# Patient Record
Sex: Female | Born: 1945 | Race: White | Hispanic: No | Smoking: Former smoker
Health system: Southern US, Community
[De-identification: ages and names within clinical notes are randomized; demographics above are authoritative.]

## PROBLEM LIST (undated history)

## (undated) DIAGNOSIS — R9439 Abnormal result of other cardiovascular function study: Secondary | ICD-10-CM

## (undated) DIAGNOSIS — I5022 Chronic systolic (congestive) heart failure: Secondary | ICD-10-CM

## (undated) DIAGNOSIS — R404 Transient alteration of awareness: Secondary | ICD-10-CM

## (undated) DIAGNOSIS — I639 Cerebral infarction, unspecified: Secondary | ICD-10-CM

## (undated) DIAGNOSIS — J40 Bronchitis, not specified as acute or chronic: Secondary | ICD-10-CM

## (undated) DIAGNOSIS — M1990 Unspecified osteoarthritis, unspecified site: Secondary | ICD-10-CM

## (undated) DIAGNOSIS — E785 Hyperlipidemia, unspecified: Secondary | ICD-10-CM

## (undated) DIAGNOSIS — F329 Major depressive disorder, single episode, unspecified: Secondary | ICD-10-CM

## (undated) DIAGNOSIS — F32A Depression, unspecified: Secondary | ICD-10-CM

## (undated) DIAGNOSIS — I2510 Atherosclerotic heart disease of native coronary artery without angina pectoris: Secondary | ICD-10-CM

## (undated) DIAGNOSIS — A809 Acute poliomyelitis, unspecified: Secondary | ICD-10-CM

## (undated) DIAGNOSIS — T4145XA Adverse effect of unspecified anesthetic, initial encounter: Secondary | ICD-10-CM

## (undated) DIAGNOSIS — I10 Essential (primary) hypertension: Secondary | ICD-10-CM

## (undated) DIAGNOSIS — G8929 Other chronic pain: Secondary | ICD-10-CM

## (undated) DIAGNOSIS — J189 Pneumonia, unspecified organism: Secondary | ICD-10-CM

## (undated) DIAGNOSIS — R0902 Hypoxemia: Secondary | ICD-10-CM

## (undated) DIAGNOSIS — L0390 Cellulitis, unspecified: Secondary | ICD-10-CM

## (undated) DIAGNOSIS — I219 Acute myocardial infarction, unspecified: Secondary | ICD-10-CM

## (undated) DIAGNOSIS — I509 Heart failure, unspecified: Secondary | ICD-10-CM

## (undated) DIAGNOSIS — IMO0001 Reserved for inherently not codable concepts without codable children: Secondary | ICD-10-CM

## (undated) DIAGNOSIS — K219 Gastro-esophageal reflux disease without esophagitis: Secondary | ICD-10-CM

## (undated) DIAGNOSIS — M549 Dorsalgia, unspecified: Secondary | ICD-10-CM

## (undated) DIAGNOSIS — M792 Neuralgia and neuritis, unspecified: Secondary | ICD-10-CM

## (undated) DIAGNOSIS — T8859XA Other complications of anesthesia, initial encounter: Secondary | ICD-10-CM

## (undated) DIAGNOSIS — I209 Angina pectoris, unspecified: Secondary | ICD-10-CM

## (undated) DIAGNOSIS — I739 Peripheral vascular disease, unspecified: Secondary | ICD-10-CM

## (undated) DIAGNOSIS — N189 Chronic kidney disease, unspecified: Secondary | ICD-10-CM

## (undated) DIAGNOSIS — R0602 Shortness of breath: Secondary | ICD-10-CM

## (undated) HISTORY — PX: DEBRIDEMENT SKIN / SQ / MUSCLE OF TRUNK: SUR393

## (undated) HISTORY — PX: CORONARY ARTERY BYPASS GRAFT: SHX141

## (undated) HISTORY — PX: CYSTECTOMY: SUR359

## (undated) HISTORY — PX: OTHER SURGICAL HISTORY: SHX169

## (undated) HISTORY — PX: PARTIAL HIP ARTHROPLASTY: SHX733

## (undated) HISTORY — PX: JOINT REPLACEMENT: SHX530

---

## 1992-05-07 DIAGNOSIS — I219 Acute myocardial infarction, unspecified: Secondary | ICD-10-CM

## 1992-05-07 HISTORY — DX: Acute myocardial infarction, unspecified: I21.9

## 2000-01-29 ENCOUNTER — Encounter: Payer: Self-pay | Admitting: Specialist

## 2000-01-29 ENCOUNTER — Encounter: Admission: RE | Admit: 2000-01-29 | Discharge: 2000-01-29 | Payer: Self-pay | Admitting: Specialist

## 2001-09-16 ENCOUNTER — Ambulatory Visit (HOSPITAL_BASED_OUTPATIENT_CLINIC_OR_DEPARTMENT_OTHER): Admission: RE | Admit: 2001-09-16 | Discharge: 2001-09-16 | Payer: Self-pay | Admitting: General Surgery

## 2001-09-16 ENCOUNTER — Encounter (INDEPENDENT_AMBULATORY_CARE_PROVIDER_SITE_OTHER): Payer: Self-pay | Admitting: Specialist

## 2002-07-22 ENCOUNTER — Emergency Department (HOSPITAL_COMMUNITY): Admission: EM | Admit: 2002-07-22 | Discharge: 2002-07-22 | Payer: Self-pay | Admitting: Emergency Medicine

## 2002-07-22 ENCOUNTER — Encounter: Payer: Self-pay | Admitting: Emergency Medicine

## 2003-04-08 ENCOUNTER — Emergency Department (HOSPITAL_COMMUNITY): Admission: EM | Admit: 2003-04-08 | Discharge: 2003-04-09 | Payer: Self-pay | Admitting: Emergency Medicine

## 2004-06-20 ENCOUNTER — Emergency Department (HOSPITAL_COMMUNITY): Admission: EM | Admit: 2004-06-20 | Discharge: 2004-06-21 | Payer: Self-pay | Admitting: Emergency Medicine

## 2004-07-17 ENCOUNTER — Emergency Department (HOSPITAL_COMMUNITY): Admission: EM | Admit: 2004-07-17 | Discharge: 2004-07-17 | Payer: Self-pay | Admitting: Emergency Medicine

## 2004-10-18 ENCOUNTER — Encounter: Admission: RE | Admit: 2004-10-18 | Discharge: 2004-10-18 | Payer: Self-pay | Admitting: Specialist

## 2004-10-30 ENCOUNTER — Encounter: Admission: RE | Admit: 2004-10-30 | Discharge: 2004-10-30 | Payer: Self-pay | Admitting: Orthopedic Surgery

## 2004-11-06 ENCOUNTER — Encounter: Admission: RE | Admit: 2004-11-06 | Discharge: 2004-11-06 | Payer: Self-pay | Admitting: Orthopedic Surgery

## 2004-11-14 ENCOUNTER — Ambulatory Visit (HOSPITAL_COMMUNITY): Admission: RE | Admit: 2004-11-14 | Discharge: 2004-11-14 | Payer: Self-pay | Admitting: Orthopedic Surgery

## 2005-07-04 ENCOUNTER — Inpatient Hospital Stay (HOSPITAL_BASED_OUTPATIENT_CLINIC_OR_DEPARTMENT_OTHER): Admission: RE | Admit: 2005-07-04 | Discharge: 2005-07-04 | Payer: Self-pay | Admitting: Cardiovascular Disease

## 2005-09-03 ENCOUNTER — Ambulatory Visit (HOSPITAL_COMMUNITY)
Admission: RE | Admit: 2005-09-03 | Discharge: 2005-09-05 | Payer: Self-pay | Admitting: Thoracic Surgery (Cardiothoracic Vascular Surgery)

## 2005-10-17 ENCOUNTER — Ambulatory Visit
Admission: RE | Admit: 2005-10-17 | Discharge: 2005-10-17 | Payer: Self-pay | Admitting: Thoracic Surgery (Cardiothoracic Vascular Surgery)

## 2005-12-03 ENCOUNTER — Encounter
Admission: RE | Admit: 2005-12-03 | Discharge: 2005-12-03 | Payer: Self-pay | Admitting: Thoracic Surgery (Cardiothoracic Vascular Surgery)

## 2005-12-04 ENCOUNTER — Inpatient Hospital Stay (HOSPITAL_COMMUNITY)
Admission: RE | Admit: 2005-12-04 | Discharge: 2005-12-08 | Payer: Self-pay | Admitting: Thoracic Surgery (Cardiothoracic Vascular Surgery)

## 2006-01-01 ENCOUNTER — Inpatient Hospital Stay (HOSPITAL_COMMUNITY): Admission: AD | Admit: 2006-01-01 | Discharge: 2006-01-18 | Payer: Self-pay | Admitting: Surgery

## 2006-01-08 ENCOUNTER — Ambulatory Visit: Payer: Self-pay | Admitting: Infectious Diseases

## 2006-02-06 ENCOUNTER — Inpatient Hospital Stay (HOSPITAL_COMMUNITY): Admission: EM | Admit: 2006-02-06 | Discharge: 2006-02-10 | Payer: Self-pay | Admitting: Emergency Medicine

## 2006-02-07 ENCOUNTER — Encounter: Payer: Self-pay | Admitting: Vascular Surgery

## 2006-02-07 ENCOUNTER — Encounter: Payer: Self-pay | Admitting: Cardiovascular Disease

## 2009-03-22 ENCOUNTER — Encounter: Admission: RE | Admit: 2009-03-22 | Discharge: 2009-03-22 | Payer: Self-pay | Admitting: Sports Medicine

## 2009-07-05 ENCOUNTER — Encounter: Admission: RE | Admit: 2009-07-05 | Discharge: 2009-07-05 | Payer: Self-pay | Admitting: Sports Medicine

## 2010-01-19 ENCOUNTER — Ambulatory Visit: Payer: Self-pay | Admitting: Cardiovascular Disease

## 2010-01-30 ENCOUNTER — Inpatient Hospital Stay (HOSPITAL_COMMUNITY): Admission: RE | Admit: 2010-01-30 | Discharge: 2010-02-04 | Payer: Self-pay | Admitting: Specialist

## 2010-05-27 ENCOUNTER — Encounter: Payer: Self-pay | Admitting: Physical Therapy

## 2010-05-28 ENCOUNTER — Encounter: Payer: Self-pay | Admitting: Thoracic Surgery (Cardiothoracic Vascular Surgery)

## 2010-05-28 ENCOUNTER — Encounter: Payer: Self-pay | Admitting: Physical Therapy

## 2010-07-19 LAB — GLUCOSE, CAPILLARY: Glucose-Capillary: 223 mg/dL — ABNORMAL HIGH (ref 70–99)

## 2010-07-19 LAB — PROTIME-INR
INR: 2.04 — ABNORMAL HIGH (ref 0.00–1.49)
Prothrombin Time: 23.2 seconds — ABNORMAL HIGH (ref 11.6–15.2)

## 2010-07-20 LAB — URINALYSIS, ROUTINE W REFLEX MICROSCOPIC
Bilirubin Urine: NEGATIVE
Glucose, UA: NEGATIVE mg/dL
Glucose, UA: NEGATIVE mg/dL
Hgb urine dipstick: NEGATIVE
Ketones, ur: NEGATIVE mg/dL
Leukocytes, UA: NEGATIVE
Leukocytes, UA: NEGATIVE
Nitrite: NEGATIVE
Nitrite: NEGATIVE
Protein, ur: NEGATIVE mg/dL
Specific Gravity, Urine: 1.006 (ref 1.005–1.030)
Specific Gravity, Urine: 1.007 (ref 1.005–1.030)
Specific Gravity, Urine: 1.009 (ref 1.005–1.030)
Urobilinogen, UA: 0.2 mg/dL (ref 0.0–1.0)
Urobilinogen, UA: 0.2 mg/dL (ref 0.0–1.0)
pH: 5 (ref 5.0–8.0)

## 2010-07-20 LAB — PROTIME-INR
INR: 0.99 (ref 0.00–1.49)
INR: 1.44 (ref 0.00–1.49)
INR: 2.21 — ABNORMAL HIGH (ref 0.00–1.49)
Prothrombin Time: 13.3 seconds (ref 11.6–15.2)
Prothrombin Time: 24.7 seconds — ABNORMAL HIGH (ref 11.6–15.2)

## 2010-07-20 LAB — URINE MICROSCOPIC-ADD ON

## 2010-07-20 LAB — URIC ACID: Uric Acid, Serum: 4.2 mg/dL (ref 2.4–7.0)

## 2010-07-20 LAB — CBC
HCT: 33.4 % — ABNORMAL LOW (ref 36.0–46.0)
HCT: 33.5 % — ABNORMAL LOW (ref 36.0–46.0)
HCT: 36.4 % (ref 36.0–46.0)
Hemoglobin: 11.1 g/dL — ABNORMAL LOW (ref 12.0–15.0)
MCH: 29.1 pg (ref 26.0–34.0)
MCH: 29.2 pg (ref 26.0–34.0)
MCH: 30 pg (ref 26.0–34.0)
MCHC: 34.8 g/dL (ref 30.0–36.0)
MCV: 86.6 fL (ref 78.0–100.0)
MCV: 89 fL (ref 78.0–100.0)
Platelets: 165 10*3/uL (ref 150–400)
Platelets: 190 10*3/uL (ref 150–400)
RBC: 3.87 MIL/uL (ref 3.87–5.11)
RDW: 13.4 % (ref 11.5–15.5)
RDW: 14 % (ref 11.5–15.5)
WBC: 13.3 10*3/uL — ABNORMAL HIGH (ref 4.0–10.5)
WBC: 8.1 10*3/uL (ref 4.0–10.5)

## 2010-07-20 LAB — GLUCOSE, CAPILLARY
Glucose-Capillary: 103 mg/dL — ABNORMAL HIGH (ref 70–99)
Glucose-Capillary: 103 mg/dL — ABNORMAL HIGH (ref 70–99)
Glucose-Capillary: 114 mg/dL — ABNORMAL HIGH (ref 70–99)
Glucose-Capillary: 115 mg/dL — ABNORMAL HIGH (ref 70–99)
Glucose-Capillary: 141 mg/dL — ABNORMAL HIGH (ref 70–99)
Glucose-Capillary: 182 mg/dL — ABNORMAL HIGH (ref 70–99)
Glucose-Capillary: 194 mg/dL — ABNORMAL HIGH (ref 70–99)
Glucose-Capillary: 218 mg/dL — ABNORMAL HIGH (ref 70–99)
Glucose-Capillary: 273 mg/dL — ABNORMAL HIGH (ref 70–99)
Glucose-Capillary: 89 mg/dL (ref 70–99)

## 2010-07-20 LAB — BASIC METABOLIC PANEL
CO2: 24 mEq/L (ref 19–32)
CO2: 28 mEq/L (ref 19–32)
CO2: 29 mEq/L (ref 19–32)
Calcium: 8.1 mg/dL — ABNORMAL LOW (ref 8.4–10.5)
Calcium: 8.4 mg/dL (ref 8.4–10.5)
Calcium: 8.4 mg/dL (ref 8.4–10.5)
Creatinine, Ser: 0.9 mg/dL (ref 0.4–1.2)
Creatinine, Ser: 0.97 mg/dL (ref 0.4–1.2)
Creatinine, Ser: 1.08 mg/dL (ref 0.4–1.2)
GFR calc Af Amer: >60 mL/min (ref >60)
GFR calc non Af Amer: 58 mL/min — ABNORMAL LOW (ref >60)
Glucose, Bld: 91 mg/dL (ref 70–99)
Sodium: 136 mEq/L (ref 135–145)

## 2010-07-20 LAB — DIFFERENTIAL
Basophils Absolute: 0 10*3/uL (ref 0.0–0.1)
Eosinophils Relative: 2 % (ref 0–5)
Eosinophils Relative: 2 % (ref 0–5)
Lymphocytes Relative: 21 % (ref 12–46)
Lymphocytes Relative: 27 % (ref 12–46)
Lymphs Abs: 1.7 10*3/uL (ref 0.7–4.0)
Lymphs Abs: 2.5 10*3/uL (ref 0.7–4.0)
Monocytes Absolute: 0.7 10*3/uL (ref 0.1–1.0)
Neutro Abs: 5.5 10*3/uL (ref 1.7–7.7)

## 2010-07-20 LAB — URINE CULTURE

## 2010-07-20 LAB — HEMOGLOBIN A1C: Mean Plasma Glucose: 157 mg/dL — ABNORMAL HIGH (ref <117)

## 2010-07-20 LAB — COMPREHENSIVE METABOLIC PANEL
ALT: 16 U/L (ref 0–35)
Calcium: 9.3 mg/dL (ref 8.4–10.5)
Glucose, Bld: 197 mg/dL — ABNORMAL HIGH (ref 70–99)
Sodium: 138 mEq/L (ref 135–145)
Total Protein: 7 g/dL (ref 6.0–8.3)

## 2010-07-20 LAB — SURGICAL PCR SCREEN: MRSA, PCR: NEGATIVE

## 2010-07-26 NOTE — Discharge Summary (Signed)
NAMEJAHARI, [name]               ACCOUNT NO.:  [id number]  MEDICAL RECORD NO.:  [id number]          PATIENT TYPE:  INP  LOCATION:  5001                         FACILITY:  [facility]  PHYSICIAN:  Kerrin Champagne, M.D.   DATE OF BIRTH:  31-Aug-1945  DATE OF ADMISSION:  01/30/2010 DATE OF DISCHARGE:  02/04/2010                              DISCHARGE SUMMARY   ADMISSION DIAGNOSES: 1. Osteoarthritis of the right hip. 2. Hypertension. 3. Coronary artery bypass graft in 2007, followed by staphylococcus     infection and Escherichia coli infection. 4. Chronic obstructive pulmonary disease. 5. Diabetes mellitus.  DISCHARGE DIAGNOSES: 1. Osteoarthritis of the right hip. 2. Hypertension. 3. Coronary artery bypass graft in 2007, followed by staphylococcus     infection and Escherichia coli infection. 4. Chronic obstructive pulmonary disease. 5. Diabetes mellitus. 6. Mild posthemorrhagic anemia.  PROCEDURE:  On January 30, 2010, the patient underwent right total hip arthroplasty using DePuy components.  This performed by Dr. Otelia Sergeant, assisted by Maud Deed, [facility], under general anesthesia.  CONSULTATIONS:  None.  BRIEF HISTORY:  The patient is a 65 year old female who was initially referred to Dr. Otelia Sergeant for concerns regarding her low back pain.  Upon examination, she was noted to have a severe antalgic gait with radiographs demonstrating severe osteoarthritis of the right hip.  She was noted to have severe limitations of range of motion of the right hip and a slight flexion contracture of about 10 degrees.  It was felt that she would benefit from surgical intervention and was admitted for the procedure as stated above.  BRIEF HOSPITAL COURSE:  The patient tolerated the procedure under general anesthesia without complications.  Postoperatively, neurovascular motor function of the lower extremities was noted to be intact.  Hemovac drain was discontinued on the first postoperative  day without difficulty.  Dressing changes were done daily thereafter and the patient's wound was healing without signs of infection.  The patient was placed on Coumadin for DVT prophylaxis.  Adjustments in Coumadin dose made according to daily pro-times by the pharmacist at [facility]. The patient did have some elevated temperatures during the hospital stay.  She was checked with chest x-ray, which showed no acute disease. The patient also underwent urinalysis and urine culture and sensitivity. Urinalysis was negative and the culture did have a final finding of 45,000 colonies of E-coli.  The patient was treated with the usual perioperative antibiotics including Ancef.  PCA medications were used initially for pain control.  The patient did become of over-narcotized and required a dose of Narcan.  Following this, she returned to her normal mental status and was able to be weaned to p.o. analgesics without difficulty.  The patient complained of left shoulder pain.  She was noted to have mild discomfort with range of motion.  Radiographs were negative for fracture.  It was felt that this could possibly be related to gout.  Dr. Otelia Sergeant started the patient on Medrol Dosepak on February 03, 2010.  Her uric acid level was within normal limits.  The patient progressed well with physical therapy.  She was able to ambulate in the hallway,  weightbearing as tolerated.  The patient was taught total hip replacement precautions and was able to demonstrate ability to follow these precautions safely.  She was ambulating over 100 feet in the hallway prior to discharge.  The patient was using a walker.  She received occupational therapy for ADLs and was independent with ADLs prior to discharge.  Other pertinent laboratory values:  Hemoglobin and hematocrit dropped to 11.2 and 33.5 respectively.  Preoperative hemoglobin and hematocrit 14.1 and 40.5.  INR at discharge 2.04. Chemistry studies within normal  limits with the exception of mildly elevated glucoses.  One episode of hyponatremia at 132.  Hemoglobin A1c was 7.1.  Urinalysis negative for urinary tract infection on admission, and on September 27, urinalysis showed 0-2 wbc's, 0-2 rbc's, few bacteria, hyaline cast, granular cast, and mucus present.  PLAN:  The patient was discharged to her home.  Arrangement was made for home health physical therapy for range of motion, stretching/strengthening exercises as well as ambulation.  She will change her dressing daily or as needed.  She will continue on a low- modified carbohydrate diet.  She will be allowed to shower as long as there is no wound drainage.  The patient is instructed to follow up in the office 2 weeks from the date of surgery.  She will continue to follow total hip replacement precautions and will be weightbearing as tolerated on the operative extremity.  The patient was given a medication reconciliation form with instructions to continue her medications as taken prior to admission.  She was given prescription for Percocet 5/325 one-two every 4-6 hours as needed for pain, Coumadin per pharmacy protocol, Medrol Dosepak which she will complete, Robaxin 500 mg one every 8 hours as needed for spasm.  CONDITION ON DISCHARGE:  Stable.  All questions encouraged and answered prior to discharge.     Wende Neighbors, P.A.   ______________________________ Kerrin Champagne, M.D.    SMV/MEDQ  D:  07/11/2010  T:  07/12/2010  Job:  [id number]  Electronically Signed by Maud Deed P.A. on 07/13/2010 09:18:28 AM Electronically Signed by Vira Browns M.D. on 07/26/2010 11:19:39 PM

## 2010-08-17 ENCOUNTER — Inpatient Hospital Stay (INDEPENDENT_AMBULATORY_CARE_PROVIDER_SITE_OTHER)
Admission: RE | Admit: 2010-08-17 | Discharge: 2010-08-17 | Disposition: A | Discharge status: Home / Self Care | Payer: 59 | Source: Ambulatory Visit | Attending: Emergency Medicine | Admitting: Emergency Medicine

## 2010-08-17 DIAGNOSIS — J449 Chronic obstructive pulmonary disease, unspecified: Secondary | ICD-10-CM

## 2010-08-17 LAB — POCT I-STAT, CHEM 8
Chloride: 96 mEq/L (ref 96–112)
Glucose, Bld: 563 mg/dL (ref 70–99)
HCT: 43 % (ref 36.0–46.0)
Hemoglobin: 14.6 g/dL (ref 12.0–15.0)
Potassium: 4.2 mEq/L (ref 3.5–5.1)
Sodium: 132 mEq/L — ABNORMAL LOW (ref 135–145)

## 2010-08-17 LAB — GLUCOSE, CAPILLARY

## 2010-09-22 NOTE — Op Note (Signed)
NAME:  [name], [name]                         ACCOUNT NO.:  [id number]   MEDICAL RECORD NO.:  [id number]                   PATIENT TYPE:  EMS   LOCATION:  MINO                                 FACILITY:  [facility]   PHYSICIAN:  Dionne Ano. Everlene Other, M.D.         DATE OF BIRTH:  29-Jun-1945   DATE OF PROCEDURE:  07/22/2002  DATE OF DISCHARGE:                                 OPERATIVE REPORT   CLINICAL NOTE:  The patient is a very pleasant 65 year old female who was  kindly referred for evaluation of the left thumb.  This patient sustained an  injury when she slammed the left thumb into her Zenaida Niece door at 7:30 a.m. today.  The patient was seen at the [facility] emergency room.  Following  evaluation, she was kindly referred for care.  She has an open distal  phalanx fracture and nail bed laceration and partially-avulsed nail at the  left thumb tip.  Her right thumb is nontender.   She has been given a tetanus shot today.  The patient has a host of  allergies, which we have reviewed, and will place her on a Z-Pak for  antibiotic prophylaxis against infection.   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   PAST SURGICAL HISTORY:  Angioplasty x3.   FAMILY HISTORY:  Heart disease and CVA.   MEDICATIONS:  Aspirin daily.   ALLERGIES:  SULFA, PENICILLIN, and CODEINE.   REVIEW OF SYSTEMS:  Negative.   PHYSICAL EXAMINATION:  GENERAL:  A very pleasant female in no acute  distress.  EXTREMITIES:  The patient has a distal phalanx fracture about the left  thumb.  This is an open fracture with nail bed injury and partial avulsion  of the nail.  The patient's index through small fingers are intact without  signs of infection or distal neurovascular compromise.  The right upper  extremity is neurovascularly intact with normal alignment, stability, and  range of motion.  Lower extremity exam is benign.  HEENT:  Within  normal limits.  CHEST:  Clear.  ABDOMEN:  Nontender, nondistended.   SOCIAL HISTORY:  The patient is divorced with four children.  She is on  disability.  She smokes 1-1/2 packs per day and has done so x30-40 years.  She does not drink alcohol.   LABORATORY DATA:  X-ray showed distal phalanx fracture as well as a middle  phalanx condylar fracture.   IMPRESSION:  Open fracture, left thumb, with nail bed injury.   PLAN:  I verbally consented her for I&D and repair of structures as  necessary.   DESCRIPTION OF PROCEDURE:  The patient was seen by myself and Karie Chimera, P.A.-C.  She then underwent a thorough prep and drape.  Dr. Read Drivers  previously instituted a digital block.  Once this was done, the patient had  a sterile field secured and she underwent I&D of an  open fracture.  This was  an I&D of the skin, subcutaneous tissue, bone, nail bed tissue, and  nonviable prenecrotic tissue.  This was an excisional debridement of the  open fracture.  Following this, the open fracture was reduced and placed in  alignment.  This was done to my satisfaction without difficulty.  Following  this the patient underwent sequential nail bed repair with 6-0 chromic  suture.  The lateral nail folds were repaired with 4-0 chromic.  The patient  tolerated this well, and this was done under tourniquet.  After tourniquet  deflation, the patient had instant refill.  There were no complications.   She was dressed sterilely, Adaptic was placed under the eponychial fold to  prevent nail bed adherence, and the patient tolerated the procedure well.  She was discharged home on a Z-Pak.  She was also given Mepergan Fortis for  pain and Robaxin for muscle spasm.  She will return to see [place] in seven days  in the office, at which time an IP splint will be made, MCP range of motion  will be begun.  She will see [place] back in two weeks for wound check.                                               Dionne Ano. Everlene Other, M.D.    Nash Mantis  D:  07/22/2002  T:  07/23/2002  Job:  [id number]   cc:   Paula Libra, M.D.

## 2010-09-22 NOTE — H&P (Signed)
NAMECATHEY, [name]               ACCOUNT NO.:  [id number]   MEDICAL RECORD NO.:  [id number]            PATIENT TYPE:   LOCATION:                                 FACILITY:   PHYSICIAN:  Theda Belfast, PA DATE OF BIRTH:  10/23/1945   DATE OF ADMISSION:  DATE OF DISCHARGE:                                HISTORY & PHYSICAL   CHIEF COMPLAINT:  Sternal wound infection.   HISTORY OF PRESENT ILLNESS:  Patient is a 65 year old Caucasian female who  is recently status post coronary artery bypass grafting x 4 which was done  by [name] on December 04, 2005.  The patient states she was discharged to home  postoperative day December 08, 2005.  Patient had a pretty minimal  postoperative course.  Today patient states about 10 days ago she noticed  odor coming from her distal sternal wound.  Over the past 2-3 days her  granddaughter had noticed her distal sternal wound had started to separate.  She stated that she had an appointment with her cardiologist and waited  until today, January 01, 2006, for evaluation.  She presented to her  cardiologist who then referred her here to Dr. Sharee Pimple office.  She denies  any fevers, night sweats, chills, purulent drainage from wound site, chest  pain or shortness of breath.   PAST MEDICAL HISTORY:  1. Coronary artery disease.  2. Hypertension.  3. Hyperlipidemia.  4. Diabetes mellitus type 2.  5. Chronic recurrent bronchitis.  6. Longstanding tobacco abuse.  7. Depression.  8. Anxiety.  9. History of cerebrovascular accident.  Questionable.  10.Questionable cervical disk disease, nerve root compression.  11.Degenerative arthritis with chronic right hip and low back pain.  12.Gastroesophageal reflux disease.   PAST SURGICAL HISTORY:  1. Coronary artery bypass grafting x 4 by [name] December 04, 2005.   ALLERGIES:  PENICILLIN, SULFA, CODEINE.   MEDICATIONS:  1. Protonix 40 mg daily.  2. Albuterol inhaler p.r.n.  3. Aspirin 325 mg daily.  4.  Metoprolol 50 mg twice daily.  5. Lisinopril 10 mg daily.  6. Crestor 10 mg at night.  7. Xanax 0.25 mg daily.  8. Glucophage 500 mg.  9. Humalog 75/25 16 units a.m., 6 units noon, 18 units p.m.  10.Lasix 40 mg daily.  11.Potassium chloride 20 mEq daily.  12.Folic acid 1 mg daily.   SOCIAL HISTORY:  The patient has three children, lives at home with her  family.  She has a history of tobacco abuse which she quit about 6 to 8  weeks ago.  The patient resides in [place].   FAMILY HISTORY:  Noncontributory.   REVIEW OF SYSTEMS:  See HPI for pertinent positives and negatives.  Otherwise, patient denies any recent fevers, night sweats or chills.  Denies  any recent changes in vision, hearing, difficulty swallowing.  Denies any  shortness of breath, cough, hemoptysis, wheezing.  Denies any chest pain,  palpitations, __________ dizziness, orthopnea, paroxysmal nocturnal dyspnea.  Denies any nausea, vomiting, abdominal pain, changes in bowel movements,  diarrhea, constipation, melena, hematemesis, hematochezia.  Denies any  urgency frequency, dysuria, hematuria.  Denies any muscle weakness.  Denies  any decrease temperature lower extremities.  Denies any symptoms of TA or  CVA such as amaurosis fugax, muscle weakness, dysphagia, difficulty with  slurred speech.   PHYSICAL EXAMINATION:  GENERAL:  Anxious white female.  VITAL SIGNS:  Blood pressure 162/80, pulse of 76, respiratory rate 18,  temperature 98.3.  HEENT:  Normocephalic and atraumatic.  Pupils equal, round and reactive to  light and accommodation.  Extraocular movements are intact.  Oral mucosa  pink and moist.  NECK:  Supple.  RESPIRATORY:  Clear to auscultation bilaterally.  CARDIAC:  Regular rate and rhythm.  Patient does have a distal sternal wound  dehiscence done to the sutures.  There is purulent drainage noted.  No  cellulitis noted.  This measures about 3 x 2 cm.  ABDOMEN:  Bowel sounds x 4.  Soft and nontender on  palpation.  GENITOURINARY: Deferred.  RECTAL:  Deferred.  EXTREMITIES:  The patient has positive 2+ pitting edema noted lower  extremities.  Bilateral extremities are cool to touch.  She has 2+ bilateral  radial pulses noted.  1+ bilateral femoral and dorsalis pedis and posterior  tibial pulses noted.  Note leg incisions are healing well.  No ulcerations  or skin break down noted.  NEUROLOGIC:  Cranial nerves 2 through 12 are intact.  Patient is alert and  oriented x4.  Gait steady.  Muscle strength 5/5 lower extremities  bilaterally.   IMPRESSION AND PLAN:  The patient is seen with a superficial sternal wound  infection.  She will be admitted to [facility] and started on IV  vancomycin with p.o. ciprofloxacin.  Will check CT scan without contrast for  evaluation of her sternal wound.  Pending CT scan possible debridement with  wound VAC placement tomorrow, January 02, 2006 by Dr. Laneta Simmers.  This was  discussed with the patient.  She acknowledged her understanding and agreed.      Theda Belfast, PA     KMD/MEDQ  D:  01/01/2006  T:  01/01/2006  Job:  [id number]

## 2010-09-22 NOTE — Discharge Summary (Signed)
[name], [name]               ACCOUNT NO.:  [id number]   MEDICAL RECORD NO.:  [id number]           PATIENT TYPE:   LOCATION:                                 FACILITY:   PHYSICIAN:  [name]. [name], M.D. DATE OF BIRTH:  [birth year]   DATE OF ADMISSION:  DATE OF DISCHARGE:                                 DISCHARGE SUMMARY   PRIMARY DIAGNOSIS:  Severe three-vessel coronary artery disease.   IN HOSPITAL DIAGNOSES:  1.  Volume overload.  2.  Bilateral pulmonary effusions.   SECONDARY DIAGNOSES:  1.  History of coronary artery disease with a history of acute myocardial      infarction status post percutaneous coronary intervention.  2.  Hypertension.  3.  Hyperlipidemia.  4.  Diabetes mellitus type 2.  5.  Chronic recurrent bronchitis.  6.  Longstanding tobacco abuse.  7.  Depression.  8.  Anxiety.  9.  Questionable history of previous stroke.  10. Question history of cervical disk disease and/or nerve root compression.  11. Degenerative arthritis with chronic right hip and low back pain.  12. Gastroesophageal reflux disease.   ALLERGIES:  PENICILLIN, SULFA, CODEINE.   IN HOSPITAL OPERATIONS AND PROCEDURES:  1.  Coronary artery bypass grafting x4 using a saphenous vein graft to      distal left anterior descending coronary artery, left internal mammary      artery to ramus intermediate branch, saphenous vein graft to second      circumflex marginal branch, saphenous vein graft to distal right      coronary artery.  Endoscopic saphenous vein harvest from bilateral      thighs.  2.  Intraoperative transesophageal echocardiogram.   HISTORY AND HOSPITAL COURSE:  The patient is a 65 year old obese white  female with history of coronary artery disease status post percutaneous  coronary intervention. She also has a history of hypertension,  hyperlipidemia, diabetes mellitus type 2, longstanding history of tobacco  abuse. She presented with symptoms consistent with angina  pectoris. The  patient underwent cardiac catheterization by [name] demonstrating severe  three-vessel coronary artery disease with mild left ventricular dysfunction  and coronary anatomy unfavorable for percutaneous coronary intervention.  Following catheterization [name] was consulted. [name] saw and evaluated  the patient. He discussed with the patient undergoing coronary artery bypass  grafting. The patient acknowledged understanding and agreed to proceed.  Surgery was scheduled for July31,2007.  Please see dictated H&P for further  History and Physical exam.   The patient was taken to the operating room July31,2007 where she underwent  coronary bypass grafting x4 using saphenous vein graft to distal left  anterior descending coronary artery, left internal mammary artery to ramus  intermediate branch, saphenous vein graft to second circumflex marginal  branch, saphenous vein graft to distal right coronary artery. Endoscopic  saphenous vein harvesting done from bilateral thighs. The patient tolerated  this procedure well and was transferred up to the intensive care unit in  stable condition. Immediately following surgery the patient was seen to be  hemodynamically stable. Later that evening she was extubated and  seen to be  alert and oriented x3.  Neurological intact following extubation. The  patient's postoperative course was pretty much unremarkable. Chest tubes and  lines were DC'd in a normal fashion. She was able to be weaned off dopamine  postop day #1. Systolic blood pressure remained stable. The patient was  transferred out of the intensive care unit on postop day #2. During the  patient's postoperative course she did develop bilateral pulmonary effusions  as well as volume overload. She was started on diuretics at that time.  Follow-up chest x-rays were obtained. Possibly may need thoracentesis if  pulmonary effusions remain. Follow-up chest x-ray will be obtained prior  to  discharge home. The patient was out of bed ambulating well postoperatively.  She was in normal sinus rhythm. Remained in normal sinus rhythm during her  postoperative course. Vital signs are monitored and seen to be stable. She  is afebrile during her postoperative course. The patient was able to be  weaned off oxygen sating greater than 90% on room air with aggressive  pulmonary toilet. The patient remained hemodynamically stable with last  hemoglobin and hematocrit on postop day #3 being 9.9 and 20.5. The patient  does have a history of diabetes mellitus type 2. Postoperatively  blood  sugars were elevated. She was restarted on her Glucophage and Humalog. The  blood sugars will be monitored, and Humalog may need to be increased prior  to discharge if blood sugars remained elevated. The patient's incisions were  clean, dry, intact, and healing well. She was tolerating regular diet well.  No nausea or vomiting noted.   The patient is tentatively ready for discharge home in the next 1-2 days. A  follow-up appointment has been arranged with [name] for September10,2007  at 11:15 a.m. The patient will need to contact Dr. [name] office, and  schedule follow-up appointment with him in two weeks. She will obtain a PA  and lateral chest x-ray at this point. [name] received instructions on  diet, activity level and incisional care.  She was told no driving, no  strenuous activity, no heavy lifting over 10 pounds. The patient is told to  ambulate 3-4 times per day, progress as tolerated. Continue breathing  exercises.   DIET:  Low fat, low salt.   INSTRUCTIONS:  Incisional care, wash incisions with soap and water. Contact  office if she develops any drainage or opening from incision sites.   DISCHARGE MEDICATIONS:  1.  Protonix 40 mg daily.  2.  Albuterol inhaler as needed.  3.  Aspirin 325 mg daily.  4.  Metoprolol 25 mg b.i.d.. 5.  Lisinopril 10 mg daily.  6.  Crestor 10 mg  at night.  7.  Xanax 0.25 mg as used at home.  8.  Glucophage 500 mg b.i.d.  9.  Humalog 75/25 16 units a.m., 16 units at noon, and 18 units p.m..  10. Lasix 40 mg daily times x7 days.  11. Potassium chloride 20 mEq daily x7 days.  12. Folic acid 1 mg daily.  13. Ultram 50 mg 1-2 tablets q. 4-6 hours p.r.n. pain.      Theda Belfast, PA      [name]. [name], M.D.  Electronically Signed    KMD/MEDQ  D:  12/07/2005  T:  12/07/2005  Job:  [id number]

## 2010-09-22 NOTE — H&P (Signed)
NAMEGIANNINA, [name]               ACCOUNT NO.:  [id number]   MEDICAL RECORD NO.:  [id number]          PATIENT TYPE:  OBV   LOCATION:  2015                         FACILITY:  [facility]   PHYSICIAN:  Ulyses Amor, MD DATE OF BIRTH:  March 18, 1946   DATE OF ADMISSION:  02/06/2006  DATE OF DISCHARGE:                                HISTORY & PHYSICAL   HISTORY OF PRESENT ILLNESS:  [name] is a 65 year old white woman who  is admitted to [facility] for what appears to an exacerbation of  congestive heart failure.   The patient has a history of cardiac disease, having undergone coronary  artery bypass surgery in July of this year.  She was found to have three  vessel coronary artery disease and mild left ventricular dysfunction with an  ejection fraction of 45-50%.  Her course was complicated by sternal wound  infection which has required debridement and a PICC-line with antibiotics  which continue to this day.  Her wound is reportedly nearly healed.  The  patient presented to the emergency department with a one day history of  dyspnea.  This has been associated with swelling of both legs.  She has  noted a 12 pound weight gain in recent weeks.  There has been no chest pain,  pressure, heaviness or squeezing.  She has been taking her medications as  described.   OTHER MEDICAL PROBLEMS:  1. Hypertension.  2. Dyslipidemia.  3. Insulin-dependent diabetes mellitus.  4. Chronic obstructive pulmonary disease with recurrent bronchitis.  5. Depression with anxiety.  6. Gastroesophageal reflux.  7. Possible prior cerebral vascular accident.   MEDICATIONS:  1. Aspirin.  2. Ciprofloxacin.  3. Darvocet N-100.  4. Insulin.  5. Lisinopril.  6. Metformin.  7. Metoprolol.  8. Protonix.  9. She is also on a number of medications administered through her PICC-      line, but is unable to specify them at this time.   ALLERGIES:  INTRAVENOUS CONTRAST, CODEINE, PENICILLIN,  SULFA.   SOCIAL HISTORY:  The patient lives with her children.  She continues to  smoke one pack of cigarettes per day.  She does not use alcohol.   PAST SURGICAL HISTORY:  Coronary artery disease as described above.   FAMILY HISTORY:  Both her father and mother suffered stroke.   REVIEW OF SYSTEMS:  No new problems related to her Head, Eyes, Ears, Nose,  Mouth, Throat, Lungs, Gastrointestinal system, Genitourinary system or  Extremities.  There is no history of Neurologic or Psychiatric disorder  other than as described above.  No history of fever, chills or weight loss.   PHYSICAL EXAMINATION:  VITAL SIGNS:  Blood pressure 183/105, pulse 108 and  regular, respirations 22, temperature 97.9, pulse oximetry 92% on 2 liters.  GENERAL APPEARANCE:  The patient was a middle aged white woman in no  discomfort.  She is alert, oriented and appropriate.  HEENT:  Normal.  NECK:  Without thyromegaly or adenopathy.  Carotid pulses were palpable  bilaterally and without bruits.  CARDIAC:  Normal S1, S2.  There was no S3, S4,  murmurs, rubs or clicks.  Cardiac rhythm was regular.  No chest wall tenderness was noted.  LUNGS:  Minimal basilar crackles.  ABDOMEN:  Soft and nontender.  No mass, hepatosplenomegaly, bruits,  distention, rebound, guarding, rigidity.  Bowel sounds were normal.  BREAST/PELVIC/RECTAL:  Not performed as they were not pertinent to the  reason for acute care hospitalization.  EXTREMITIES:  Without deviation or deformity.  Mild to moderate left lower  extremity edema was noted.  Radial and dorsalis pedal pulses were palpable  bilaterally.  NEUROLOGICAL:  Brief screening neurologic survey was unremarkable.   STUDIES:  Electrocardiogram revealed sinus tachycardia.  Left atrium  enlargement was present.  A prior anteroseptal myocardial infarction was  present.  Nonspecific T wave flattenings noted in the lateral lead.  Chest  radiograph, according to the radiologist,  demonstrated cardiomegaly, mild  interstitial edema and a left pleural effusion.   White count of 10.5, hemoglobin 12.6, hematocrit 38.6.  Potassium 3.5, BUN  3, creatinine 0.8.  Initial set of cardiac markers revealed a myoglobin of  67.9, CK MB 1.7 and troponin less than 0.05.  Fibrin derivatives were 0.92.  The remaining studies were pending at the time of this dictation.   IMPRESSION:  1. Congestive heart failure plus increased left pleural effusion.  The      last ejection fraction was 45-50% by cardiac catheterization in      February 2007.  The dyspnea may be due to one or both etiologies      enumerated above.  2. Coronary artery disease:  Status post coronary artery bypass surgery      July 2007.  3. 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  4. Hypertension.  5. Dyslipidemia.  6. Insulin-dependent diabetes mellitus.  7. Recurrent bronchitis; chronic obstructive pulmonary disease.  8. Depression/anxiety.  9. Gastroesophageal reflux disease.  10.History of possible cerebrovascular accident.  11.Left leg swelling:  Rule out deep vein thrombosis (versus congestive      heart failure edema).   PLAN:  1. Telemetry.  2. Serial cardiac enzymes.  3. Aspirin.  4. Lovenox.  5. Diuresis.  6. Oxygen.  7. Daily weights, inputs and outputs.  8. Echocardiogram.  9. Maintain potassium between 4-5.  10.Discontinuation of smoking discussed.  11.Lower extremity Doppler's.  12.Further measures per [name].      Ulyses Amor, MD  Electronically Signed     MSC/MEDQ  D:  02/06/2006  T:  02/07/2006  Job:  [id number]   cc:   Vesta Mixer, M.D.

## 2010-09-22 NOTE — Op Note (Signed)
[name], [name]               ACCOUNT NO.:  [id number]   MEDICAL RECORD NO.:  [id number]          PATIENT TYPE:  INP   LOCATION:  6739                         FACILITY:  [facility]   PHYSICIAN:  Evelene Croon, M.D.     DATE OF BIRTH:  June 08, 1945   DATE OF PROCEDURE:  01/02/2006  DATE OF DISCHARGE:                                 OPERATIVE REPORT   PREOPERATIVE DIAGNOSIS:  Superficial sternal wound infection.   POSTOPERATIVE DIAGNOSIS:  Superficial sternal wound infection.   OPERATIVE PROCEDURE:  Sternal wound debridement with insertion of vac  drainage system.   SURGEON:  Evelene Croon, M.D.   ANESTHESIA:  General endotracheal.   CLINICAL HISTORY:  This patient is a 65 year old woman who underwent  coronary bypass x4 by [name] on December 04, 2005.  She is an obese diabetic.  She returned to the office yesterday reporting at least 2 days of separation  of the lower portion of her sternal wound with foul-smelling drainage.  On  examination, the wound had separated, and there was purulent material  present within the wound.  The sternum felt grossly stable.  She has had no  fever or chills.  I felt that this would require debridement in the  operating room.  She was admitted to hospital and underwent CT scan of the  chest which did not show any deep sternal wound infection.  The edges of the  sternal bone appeared approximated but not healed.  The subcutaneous tissue  appeared separated up into the upper portion of the chest wound.  I  discussed the operative procedure of sternal wound debridement with the  patient and her family including the possibility that I may need to remove  her sternal wires, and she may require further debridement including her  sternum.  We stressed alternatives, benefits, and risks including bleeding,  blood transfusion, persistent infection, injury to the heart, and need for  further surgeries.  She understood and agreed to proceed.   OPERATIVE  PROCEDURE:  The patient was taken to the operating room and placed  on the table supine position.  After induction of general endotracheal  anesthesia, the chest and abdomen were prepped with Betadine soap and  solution and draped in usual sterile manner.  The lower portion of the  sternal wound was already separated.  The sutures were removed.  This  opening actually tracts superiorly up to the upper third of the chest  incision.  Therefore, the subcutaneous tissue was opened up to this  location.  There is a large amount of fibrinous and purulent debris present  over the subcutaneous tissue.  The sternal edges appeared approximated but  were not healed together, and the wires appeared intact.  I could not tell  if the sternum was viable.  I elected to leave the wires intact and not to  debris any of the sternum at this time and to treat this with a wound vac to  see if we could facilitate healing.  The wound was irrigated with saline  solution.  A vac sponge was cut to the appropriate  size and placed in the  wound.  Then, the occlusive dressing was applied over this, and the vac sponge was  connected to the suction system.  The sponge fit nicely.  The sponge,  needles and instrument counts were correct according to the scrub nurse.  The patient was then awakened, extubated, and transported to the post  anesthesia care unit in satisfactory and stable condition.      Evelene Croon, M.D.  Electronically Signed     BB/MEDQ  D:  01/02/2006  T:  01/03/2006  Job:  [id number]

## 2010-09-22 NOTE — Discharge Summary (Signed)
[name], [name]               ACCOUNT NO.:  [id number]   MEDICAL RECORD NO.:  [id number]          PATIENT TYPE:  INP   LOCATION:  2015                         FACILITY:  [facility]   PHYSICIAN:  Vesta Mixer, M.D. DATE OF BIRTH:  [birth year]   DATE OF ADMISSION:  02/06/2006  DATE OF DISCHARGE:  02/10/2006                               DISCHARGE SUMMARY   DISCHARGE DIAGNOSES:  1. Sternal wound infection.  2. History of coronary artery disease - status post coronary bypass      grafting.  3. Congestive heart failure.  4. Elevated left hemidiaphragm.  5. Dyslipidemia.  6. Insulin-dependent diabetes mellitus.  7. COPD.   DISCHARGE MEDICATIONS:  Aspirin 325 mg a day, Lasix 40 mg a day,  Levaquin 500 mg a day, Lopressor 50 mg p.o. b.i.d., lisinopril 20 mg a  day, metformin 500 mg twice a day, Protonix 40 mg a day, Crestor 10 mg a  day, potassium chloride 10 mg day, Darvocet as needed, Plavix 75 mg a  day, Cymbalta once a day.   DISPOSITION:  The patient will see [name] in several weeks.   HISTORY:  [name] is a 65 year old female with a history of coronary  artery disease status post recent coronary artery bypass grafting.  She  was admitted on February 06, 2006 with shortness of breath and some chest  pain.  She had been treated for a sternal wound infection and is status  post a debridement and IV antibiotics.  She was admitted for shortness  of breath.   HOSPITAL COURSE:  1. Sternal wound infection.  The patient was seen by [name].  She      had a CT of her chest which revealed no evidence of deep infection.      She was continued on antibiotics and then we gradually changed to      p.o. antibiotics.  She will follow up with [name] as needed.  2. Dyspnea.  The patient was admitted with wheezing and some volume      overload.  She was treated with IV diuretics as well as      antibiotics.  She gradually improved.  She will be followed up by      [name] for  this.  All of her other medical problems remained      stable.           ______________________________  Vesta Mixer, M.D.     PJN/MEDQ  D:  05/09/2006  T:  05/09/2006  Job:  [id number]   cc:   Olam Idler, M.D.

## 2010-09-22 NOTE — Discharge Summary (Signed)
[name], [name]               ACCOUNT NO.:  [id number]   MEDICAL RECORD NO.:  [id number]          PATIENT TYPE:  INP   LOCATION:  6739                         FACILITY:  [facility]   PHYSICIAN:  [name]. [name], M.D. DATE OF BIRTH:  1945-12-25   DATE OF ADMISSION:  01/01/2006  DATE OF DISCHARGE:  01/18/2006                                 DISCHARGE SUMMARY   ADMISSION DIAGNOSIS:  Sternal wound infection.   DISCHARGE DIAGNOSES:  1. Sternal wound infection, status post debridement, insertion of V.A.C.      drainage system.  2. Coronary artery disease, status post coronary artery bypass graft x4,      December 04, 2005.  3. Hypertension.  4. Hyperlipidemia.  5. Diabetes mellitus type 2.  6. Chronic recurrent bronchitis.  7. Longstanding tobacco abuse.  8. Depression.  9. Anxiety.  10.History of of cerebrovascular accident, questionable.  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  12.Degenerative  arthritis with chronic right hip and lower back pain.  13.Gastroesophageal reflux disease.   CONSULTATIONS:  1. On January 03, 2006 wound care was consulted for evaluation of wound      V.A.C.  2. On January 10, 2006 infectious diseases was consulted, Dr. Johny Sax.  3. On January 16, 2006 nutrition was consulted.   PROCEDURES:  1. On January 02, 2006 the patient underwent sternal wound debridement with      insertion of V.A.C. drainage system by Dr. Evelene Croon.  2. On January 11, 2006 the patient again underwent debridement of sternal      wound, removal of sternal wires and insertion of a V.A.C. drainage      system by Dr. Evelene Croon.   HISTORY AND PHYSICAL:  This is a 65 year old woman who underwent coronary  artery bypass grafting x4 by Dr. Tressie Stalker on [phone].  The patient  is an obese, diabetic.  The patient returned to the office on January 01, 2006 with at least a 2-day history of separation of the lower portion of her  sternal  wound with foul-smelling drainage.  On examination the wound had  separated and there was purulent material present within the wound.  The  sternum was felt grossly stable.  She has no fever or chills.  It was felt  that the patient required debridement in the operating room.  The patient  was admitted to [facility].   HOSPITAL COURSE:  Upon admission the patient had a CT scan of the chest  which did not show any deep sternal wound infection.  Edges of the sternal  bone appeared approximately but not healed.  The subcutaneous tissue  appeared separated up in the upper portion of the chest wound.  It was  thought that the patient would undergo sternal wound debridement.  The risks  and benefits were explained to the patient in great detail and she  understood and agreed to proceed.  The patient underwent debridement of the  sternal wound with a V.A.C. placement without any complications.  The  patient's wound V.A.C. was changed every other  day.  On January 07, 2006  the patient's V.A.C. change showed wires visible in the upper sternal wound.  The rest of the wound is beefy red with granulation visible.  V.A.C. was  placed at 125 cm of suction.  The wound was examined on January 10, 2006  which showed good granulation of the soft tissue, a small amount of exudate.  There is purulent drainage from an area of the sternal wound deep in the  wound which likely indicated osteomyelitis or sternal necrosis.  The patient  was then taken back to the OR for further debridement of the sternal wound  and removal of sternal wires on January 11, 2006.  The patient tolerated  this procedure well, and the wound V.A.C. was placed at the end of the  procedure.  The patient continues wound V.A.C. every other day.  On  January 14, 2006 the patient wound V.A.C. showed healthy granulation  tissue developing with no significant purulent drainage or other signs of  devitalized tissue.  The patient  will continue every other day wound V.A.C.  changes as long as it appears to be effective.   Upon admission the patient was started on IV vancomycin per pharmacy.  The  patient was also started on Cipro until the final cultures were known.  The  patient's final cultures grew Staphylococcus aureus/MRSA, E coli, and  Pseudomonas.  The patient was continued on IV vancomycin and her Cipro was  changed to IV.  Infectious diseases called and felt that the January 09, 2006 due to the patient's allergy to penicillin.  The patient was then  started on aztreonam IV.  The patient has continued these antibiotics  throughout the rest of her stay and will continue them for a total of 6  weeks.  Upon discharge the patient will have day #15 of vancomycin and Cipro  and day #7 of aztreonam.  The patient's Cipro will be changed to p.o. at  discharge and her IV Cipro will be discontinued.  The patient has been  tolerating these antibiotics well.  The patient did have a PICC line placed  on January 16, 2006 so she will be able to go home with IV antibiotics.   The patient has a history of diabetes mellitus.  She has been maintained on  a sliding scale insulin and her CBCs have been well controlled throughout  her stay.  A nutrition assessment was done on January 16, 2006 to help  supplement for a protein depletion/nutrition.  Nutrition discussed with the  patient increasing high proteins next to 3 times a day between meals.  She  discussed an increased healthy diet with an increase in protein and fluids.  The patient goal will be to continue to consume 100% of meals and to add a  multivitamin daily to her medications.   The patient's pain has been well controlled with IV morphine with V.A.C.  changes and p.o. pain medication in the interim.  The patient did complain  of constipation/straining for a bowel movement on January 15, 2006.  A stool softener was added to the patient's medications.  The  patient has no  further issues.  The patient's white count has remained stable.  Her renal  function has been within normal limits.  The patient did have some lower  extremity edema.  She has been treated with Lasix and is responding  appropriately.  The patient's potassium is being replaced with potassium  chloride daily.  The patient's blood  pressure has been well controlled with  Lopressor and lisinopril.   The patient is ambulating well with a steady gait.  She will go home with  home health for IV antibiotics, wound V.A.C. change and home physical  therapy for reconditioning.  She will be discharged home tomorrow provided  her sternal wound remains stable or improves.   PHYSICAL EXAMINATION:  VITAL SIGNS:  Physical exam shows the patient is  afebrile with vital signs stable.  CHEST:  Wound was examined on January 16, 2006 which showed good  granulation tissue and very minimal sloughing.  The patient's PICC line is  without erythema.  HEART:  Regular rhythm.  LUNGS:  Clear to auscultation bilaterally.  EXTREMITIES:  Positive edema bilaterally.  ABDOMEN:  Benign.  Sternal wound V.A.C. in place.   The patient had a white blood cell count 7.8, hemoglobin 11.0, hematocrit  32.8, platelet count 243.  The BMP showed a sodium 145, potassium 4.0,  chloride 102, CO2 28, BUN 11, creatinine 1.0 and glucose 86.  Patient had an  albumin of 2.5 and prealbumin of 16.6.   MEDICATIONS:  1. Tylox 1-2 tabs q.4h. p.r.n.  2. Potassium chloride 20 mEq 1 tab p.o. daily.  3. Folic acid 1 mg p.o. daily.  4. Aspirin 325 mg p.o. daily.  5. Lopressor 50 mg p.o. b.i.d.  6. Lisinopril 10 mg p.o. daily.  7. Crestor 2 mg p.o. daily.  8. Protonix 40 mg p.o. daily.  9. Lasix 40 mg  p.o. daily.  10.Metformin 500 mg p.o. b.i.d.  11.Multivitamin daily.  12.Albuterol inhaler 1 puff q.4h. p.r.n.  13.Colace 100 mg p.o. daily p.r.n.  14.Humalog 75/25 insulin as needed.  15.Vancomycin 1 gram IV every 24 hours  x4 weeks.  16.Aztreonam 1 gram IV q.8h. x5 weeks.  17.Cipro 500 mg p.o. b.i.d. x4 weeks.  18.Xanax 0.25 mg p.o. p.r.n.   INSTRUCTIONS:  1. Patient instructed to follow a low-fat, low-salt diabetic diet with an      increase in protein.  The patient's diet was discussed with nutrition.  2. The patient may take a sponge bath and is to keep her sternal wound      dry.  3. She is to ambulate 3-4 times daily and increase activity as tolerated.  4. The patient is to do no driving or heavy lifting greater than 10 pounds      for 3 weeks.  5. She is to call the office if any wound problems shall arise, if there      is incision erythema or temperature greater than 101.5.  6. The patient will have a home health RN for wound V.A.C. change.   FOLLOW-UP APPOINTMENTS:  1. The patient will have a follow-up appointment with [name] on      September 24,2007 at 12:15 p.m. 2. She will follow up with Dr. Ninetta Lights in his clinic.  Dr. Ninetta Lights will      contact the patient with the time and date of appointment.      Constance Holster, PA      [name]. [name], M.D.  Electronically Signed   JMW/MEDQ  D:  01/17/2006  T:  01/18/2006  Job:  [id number]   cc:   Lacretia Leigh. Ninetta Lights, M.D.

## 2010-09-22 NOTE — Op Note (Signed)
[name], [name]               ACCOUNT NO.:  [id number]   MEDICAL RECORD NO.:  [id number]          PATIENT TYPE:  INP   LOCATION:  2301                         FACILITY:  [facility]   PHYSICIAN:  Quita Skye. Krista Blue, M.D.  DATE OF BIRTH:  Dec 21, 1945   DATE OF PROCEDURE:  12/04/2005  DATE OF DISCHARGE:                                 OPERATIVE REPORT   TRANSESOPHAGEAL ECHOCARDIOGRAM:  Ms. [name] is a 65 year old white female who presents to the  operating room for coronary artery bypass grafting by [name].  [name]  requested intraoperative transesophageal echocardiogram for the  intraoperative management of the patient.  Following a routine cardiac  induction, the transesophageal probe was covered with a latex free sheath,  lubricated and carefully inserted into the patient's esophagus and stomach  for cardiac imaging.  Minimal resistance was experienced during the  insertion and image quality was good following insertion.  Initial overall  images of the heart showed no evidence of pericardial effusion or masses.  The interventricular septum was evaluated which showed no evidence of  interventricular septal defect.  The tricuspid valve was evaluated which  showed trace regurgitation but a normal appearing valve.  The right  ventricle had good contractility.  No evidence of masses or thrombus.  The  right ventricle had no evidence of submental wall motion abnormalities.  The  pulmonary valve was visualized which showed trace pulmonary regurgitation  with the pulmonary artery catheter crossing the valve in good position.  The  left atrium was then evaluated which was normal size with no evidence of  thrombus or masses in the atrium or appendage.  The mitral valve was normal  with no evidence of thickening or calcifications.  There was no mitral  regurgitation noted.  The mitral valve in flow patterns were evaluated which  showed an E wave of 71 cm per second, an A wave of 70 cm per  second with an  E/A ratio of 1.0.  The pulmonary vein flows were investigated which showed a  pulmonary vein with no reversible flow, S wave of 51 and a D wave of 35.  There was no evidence of mitral stenosis and the left ventricle was then  evaluated which had slightly reduced contractility but no evidence of  segmental wall motion abnormalities with an EF of about 35-40%.  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  There was no evidence of stenosis or regurgitation with aortic valve  area measuring approximately 3.5 cm squared.  The ascending aorta appeared  to be normal and the descending aorta was measured at approximately 2 cm  just below the arch with some mild to moderate atherosclerotic disease but  no large mobile plaques or polyp looking lesions.  The patient then  underwent bypass grafting and following separation from the coronary artery  bypass pump, the patient had good   DICTATION ENDED AT THIS POINT           ______________________________  Quita Skye. Krista Blue, M.D.     JDS/MEDQ  D:  12/04/2005  T:  12/05/2005  Job:  [id number]

## 2010-09-22 NOTE — Op Note (Signed)
NAMEBRINLY, Heidi               ACCOUNT NO.:  [id number]   MEDICAL RECORD NO.:  [id number]          PATIENT TYPE:  INP   LOCATION:  2301                         FACILITY:  [facility]   PHYSICIAN:  [name], M.D. DATE OF BIRTH:  Jan 31, 1946   DATE OF PROCEDURE:  12/04/2005  DATE OF DISCHARGE:                                 OPERATIVE REPORT   PREOPERATIVE DIAGNOSIS:  Severe three-vessel coronary artery disease.   POSTOPERATIVE DIAGNOSIS:  Severe three-vessel coronary artery disease.   PROCEDURE:  Median sternotomy for coronary artery bypass grafting x4  (saphenous vein graft to distal left anterior descending coronary artery,  left internal mammary artery to ramus intermediate branch, saphenous vein  graft to second circumflex marginal branch, saphenous vein graft to distal  right coronary artery, endoscopic saphenous vein harvest from bilateral  thighs).   SURGEON:  Dr. Purcell Nails.   ASSISTANT:  Ms. Jerold Coombe.   ANESTHESIA:  General.   BRIEF CLINICAL NOTE:  The patient is a 65 year old obese white female with  history of coronary artery disease, hypertension, hyperlipidemia, type 2  diabetes mellitus, longstanding tobacco abuse, chronic relapsing bronchitis,  degenerative arthritis, depression, anxiety and a variety of other problems.  The patient presents with symptoms consistent with angina pectoris.  She  underwent cardiac catheterization by Dr. Delane Ginger demonstrating severe  three-vessel coronary artery disease with mild left ventricular dysfunction  and coronary anatomy unfavorable for percutaneous coronary intervention.  A  full consultation note has been dictated previously.  The patient has been  counseled at length regarding the indications, risks, and potential benefits  of surgery.  The patient understands the somewhat high risks associated with  surgery given her multiple medical problems.  All of her questions have been  addressed.   OPERATIVE FINDINGS:  1.  Normal left ventricular function.  2.  Bicuspid aortic valve with no aortic stenosis, no aortic insufficiency.  3.  Diffuse coronary artery disease with poor targets for grafting.  4.  Fair-quality saphenous vein conduit for grafting.  5.  The patient would not be felt to be considered candidate for redo      surgical revascularization in the future due to inadequate targets for      grafting.   OPERATIVE NOTE IN DETAIL:  The patient was brought to the operating room on  the above-mentioned date, and central monitoring was established by the  anesthesia service under the care and direction of Dr. Adonis Huguenin.  Specifically, a Swan-Ganz catheter was placed through the right internal  jugular approach.  A radial arterial line was placed.  Intravenous  antibiotics were administered.  Following induction with general  endotracheal anesthesia, a Foley catheter was placed.  The patient's chest,  abdomen, both groins, and both lower extremities were prepared and draped in  a sterile manner.   Baseline transesophageal echocardiogram was performed by Dr. Krista Blue.  This  demonstrates normal left ventricular function.  The patient has a bicuspid  aortic valve, but there is no aortic insufficiency and no aortic stenosis.  No other abnormalities are noted.   A median  sternotomy incision was performed, and the left internal mammary  artery was dissected from the chest wall and prepared for bypass grafting.  Left internal mammary artery was somewhat small caliber but has excellent  flow.  Simultaneously saphenous vein was obtained from the patient's left  thigh using endoscopic vein harvest technique.  Saphenous vein was slightly  small caliber and only fair quality conduit.  An additional segment of  saphenous vein was obtained the patient's right thigh using endoscopic vein  harvest technique.  This vein was similar in quality.  After the saphenous  vein was removed, small  surgical incisions in both lower extremities were  both closed in multiple layers with running absorbable suture.   The patient was heparinized systemically.  The pericardium was opened.  The  ascending aorta had calcified plaque along the posterior wall at the level  of the transverse aortic arch.  Anteriorly, the aorta was fairly normal.  The ascending aorta was cannulated uneventfully.  The venous cannula was  placed through the tip of the right atrial appendage.  Cardiopulmonary  bypass was begun, and the surface of the heart was inspected.  Distal sites  were selected for coronary bypass grafting.  The patient has diffuse  coronary artery disease with hard palpable plaque throughout all of the  vessels.  A temperature probe was placed in the left ventricular septum.  A  cardioplegic catheter was placed in the ascending aorta.   The patient was allowed to cool passively to 32 degrees systemic  temperature.  The aortic crossclamp was applied, and cold blood cardioplegia  was administered in antegrade fashion through the aortic root.  Iced saline  slush was applied for topical hypothermia.  The initial cardioplegic arrest  and myocardial cooling were felt to be excellent.  Repeat doses of  cardioplegia administered intermittently throughout the crossclamp portion  of the operation through the aortic root and down the subsequently placed  vein graft to maintain left ventricular septal temperature below 15 degrees  centigrade.   The following distal coronary anastomoses were performed:  1.  The distal right coronary artery was grafted with a saphenous vein graft      in an end-to-side fashion.  This vessel was diffusely diseased and a      relatively poor target for grafting.  It measured 1.5 mm in diameter at      the site of distal bypass.  2.  The second circumflex marginal branch was grafted with a saphenous vein      graft in an end-to-side fashion.  This vessel was quite small,  but it     represented the largest terminal branch of the left circumflex system.      It measured 1.1 mm in diameter of the site of distal bypass and was a      fair quality target.  3.  The ramus intermediate branch was grafted with the left internal mammary      artery in an end-to-side fashion.  This vessel was the largest and best      quality target of all of the patient's distal target vessels.  It      measured 1.5 mm at the site of distal bypass and was a fair quality      target.  Diffusely diseased proximally.  4.  The distal left anterior descending coronary artery was grafted with a      saphenous vein graft in an end-to-side fashion.  This vessel was  diffusely diseased and a very poor quality target.  It measured 1.0 mm      in diameter at the site of distal bypass.   All three proximal saphenous vein anastomoses were performed directly to the  ascending aorta prior to removal of the aortic crossclamp.  The left  ventricular temperature rose with reperfusion of the left internal mammary  artery.  The aortic crossclamp was removed after a total crossclamp time of  88 minutes.  The heart began to beat spontaneously without need for  cardioversion.  All proximal and distal coronary anastomoses were inspected  for hemostasis and appropriate graft orientation.  Epicardial pacing wires  were fixed to the right ventricular outflow tract into the right atrial  appendage.  The patient was weaned from cardiopulmonary bypass without  difficulty.  The patient's rhythm at separation from bypass was normal sinus  rhythm.  No inotropic support was required.  Total cardiopulmonary bypass at  time of the operation 104 minutes.   Follow-up transesophageal echocardiogram performed by Dr. Krista Blue after  separation from bypass demonstrated preserved left ventricular function with  no other abnormalities.  The venous and arterial cannulae were removed  uneventfully.  Protamine was  administered to reverse the anticoagulation.  The mediastinum and left chest were drained using three chest tubes exited  through separate stab incision superiorly.  The soft tissues anterior to the  aorta were reapproximated loosely.  The sternum was closed using double-  strength sternal wire.  The soft tissues anterior to the sternum were closed  in multiple layers, and the skin was closed with a running subcuticular skin  closure.   The patient tolerated the procedure well and was transported to the surgical  intensive care unit in stable condition.  There were no intraoperative  complications.  All sponge, instrument and needle counts were verified  correct at completion of the operation.  No blood products were  administered.      [name], M.D.  Electronically Signed     CHO/MEDQ  D:  12/04/2005  T:  12/04/2005  Job:  [id number]   cc:   Vesta Mixer, M.D.  Cheri Rous

## 2010-09-22 NOTE — Cardiovascular Report (Signed)
[name], [name]               ACCOUNT NO.:  [id number]   MEDICAL RECORD NO.:  [id number]          PATIENT TYPE:  OIB   LOCATION:  1965                         FACILITY:  [facility]   PHYSICIAN:  Vesta Mixer, M.D. DATE OF BIRTH:  21-May-1945   DATE OF PROCEDURE:  07/04/2005  DATE OF DISCHARGE:                              CARDIAC CATHETERIZATION   INDICATIONS:  Jaunice Mirza is a 65 year old female with a history of  hypertension, hypercholesterolemia, diabetes mellitus and coronary artery  disease. She is status post PTCA of her LAD many years ago. She now presents  with classic symptoms of angina. We scheduled her for a heart  catheterization based on these symptoms.   PROCEDURE:  Left heart catheterization with coronary angiography.   The right femoral artery was easily cannulated using a modified Seldinger  technique.   HEMODYNAMICS:  LV pressure is 146/60. Aortic pressure of 145/81.   ANGIOGRAPHY:   LEFT MAIN:  Left main is normal.   Left anterior descending artery is normal in the proximal segment. The mid  is diffusely diseased to 50%. There is a 99% stenosis in the mid/distal LAD.  The distal LAD is small but enlarges after sublingual nitroglycerin. There  are only very tiny diagonal arteries that are not revascularization  candidates.   Left circumflex artery has a proximal 70% to 80%. There is a large high  obtuse marginal artery/ramus intermediate artery. There is a 70% to 80%  stenosis in that vessel.   The second obtuse marginal artery has an 80% stenosis and the posterolateral  branch has a 60% stenosis in each of the small branches.   Right coronary artery is large and dominant. There is a proximal 70%  stenosis. This stenosis is quite long. The mid and distal vessel are fairly  normal, but there is an 80% stenosis in the posterolateral branch.   Left ventriculogram was performed in the 30 RAO position. It reveals mild  anterior wall hypokinesis.  Ejection fraction is 45% to 50%.   COMPLICATIONS:  None.   CONCLUSION:  1.  Severe three-vessel coronary artery disease.  2.  Mildly depressed left ventricular systolic function. We will consult      CVTS for consideration for bypass surgery.           ______________________________  Vesta Mixer, M.D.     PJN/MEDQ  D:  07/04/2005  T:  07/04/2005  Job:  [id number]   cc:   Cheri Rous  Fax: [phone]

## 2010-09-22 NOTE — Consult Note (Signed)
[name], [name]               ACCOUNT NO.:  [id number]   MEDICAL RECORD NO.:  [id number]          PATIENT TYPE:  EMS   LOCATION:  ED                           FACILITY:  [facility]   PHYSICIAN:  Gustavus Messing. Orlin Hilding, M.D.DATE OF BIRTH:  1945/08/25   DATE OF CONSULTATION:  07/17/2004  DATE OF DISCHARGE:                                   CONSULTATION   PRIMARY CARE PHYSICIAN:  Jethro Bastos, M.D.   CHIEF COMPLAINT:  Left facial weakness, right body numbness, dysarthria, and  hoarseness.   HISTORY OF PRESENT ILLNESS:  [name] is a 65 year old, right-handed  white female with a recent complicated history.  She says four weeks ago on  Valentine's Day, February 14th, she went to [facility] emergency room with  sudden onset of right arm and face numbness, left facial weakness and  dysarthria.  Apparently, while she was there, the left facial weakness and  dysarthria resolved somewhat, although she says that the right arm and face  numbness have persisted since that time.  Although the intake information  lists weakness, droop, nausea and vomiting, she was given a discharge  diagnosis of vomiting and nausea and given antiemetics.  She says she did  not have a CT scan of the brain there.  Two days later, she saw her primary  physician, Dr. Dorothe Pea, who at that time ordered a brain MRI, which was done  as an outpatient the next day.  Immediately after the MRI, the patient had  return of the left facial weakness and the dysarthria and presented to  [facility] emergency room, where she was then admitted and seen by  Dr. Leim Fabry, neurologist there, and was kept there for four days;  however, it appears that her MRI was misread or mistaken for someone else's,  and she was given a diagnosis of MS initially and was given a dose of IV  Solu-Medrol.  It appears at that point then, she was felt not to have MS.  Had a workup which included at least a partial stroke workup with  ultrasound  of the neck, as far as I can tell of the patient and her family.  The  discharge diagnosis was it was uncertain whether she had MS versus stroke.  She was then set up with an outpatient appointment to get a second opinion  with Dr. Lily Lovings at [facility], who felt that her symptoms were, in  fact, consistent with stroke.  She had either been placed on Plavix at  [facility] or had been on Plavix already for her coronary artery disease, but  at any rate, Dr. Lily Lovings had added aspirin and arranged for an MR  angiogram to be done and an MRI with and without contrast; however, before  that could be done, the patient today had return of her symptoms of  dysarthria and left facial weakness, which has lasted about an hour to two  hours.  Her daughter brought her into [facility] emergency room.  She has  had some improvement of the dysarthria but still has the facial weakness.  REVIEW OF SYSTEMS:  Negative for chest pain or shortness of breath.  Her  voice is hoarse.  She feels drawing on the left side of her face.  No  headache.  Chronic right leg symptoms due to a presumed radiculopathy.   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  History of right L3 or L4 radiculopathy.  This recent bout of right sensory  and left facial motor symptoms.   MEDICATIONS:  Glipizide, hydrochlorothiazide, aspirin, Plavix, lisinopril,  Protonix, and Norvasc.  She got one dose of Solu-Medrol recently and also  was given a dose or two of insulin after her glucose elevated after  steroids.   ALLERGIES:  1.  SULFA.  2.  PENICILLIN.  3.  IVP DYE.  4.  CODEINE.   SOCIAL HISTORY:  Positive for being a smoker.   FAMILY HISTORY:  Positive for stroke.   PHYSICAL EXAMINATION:  VITAL SIGNS:  Temperature 97.1, pulse 93,  respirations 20, BP 115/74.  HEENT:  Head is normocephalic and  atraumatic.  NECK:  Without bruits.  LUNGS/RESPIRATORY:  HEART:  Regular rate and rhythm without murmurs.  NEUROLOGIC:  Mental status:  She is awake, alert and oriented with normal  naming repetition and comprehension.  Cranial nerves:  Her pupils are equal  and reactive.  Disks are sharp.  Visual fields are full to confrontation.  Extraocular movements are intact.  Facial sensation is normal on the left.  Diminished to pinprick and temperature on the right.  She feels light touch.  Facial motor activity is normal.  In the upper half of the face, she has a  central-appearing left facial droop with nasolabial fold flattening on the  left.  Hearing is intact.  Palate is symmetric.  Tongue is midline.  On  motor exam, there is no drift on either upper or lower extremity.  She has  normal bulk, tone, and strength throughout with the exception of slight  psoas weakness on the right, which is chronic due to her radiculopathy.  There is no drift for satellite, and she has normal rapid fine movement.  She has no fasciculations, atrophy, or tremor.  Reflexes are 1-2+ and  symmetric in the upper extremities, ankles, and left knee.  It is absent at  the right knee, again related to her radiculopathy.  Coordination:  Finger-  to-nose, rapid alternating movements, and heel-to-shin are normal.  Sensory  exam is decreased to temperature and pinprick, preserved to light touch on  the right.   CT scan of the brain shows nothing acute.  Cannot really see anything in her  brain stem.   Her glucose is 261.  Sodium 134, potassium 4, chloride 98, CO2 26, BUN 17,  creatinine 0.9.  Calcium 9.6, protein 7.2, albumin 3.7, SGOT 28, SGPT 35,  alk phos 88, bilirubin 0.6.  White blood cell count is 12.5, hemoglobin  14.5, hematocrit 41, platelets 268.  PT is 11.9, INR 0.8, PTT 24.   IMPRESSION:  Right sensory loss to pinprick and temperature x4 weeks with fluctuating left facial droop and dysarthria, which is  concerning for a  brainstem event, possibly a pons or __________ stroke with potential  extension.  She also has hypertension and diabetes.   PLAN:  Patient is not a candidate for t-PA, given a greater than three-hour  duration of symptoms with fluctuation and improvement.  She is not a  candidate for the _____________ study,  given no motor deficit by her NIHS  stroke scale and recent questionable stroke four weeks ago.  She needs  further inpatient evaluation with MRI of the brain, MR angiogram, and repeat  ultrasounds; however, she declines admission here and requests transfer to  [facility] to see her neurologist, Dr. Lily Lovings.  I spoke with Dr.  Selena Batten, who is the neurology/stroke attending on call, who agreed to accept her  in transfer.      CAW/MEDQ  D:  07/17/2004  T:  07/17/2004  Job:  [id number]

## 2010-09-22 NOTE — Op Note (Signed)
[name], [name]               ACCOUNT NO.:  [id number]   MEDICAL RECORD NO.:  [id number]          PATIENT TYPE:  INP   LOCATION:  6739                         FACILITY:  [facility]   PHYSICIAN:  Evelene Croon, M.D.     DATE OF BIRTH:  Aug 03, 1945   DATE OF PROCEDURE:  01/11/2006  DATE OF DISCHARGE:                                 OPERATIVE REPORT   PREOPERATIVE DIAGNOSIS:  Superficial sternal wound infection.   POSTOPERATIVE DIAGNOSIS:  Superficial sternal wound infection.   OPERATIVE PROCEDURE:  Debridement of sternal wound, removal of sternal  wires, and insertion of BAC drainage system.   SURGEON:  Evelene Croon, M.D.   ANESTHESIA:  General endotracheal.   CLINICAL HISTORY:  This patient is a 65 year old woman who underwent  coronary bypass by Dr. Freida Busman on December 04, 2005.  She is an obese diabetic and  returned with drainage from the lower portion of her sternal wound.  I took  her to the operating room on January 02, 2006, to debride her wound.  The  sternum appeared to be approximated at that time, but I could not tell  whether the bone was viable.  The sternal wires were intact but slightly  loose.  We treated her with debridement and insertion of the BAC drainage  system, having monitored the wound every few days with dressing changes.  Over the past week, the wound has continued to improve overall.  There is  good granulation tissue over most of the wound, but there is some fibrinous  exudate present at the upper portion of the wound and some drainage of  purulent material around the sternal wires.  She has grown three different  bacteria and been treated with a three intravenous antibiotics by infectious  disease.  I felt it would be best to return to the operating room for  further debridement of the wound and removal of these sternal wires since  they were likely an ongoing source of infection.  I discussed the operative  procedure of sternal wound debridement and removal  of sternal wires with the  patient and her family.  I discussed the possibility that the wound may  require further debridement and possibly complete debridement of her sternum  itself.  She understood and agreed to proceed.   OPERATIVE PROCEDURE:  The patient was taken to the operating room, placed on  the table in the supine position.  After induction of general endotracheal  anesthesia, the chest was prepped with Betadine soap and solution and draped  in the usual sterile manner.  The examination of the wound showed that there  was some fibrinous exudate present on the superior 1/3 of the wound but this  was very superficial and was debrided down to viable appearing tissue.  Most  of the wound was covered with good granulation tissue.  The sternum itself  was approximated but had not healed together yet.  There were 3-4 sternal  wires exposed within the wound which were somewhat loose, and these were  removed.  Examination of the bone itself showed there was good bleeding from  the edges of the bone when I used a rongeur to nibble at the edges.  There  did not appear to be any drainage from below the sternum.  At this point, I  felt it would best to leave the sternum intact and replace the BAC sponge  and continue treating her with BAC dressing changes and intravenous  antibiotics.  I think it would become evident if she continues to drain  permanent fluid from the area of the sternum that the sternum is nonviable  and needs extensive  debridement.  The sponge, needle and instrument counts were correct  according to the scrub nurse.  The BAC sponge was replaced and the patient  was awakened, extubated and transferred to the post anesthesia care unit in  satisfactory stable condition.      Evelene Croon, M.D.  Electronically Signed     BB/MEDQ  D:  01/11/2006  T:  01/11/2006  Job:  [id number]

## 2011-05-08 DIAGNOSIS — I639 Cerebral infarction, unspecified: Secondary | ICD-10-CM

## 2011-05-08 HISTORY — DX: Cerebral infarction, unspecified: I63.9

## 2011-05-17 ENCOUNTER — Encounter (HOSPITAL_BASED_OUTPATIENT_CLINIC_OR_DEPARTMENT_OTHER): Payer: Self-pay | Admitting: *Deleted

## 2011-05-17 ENCOUNTER — Emergency Department (INDEPENDENT_AMBULATORY_CARE_PROVIDER_SITE_OTHER): Payer: Medicare Other

## 2011-05-17 ENCOUNTER — Emergency Department (HOSPITAL_BASED_OUTPATIENT_CLINIC_OR_DEPARTMENT_OTHER)
Admission: EM | Admit: 2011-05-17 | Discharge: 2011-05-17 | Discharge status: Against Medical Advice | Payer: Medicare Other | Source: Home / Self Care | Attending: Emergency Medicine | Admitting: Emergency Medicine

## 2011-05-17 ENCOUNTER — Other Ambulatory Visit: Payer: Self-pay

## 2011-05-17 DIAGNOSIS — I252 Old myocardial infarction: Secondary | ICD-10-CM | POA: Insufficient documentation

## 2011-05-17 DIAGNOSIS — Z79899 Other long term (current) drug therapy: Secondary | ICD-10-CM | POA: Insufficient documentation

## 2011-05-17 DIAGNOSIS — I6350 Cerebral infarction due to unspecified occlusion or stenosis of unspecified cerebral artery: Secondary | ICD-10-CM | POA: Insufficient documentation

## 2011-05-17 DIAGNOSIS — I10 Essential (primary) hypertension: Secondary | ICD-10-CM | POA: Insufficient documentation

## 2011-05-17 DIAGNOSIS — R51 Headache: Secondary | ICD-10-CM

## 2011-05-17 DIAGNOSIS — R209 Unspecified disturbances of skin sensation: Secondary | ICD-10-CM

## 2011-05-17 DIAGNOSIS — Z8679 Personal history of other diseases of the circulatory system: Secondary | ICD-10-CM | POA: Insufficient documentation

## 2011-05-17 DIAGNOSIS — E119 Type 2 diabetes mellitus without complications: Secondary | ICD-10-CM | POA: Insufficient documentation

## 2011-05-17 DIAGNOSIS — I639 Cerebral infarction, unspecified: Secondary | ICD-10-CM

## 2011-05-17 DIAGNOSIS — I509 Heart failure, unspecified: Secondary | ICD-10-CM

## 2011-05-17 DIAGNOSIS — R29898 Other symptoms and signs involving the musculoskeletal system: Secondary | ICD-10-CM

## 2011-05-17 DIAGNOSIS — R0602 Shortness of breath: Secondary | ICD-10-CM | POA: Insufficient documentation

## 2011-05-17 DIAGNOSIS — I517 Cardiomegaly: Secondary | ICD-10-CM

## 2011-05-17 DIAGNOSIS — R42 Dizziness and giddiness: Secondary | ICD-10-CM

## 2011-05-17 DIAGNOSIS — I2510 Atherosclerotic heart disease of native coronary artery without angina pectoris: Secondary | ICD-10-CM | POA: Insufficient documentation

## 2011-05-17 DIAGNOSIS — R918 Other nonspecific abnormal finding of lung field: Secondary | ICD-10-CM

## 2011-05-17 HISTORY — DX: Essential (primary) hypertension: I10

## 2011-05-17 HISTORY — DX: Acute myocardial infarction, unspecified: I21.9

## 2011-05-17 HISTORY — DX: Reserved for inherently not codable concepts without codable children: IMO0001

## 2011-05-17 HISTORY — DX: Cerebral infarction, unspecified: I63.9

## 2011-05-17 HISTORY — DX: Atherosclerotic heart disease of native coronary artery without angina pectoris: I25.10

## 2011-05-17 HISTORY — DX: Gastro-esophageal reflux disease without esophagitis: K21.9

## 2011-05-17 HISTORY — DX: Bronchitis, not specified as acute or chronic: J40

## 2011-05-17 LAB — DIFFERENTIAL
Basophils Absolute: 0 10*3/uL (ref 0.0–0.1)
Eosinophils Absolute: 0.2 10*3/uL (ref 0.0–0.7)
Eosinophils Relative: 3 % (ref 0–5)

## 2011-05-17 LAB — PRO B NATRIURETIC PEPTIDE: Pro B Natriuretic peptide (BNP): 5817 pg/mL — ABNORMAL HIGH (ref 0–125)

## 2011-05-17 LAB — COMPREHENSIVE METABOLIC PANEL
ALT: 29 U/L (ref 0–35)
AST: 21 U/L (ref 0–37)
Calcium: 9.5 mg/dL (ref 8.4–10.5)
Creatinine, Ser: 0.9 mg/dL (ref 0.50–1.10)
GFR calc Af Amer: 76 mL/min — ABNORMAL LOW (ref >90)
Glucose, Bld: 171 mg/dL — ABNORMAL HIGH (ref 70–99)
Sodium: 141 mEq/L (ref 135–145)
Total Protein: 7.5 g/dL (ref 6.0–8.3)

## 2011-05-17 LAB — URINALYSIS, ROUTINE W REFLEX MICROSCOPIC
Bilirubin Urine: NEGATIVE
Nitrite: NEGATIVE
Specific Gravity, Urine: 1.009 (ref 1.005–1.030)
pH: 5 (ref 5.0–8.0)

## 2011-05-17 LAB — CBC
MCH: 28.7 pg (ref 26.0–34.0)
MCV: 86.3 fL (ref 78.0–100.0)
Platelets: 194 10*3/uL (ref 150–400)
RDW: 15.2 % (ref 11.5–15.5)

## 2011-05-17 LAB — CARDIAC PANEL(CRET KIN+CKTOT+MB+TROPI)
CK, MB: 11.3 ng/mL (ref 0.3–4.0)
Total CK: 356 U/L — ABNORMAL HIGH (ref 7–177)

## 2011-05-17 LAB — URINE MICROSCOPIC-ADD ON

## 2011-05-17 LAB — PROTIME-INR: Prothrombin Time: 14.1 seconds (ref 11.6–15.2)

## 2011-05-17 MED ORDER — ALBUTEROL SULFATE (5 MG/ML) 0.5% IN NEBU: 2.5000 mg | INHALATION_SOLUTION | RESPIRATORY_TRACT | Status: DC | PRN

## 2011-05-17 MED ORDER — ONDANSETRON HCL 4 MG/2ML IJ SOLN: 4.0000 mg | Freq: Three times a day (TID) | INTRAMUSCULAR | Status: DC | PRN

## 2011-05-17 MED ORDER — SODIUM CHLORIDE 0.9 % IV SOLN: INTRAVENOUS | Status: DC

## 2011-05-17 NOTE — ED Provider Notes (Signed)
History     CSN: [id number]  Arrival date & time 05/17/11  1207   First MD Initiated Contact with Patient 05/17/11 1212      Chief Complaint  Patient presents with  . Cerebrovascular Accident    r/o stroke last known normal 2300 05/16/11    (Consider location/radiation/quality/duration/timing/severity/associated sxs/prior treatment) HPI Comments: Also patient has had worsening shortness of breath over the last 3 weeks that is unchanged by her breathing treatments. The shortness of breath is worse with exertion and after exertion when she sits down she develops chest pain that will last for up to 10 minutes. It will be tight and squeezing in her chest with diaphoresis, nausea, shortness of breath and relieved on its own. It does not happen every time with exertion but it is always after exertion. She has been taking her Lasix pill do to increased swelling in her legs. She denies cough, fever, wheezing.  Patient is a 66 y.o. female presenting with Acute Neurological Problem. The history is provided by the patient.  Cerebrovascular Accident This is a new problem. Episode onset: Unknown time of onset. Last normal 11 PM yesterday. The problem occurs constantly. The problem has not changed since onset.Associated symptoms include chest pain and shortness of breath. Pertinent negatives include no abdominal pain and no headaches. The symptoms are aggravated by nothing. The symptoms are relieved by nothing. She has tried nothing for the symptoms. The treatment provided no relief.    Past Medical History  Diagnosis Date  . Stroke   . Coronary artery disease   . Ear infection     right  . Diabetes mellitus   . Hypertension   . 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 Asthma   . Bronchitis   . Reflux     Past Surgical History  Procedure Date  . Cardiac surgery   . Joint replacement   . Arthroplasty   . Debridement skin / sq / muscle of trunk     s/p staph infection from CABG 2007  .  Cystectomy     No family history on file.  History  Substance Use Topics  . Smoking status: Current Everyday Smoker -- 1.0 packs/day for 50 years    Types: Cigarettes  . Smokeless tobacco: Not on file  . Alcohol Use: No    OB History    Grav Para Term Preterm Abortions TAB SAB Ect Mult Living                  Review of Systems  Constitutional: Positive for fatigue. Negative for fever.  Respiratory: Positive for shortness of breath. Negative for cough and wheezing.   Cardiovascular: Positive for chest pain and leg swelling.  Gastrointestinal: Negative for vomiting, abdominal pain and diarrhea.  Neurological: Positive for weakness and numbness. Negative for dizziness, speech difficulty and headaches.  All other systems reviewed and are negative.    Allergies  Codeine; Penicillins; and Sulfa antibiotics  Home Medications   Current Outpatient Rx  Name Route Sig Dispense Refill  . ASPIRIN 81 MG PO TABS Oral Take 160 mg by mouth daily.    Marland Kitchen CLOPIDOGREL BISULFATE 75 MG PO TABS Oral Take 75 mg by mouth daily.    . DULOXETINE HCL 20 MG PO CPEP Oral Take 20 mg by mouth daily.    Marland Kitchen FOLIC ACID 1 MG PO TABS Oral Take 1 mg by mouth daily.    . FUROSEMIDE 20 MG PO TABS Oral Take 20  mg by mouth 2 (two) times daily.    Marland Kitchen HYDROCODONE-ACETAMINOPHEN 5-325 MG PO TABS Oral Take 1 tablet by mouth every 6 (six) hours as needed.    . INSULIN GLARGINE 100 UNIT/ML [place] SOLN Subcutaneous Inject 22 Units into the skin at bedtime.    . INSULIN LISPRO (HUMAN) 100 UNIT/ML [place] SOLN Subcutaneous Inject into the skin 3 (three) times daily before meals.    Marland Kitchen LISINOPRIL 20 MG PO TABS Oral Take 20 mg by mouth daily.    Marland Kitchen METFORMIN HCL 1000 MG PO TABS Oral Take 1,000 mg by mouth 2 (two) times daily with a meal.    . PANTOPRAZOLE SODIUM 20 MG PO TBEC Oral Take 20 mg by mouth daily.    Marland Kitchen POTASSIUM CHLORIDE CRYS ER 20 MEQ PO TBCR Oral Take 40 mEq by mouth 2 (two) times daily.      BP 133/89  Pulse 90   Temp(Src) 98.5 F (36.9 C) (Oral)  Resp 20  SpO2 94%  Physical Exam  Nursing note and vitals reviewed. Constitutional: She is oriented to person, place, and time. She appears well-developed and well-nourished. No distress.  HENT:  Head: Normocephalic and atraumatic.  Mouth/Throat: Oropharynx is clear and moist.  Eyes: Conjunctivae and EOM are normal. Pupils are equal, round, and reactive to light.  Neck: Normal range of motion. Neck supple.  Cardiovascular: Normal rate, regular rhythm and intact distal pulses.   Murmur heard. Pulmonary/Chest: Effort normal. No respiratory distress. She has decreased breath sounds. She has no wheezes. She has no rales. She exhibits no tenderness.  Abdominal: Soft. She exhibits no distension. There is no tenderness. There is no rebound and no guarding.  Musculoskeletal: Normal range of motion. She exhibits no edema and no tenderness.  Neurological: She is alert and oriented to person, place, and time. A cranial nerve deficit and sensory deficit is present. GCS eye subscore is 4. GCS verbal subscore is 5. GCS motor subscore is 6.       3-4/5 strength in the right deltoid, biceps, triceps, hand. Right-sided facial droop. 4/5 strength in the right lower extremity. No slurred speech  Skin: Skin is warm and dry. No rash noted. No erythema.  Psychiatric: She has a normal mood and affect. Her behavior is normal.    ED Course  Procedures (including critical care time)  Labs Reviewed  COMPREHENSIVE METABOLIC PANEL - Abnormal; Notable for the following:    Potassium 3.4 (*)    Glucose, Bld 171 (*)    Albumin 3.2 (*)    GFR calc non Af Amer 66 (*)    GFR calc Af Amer 76 (*)    All other components within normal limits  URINALYSIS, ROUTINE W REFLEX MICROSCOPIC - Abnormal; Notable for the following:    Hgb urine dipstick TRACE (*)    Protein, ur 100 (*)    All other components within normal limits  PRO B NATRIURETIC PEPTIDE - Abnormal; Notable for the  following:    Pro B Natriuretic peptide (BNP) 5817.0 (*)    All other components within normal limits  CARDIAC PANEL(CRET KIN+CKTOT+MB+TROPI) - Abnormal; Notable for the following:    Total CK 356 (*)    CK, MB 11.3 (*)    Relative Index 3.2 (*)    All other components within normal limits  URINE MICROSCOPIC-ADD ON - Abnormal; Notable for the following:    Squamous Epithelial / LPF FEW (*)    Bacteria, UA FEW (*)    All other components  within normal limits  CBC  DIFFERENTIAL  PROTIME-INR  APTT   Dg Chest 2 View  05/17/2011  *RADIOLOGY REPORT*  Clinical Data: Unable to use right hand/arm, numbness, swelling, history stroke, coronary disease, diabetes, hypertension, MI, asthma, bronchitis  CHEST - 2 VIEW  Comparison: 02/01/2010  Findings: Enlargement of cardiac silhouette post CABG. Atherosclerotic calcification aorta. Mediastinal contours and pulmonary vascularity normal. Emphysematous changes with right basilar infiltrate or atelectasis. No definite pleural effusion or pneumothorax. Broad-based thoracolumbar scoliosis. Osseous demineralization.  IMPRESSION: Enlargement of cardiac silhouette post CABG. Atelectasis versus infiltrate right lung base.  Original Report Authenticated By: Lollie Marrow, M.D.   Ct Head Wo Contrast  05/17/2011  *RADIOLOGY REPORT*  Clinical Data: Headache.  Dizziness.  Unable to move right hand. Previous stroke.  CT HEAD WITHOUT CONTRAST  Technique:  Contiguous axial images were obtained from the base of the skull through the vertex without contrast.  Comparison: 03/13/ 2006  Findings: There is no evidence of intracranial hemorrhage, brain edema or other signs of acute infarction.  There is no evidence of intracranial mass lesion or mass effect.  No abnormal extra-axial fluid collections are identified.  Ventricles normal in size.  Mild chronic small vessel disease is noted.  No other intracranial abnormality identified.  No skull fracture or other bone abnormality.   IMPRESSION:  1.  No acute intracranial abnormality. 2.  Mild chronic small vessel disease.  Original Report Authenticated By: Danae Orleans, M.D.     Date: 05/17/2011  Rate: 91  Rhythm: normal sinus rhythm  QRS Axis: normal  Intervals: normal  ST/T Wave abnormalities: nonspecific ST/T changes  Conduction Disutrbances:none  Narrative Interpretation:   Old EKG Reviewed: unchanged    No diagnosis found.    MDM   Patient presenting with new strokelike symptoms. She is out of the window for TPA to 2 unknown length of symptoms. At 11 PM she was normal before she went to bed and when she woke up this morning at 8 AM she had weakness in her right arm and facial droop. She has 3-4/5 strength in the right upper extremity and right facial droop. Also tingling and numbness on the right side with mild weakness in the right leg. Patient has multiple medical problems including diabetes, hypertension, hyperlipidemia status post CABG. She is supposed to be taking Plavix but has been out and not taking it for at least over 2-3 months. Patient last had a stroke over 5 years ago. All of the symptoms resolved not stroke. Concern for stroke today will get a head CT, CBC, CMP, coags.  Secondly patient is a heart patient has not been taking her medications appropriately. She states she's had worsening shortness of breath over the last 3 weeks with severe swelling in her lower strategies. She started taking Lasix due to lower legs swelling so much. Also she has been getting chest pain and pressure immediately after exertion for 10 minutes which causes her to be diaphoretic, nauseous, and more short of breath.  The pain resolves on its own and she does not take nitroglycerin. She states that she is short of breath today but no chest pain. EKG is unchanged from prior. Concern for CHF and stable angina especially given risk factors and history of medical noncompliance. Cardiac enzymes, BNP, chest x-ray pending.  2:50  PM BNP 5800, with CK-MB of 11 but a negative troponin. Unchanged EKG and chest x-ray with a small right pleural effusion. No symptoms suggestive of infectious process with normal  white blood cell count. Will give IV Lasix. Head CT with no sign of acute stroke or bleed. Discussed findings with the patient and will have her admitted over at home for further evaluation.      Gwyneth Sprout, MD 05/17/11 1452

## 2011-05-17 NOTE — ED Provider Notes (Signed)
Called to the patient's room after informing nursing staff that she wishes to leave against medical device. I explained the risks including death, severe disability. Patient expressed her understanding and was able to repeat with complete understanding. She is alert and oriented x3 and is able to make sound decisions. [name] was notified of the patient's leaving AGAINST MEDICAL ADVICE. She will sign a form.  Dayton Bailiff, MD 05/17/11 1544

## 2011-05-17 NOTE — ED Notes (Signed)
Patient states she is not taking all of the medications that have been prescribed to her and that she does not take them as directed due to the cost.  Very argumentative to granddaughter in room about her medications and her compliance with her medications.  States she has not taken her insulin in several days and does not use lasix daily.

## 2011-05-17 NOTE — ED Notes (Signed)
Patient states she woke up this morning at 0800 and could not use her right arm.  States she went to bed last night at 2300 and was normal.  Patient has past hx of stroke 2007.  Patient c/o right hand and arm numbness and mild swelling noted, patient is able to stand and ambulate to bed from wheelchair, speech is normal for pt and has multiple cardiac symptoms.

## 2011-05-17 NOTE — ED Notes (Signed)
Pt heard arguing with family at bedside. Pt's family left and a few minutes later the pt came to the door and stated "I want to talk to the doctor. I am going home." Dr. Brooke Dare at bedside talking with pt. Pt very argumentative and insists she is going home and does not want to be admitted. Conard Novak, RN at bedside. Pt insists on leaving. AMA signature obtained. Pt CAOx4 and sts that she understands that she may have had a stroke and sts "I've had lots of these strokes." Pt ambulated out of ER unassisted walking with cane. Granddaughter accompanied pt out of ER.

## 2011-05-18 ENCOUNTER — Telehealth: Payer: Self-pay | Admitting: Cardiovascular Disease

## 2011-05-18 ENCOUNTER — Encounter (HOSPITAL_COMMUNITY): Payer: Self-pay

## 2011-05-18 ENCOUNTER — Inpatient Hospital Stay (HOSPITAL_COMMUNITY)
Admission: EM | Admit: 2011-05-18 | Discharge: 2011-05-22 | DRG: 064 | Disposition: A | Discharge status: Home / Self Care | Payer: Medicare Other | Attending: Family Medicine | Admitting: Family Medicine

## 2011-05-18 ENCOUNTER — Other Ambulatory Visit: Payer: Self-pay

## 2011-05-18 ENCOUNTER — Emergency Department (HOSPITAL_COMMUNITY): Payer: Medicare Other

## 2011-05-18 DIAGNOSIS — Z7902 Long term (current) use of antithrombotics/antiplatelets: Secondary | ICD-10-CM

## 2011-05-18 DIAGNOSIS — J45909 Unspecified asthma, uncomplicated: Secondary | ICD-10-CM | POA: Diagnosis present

## 2011-05-18 DIAGNOSIS — R209 Unspecified disturbances of skin sensation: Secondary | ICD-10-CM | POA: Diagnosis present

## 2011-05-18 DIAGNOSIS — R488 Other symbolic dysfunctions: Secondary | ICD-10-CM | POA: Diagnosis present

## 2011-05-18 DIAGNOSIS — I6350 Cerebral infarction due to unspecified occlusion or stenosis of unspecified cerebral artery: Principal | ICD-10-CM | POA: Diagnosis present

## 2011-05-18 DIAGNOSIS — I2510 Atherosclerotic heart disease of native coronary artery without angina pectoris: Secondary | ICD-10-CM | POA: Diagnosis present

## 2011-05-18 DIAGNOSIS — Z794 Long term (current) use of insulin: Secondary | ICD-10-CM

## 2011-05-18 DIAGNOSIS — R29898 Other symptoms and signs involving the musculoskeletal system: Secondary | ICD-10-CM | POA: Diagnosis present

## 2011-05-18 DIAGNOSIS — IMO0001 Reserved for inherently not codable concepts without codable children: Secondary | ICD-10-CM | POA: Diagnosis present

## 2011-05-18 DIAGNOSIS — Z951 Presence of aortocoronary bypass graft: Secondary | ICD-10-CM

## 2011-05-18 DIAGNOSIS — I5022 Chronic systolic (congestive) heart failure: Secondary | ICD-10-CM | POA: Diagnosis present

## 2011-05-18 DIAGNOSIS — R079 Chest pain, unspecified: Secondary | ICD-10-CM | POA: Diagnosis present

## 2011-05-18 DIAGNOSIS — E785 Hyperlipidemia, unspecified: Secondary | ICD-10-CM | POA: Diagnosis present

## 2011-05-18 DIAGNOSIS — Z880 Allergy status to penicillin: Secondary | ICD-10-CM

## 2011-05-18 DIAGNOSIS — I10 Essential (primary) hypertension: Secondary | ICD-10-CM | POA: Diagnosis present

## 2011-05-18 DIAGNOSIS — F17200 Nicotine dependence, unspecified, uncomplicated: Secondary | ICD-10-CM | POA: Diagnosis present

## 2011-05-18 DIAGNOSIS — E876 Hypokalemia: Secondary | ICD-10-CM | POA: Diagnosis not present

## 2011-05-18 DIAGNOSIS — I509 Heart failure, unspecified: Secondary | ICD-10-CM | POA: Diagnosis present

## 2011-05-18 DIAGNOSIS — R0789 Other chest pain: Secondary | ICD-10-CM | POA: Diagnosis present

## 2011-05-18 DIAGNOSIS — I252 Old myocardial infarction: Secondary | ICD-10-CM

## 2011-05-18 DIAGNOSIS — I5023 Acute on chronic systolic (congestive) heart failure: Secondary | ICD-10-CM | POA: Diagnosis present

## 2011-05-18 DIAGNOSIS — Z886 Allergy status to analgesic agent status: Secondary | ICD-10-CM

## 2011-05-18 DIAGNOSIS — E781 Pure hyperglyceridemia: Secondary | ICD-10-CM | POA: Diagnosis present

## 2011-05-18 DIAGNOSIS — I639 Cerebral infarction, unspecified: Secondary | ICD-10-CM | POA: Diagnosis present

## 2011-05-18 DIAGNOSIS — Z6828 Body mass index (BMI) 28.0-28.9, adult: Secondary | ICD-10-CM

## 2011-05-18 DIAGNOSIS — Z8673 Personal history of transient ischemic attack (TIA), and cerebral infarction without residual deficits: Secondary | ICD-10-CM

## 2011-05-18 DIAGNOSIS — Z882 Allergy status to sulfonamides status: Secondary | ICD-10-CM

## 2011-05-18 DIAGNOSIS — Z720 Tobacco use: Secondary | ICD-10-CM | POA: Diagnosis present

## 2011-05-18 DIAGNOSIS — Z79899 Other long term (current) drug therapy: Secondary | ICD-10-CM

## 2011-05-18 DIAGNOSIS — Z7982 Long term (current) use of aspirin: Secondary | ICD-10-CM

## 2011-05-18 LAB — CBC
Hemoglobin: 14.4 g/dL (ref 12.0–15.0)
MCH: 29.2 pg (ref 26.0–34.0)
MCHC: 34.2 g/dL (ref 30.0–36.0)
MCV: 87.6 fL (ref 78.0–100.0)
RBC: 4.93 MIL/uL (ref 3.87–5.11)
RDW: 15 % (ref 11.5–15.5)

## 2011-05-18 LAB — CARDIAC PANEL(CRET KIN+CKTOT+MB+TROPI)
CK, MB: 8.1 ng/mL (ref 0.3–4.0)
Total CK: 254 U/L — ABNORMAL HIGH (ref 7–177)
Troponin I: <0.3 ng/mL (ref <0.30)

## 2011-05-18 LAB — BASIC METABOLIC PANEL
BUN: 13 mg/dL (ref 6–23)
Creatinine, Ser: 1.04 mg/dL (ref 0.50–1.10)
GFR calc Af Amer: 64 mL/min — ABNORMAL LOW (ref >90)
GFR calc non Af Amer: 55 mL/min — ABNORMAL LOW (ref >90)

## 2011-05-18 MED ORDER — ASPIRIN 300 MG RE SUPP
300.0000 mg | Freq: Every day | RECTAL | Status: DC
  Filled 2011-05-18 (×4): qty 1

## 2011-05-18 MED ORDER — FUROSEMIDE 20 MG PO TABS
20.0000 mg | ORAL_TABLET | Freq: Two times a day (BID) | ORAL | Status: DC
  Administered 2011-05-19 – 2011-05-22 (×8): 20 mg via ORAL
  Filled 2011-05-18 (×9): qty 1

## 2011-05-18 MED ORDER — DULOXETINE HCL 60 MG PO CPEP
60.0000 mg | ORAL_CAPSULE | Freq: Every day | ORAL | Status: DC
  Administered 2011-05-19 – 2011-05-22 (×4): 60 mg via ORAL
  Filled 2011-05-18 (×4): qty 1

## 2011-05-18 MED ORDER — INSULIN GLARGINE 100 UNIT/ML ~~LOC~~ SOLN
23.0000 [IU] | Freq: Every day | SUBCUTANEOUS | Status: DC
  Administered 2011-05-19 – 2011-05-21 (×3): 23 [IU] via SUBCUTANEOUS
  Filled 2011-05-18: qty 3

## 2011-05-18 MED ORDER — ENOXAPARIN SODIUM 40 MG/0.4ML ~~LOC~~ SOLN
40.0000 mg | SUBCUTANEOUS | Status: DC
  Administered 2011-05-19 – 2011-05-21 (×4): 40 mg via SUBCUTANEOUS
  Filled 2011-05-18 (×5): qty 0.4

## 2011-05-18 MED ORDER — ALBUTEROL SULFATE (5 MG/ML) 0.5% IN NEBU: 2.5000 mg | INHALATION_SOLUTION | RESPIRATORY_TRACT | Status: DC | PRN

## 2011-05-18 MED ORDER — SODIUM CHLORIDE 0.9 % IV SOLN: INTRAVENOUS | Status: DC

## 2011-05-18 MED ORDER — ASPIRIN 81 MG PO CHEW
324.0000 mg | CHEWABLE_TABLET | Freq: Once | ORAL | Status: AC
  Administered 2011-05-18: 324 mg via ORAL
  Filled 2011-05-18: qty 4

## 2011-05-18 MED ORDER — INSULIN ASPART 100 UNIT/ML ~~LOC~~ SOLN
0.0000 [IU] | Freq: Three times a day (TID) | SUBCUTANEOUS | Status: DC
  Administered 2011-05-19: 9 [IU] via SUBCUTANEOUS
  Administered 2011-05-19: 1 [IU] via SUBCUTANEOUS
  Administered 2011-05-19: 5 [IU] via SUBCUTANEOUS
  Administered 2011-05-20: 2 [IU] via SUBCUTANEOUS
  Administered 2011-05-20: 5 [IU] via SUBCUTANEOUS
  Administered 2011-05-21: 1 [IU] via SUBCUTANEOUS
  Administered 2011-05-21: 2 [IU] via SUBCUTANEOUS
  Administered 2011-05-21: 3 [IU] via SUBCUTANEOUS
  Administered 2011-05-22: 2 [IU] via SUBCUTANEOUS
  Filled 2011-05-18: qty 3

## 2011-05-18 MED ORDER — DIPHENHYDRAMINE HCL 25 MG PO CAPS
25.0000 mg | ORAL_CAPSULE | Freq: Four times a day (QID) | ORAL | Status: DC | PRN
  Administered 2011-05-19 – 2011-05-20 (×2): 25 mg via ORAL
  Filled 2011-05-18 (×2): qty 1

## 2011-05-18 MED ORDER — CLOPIDOGREL BISULFATE 75 MG PO TABS
75.0000 mg | ORAL_TABLET | Freq: Every day | ORAL | Status: DC
  Administered 2011-05-19 – 2011-05-22 (×4): 75 mg via ORAL
  Filled 2011-05-18 (×4): qty 1

## 2011-05-18 MED ORDER — FOLIC ACID 1 MG PO TABS
1.0000 mg | ORAL_TABLET | Freq: Every day | ORAL | Status: DC
  Administered 2011-05-19 – 2011-05-22 (×4): 1 mg via ORAL
  Filled 2011-05-18 (×4): qty 1

## 2011-05-18 MED ORDER — PANTOPRAZOLE SODIUM 20 MG PO TBEC
20.0000 mg | DELAYED_RELEASE_TABLET | Freq: Every day | ORAL | Status: DC
  Administered 2011-05-19 – 2011-05-22 (×4): 20 mg via ORAL
  Filled 2011-05-18 (×4): qty 1

## 2011-05-18 MED ORDER — POTASSIUM CHLORIDE 10 MEQ/100ML IV SOLN
10.0000 meq | Freq: Once | INTRAVENOUS | Status: AC
  Administered 2011-05-18: 10 meq via INTRAVENOUS
  Filled 2011-05-18: qty 100

## 2011-05-18 MED ORDER — ASPIRIN 325 MG PO TABS
325.0000 mg | ORAL_TABLET | Freq: Every day | ORAL | Status: DC
  Administered 2011-05-19 – 2011-05-22 (×4): 325 mg via ORAL
  Filled 2011-05-18 (×4): qty 1

## 2011-05-18 MED ORDER — SENNOSIDES-DOCUSATE SODIUM 8.6-50 MG PO TABS
1.0000 | ORAL_TABLET | Freq: Every evening | ORAL | Status: DC | PRN
  Filled 2011-05-18: qty 1

## 2011-05-18 MED ORDER — HYDROCODONE-ACETAMINOPHEN 5-325 MG PO TABS
1.0000 | ORAL_TABLET | Freq: Four times a day (QID) | ORAL | Status: DC | PRN
  Administered 2011-05-21: 1 via ORAL
  Filled 2011-05-18: qty 1

## 2011-05-18 MED ORDER — NITROGLYCERIN 0.4 MG SL SUBL
0.4000 mg | SUBLINGUAL_TABLET | SUBLINGUAL | Status: DC | PRN
  Administered 2011-05-18: 0.4 mg via SUBLINGUAL
  Filled 2011-05-18: qty 25

## 2011-05-18 MED ORDER — ACETAMINOPHEN 80 MG PO CHEW: 320.0000 mg | CHEWABLE_TABLET | Freq: Four times a day (QID) | ORAL | Status: DC | PRN

## 2011-05-18 MED ORDER — POTASSIUM CHLORIDE CRYS ER 10 MEQ PO TBCR
10.0000 meq | EXTENDED_RELEASE_TABLET | Freq: Two times a day (BID) | ORAL | Status: DC
  Administered 2011-05-19 – 2011-05-22 (×8): 10 meq via ORAL
  Filled 2011-05-18 (×9): qty 1

## 2011-05-18 MED ORDER — SIMVASTATIN 20 MG PO TABS
20.0000 mg | ORAL_TABLET | Freq: Every day | ORAL | Status: DC
  Administered 2011-05-19 – 2011-05-21 (×3): 20 mg via ORAL
  Filled 2011-05-18 (×4): qty 1

## 2011-05-18 MED ORDER — ONDANSETRON HCL 4 MG/2ML IJ SOLN
4.0000 mg | Freq: Four times a day (QID) | INTRAMUSCULAR | Status: DC | PRN
  Administered 2011-05-20: 4 mg via INTRAVENOUS

## 2011-05-18 MED ORDER — MORPHINE SULFATE 4 MG/ML IJ SOLN
4.0000 mg | Freq: Once | INTRAMUSCULAR | Status: AC
  Administered 2011-05-18: 4 mg via INTRAVENOUS
  Filled 2011-05-18: qty 1

## 2011-05-18 MED ORDER — LISINOPRIL 20 MG PO TABS
20.0000 mg | ORAL_TABLET | Freq: Every day | ORAL | Status: DC
  Administered 2011-05-19 – 2011-05-22 (×4): 20 mg via ORAL
  Filled 2011-05-18 (×4): qty 1

## 2011-05-18 NOTE — ED Provider Notes (Signed)
Medical screening examination/treatment/procedure(s) were conducted as a shared visit with non-physician practitioner(s) and myself.  I personally evaluated the patient during the encounter.  She has had right arm weakness for 2 days and right leg weakness. For one day. She's had intermittent chest pain for several days. She did not take her aspirin home today. She has received in today. Her EKG is not acute today. She will be admitted for further evaluation and treatment. I suspect left brain stroke, and possible unstable angina. We'll discuss treatments with neurology and cardiology, prior to admission.  Flint Melter, MD 05/18/11 1623

## 2011-05-18 NOTE — ED Notes (Signed)
Received report, pt being seen by admitting providers.

## 2011-05-18 NOTE — Telephone Encounter (Signed)
Returned call to pt after reviewing her chart. It is noted that she was in the ER at [facility] yesterday for CVA/CHF. When speaking with the pt she states she is having shortness of breath and every once in a while some chest pain. She did leave the [facility] AMA after they had arranged to transport her to [facility] for further evaluation due to no bed available at the Med Center. She took Lasix 20mg  X 3 tablets last night and again this AM. She reports "everything feels tight". She has not weighted herself. Advised as per Dr Shirlee Latch that she needs to return to the Emergency Room for further evaluation. The pt is agreeable to return to the Emergency Room.

## 2011-05-18 NOTE — ED Notes (Signed)
Pt to be moved to yellow holding, room 25. Pending bed placement.

## 2011-05-18 NOTE — ED Notes (Signed)
Pt passed swallow screen without difficulty. Given snack and drink. Remains on cardiac monitor. Vital signs stable. Family at bedside. No signs of distress at the time.

## 2011-05-18 NOTE — Consult Note (Signed)
CARDIOLOGY CONSULT NOTE  Patient ID: CHARLSEY MORAGNE MRN: [id number] DOB/AGE: [birth year] 66 y.o.  Admit date: 05/18/2011 Primary Physician  Dr. Sandi Mealy Primary Cardiologist   [name] Chief Complaint  Dyspnea, right sided weakness  HPI:  The patient has a complicated history in the last couple of days. Her past history includes CABG in 2007. She reports some heart failure managed medically since that time. She also has had a CVA with some residual right-sided weakness. She actually presented to the emergency room at [facility] yesterday with worsening right-sided weakness. She was thought to have had a CVA. However, she refused admission. She Linford Arnold also had some volume overload. Total CK and her MB was slightly elevated but troponin was normal. Her plan was to talk with [name] to see if she could be referred as an outpatient to a neurologist. She called the office today to have this conversation. She also said that she had some increased dyspnea and had taken some increased Lasix. She has not been describing PND or orthopnea. She does think she's had some increased lower strandy swelling. She also describes some chest discomfort as mentioned below. She called our office and was instructed to come to the emergency room. Here she has been seen by neurology and thought to have had a subacute stroke. Workup is pending. Chest x-ray does not show new edema. We are actually consult did to manage the chest discomfort.  Pt states has had approximately 12 episodes of chest pain in the last 3-6 weeks. She does not ordinarily get chest pain but has recently. It is not exertional as she also gets rest symptoms. Her angina in the past has always been after exertion, not during exertion. The level is a 3/10. It gets better if you press on it. The location is the lower left lateral chest. It is a pressure. The episodes only last 1-2 minutes. Sometimes she takes an aspirin which seems to help. Her previous angina  was substernal and an episode of pain today was substernal but all other episodes were left lateral. The symptoms today were treated with ASA/NTG/oxygen and morphine. The pain resolved. The pain is worse with deep inspiration and she feels she cannot take a deep breath when she gets it.  Past Medical History  Diagnosis Date  . Stroke   . Coronary artery disease   . Ear infection     right  . Diabetes mellitus   . Hypertension   . 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 Asthma   . Bronchitis   . Reflux     Past Surgical History  Procedure Date  . Cardiac surgery   . Joint replacement   . Arthroplasty   . Debridement skin / sq / muscle of trunk     s/p staph infection from CABG 2007  . Cystectomy     Allergies  Allergen Reactions  . Codeine   . Penicillins   . Sulfa Antibiotics     Medications Prior to Admission  Medication Sig Dispense Refill  . clopidogrel (PLAVIX) 75 MG tablet Take 75 mg by mouth daily.      . folic acid (FOLVITE) 1 MG tablet Take 1 mg by mouth daily.      . furosemide (LASIX) 20 MG tablet Take 20 mg by mouth 2 (two) times daily.      Marland Kitchen HYDROcodone-acetaminophen (NORCO) 5-325 MG per tablet Take 1 tablet by mouth every 6 (six) hours as needed.      Marland Kitchen  insulin glargine (LANTUS) 100 UNIT/ML injection Inject 23 Units into the skin at bedtime.       . insulin lispro (HUMALOG) 100 UNIT/ML injection Inject 6-10 Units into the skin 3 (three) times daily before meals. Sliding scale      . lisinopril (PRINIVIL,ZESTRIL) 20 MG tablet Take 20 mg by mouth daily.      . metFORMIN (GLUCOPHAGE) 1000 MG tablet Take 1,000 mg by mouth 2 (two) times daily with a meal.      . pantoprazole (PROTONIX) 20 MG tablet Take 20 mg by mouth daily.        History   Social History  . Marital Status: Divorced    Spouse Name: N/A    Number of Children: 4  . Years of Education: N/A   Occupational History  . Retired  [employer]    [employer]   Social History Main Topics  .  Smoking status: Current Everyday Smoker -- 1.0 packs/day for 50 years    Types: Cigarettes  . Smokeless tobacco: None  . Alcohol Use: No  . Drug Use: No  . Sexually Active: None   Social History Narrative   Lives with grandson 80 years old. She is able to do housework, yard work and run errands without difficulty.    Family History  Problem Relation Age of Onset  . Coronary artery disease Father 45    Unclear if it was an MI  . Stroke Mother 38  . Coronary artery disease Brother 6  . Coronary artery disease Brother 68   Pt states GERD Sx are well-controlled on home meds. She never has melena, BRBPR or hematemesis. She walks with a cane because of spinal stenosis and balance problems. She has significant M-S pain issues but the spinal stenosis hurts the worst in her left back and hip. She does have other pain issues. No recent fevers/chills. She coughs and wheezes on a regular basis. Cough is non-productive.  As stated in the HPI and negative for all other systems.  Physical Exam: Blood pressure 104/67, pulse 80, temperature 98.6 F (37 C), temperature source Oral, resp. rate 20, height 5' (1.524 m), weight 150 lb (68.04 kg), SpO2 98.00%.  GENERAL:  No acute distress HEENT:  Pupils equal round and reactive, fundi not visualized, oral mucosa unremarkable NECK:  No jugular venous distention, waveform within normal limits, carotid upstroke brisk and symmetric, no bruits, no thyromegaly LYMPHATICS:  No cervical, inguinal adenopathy LUNGS:  Clear to auscultation bilaterally BACK:  No CVA tenderness CHEST:  Unremarkable HEART:  PMI not displaced or sustained,S1 and S2 within normal limits, no S3, no S4, no clicks, no rubs, no murmurs ABD:  Flat, positive bowel sounds normal in frequency in pitch, no bruits, no rebound, no guarding, no midline pulsatile mass, no hepatomegaly, no splenomegaly EXT:  2 plus pulses throughout, no edema, no cyanosis no clubbing SKIN:  No rashes no  nodules NEURO:  Cranial nerves II through XII grossly intact, motor decreased right upper and lower strength PSYCH:  Cognitively intact, oriented to person place and time  Labs: Lab Results  Component Value Date   BUN 13 05/18/2011   Lab Results  Component Value Date   CREATININE 1.04 05/18/2011   Lab Results  Component Value Date   NA 139 05/18/2011   K 3.1* 05/18/2011   CL 93* 05/18/2011   CO2 31 05/18/2011   Lab Results  Component Value Date   CKTOTAL 254* 05/18/2011   CKMB 8.1* 05/18/2011  TROPONINI <0.30 05/18/2011   Lab Results  Component Value Date   WBC 8.9 05/18/2011   HGB 15.1* 05/18/2011   HCT 44.1 05/18/2011   MCV 86.6 05/18/2011   PLT 217 05/18/2011   No results found for this basename: CHOL, HDL, LDLCALC, LDLDIRECT, TRIG, CHOLHDL   Lab Results  Component Value Date   ALT 29 05/17/2011   AST 21 05/17/2011   ALKPHOS 100 05/17/2011   BILITOT 0.5 05/17/2011     Radiology:     CXR 1/10 1. Cardiomegaly without pulmonary edema.  2. Improved aeration in the right lung base with persistent mild patchy density.   EKG:  NSR rate 94, abnormal P wave axis, RAD, possible RAE and LAE, no acute ST T wave changes.  Suspect lead reversal.   ASSESSMENT AND PLAN:  1)  Chest pain:  The patient has is atypical. CK-MB is nondiagnostic. Troponin has been negative. At this point I do not suspect any acute coronary syndrome. We will cycle cardiac enzymes. I will be checking an echocardiogram to compare to previous. However, I doubt that further noninvasive or invasive imaging would be indicated.  2)  CHF:  She does not appear overtly volume overloaded at this point. She took extra diuretics yesterday. We should watch strict I.'s and O.'s. I will check the echocardiogram as above. She will otherwise continue her previous medications.  SignedRollene Rotunda 05/18/2011, 6:46 PM

## 2011-05-18 NOTE — ED Provider Notes (Signed)
1:53 PM  --  medical screening exam  Patient presents today, sent in by [facility] cardiology. Apparently, was seen yesterday at [facility] [place] and was diagnosed with probable stroke. She apparently left against medical device at that time. She states she is on Plavix and has had multiple strokes in the past.   She called  [name] today, and someone told her that her "cardiac enzymes were positive.  Patient was instructed to come back to the emergency department. Her labs and x-rays were briefly reviewed. Her troponin yesterday was apparently less than 0.3. Her head CT showed no acute stroke. However, she states she cannot move her right hand for the past 48 hours.   Still complains of occasional chest pain.  Basic labs have been ordered. EKG has been reviewed. She already has a bed in pod 8 will be going straight back. Her vital signs appear to be stable with mild hypotension noted    RRR   CTA b   No pronator drift   No C/C/E   Arkin Imran A. Patrica Duel, MD 05/18/11 1356

## 2011-05-18 NOTE — ED Notes (Signed)
Active chest pain,  With positive enzymes, pt. Has nausea  And sob.

## 2011-05-18 NOTE — H&P (Signed)
PATIENT DETAILS Name: [name] Age: 66 y.o. Sex: female Date of Birth: Jun 16, 1945 Admit Date: 05/18/2011 PCP:ALM,STEPHANIE, MD, MD   CHIEF COMPLAINT:  Right-sided weakness  HPI: Patient is a very pleasant 66 year old Caucasian female with a past medical history of CVA, coronary artery disease status post CABG, ongoing tobacco abuse, hypertension comes in with the above-noted complaints. This [facility] with right-sided weakness that she noticed when she woke up, she was last seen normal when she went to sleep the day before, it was thought that the patient had a acute CVA and was a referred for admission, however she signed out AGAINST MEDICAL ADVICE. She got a call from her cardiologist's office, it was explained to her that her cardiac enzymes was also high and that she should go to the ED for further evaluation, hence she returned to the emergency room today. She continues to have a right upper extremity and right lower extremity weakness. She also claims to have had for the past few days left-sided chest pain. She is now being referred to the hospitalist service for admission of further evaluation and treatment.   ALLERGIES:   Allergies  Allergen Reactions  . Codeine   . Penicillins   . Sulfa Antibiotics     PAST MEDICAL HISTORY: Past Medical History  Diagnosis Date  . Stroke   . Coronary artery disease   . Ear infection     right  . Diabetes mellitus   . Hypertension   . 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 Asthma   . Bronchitis   . Reflux     PAST SURGICAL HISTORY: Past Surgical History  Procedure Date  . Cardiac surgery   . Joint replacement   . Arthroplasty   . Debridement skin / sq / muscle of trunk     s/p staph infection from CABG 2007  . Cystectomy     MEDICATIONS AT HOME: Prior to Admission medications   Medication Sig Start Date End Date Taking? Authorizing Provider  albuterol  (PROVENTIL HFA;VENTOLIN HFA) 108 (90 BASE) MCG/ACT inhaler Inhale 2 puffs into the lungs every 6 (six) hours as needed.   Yes Historical Provider, MD  albuterol (PROVENTIL) (5 MG/ML) 0.5% nebulizer solution Take 2.5 mg by nebulization every 6 (six) hours as needed. For shortness of breath   Yes Historical Provider, MD  aspirin 325 MG tablet Take 325 mg by mouth daily.   Yes Historical Provider, MD  clopidogrel (PLAVIX) 75 MG tablet Take 75 mg by mouth daily.   Yes Historical Provider, MD  DULoxetine (CYMBALTA) 60 MG capsule Take 60 mg by mouth daily.   Yes Historical Provider, MD  folic acid (FOLVITE) 1 MG tablet Take 1 mg by mouth daily.   Yes Historical Provider, MD  furosemide (LASIX) 20 MG tablet Take 20 mg by mouth 2 (two) times daily.   Yes Historical Provider, MD  HYDROcodone-acetaminophen (NORCO) 5-325 MG per tablet Take 1 tablet by mouth every 6 (six) hours as needed.   Yes Historical Provider, MD  insulin glargine (LANTUS) 100 UNIT/ML injection Inject 23 Units into the skin at bedtime.    Yes Historical Provider, MD  insulin lispro (HUMALOG) 100 UNIT/ML injection Inject 6-10 Units into the skin 3 (three) times daily before meals. Sliding scale   Yes Historical Provider, MD  lisinopril (PRINIVIL,ZESTRIL) 20 MG tablet Take 20 mg by mouth daily.   Yes Historical Provider, MD  metFORMIN (GLUCOPHAGE)  1000 MG tablet Take 1,000 mg by mouth 2 (two) times daily with a meal.   Yes Historical Provider, MD  pantoprazole (PROTONIX) 20 MG tablet Take 20 mg by mouth daily.   Yes Historical Provider, MD  potassium chloride (K-DUR,KLOR-CON) 10 MEQ tablet Take 10 mEq by mouth 2 (two) times daily.   Yes Historical Provider, MD    FAMILY HISTORY: No family history on file.  SOCIAL HISTORY:  reports that she has been smoking Cigarettes.  She has a 50 pack-year smoking history. She does not have any smokeless tobacco history on file. She reports that she does not drink alcohol or use illicit drugs.  REVIEW  OF SYSTEMS:  Constitutional:   No  weight loss, night sweats,  Fevers, chills, fatigue.  HEENT:    No headaches, Difficulty swallowing,Tooth/dental problems,Sore throat,  No sneezing, itching, ear ache, nasal congestion, post nasal drip,   Cardio-vascular:  Orthopnea, PND, swelling in lower extremities, anasarca, dizziness, palpitations  GI:  No heartburn, indigestion, abdominal pain, nausea, vomiting, diarrhea, change in  bowel habits, loss of appetite  Resp: No shortness of breath with exertion or at rest.  No excess mucus, no productive cough, No non-productive cough,  No coughing up of blood.No change in color of mucus.No wheezing.No chest wall deformity  Skin:  no rash or lesions.  GU:  no dysuria, change in color of urine, no urgency or frequency.  No flank pain.  Musculoskeletal: No joint pain or swelling.  No decreased range of motion.  No back pain.  Psych: No change in mood or affect. No depression or anxiety.  No memory loss.   PHYSICAL EXAM: Blood pressure 104/67, pulse 80, temperature 98.6 F (37 C), temperature source Oral, resp. rate 20, height 5' (1.524 m), weight 68.04 kg (150 lb), SpO2 98.00%.  General appearance :Awake, alert, not in any distress. Speech Clear. Not toxic Looking HEENT: Atraumatic and Normocephalic, pupils equally reactive to light and accomodation Neck: supple, no JVD. No cervical lymphadenopathy.  Chest:Good air entry bilaterally, no added sounds  CVS: S1 S2 regular, no murmurs.  Abdomen: Bowel sounds present, Non tender and not distended with no gaurding, rigidity or rebound. Extremities: B/L Lower Ext shows no edema, both legs are warm to touch, with  dorsalis pedis pulses palpable. Neurology: Awake alert, and oriented X 3, right-sided weakness around 4/5 in both the upper and lower extremities. Skin:No Rash Wounds:N/A  LABS ON ADMISSION:   Basename 05/18/11 1404 05/17/11 1358  NA 139 141  K 3.1* 3.4*  CL 93* 100  CO2 31 30    GLUCOSE 306* 171*  BUN 13 11  CREATININE 1.04 0.90  CALCIUM 9.6 9.5  MG -- --  PHOS -- --    Basename 05/17/11 1358  AST 21  ALT 29  ALKPHOS 100  BILITOT 0.5  PROT 7.5  ALBUMIN 3.2*   No results found for this basename: LIPASE:2,AMYLASE:2 in the last 72 hours  Basename 05/18/11 1404 05/17/11 1358  WBC 8.9 8.0  NEUTROABS -- 5.1  HGB 15.1* 14.4  HCT 44.1 43.3  MCV 86.6 86.3  PLT 217 194    Basename 05/18/11 1404 05/17/11 1358  CKTOTAL 254* 356*  CKMB 8.1* 11.3*  CKMBINDEX -- --  TROPONINI <0.30 <0.30   No results found for this basename: DDIMER:2 in the last 72 hours No components found with this basename: POCBNP:3   RADIOLOGIC STUDIES ON ADMISSION: Dg Chest 2 View  05/18/2011  *RADIOLOGY REPORT*  Clinical Data: Left chest pain.  Shortness of breath for several weeks.  General weakness for 6-8 weeks.  History of bypass surgery, smoking.  History of stroke on 05/16/2011.  CHEST - 2 VIEW  Comparison: 05/17/2011  Findings: Patient has had median sternotomy and CABG.  The heart is enlarged.  There is minimal residual density at the right lung base but there has been improvement in aeration in the right lower lobe. No pleural effusions or new infiltrates.  Degenerative changes are seen in the spine.  IMPRESSION:  1.  Cardiomegaly without pulmonary edema. 2.  Improved aeration in the right lung base with persistent mild patchy density.  Original Report Authenticated By: Patterson Hammersmith, M.D.   Dg Chest 2 View  05/17/2011  *RADIOLOGY REPORT*  Clinical Data: Unable to use right hand/arm, numbness, swelling, history stroke, coronary disease, diabetes, hypertension, MI, asthma, bronchitis  CHEST - 2 VIEW  Comparison: 02/01/2010  Findings: Enlargement of cardiac silhouette post CABG. Atherosclerotic calcification aorta. Mediastinal contours and pulmonary vascularity normal. Emphysematous changes with right basilar infiltrate or atelectasis. No definite pleural effusion or  pneumothorax. Broad-based thoracolumbar scoliosis. Osseous demineralization.  IMPRESSION: Enlargement of cardiac silhouette post CABG. Atelectasis versus infiltrate right lung base.  Original Report Authenticated By: Lollie Marrow, M.D.   Ct Head Wo Contrast  05/17/2011  *RADIOLOGY REPORT*  Clinical Data: Headache.  Dizziness.  Unable to move right hand. Previous stroke.  CT HEAD WITHOUT CONTRAST  Technique:  Contiguous axial images were obtained from the base of the skull through the vertex without contrast.  Comparison: 03/13/ 2006  Findings: There is no evidence of intracranial hemorrhage, brain edema or other signs of acute infarction.  There is no evidence of intracranial mass lesion or mass effect.  No abnormal extra-axial fluid collections are identified.  Ventricles normal in size.  Mild chronic small vessel disease is noted.  No other intracranial abnormality identified.  No skull fracture or other bone abnormality.  IMPRESSION:  1.  No acute intracranial abnormality. 2.  Mild chronic small vessel disease.  Original Report Authenticated By: Danae Orleans, M.D.    ASSESSMENT AND PLAN: Present on Admission:  .CVA (cerebral infarction)-likely subacute  -Not a candidate for TPA given late presentation  -Will continue with aspirin and Plavix she that she already is on, will order MRI of the brain and other studies in standard fashion the stroke workup.  -Appreciate neurology input.   .Chest pain -Does have a significant history of coronary artery disease, however there are a lot of atypical features to the pain. In any event she will be admitted to a telemetry unit and cardiac enzymes will be cycled.  -Per EDP-cardiology has already been consulted, we are awaiting the evaluation  .CAD (coronary artery disease) -We'll continue with antiplatelet agents and statin.   Marland KitchenHTN (hypertension) -Will continue with her usual antihypertensive medications.   .Dyslipidemia -Statin.   .Tobacco  abuse -Have counseled extensively. -Tobacco cessation consult  Further plan will depend as patient's clinical course evolves and further radiologic and laboratory data become available. Patient will be monitored closely.  DVT Prophylaxis: -Lovenox  Code Status: -Full code  Total time spent for admission equals 45 minutes.  Jeoffrey Massed 05/18/2011, 6:36 PM

## 2011-05-18 NOTE — Consult Note (Signed)
Chief Complaint:   Weakness involving the right face arm and leg.  HPI: [name] is an 66 y.o. female with a history of 2 previous cerebral infarctions, presenting with new onset of weakness involving the right face arm and leg. Patient woke up with symptoms yesterday morning. She was last seen normal at bedtime along 05/16/2011. She sought medical attention at [facility] yesterday but refused admission. Weakness involving her right leg was worse today. Speech apparently is less slurred. She's also been experiencing chest pain which is worse today which prompted her to seek emergency evaluation. She is known history of coronary artery disease with bypass surgery, as well as hypertension and diabetes mellitus. NIH stroke score was 6. Patient was not a candidate for thrombolytic therapy because of the amount of time elapsed since the onset of her symptoms. Patient has been on aspirin and Plavix for antiplatelet therapy.  LSN: 05/16/2011 at 2300 tPA Given: No:  MRankin: 1  Past Medical History  Diagnosis Date  . Stroke   . Coronary artery disease   . Ear infection     right  . Diabetes mellitus   . Hypertension   . 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 Asthma   . Bronchitis   . Reflux     Past Surgical History  Procedure Date  . Cardiac surgery   . Joint replacement   . Arthroplasty   . Debridement skin / sq / muscle of trunk     s/p staph infection from CABG 2007  . Cystectomy     No family history on file. Social History:  reports that she has been smoking Cigarettes.  She has a 50 pack-year smoking history. She does not have any smokeless tobacco history on file. She reports that she does not drink alcohol or use illicit drugs.  Allergies:  Allergies  Allergen Reactions  . Codeine   . Penicillins   . Sulfa Antibiotics     Medications: Prior to Admission:  (Not in a hospital admission)   Physical Examination: Blood pressure 119/61,  pulse 86, temperature 98.6 F (37 C), temperature source Oral, resp. rate 20, height 5' (1.524 m), weight 68.04 kg (150 lb), SpO2 95.00%.  Neurologic Examination: Mental Status: Alert, oriented, thought content appropriate.  Speech fluent without evidence of aphasia. Able to follow 3 step commands without difficulty. Cranial Nerves: II-Visual fields intact. III/IV/VI-Extraocular movements normal.  Pupils reactive bilaterally. V/VII-right lower facial numbness to tactile stimulation, as well as mild right lower facial weakness. VIII-grossly intact X-reduced palatal elevation on the right; speech was minimally dysarthric. XII-midline tongue extension Motor: Mild proximal and moderately severe distal right upper extremity weakness, as well as mild proximal right lower extremity weakness. Sensory: Reduced perception of tactile sensation over right upper and lower extremities compared to left extremities. Deep Tendon Reflexes: 2+ on the left and absent on the right. Plantars: Mute bilaterally Cerebellar: Slight coordination difficult involving right upper extremity, commensurate with degree of weakness present. Carotid auscultation: revealed a left carotid bruit.   Results for orders placed during the hospital encounter of 05/18/11 (from the past 48 hour(s))  CARDIAC PANEL(CRET KIN+CKTOT+MB+TROPI)     Status: Abnormal   Collection Time   05/18/11  2:04 PM      Component Value Range Comment   Total CK 254 (*) 7 - 177 (U/L)    CK, MB 8.1 (*) 0.3 - 4.0 (ng/mL)    Troponin I <0.30  <  0.30 (ng/mL)    Relative Index 3.2 (*) 0.0 - 2.5    BASIC METABOLIC PANEL     Status: Abnormal   Collection Time   05/18/11  2:04 PM      Component Value Range Comment   Sodium 139  135 - 145 (mEq/L)    Potassium 3.1 (*) 3.5 - 5.1 (mEq/L)    Chloride 93 (*) 96 - 112 (mEq/L)    CO2 31  19 - 32 (mEq/L)    Glucose, Bld 306 (*) 70 - 99 (mg/dL)    BUN 13  6 - 23 (mg/dL)    Creatinine, Ser 4.54  0.50 - 1.10 (mg/dL)     Calcium 9.6  8.4 - 10.5 (mg/dL)    GFR calc non Af Amer 55 (*) >90 (mL/min)    GFR calc Af Amer 64 (*) >90 (mL/min)   CBC     Status: Abnormal   Collection Time   05/18/11  2:04 PM      Component Value Range Comment   WBC 8.9  4.0 - 10.5 (K/uL)    RBC 5.09  3.87 - 5.11 (MIL/uL)    Hemoglobin 15.1 (*) 12.0 - 15.0 (g/dL)    HCT 09.8  11.9 - 14.7 (%)    MCV 86.6  78.0 - 100.0 (fL)    MCH 29.7  26.0 - 34.0 (pg)    MCHC 34.2  30.0 - 36.0 (g/dL)    RDW 82.9  56.2 - 13.0 (%)    Platelets 217  150 - 400 (K/uL)    Dg Chest 2 View  05/18/2011  *RADIOLOGY REPORT*  Clinical Data: Left chest pain.  Shortness of breath for several weeks.  General weakness for 6-8 weeks.  History of bypass surgery, smoking.  History of stroke on 05/16/2011.  CHEST - 2 VIEW  Comparison: 05/17/2011  Findings: Patient has had median sternotomy and CABG.  The heart is enlarged.  There is minimal residual density at the right lung base but there has been improvement in aeration in the right lower lobe. No pleural effusions or new infiltrates.  Degenerative changes are seen in the spine.  IMPRESSION:  1.  Cardiomegaly without pulmonary edema. 2.  Improved aeration in the right lung base with persistent mild patchy density.  Original Report Authenticated By: Patterson Hammersmith, M.D.   Dg Chest 2 View  05/17/2011  *RADIOLOGY REPORT*  Clinical Data: Unable to use right hand/arm, numbness, swelling, history stroke, coronary disease, diabetes, hypertension, MI, asthma, bronchitis  CHEST - 2 VIEW  Comparison: 02/01/2010  Findings: Enlargement of cardiac silhouette post CABG. Atherosclerotic calcification aorta. Mediastinal contours and pulmonary vascularity normal. Emphysematous changes with right basilar infiltrate or atelectasis. No definite pleural effusion or pneumothorax. Broad-based thoracolumbar scoliosis. Osseous demineralization.  IMPRESSION: Enlargement of cardiac silhouette post CABG. Atelectasis versus infiltrate right lung  base.  Original Report Authenticated By: Lollie Marrow, M.D.   Ct Head Wo Contrast  05/17/2011  *RADIOLOGY REPORT*  Clinical Data: Headache.  Dizziness.  Unable to move right hand. Previous stroke.  CT HEAD WITHOUT CONTRAST  Technique:  Contiguous axial images were obtained from the base of the skull through the vertex without contrast.  Comparison: 03/13/ 2006  Findings: There is no evidence of intracranial hemorrhage, brain edema or other signs of acute infarction.  There is no evidence of intracranial mass lesion or mass effect.  No abnormal extra-axial fluid collections are identified.  Ventricles normal in size.  Mild chronic small vessel disease is noted.  No other intracranial abnormality identified.  No skull fracture or other bone abnormality.  IMPRESSION:  1.  No acute intracranial abnormality. 2.  Mild chronic small vessel disease.  Original Report Authenticated By: Danae Orleans, M.D.    Assessment: 66 y.o. female recurrent right acute ischemic cerebral infarction with recurrent left hemiparesthesias. Patient has significant stroke risk factors and his listed below. 2. Left carotid bruit, likely indicative of left carotid stenosis and possible embolus source for recurrent left cerebral infarctions.  Stroke Risk Factors - diabetes mellitus, hyperlipidemia, hypertension and smoking  Plan: 1. HgbA1c, fasting lipid panel 2. MRI, MRA  of the brain without contrast 3. PT consult, OT consult, Speech consult 4. Echocardiogram 5. Carotid dopplers 6. Prophylactic therapy-Antiplatelet med: Aspirin - 81 mg per day and Antiplatelet med: Plavix - dose 75 mg per day 7. Risk factor modification 8. Telemetry monitoring   C.R. Roseanne Reno, MD [facility]  05/18/2011, 5:19 PM

## 2011-05-18 NOTE — ED Notes (Signed)
[id number] 

## 2011-05-18 NOTE — ED Notes (Signed)
Pt presents to department for evaluation of abnormal cardiac enzymes. Was seen at [facility] yesterday for evaluation of stroke. Pt states intermittent chest pain x3 weeks, also states SOB and palpitations. Denies chest pain at the present. Respirations unlabored. Skin warm and dry. She is alert and oriented x4. Remains on cardiac monitor. Family at the bedside. No signs of distress at the time.

## 2011-05-18 NOTE — Telephone Encounter (Signed)
New problem Pt called she said she has chf and she has starting taking lasix. She has been having some sob and chest pain that comes and goes. Please call

## 2011-05-18 NOTE — ED Notes (Signed)
Pt. Also had a stroke yesterday and she is unable to straighten her rt. Hand , unable to flex her rt. Arm,.  Also  Reports that her rt. Leg is weak. .  Pt. Did leave AMA yesterday from [facility], She new at that me her cardiac enzymes were positive

## 2011-05-18 NOTE — ED Notes (Signed)
Pt resting quietly at the time. Remains on cardiac monitor. Vital signs stable. Family at the bedside. No signs of distress at the present.

## 2011-05-18 NOTE — ED Provider Notes (Signed)
History     CSN: [id number]  Arrival date & time 05/18/11  1331   First MD Initiated Contact with Patient 05/18/11 1348      Chief Complaint  Patient presents with  . Chest Pain    (Consider location/radiation/quality/duration/timing/severity/associated sxs/prior treatment) HPI Comments: Patient was seen yesterday at [facility] with increasing dyspnea and suspected stroke.  Patient left AMA because she has had strokes in the past and wanted to follow up with Dr Elease Hashimoto.  Today, [facility]ology told her to come to the ED for further evaluation.    Patient states the symptoms began yesterday when she woke up, she realized that she was not able to control her right hand and had decreased sensation throughout her right side.  States she was attempting to pour tea and poured it all over the floor.  States these symptoms are unchanged today.  Head CT showed no acute change yesterday.  Patient also notes that she has become increasingly short of breath with any exertion x several weeks, and feels she has been fluid overloaded x months with increased bilateral leg swelling.  States she cannot walk around her small house without stopping to rest.  Today she is feeling tired, which she states is uncommon for her.      PCP Alm Eagle  Patient is a 67 y.o. female presenting with chest pain. The history is provided by the patient and medical records.  Chest Pain Pertinent negatives for primary symptoms include no abdominal pain.     Past Medical History  Diagnosis Date  . Stroke   . Coronary artery disease   . Ear infection     right  . Diabetes mellitus   . Hypertension   . 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 Asthma   . Bronchitis   . Reflux     Past Surgical History  Procedure Date  . Cardiac surgery   . Joint replacement   . Arthroplasty   . Debridement skin / sq / muscle of trunk     s/p staph infection from CABG 2007  . Cystectomy     No family  history on file.  History  Substance Use Topics  . Smoking status: Current Everyday Smoker -- 1.0 packs/day for 50 years    Types: Cigarettes  . Smokeless tobacco: Not on file  . Alcohol Use: No    OB History    Grav Para Term Preterm Abortions TAB SAB Ect Mult Living                  Review of Systems  Cardiovascular: Positive for chest pain.  Gastrointestinal: Negative for abdominal pain.  All other systems reviewed and are negative.    Allergies  Codeine; Penicillins; and Sulfa antibiotics  Home Medications   Current Outpatient Rx  Name Route Sig Dispense Refill  . ALBUTEROL SULFATE HFA 108 (90 BASE) MCG/ACT IN AERS Inhalation Inhale 2 puffs into the lungs every 6 (six) hours as needed.    . ALBUTEROL SULFATE (5 MG/ML) 0.5% IN NEBU Nebulization Take 2.5 mg by nebulization every 6 (six) hours as needed. For shortness of breath    . ASPIRIN 325 MG PO TABS Oral Take 325 mg by mouth daily.    Marland Kitchen CLOPIDOGREL BISULFATE 75 MG PO TABS Oral Take 75 mg by mouth daily.    . DULOXETINE HCL 60 MG PO CPEP Oral Take 60 mg by mouth daily.    Marland Kitchen FOLIC  ACID 1 MG PO TABS Oral Take 1 mg by mouth daily.    . FUROSEMIDE 20 MG PO TABS Oral Take 20 mg by mouth 2 (two) times daily.    Marland Kitchen HYDROCODONE-ACETAMINOPHEN 5-325 MG PO TABS Oral Take 1 tablet by mouth every 6 (six) hours as needed.    . INSULIN GLARGINE 100 UNIT/ML [place] SOLN Subcutaneous Inject 23 Units into the skin at bedtime.     . INSULIN LISPRO (HUMAN) 100 UNIT/ML [place] SOLN Subcutaneous Inject 6-10 Units into the skin 3 (three) times daily before meals. Sliding scale    . LISINOPRIL 20 MG PO TABS Oral Take 20 mg by mouth daily.    Marland Kitchen METFORMIN HCL 1000 MG PO TABS Oral Take 1,000 mg by mouth 2 (two) times daily with a meal.    . PANTOPRAZOLE SODIUM 20 MG PO TBEC Oral Take 20 mg by mouth daily.    Marland Kitchen POTASSIUM CHLORIDE CRYS ER 10 MEQ PO TBCR Oral Take 10 mEq by mouth 2 (two) times daily.      BP 93/70  Pulse 92  Temp(Src) 98.5 F (36.9 C)  (Oral)  Resp 16  Ht 5' (1.524 m)  Wt 150 lb (68.04 kg)  BMI 29.30 kg/m2  SpO2 95%  Physical Exam  Nursing note and vitals reviewed. Constitutional: She is oriented to person, place, and time. She appears well-developed and well-nourished.  HENT:  Head: Normocephalic and atraumatic.  Neck: Neck supple.  Cardiovascular: Normal rate, regular rhythm and normal heart sounds.   Pulmonary/Chest: Breath sounds normal. No respiratory distress. She has no wheezes. She has no rales. She exhibits no tenderness.  Abdominal: Soft. Bowel sounds are normal. She exhibits no distension. There is no tenderness. There is no rebound and no guarding.  Musculoskeletal:       Distal pulses intact and equal bilaterally.   Neurological: She is alert and oriented to person, place, and time.       Right hand decreased strength, gross motor deficit.  Right leg lifts against gravity only and with great effort.      ED Course  Procedures (including critical care time)  Labs Reviewed  CARDIAC PANEL(CRET KIN+CKTOT+MB+TROPI) - Abnormal; Notable for the following:    Total CK 254 (*)    CK, MB 8.1 (*)    Relative Index 3.2 (*)    All other components within normal limits  BASIC METABOLIC PANEL - Abnormal; Notable for the following:    Potassium 3.1 (*)    Chloride 93 (*)    Glucose, Bld 306 (*)    GFR calc non Af Amer 55 (*)    GFR calc Af Amer 64 (*)    All other components within normal limits  CBC - Abnormal; Notable for the following:    Hemoglobin 15.1 (*)    All other components within normal limits   Dg Chest 2 View  05/18/2011  *RADIOLOGY REPORT*  Clinical Data: Left chest pain.  Shortness of breath for several weeks.  General weakness for 6-8 weeks.  History of bypass surgery, smoking.  History of stroke on 05/16/2011.  CHEST - 2 VIEW  Comparison: 05/17/2011  Findings: Patient has had median sternotomy and CABG.  The heart is enlarged.  There is minimal residual density at the right lung base but  there has been improvement in aeration in the right lower lobe. No pleural effusions or new infiltrates.  Degenerative changes are seen in the spine.  IMPRESSION:  1.  Cardiomegaly without pulmonary edema.  2.  Improved aeration in the right lung base with persistent mild patchy density.  Original Report Authenticated By: Patterson Hammersmith, M.D.   Dg Chest 2 View  05/17/2011  *RADIOLOGY REPORT*  Clinical Data: Unable to use right hand/arm, numbness, swelling, history stroke, coronary disease, diabetes, hypertension, MI, asthma, bronchitis  CHEST - 2 VIEW  Comparison: 02/01/2010  Findings: Enlargement of cardiac silhouette post CABG. Atherosclerotic calcification aorta. Mediastinal contours and pulmonary vascularity normal. Emphysematous changes with right basilar infiltrate or atelectasis. No definite pleural effusion or pneumothorax. Broad-based thoracolumbar scoliosis. Osseous demineralization.  IMPRESSION: Enlargement of cardiac silhouette post CABG. Atelectasis versus infiltrate right lung base.  Original Report Authenticated By: Lollie Marrow, M.D.   Ct Head Wo Contrast  05/17/2011  *RADIOLOGY REPORT*  Clinical Data: Headache.  Dizziness.  Unable to move right hand. Previous stroke.  CT HEAD WITHOUT CONTRAST  Technique:  Contiguous axial images were obtained from the base of the skull through the vertex without contrast.  Comparison: 03/13/ 2006  Findings: There is no evidence of intracranial hemorrhage, brain edema or other signs of acute infarction.  There is no evidence of intracranial mass lesion or mass effect.  No abnormal extra-axial fluid collections are identified.  Ventricles normal in size.  Mild chronic small vessel disease is noted.  No other intracranial abnormality identified.  No skull fracture or other bone abnormality.  IMPRESSION:  1.  No acute intracranial abnormality. 2.  Mild chronic small vessel disease.  Original Report Authenticated By: Danae Orleans, M.D.   3:43 PM Patient seen  and examined, discussed with Dr Effie Shy who will also see the patient.  Discussed cardiac markers with Dr Effie Shy- we have reviewed these and they are improved from yesterday.  Troponin remains negative. EKG unchanged from yesterday, lateral t wave changes since 2007.    Date: 05/18/2011  Rate: 94   Rhythm: normal sinus rhythm  QRS Axis: normal  Intervals: QT prolonged  ST/T Wave abnormalities: t wave inversions  Conduction Disutrbances:none  Narrative Interpretation:   Old EKG Reviewed: changes noted - no significant changes since yesterday.  t wave inversions laterally since Oct 2007 ekg.   4:48 PM Discussed patient with [facility] cardiology, who will have someone see the patient in consult.   4:49 PM I have spoken with Dr Roseanne Reno, neurology, who will consult on patient.    1. Stroke   2. Chest pain       MDM  Patient with suspected right sided stroke, active left sided chest pain and elevated CKMB.  Pt admitted to [facility] with neurology and cardiology already consulted and to see patient in ED.  CT yesterday showed no acute findings. MRI of brain not ordered because of patient's active chest pain and possible elevation of cardiac markers.  Will defer to cardiology and neurology as well as medicine for further coordination of evaluation and treatment.          Dillard Cannon [place], [place] 05/19/11 1930

## 2011-05-19 ENCOUNTER — Other Ambulatory Visit (HOSPITAL_COMMUNITY): Payer: Medicare Other

## 2011-05-19 DIAGNOSIS — I2510 Atherosclerotic heart disease of native coronary artery without angina pectoris: Secondary | ICD-10-CM

## 2011-05-19 DIAGNOSIS — I519 Heart disease, unspecified: Secondary | ICD-10-CM

## 2011-05-19 DIAGNOSIS — I5023 Acute on chronic systolic (congestive) heart failure: Secondary | ICD-10-CM

## 2011-05-19 LAB — CARDIAC PANEL(CRET KIN+CKTOT+MB+TROPI)
CK, MB: 7.5 ng/mL (ref 0.3–4.0)
Relative Index: 3.2 — ABNORMAL HIGH (ref 0.0–2.5)
Total CK: 236 U/L — ABNORMAL HIGH (ref 7–177)
Total CK: 206 U/L — ABNORMAL HIGH (ref 7–177)
Troponin I: <0.3 ng/mL (ref <0.30)

## 2011-05-19 LAB — LIPID PANEL: Cholesterol: 248 mg/dL — ABNORMAL HIGH (ref 0–200)

## 2011-05-19 LAB — GLUCOSE, CAPILLARY
Glucose-Capillary: 402 mg/dL — ABNORMAL HIGH (ref 70–99)
Glucose-Capillary: 270 mg/dL — ABNORMAL HIGH (ref 70–99)

## 2011-05-19 LAB — HEMOGLOBIN A1C
Hgb A1c MFr Bld: 9.8 % — ABNORMAL HIGH (ref <5.7)
Mean Plasma Glucose: 209 mg/dL — ABNORMAL HIGH (ref <117)

## 2011-05-19 LAB — BASIC METABOLIC PANEL
CO2: 33 mEq/L — ABNORMAL HIGH (ref 19–32)
Chloride: 93 mEq/L — ABNORMAL LOW (ref 96–112)
Creatinine, Ser: 1.21 mg/dL — ABNORMAL HIGH (ref 0.50–1.10)
Glucose, Bld: 294 mg/dL — ABNORMAL HIGH (ref 70–99)

## 2011-05-19 MED ORDER — CARVEDILOL 3.125 MG PO TABS
3.1250 mg | ORAL_TABLET | Freq: Two times a day (BID) | ORAL | Status: DC
  Administered 2011-05-19 – 2011-05-22 (×6): 3.125 mg via ORAL
  Filled 2011-05-19 (×8): qty 1

## 2011-05-19 MED ORDER — SPIRONOLACTONE 12.5 MG HALF TABLET
12.5000 mg | ORAL_TABLET | Freq: Every day | ORAL | Status: DC
  Administered 2011-05-19 – 2011-05-22 (×4): 12.5 mg via ORAL
  Filled 2011-05-19 (×4): qty 1

## 2011-05-19 MED ORDER — STROKE: EARLY STAGES OF RECOVERY BOOK
Freq: Once | Status: AC
  Administered 2011-05-19: 04:00:00
  Filled 2011-05-19: qty 1

## 2011-05-19 MED ORDER — INSULIN ASPART 100 UNIT/ML ~~LOC~~ SOLN
3.0000 [IU] | Freq: Once | SUBCUTANEOUS | Status: AC
  Administered 2011-05-19: 3 [IU] via SUBCUTANEOUS
  Filled 2011-05-19: qty 3

## 2011-05-19 NOTE — Consult Note (Signed)
CARDIOLOGY CONSULT NOTE  Patient ID: [name] MRN: [id number] DOB/AGE: 66-13-1947 66 y.o.  Admit date: 05/18/2011 Primary Physician  Dr. Sandi Mealy Primary Cardiologist   [name] Chief Complaint  Dyspnea, right sided weakness  HPI:  The patient has a complicated history in the last couple of days. Her past history includes CABG in 2007. Echo that time EF 50%. Admitted with CVA.  Cardiology consulted due to atypical CP which lasts a second or two. As well as mild CHF.  Echo done this am which I reviewed. Shows EF 15-20% with global LV dysfunction.   Currently feels OK. No CP today. Occasional dyspnea but no orthopnea/PND/edema.   Past Medical History  Diagnosis Date  . Stroke   . Coronary artery disease   . Ear infection     right  . Diabetes mellitus   . Hypertension   . 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 Asthma   . Bronchitis   . Reflux     Past Surgical History  Procedure Date  . Coronary artery bypass graft     LIMA to RI, SVG to distal RCA, SVG to LAD, SVG to OM. 2007  . Joint replacement     Right hip replacement   . Debridement skin / sq / muscle of trunk     s/p staph infection from CABG 2007  . Cystectomy     Subcutaneous    Allergies  Allergen Reactions  . Codeine   . Penicillins   . Sulfa Antibiotics     Medications Prior to Admission  Medication Sig Dispense Refill  . clopidogrel (PLAVIX) 75 MG tablet Take 75 mg by mouth daily.      . folic acid (FOLVITE) 1 MG tablet Take 1 mg by mouth daily.      . furosemide (LASIX) 20 MG tablet Take 20 mg by mouth 2 (two) times daily.      Marland Kitchen HYDROcodone-acetaminophen (NORCO) 5-325 MG per tablet Take 1 tablet by mouth every 6 (six) hours as needed.      . insulin glargine (LANTUS) 100 UNIT/ML injection Inject 23 Units into the skin at bedtime.       . insulin lispro (HUMALOG) 100 UNIT/ML injection Inject 6-10 Units into the skin 3 (three) times daily before meals. Sliding scale      . lisinopril  (PRINIVIL,ZESTRIL) 20 MG tablet Take 20 mg by mouth daily.      . metFORMIN (GLUCOPHAGE) 1000 MG tablet Take 1,000 mg by mouth 2 (two) times daily with a meal.      . pantoprazole (PROTONIX) 20 MG tablet Take 20 mg by mouth daily.        History   Social History  . Marital Status: Divorced    Spouse Name: N/A    Number of Children: 4  . Years of Education: N/A   Occupational History  . Retired  [employer]    [employer]   Social History Main Topics  . Smoking status: Current Everyday Smoker -- 1.0 packs/day for 50 years    Types: Cigarettes  . Smokeless tobacco: None  . Alcohol Use: No  . Drug Use: No  . Sexually Active: None   Social History Narrative   Lives with grandson 66 years old. She is able to do housework, yard work and run errands without difficulty.    Family History  Problem Relation Age of Onset  . Coronary artery disease Father 86    Unclear if it was  an MI  . Stroke Mother 67  . Coronary artery disease Brother 37  . Coronary artery disease Brother 68   Pt states GERD Sx are well-controlled on home meds. She never has melena, BRBPR or hematemesis. She walks with a cane because of spinal stenosis and balance problems. She has significant M-S pain issues but the spinal stenosis hurts the worst in her left back and hip. She does have other pain issues. No recent fevers/chills. She coughs and wheezes on a regular basis. Cough is non-productive.  As stated in the HPI and negative for all other systems.  Physical Exam: Blood pressure 122/68, pulse 83, temperature 97.5 F (36.4 C), temperature source Oral, resp. rate 18, height 5' (1.524 m), weight 65.7 kg (144 lb 13.5 oz), SpO2 97.00%.  GENERAL:  No acute distress HEENT:  Pupils equal round and reactive, fundi not visualized, oral mucosa unremarkable NECK:  No jugular venous distention, waveform within normal limits, carotid upstroke brisk and symmetric, no bruits, no thyromegaly LYMPHATICS:  No cervical, inguinal  adenopathy LUNGS:  Clear to auscultation bilaterally BACK:  No CVA tenderness CHEST:  CABG scar HEART:  PMI not palpable,S1 and S2 within normal limits, no S3, no S4, no clicks, no rubs, soft SEM at RSB ABD:  Flat, positive bowel sounds normal in frequency in pitch, no bruits, no rebound, no guarding, no midline pulsatile mass, no hepatomegaly, no splenomegaly EXT:  2 plus pulses throughout, no edema, no cyanosis no clubbing SKIN:  No rashes no nodules NEURO:  Cranial nerves II through XII grossly intact, motor decreased right upper and lower strength PSYCH:  Cognitively intact, oriented to person place and time  Labs: Lab Results  Component Value Date   BUN 13 05/18/2011   Lab Results  Component Value Date   CREATININE 1.12* 05/18/2011   Lab Results  Component Value Date   NA 139 05/18/2011   K 3.1* 05/18/2011   CL 93* 05/18/2011   CO2 31 05/18/2011   Lab Results  Component Value Date   CKTOTAL 206* 05/19/2011   CKMB 6.8* 05/19/2011   TROPONINI <0.30 05/19/2011   Lab Results  Component Value Date   WBC 8.6 05/18/2011   HGB 14.4 05/18/2011   HCT 43.2 05/18/2011   MCV 87.6 05/18/2011   PLT 215 05/18/2011   Lab Results  Component Value Date   CHOL 248* 05/19/2011   Lab Results  Component Value Date   ALT 29 05/17/2011   AST 21 05/17/2011   ALKPHOS 100 05/17/2011   BILITOT 0.5 05/17/2011     Radiology:     CXR 1/10 1. Cardiomegaly without pulmonary edema.  2. Improved aeration in the right lung base with persistent mild patchy density.   EKG:  NSR rate 94, abnormal P wave axis, RAD, possible RAE and LAE, no acute ST T wave changes.  Suspect lead reversal.   ASSESSMENT: 1) CVA, acute 2) CAD s/p CABG 2007 3) Atypical CP 4) A/c systolic CHF       --Echo with newly decreased EF 15-20% 5) DM2 6) HTN 7) Hypokalemia 8) Hyperlipidemia  PLAN/DISCUSSION:  I have reviewed her echo and also reviewed previous echo and cardiac history. LV function is severely reduced - which  appears new. CP is quite atypical and CEs are negative. Ideally would proceed with cardiac cath to further evaluate but given recent CVA would like to avoid invasive procedure at this time if possible. Will start with Lexiscan Myoview to further evaluate. (she has had problems  with adenosine in past but we discussed difference between adenosine and Lexiscan). If high risk will need cath.  Volume status currently looks ok. Will add b-block and spiro.   I discussed this with her at length.  Signed: Arvilla Meres 05/19/2011, 12:50 PM

## 2011-05-19 NOTE — Progress Notes (Signed)
Patient ID: [name], female   DOB: 09/11/1945, 66 y.o.   MRN: [id number] Stroke Team Progress Note  HISTORY [name] is a 65y/o woman with history of 2 previous strokes who presented on 1/9 with new onset right face, arm, and leg weakness. She was initially seen at [facility] but refused admission. She presented to [facility] ED when her symptoms worsened but was then out of the tPA window. NIHSS was 6 on admission. She was on aspirin and Plavix at home for antiplatelet therapy. A left carotid bruit was noted on her admission exam concerning for left carotid stenosis. CT head did not reveal evidence of abnormality.  SUBJECTIVE Patient is resting in bed this morning with no complaint. Overall she feels her condition is stable. She continues to complain of right sided weakness and numbness. She says she is sleepy this morning.  OBJECTIVE Most recent Vital Signs: Temp: 97.5 F (36.4 C) (01/12 0600) Temp src: Oral (01/12 0600) BP: 119/81 mmHg (01/12 0600) Pulse Rate: 84  (01/12 0600) Respiratory Rate: 20 O2 Saturation: 98%  CBG (last 3)  Basename 05/19/11 0746 05/19/11 0152  GLUCAP 134* 320*   Intake/Output from previous day:    IV Fluid Intake:     . sodium chloride     Medications    .  stroke: mapping our early stages of recovery book   Does not apply Once  . aspirin  324 mg Oral Once  . aspirin  300 mg Rectal Daily   Or  . aspirin  325 mg Oral Daily  . clopidogrel  75 mg Oral Daily  . DULoxetine  60 mg Oral Daily  . enoxaparin  40 mg Subcutaneous Q24H  . folic acid  1 mg Oral Daily  . furosemide  20 mg Oral BID  . insulin aspart  0-9 Units Subcutaneous TID WC  . insulin aspart  3 Units Subcutaneous Once  . insulin glargine  23 Units Subcutaneous QHS  . lisinopril  20 mg Oral Daily  .  morphine injection  4 mg Intravenous Once  . pantoprazole  20 mg Oral Daily  . potassium chloride  10 mEq Oral BID  . potassium chloride  10 mEq Intravenous Once    . simvastatin  20 mg Oral q1800  PRN albuterol, diphenhydrAMINE, HYDROcodone-acetaminophen, nitroGLYCERIN, ondansetron (ZOFRAN) IV, senna-docusate, DISCONTD: acetaminophen  Diet:  NPO Activity:  Up with assistance. DVT Prophylaxis:  Lovenox  Studies: CBC    Component Value Date/Time   WBC 8.6 05/18/2011 2307   RBC 4.93 05/18/2011 2307   HGB 14.4 05/18/2011 2307   HCT 43.2 05/18/2011 2307   PLT 215 05/18/2011 2307   MCV 87.6 05/18/2011 2307   MCH 29.2 05/18/2011 2307   MCHC 33.3 05/18/2011 2307   RDW 14.9 05/18/2011 2307   LYMPHSABS 2.0 05/17/2011 1358   MONOABS 0.6 05/17/2011 1358   EOSABS 0.2 05/17/2011 1358   BASOSABS 0.0 05/17/2011 1358   CMP    Component Value Date/Time   NA 139 05/18/2011 1404   K 3.1* 05/18/2011 1404   CL 93* 05/18/2011 1404   CO2 31 05/18/2011 1404   GLUCOSE 306* 05/18/2011 1404   BUN 13 05/18/2011 1404   CREATININE 1.12* 05/18/2011 2307   CALCIUM 9.6 05/18/2011 1404   PROT 7.5 05/17/2011 1358   ALBUMIN 3.2* 05/17/2011 1358   AST 21 05/17/2011 1358   ALT 29 05/17/2011 1358   ALKPHOS 100 05/17/2011 1358   BILITOT 0.5 05/17/2011 1358  GFRNONAA 50* 05/18/2011 2307   GFRAA 58* 05/18/2011 2307   COAGS Lab Results  Component Value Date   INR 1.07 05/17/2011   INR 2.04* 02/04/2010   INR 2.14* 02/03/2010   Lipid Panel    Component Value Date/Time   CHOL 248* 05/19/2011 0530   TRIG 153* 05/19/2011 0530   HDL 34* 05/19/2011 0530   CHOLHDL 7.3 05/19/2011 0530   VLDL 31 05/19/2011 0530   LDLCALC 183* 05/19/2011 0530   HgbA1C  Lab Results  Component Value Date   HGBA1C  Value: 7.1 (NOTE)                                                                       According to the ADA Clinical Practice Recommendations for 2011, when HbA1c is used as a screening test:   >=6.5%   Diagnostic of Diabetes Mellitus           (if abnormal result  is confirmed)  5.7-6.4%   Increased risk of developing Diabetes Mellitus  References:Diagnosis and Classification of Diabetes Mellitus,Diabetes  Care,2011,34(Suppl 1):S62-S69 and Standards of Medical Care in         Diabetes - 2011,Diabetes Care,2011,34  (Suppl 1):S11-S61.* 01/30/2010   Urine Drug Screen  No results found for this basename: labopia, cocainscrnur, labbenz, amphetmu, thcu, labbarb    Alcohol Level No results found for this basename: eth     Results for orders placed during the hospital encounter of 05/18/11 (from the past 24 hour(s))  CARDIAC PANEL(CRET KIN+CKTOT+MB+TROPI)     Status: Abnormal   Collection Time   05/18/11  2:04 PM      Component Value Range   Total CK 254 (*) 7 - 177 (U/L)   CK, MB 8.1 (*) 0.3 - 4.0 (ng/mL)   Troponin I <0.30  <0.30 (ng/mL)   Relative Index 3.2 (*) 0.0 - 2.5   BASIC METABOLIC PANEL     Status: Abnormal   Collection Time   05/18/11  2:04 PM      Component Value Range   Sodium 139  135 - 145 (mEq/L)   Potassium 3.1 (*) 3.5 - 5.1 (mEq/L)   Chloride 93 (*) 96 - 112 (mEq/L)   CO2 31  19 - 32 (mEq/L)   Glucose, Bld 306 (*) 70 - 99 (mg/dL)   BUN 13  6 - 23 (mg/dL)   Creatinine, Ser 4.78  0.50 - 1.10 (mg/dL)   Calcium 9.6  8.4 - 29.5 (mg/dL)   GFR calc non Af Amer 55 (*) >90 (mL/min)   GFR calc Af Amer 64 (*) >90 (mL/min)  CBC     Status: Abnormal   Collection Time   05/18/11  2:04 PM      Component Value Range   WBC 8.9  4.0 - 10.5 (K/uL)   RBC 5.09  3.87 - 5.11 (MIL/uL)   Hemoglobin 15.1 (*) 12.0 - 15.0 (g/dL)   HCT 62.1  30.8 - 65.7 (%)   MCV 86.6  78.0 - 100.0 (fL)   MCH 29.7  26.0 - 34.0 (pg)   MCHC 34.2  30.0 - 36.0 (g/dL)   RDW 84.6  96.2 - 95.2 (%)   Platelets 217  150 - 400 (K/uL)  CARDIAC PANEL(CRET KIN+CKTOT+MB+TROPI)  Status: Abnormal   Collection Time   05/18/11 11:01 PM      Component Value Range   Total CK 236 (*) 7 - 177 (U/L)   CK, MB 7.5 (*) 0.3 - 4.0 (ng/mL)   Troponin I <0.30  <0.30 (ng/mL)   Relative Index 3.2 (*) 0.0 - 2.5   CBC     Status: Normal   Collection Time   05/18/11 11:07 PM      Component Value Range   WBC 8.6  4.0 - 10.5 (K/uL)    RBC 4.93  3.87 - 5.11 (MIL/uL)   Hemoglobin 14.4  12.0 - 15.0 (g/dL)   HCT 04.5  40.9 - 81.1 (%)   MCV 87.6  78.0 - 100.0 (fL)   MCH 29.2  26.0 - 34.0 (pg)   MCHC 33.3  30.0 - 36.0 (g/dL)   RDW 91.4  78.2 - 95.6 (%)   Platelets 215  150 - 400 (K/uL)  CREATININE, SERUM     Status: Abnormal   Collection Time   05/18/11 11:07 PM      Component Value Range   Creatinine, Ser 1.12 (*) 0.50 - 1.10 (mg/dL)   GFR calc non Af Amer 50 (*) >90 (mL/min)   GFR calc Af Amer 58 (*) >90 (mL/min)  GLUCOSE, CAPILLARY     Status: Abnormal   Collection Time   05/19/11  1:52 AM      Component Value Range   Glucose-Capillary 320 (*) 70 - 99 (mg/dL)  LIPID PANEL     Status: Abnormal   Collection Time   05/19/11  5:30 AM      Component Value Range   Cholesterol 248 (*) 0 - 200 (mg/dL)   Triglycerides 213 (*) <150 (mg/dL)   HDL 34 (*) >08 (mg/dL)   Total CHOL/HDL Ratio 7.3     VLDL 31  0 - 40 (mg/dL)   LDL Cholesterol 657 (*) 0 - 99 (mg/dL)  CARDIAC PANEL(CRET KIN+CKTOT+MB+TROPI)     Status: Abnormal   Collection Time   05/19/11  7:44 AM      Component Value Range   Total CK 206 (*) 7 - 177 (U/L)   CK, MB 6.8 (*) 0.3 - 4.0 (ng/mL)   Troponin I <0.30  <0.30 (ng/mL)   Relative Index 3.3 (*) 0.0 - 2.5   GLUCOSE, CAPILLARY     Status: Abnormal   Collection Time   05/19/11  7:46 AM      Component Value Range   Glucose-Capillary 134 (*) 70 - 99 (mg/dL)     CT of the brain   05/17/2011  *RADIOLOGY REPORT*  Clinical Data: Headache.  Dizziness.  Unable to move right hand. Previous stroke.  CT HEAD WITHOUT CONTRAST  Technique:  Contiguous axial images were obtained from the base of the skull through the vertex without contrast.  Comparison: 03/13/ 2006  Findings: There is no evidence of intracranial hemorrhage, brain edema or other signs of acute infarction.  There is no evidence of intracranial mass lesion or mass effect.  No abnormal extra-axial fluid collections are identified.  Ventricles normal in size.   Mild chronic small vessel disease is noted.  No other intracranial abnormality identified.  No skull fracture or other bone abnormality.  IMPRESSION:  1.  No acute intracranial abnormality. 2.  Mild chronic small vessel disease.  Original Report Authenticated By: Danae Orleans, M.D.  CXR   05/18/2011  *RADIOLOGY REPORT*  Clinical Data: Left chest pain.  Shortness of breath  for several weeks.  General weakness for 6-8 weeks.  History of bypass surgery, smoking.  History of stroke on 05/16/2011.  CHEST - 2 VIEW  Comparison: 05/17/2011  Findings: Patient has had median sternotomy and CABG.  The heart is enlarged.  There is minimal residual density at the right lung base but there has been improvement in aeration in the right lower lobe. No pleural effusions or new infiltrates.  Degenerative changes are seen in the spine.  IMPRESSION:  1.  Cardiomegaly without pulmonary edema. 2.  Improved aeration in the right lung base with persistent mild patchy density.  Original Report Authenticated By: Patterson Hammersmith, M.D.   EKG  normal EKG, normal sinus rhythm, unchanged from previous tracings.   Physical Exam   GENERAL:   Well nourished, well hydrated, no acute distress.   CARDIOVASCULAR:   Regular rate and rhythm, no thrills or palpable murmurs, S1, S2, no murmur, no rubs or gallops.      Carotid arteries: Left carotid bruit. No bruit on right.   MENTAL STATUS EXAM:    Orientation:  Alert and oriented to person, place and time.      Memory:  Cooperative, follows commands well.  Recent and remote memory normal.      Attention, concentration:  Attention span and concentration are normal.      Language:  Speech is clear and language is normal.      Fund of knowledge:  Aware of current events, vocabulary appropriate for patient age.   CRANIAL NERVES:     CN 2 (Optic):  Visual fields intact to confrontation, funduscopic examination without optic disk pallor or edema, retinal vessels are normal.      CN 3,4,6  (EOM):  Pupils equal and reactive to light and near full eye movement without nystagmus.      CN 5 (Trigeminal):  Decreased sensation in right face, most significant in V3 distribution. No weakness of masticatory muscles.     CN 7 (Facial):  No evidence of right facial weakness. May have mild flattening on the left which may be due to old stroke.     CN 8 (Auditory):  Auditory acuity grossly normal.      CN 9,10 (Glossophar):  The uvula is midline, the palate elevates symmetrically.      CN 11 (spinal access):  Normal sternocleidomastoid and trapezius strength.      CN 12 (Hypoglossal):  The tongue is midline. No atrophy or fasciculations.   MOTOR:    4+/5 in the right upper and lower extremities. Some motor apraxia.  Muscle Tone: Tone and muscle bulk are normal in the upper and lower extremities.  REFLEXES:     Triceps:                 (R): 2+  (L): 2+      Biceps:                  (R): 2+  (L): 2+      Brachioradialis:     (R): 2+  (L): 2+      Patellar:                 (R): 2+  (L): 2+      Achilles:                 (R): 2+  (L): 2+      Babinski:    (R): present  (L): absent   COORDINATION:  Intact FNF and HTS on left. Some difficulty with commands on right but otherwise intact.   SENSATION:     Decreased to light touch on right compared to left. No neglect noted.   ASSESSMENT [name] is a 66 y.o. 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 On aspirin 81 mg orally every day and clopidogrel 75 mg orally every day for secondary stroke prevention.  Stroke risk factors:  diabetes mellitus, hyperlipidemia, hypertension and smoking  Hospital day # 1  TREATMENT/PLAN -Continue aspirin 325mg  daily and Plavix 75mg  daily for secondary stroke prevention.  -MRI brain and MRA head/neck today. -TTE -Continue telemetry monitoring for arrhythmia. -TCD/CUS -Agree with starting statin for hyperlipidemia. Continue  Zocor. -HgbA1c on this admission. -PT/OT/ST evals for discharge planning. -Agree with tobacco cessation consult.  Kipp Laurence, MD [facility] Pager: [phone] 05/19/2011 9:35 AM

## 2011-05-19 NOTE — Progress Notes (Signed)
PT Cancellation Note  Treatment cancelled at this time due to patient receiving procedure or test (pt leaving for MRI).    Will attempt to see later today as pt available/census allows.  [name] 05/19/2011, 11:30 AM Pager [phone]

## 2011-05-19 NOTE — Progress Notes (Signed)
PROGRESS NOTE  MESA JANUS ZOX:[id number] DOB: [birth year] DOA: 05/18/2011 PCP: Ancil Boozer, MD, MD Cardiologist: Kristeen Miss, MD  Brief narrative: 66 year old woman presented initially the day prior to admission with chest pain and strokelike symptoms. She left AMA from the emergency department. She presented yesterday into the emergency department with a history of right, face, arm and leg weakness.. Past swallow screen.  Past medical history: Stroke x2, coronary artery disease, CABG, diabetes mellitus  Consultants:  Neurology  Cardiology  Procedures:  None  Antibiotics:  None  Interim History: Interval documentation reviewed. Subjective: No further chest pain. Perhaps some improvement in her right arm strength. No new issues.  Objective: Filed Vitals:   05/19/11 0001 05/19/11 0200 05/19/11 0400 05/19/11 0600  BP: 128/62 136/74 143/74 119/81  Pulse: 78 80 80 84  Temp: 98.6 F (37 C) 97.3 F (36.3 C)  97.5 F (36.4 C)  TempSrc: Oral Oral Oral Oral  Resp: 20 20 20 20   Height:      Weight:      SpO2: 94% 96% 99% 98%   No intake or output data in the 24 hours ending 05/19/11 0953  Exam:  General: Appears calm and comfortable. Lying in bed. Cardiovascular: Regular rate and rhythm. No murmur, rub or gallop. Normal respiratory effort. Telemetry: Sinus rhythm with PVCs. Respiratory: Clear to auscultation bilaterally. No wheezes, rales or rhonchi. Normal respiratory effort. Musculoskeletal: Decreased strength right upper and right lower extremity compared to left upper and lower extremity.  Data Reviewed: Basic Metabolic Panel:  Lab 05/18/11 8119 05/18/11 1404 05/17/11 1358  NA -- 139 141  K -- 3.1* 3.4*  CL -- 93* 100  CO2 -- 31 30  GLUCOSE -- 306* 171*  BUN -- 13 11  CREATININE 1.12* 1.04 0.90  CALCIUM -- 9.6 9.5  MG -- -- --  PHOS -- -- --   Liver Function Tests:  Lab 05/17/11 1358  AST 21  ALT 29  ALKPHOS 100  BILITOT 0.5  PROT 7.5    ALBUMIN 3.2*   CBC:  Lab 05/18/11 2307 05/18/11 1404 05/17/11 1358  WBC 8.6 8.9 8.0  NEUTROABS -- -- 5.1  HGB 14.4 15.1* 14.4  HCT 43.2 44.1 43.3  MCV 87.6 86.6 86.3  PLT 215 217 194   Cardiac Enzymes:  Lab 05/19/11 0744 05/18/11 2301 05/18/11 1404 05/17/11 1358  CKTOTAL 206* 236* 254* 356*  CKMB 6.8* 7.5* 8.1* 11.3*  CKMBINDEX -- -- -- --  TROPONINI <0.30 <0.30 <0.30 <0.30   CBG:  Lab 05/19/11 0746 05/19/11 0152  GLUCAP 134* 320*    Studies: Dg Chest 2 View  05/18/2011  *RADIOLOGY REPORT*  Clinical Data: Left chest pain.  Shortness of breath for several weeks.  General weakness for 6-8 weeks.  History of bypass surgery, smoking.  History of stroke on 05/16/2011.  CHEST - 2 VIEW  Comparison: 05/17/2011  Findings: Patient has had median sternotomy and CABG.  The heart is enlarged.  There is minimal residual density at the right lung base but there has been improvement in aeration in the right lower lobe. No pleural effusions or new infiltrates.  Degenerative changes are seen in the spine.  IMPRESSION:  1.  Cardiomegaly without pulmonary edema. 2.  Improved aeration in the right lung base with persistent mild patchy density.  Original Report Authenticated By: Patterson Hammersmith, M.D.   Ct Head Wo Contrast  05/17/2011  *RADIOLOGY REPORT*  Clinical Data: Headache.  Dizziness.  Unable to move right hand. Previous stroke.  CT HEAD WITHOUT CONTRAST  Technique:  Contiguous axial images were obtained from the base of the skull through the vertex without contrast.  Comparison: 03/13/ 2006  Findings: There is no evidence of intracranial hemorrhage, brain edema or other signs of acute infarction.  There is no evidence of intracranial mass lesion or mass effect.  No abnormal extra-axial fluid collections are identified.  Ventricles normal in size.  Mild chronic small vessel disease is noted.  No other intracranial abnormality identified.  No skull fracture or other bone abnormality.  IMPRESSION:   1.  No acute intracranial abnormality. 2.  Mild chronic small vessel disease.  Original Report Authenticated By: Danae Orleans, M.D.    Scheduled Meds:   .  stroke: mapping our early stages of recovery book   Does not apply Once  . aspirin  324 mg Oral Once  . aspirin  300 mg Rectal Daily   Or  . aspirin  325 mg Oral Daily  . clopidogrel  75 mg Oral Daily  . DULoxetine  60 mg Oral Daily  . enoxaparin  40 mg Subcutaneous Q24H  . folic acid  1 mg Oral Daily  . furosemide  20 mg Oral BID  . insulin aspart  0-9 Units Subcutaneous TID WC  . insulin aspart  3 Units Subcutaneous Once  . insulin glargine  23 Units Subcutaneous QHS  . lisinopril  20 mg Oral Daily  .  morphine injection  4 mg Intravenous Once  . pantoprazole  20 mg Oral Daily  . potassium chloride  10 mEq Oral BID  . potassium chloride  10 mEq Intravenous Once  . simvastatin  20 mg Oral q1800   Continuous Infusions:   . sodium chloride       Assessment/Plan: 1. Chest pain/elevated CK-MB/total CK: Atypical. Further recommendations per cardiology. Not clear why she is not on a beta blocker.  2. Recurrent right acute ischemic cerebral infarction: Stroke evaluation. Further recommendations per neurology. Aspirin, Plavix. Simvastatin. 3. Left carotid bruit: Followup carotid ultrasound. 4. Hyperlipidemia/hypertriglyceridemia: Zocor. 5. Possible congestive heart failure: Lasix, lisinopril. Followup 2-D echocardiogram. 6. Diabetes mellitus: Stable. Continue Lantus and sliding scale insulin. 7. Cigarette smoker: Recommend cessation  Code Status: Full code.  Disposition Plan: Pending further evaluation and treatment.   Brendia Sacks, MD  [facility] Pager [phone] 05/19/2011, 9:53 AM    LOS: 1 day

## 2011-05-19 NOTE — Progress Notes (Signed)
  Echocardiogram 2D Echocardiogram has been performed.  Jamee Pacholski F 05/19/2011, 10:05 AM

## 2011-05-19 NOTE — Progress Notes (Signed)
OT Cancellation Note  (late entry for 1145)   Treatment cancelled today due to patient going for MRI. Will re-attempt as time allows.   Leif Loflin, OTR/L Pager [phone] 05/19/2011, 12:26 PM

## 2011-05-19 NOTE — Progress Notes (Signed)
Physical Therapy Evaluation Patient Details Name: [name] MRN: [id number] DOB: [birth year] Today's Date: 05/19/2011  Problem List:  Patient Active Problem List  Diagnoses  . CVA (cerebral infarction)  . Chest pain  . CAD (coronary artery disease)  . HTN (hypertension)  . Dyslipidemia  . Tobacco abuse  . Acute on chronic systolic heart failure    Past Medical History:  Past Medical History  Diagnosis Date  . Stroke   . Coronary artery disease   . Ear infection     right  . Diabetes mellitus   . Hypertension   . 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 Asthma   . Bronchitis   . Reflux    Past Surgical History:  Past Surgical History  Procedure Date  . Coronary artery bypass graft     LIMA to RI, SVG to distal RCA, SVG to LAD, SVG to OM. 2007  . Joint replacement     Right hip replacement   . Debridement skin / sq / muscle of trunk     s/p staph infection from CABG 2007  . Cystectomy     Subcutaneous    PT Assessment/Plan/Recommendation PT Assessment Clinical Impression Statement: Pt s/p ?Lt CVA with resultant Rt sided weakness (UE>LE).  Pt highly motivated to regain independence and will benefit from PT. Noted pt with recent decline in EF to 10-15 % which may impede exercise tolerance. PT Recommendation/Assessment: Patient will need skilled PT in the acute care venue PT Problem List: Decreased strength;Decreased activity tolerance;Decreased balance;Decreased mobility;Decreased knowledge of use of DME;Cardiopulmonary status limiting activity;Impaired sensation Barriers to Discharge: Decreased caregiver support PT Therapy Diagnosis : Difficulty walking;Hemiplegia dominant side PT Plan PT Frequency: Min 4X/week PT Treatment/Interventions: DME instruction;Gait training;Stair training;Functional mobility training;Therapeutic activities;Therapeutic exercise;Balance training;Neuromuscular re-education;Patient/family education PT Recommendation Follow Up  Recommendations: Home health PT Equipment Recommended: None recommended by PT PT Goals  Acute Rehab PT Goals PT Goal Formulation: With patient Time For Goal Achievement: 2 weeks Pt will go Supine/Side to Sit: with modified independence;with HOB 0 degrees PT Goal: Supine/Side to Sit - Progress: Not met Pt will go Sit to Supine/Side: with modified independence;with HOB 0 degrees PT Goal: Sit to Supine/Side - Progress: Not met Pt will go Sit to Stand: with modified independence;with upper extremity assist PT Goal: Sit to Stand - Progress: Not met Pt will go Stand to Sit: with modified independence;without upper extremity assist PT Goal: Stand to Sit - Progress: Not met Pt will Ambulate: 51 - 150 feet;with modified independence;with least restrictive assistive device PT Goal: Ambulate - Progress: Not met Pt will Go Up / Down Stairs: 3-5 stairs;with min assist;with least restrictive assistive device;with rail(s) PT Goal: Up/Down Stairs - Progress: Not met  PT Evaluation Precautions/Restrictions  Precautions Precautions: Fall Prior Functioning  Home Living Lives With: Other (Comment) (grandson lives with her; gone to work days) Actor Help From: Family Type of Home: House Home Layout: One level Home Access: Stairs to enter Entrance Stairs-Rails: Right Entrance Stairs-Number of Steps: 3 Home Adaptive Equipment: Straight cane;Walker - rolling Additional Comments: uses cane outside, no device indoors Prior Function Level of Independence: Independent with basic ADLs;Independent with gait;Requires assistive device for independence Driving: Yes Cognition Cognition Arousal/Alertness: Awake/alert Overall Cognitive Status: Appears within functional limits for tasks assessed Orientation Level: Oriented X4 Sensation/Coordination Sensation Light Touch: Impaired Detail Light Touch Impaired Details: Impaired RLE;Impaired LLE (diminished/neuropathy bil feet) Extremity Assessment RLE  Assessment RLE Assessment: Exceptions to [facility] RLE AROM (degrees) Overall  AROM Right Lower Extremity: Due to decreased strength RLE Overall AROM Comments: hip flexion, knee extension, dorsiflexion 3+/5 LLE Assessment LLE Assessment: Within Functional Limits Mobility (including Balance) Bed Mobility Bed Mobility: Yes Rolling Left: 6: Modified independent (Device/Increase time);With rail Left Sidelying to Sit: Other (comment) (minguard) Transfers Transfers: Yes Sit to Stand: 4: Min assist;With upper extremity assist;From bed Stand to Sit: 4: Min assist;With upper extremity assist;To toilet Ambulation/Gait Ambulation/Gait: Yes Ambulation/Gait Assistance: 4: Min assist Ambulation/Gait Assistance Details (indicate cue type and reason): flexed posture; slow velocity Ambulation Distance (Feet): 100 Feet Assistive device: Straight cane Gait Pattern: Decreased stride length;Right foot flat;Left foot flat;Right flexed knee in stance Stairs: No    Exercise    End of Session PT - End of Session Equipment Utilized During Treatment: Gait belt Activity Tolerance: Patient tolerated treatment well Patient left: in bed;with call bell in reach (phlebotomist with pt to draw blood) Nurse Communication: Mobility status for transfers;Mobility status for ambulation General Behavior During Session: [facility] for tasks performed  [name] 05/19/2011, 3:20 PM Pager [phone]

## 2011-05-20 ENCOUNTER — Inpatient Hospital Stay (HOSPITAL_COMMUNITY): Payer: Medicare Other

## 2011-05-20 DIAGNOSIS — R079 Chest pain, unspecified: Secondary | ICD-10-CM

## 2011-05-20 LAB — BASIC METABOLIC PANEL
BUN: 21 mg/dL (ref 6–23)
Creatinine, Ser: 1.08 mg/dL (ref 0.50–1.10)
GFR calc Af Amer: 61 mL/min — ABNORMAL LOW (ref >90)
GFR calc non Af Amer: 53 mL/min — ABNORMAL LOW (ref >90)
Potassium: 3.3 mEq/L — ABNORMAL LOW (ref 3.5–5.1)

## 2011-05-20 LAB — GLUCOSE, CAPILLARY: Glucose-Capillary: 167 mg/dL — ABNORMAL HIGH (ref 70–99)

## 2011-05-20 MED ORDER — TECHNETIUM TC 99M TETROFOSMIN IV KIT
10.0000 | PACK | Freq: Once | INTRAVENOUS | Status: AC | PRN
  Administered 2011-05-20: 10 via INTRAVENOUS

## 2011-05-20 MED ORDER — FENTANYL CITRATE 0.05 MG/ML IJ SOLN
INTRAMUSCULAR | Status: AC
  Filled 2011-05-20: qty 4

## 2011-05-20 MED ORDER — ONDANSETRON HCL 4 MG/2ML IJ SOLN
INTRAMUSCULAR | Status: AC
  Administered 2011-05-20: 4 mg via INTRAVENOUS
  Filled 2011-05-20: qty 2

## 2011-05-20 MED ORDER — ALPRAZOLAM 0.25 MG PO TABS
ORAL_TABLET | ORAL | Status: AC
  Administered 2011-05-20: 0.5 mg via ORAL
  Filled 2011-05-20: qty 2

## 2011-05-20 MED ORDER — ALPRAZOLAM 0.5 MG PO TABS
0.5000 mg | ORAL_TABLET | Freq: Two times a day (BID) | ORAL | Status: DC | PRN
  Administered 2011-05-20: 0.5 mg via ORAL

## 2011-05-20 MED ORDER — MIDAZOLAM HCL 2 MG/2ML IJ SOLN
INTRAMUSCULAR | Status: AC
  Filled 2011-05-20: qty 4

## 2011-05-20 MED ORDER — ALPRAZOLAM ER 0.5 MG PO TB24: 0.5000 mg | ORAL_TABLET | Freq: Two times a day (BID) | ORAL | Status: DC | PRN

## 2011-05-20 MED ORDER — TECHNETIUM TC 99M TETROFOSMIN IV KIT
30.0000 | PACK | Freq: Once | INTRAVENOUS | Status: AC | PRN
  Administered 2011-05-20: 30 via INTRAVENOUS

## 2011-05-20 NOTE — Progress Notes (Signed)
Stroke Team Progress Note   HISTORY  Ms. [name] is a 65y/o woman with history of 2 previous strokes who presented on 1/9 with new onset right face, arm, and leg weakness. She was initially seen at [facility] but refused admission. She presented to [facility] ED when her symptoms worsened but was then out of the tPA window. NIHSS was 6 on admission. She was on aspirin and Plavix at home for antiplatelet therapy. A left carotid bruit was noted on her admission exam concerning for left carotid stenosis. CT head did not reveal evidence of abnormality.  Subjective: Feeling ok.  Continues to complain of right sided weakness and numbness.  No chest pain this am.  Objective: BP 113/75  Pulse 79  Temp(Src) 97.8 F (36.6 C) (Oral)  Resp 16  Ht 5' (1.524 m)  Wt 65.5 kg (144 lb 6.4 oz)  BMI 28.20 kg/m2  SpO2 94%  Diet: NPO  Activity: Up with assistance.  DVT Prophylaxis: Lovenox  Medications:  Scheduled:   . aspirin  300 mg Rectal Daily   Or  . aspirin  325 mg Oral Daily  . carvedilol  3.125 mg Oral BID WC  . clopidogrel  75 mg Oral Daily  . DULoxetine  60 mg Oral Daily  . enoxaparin  40 mg Subcutaneous Q24H  . folic acid  1 mg Oral Daily  . furosemide  20 mg Oral BID  . insulin aspart  0-9 Units Subcutaneous TID WC  . insulin glargine  23 Units Subcutaneous QHS  . lisinopril  20 mg Oral Daily  . pantoprazole  20 mg Oral Daily  . potassium chloride  10 mEq Oral BID  . simvastatin  20 mg Oral q1800  . spironolactone  12.5 mg Oral Daily    Neurologic Exam: GENERAL: Well nourished, well hydrated, no acute distress.  CARDIOVASCULAR: Regular rate and rhythm, no thrills or palpable murmurs, S1, S2, no murmur, no rubs or gallops.  Carotid arteries: Left carotid bruit. No bruit on right.  MENTAL STATUS EXAM:  Orientation: Alert and oriented to person, place and time.  Memory: Cooperative, follows commands well. Recent and remote memory normal.  Attention, concentration:  Attention span and concentration are normal.  Language: Speech is clear and language is normal.  Fund of knowledge: Aware of current events, vocabulary appropriate for patient age.  CRANIAL NERVES:  CN 2 (Optic): Visual fields intact to confrontation, funduscopic examination without optic disk pallor or edema, retinal  vessels are normal.  CN 3,4,6 (EOM): Pupils equal and reactive to light and near full eye movement without nystagmus.  CN 5 (Trigeminal): Decreased sensation in right face, most significant in V3 distribution. No weakness of   masticatory muscles.  CN 7 (Facial): No evidence of right facial weakness. May have mild flattening on the left which may be due to old  stroke.  CN 8 (Auditory): Auditory acuity grossly normal.  CN 9,10 (Glossophar): The uvula is midline, the palate elevates symmetrically.  CN 11 (spinal access): Normal sternocleidomastoid and trapezius strength.  CN 12 (Hypoglossal): The tongue is midline. No atrophy or fasciculations.  MOTOR: 4+/5 in the right upper and lower extremities. Some motor apraxia.   Muscle Tone: Tone and muscle bulk are normal in the upper and lower extremities.  REFLEXES:    Triceps: (R): 2+ (L): 2+    Biceps: (R): 2+ (L): 2+    Brachioradialis: (R): 2+ (L): 2+    Patellar: (R): 2+ (L): 2+    Achilles: (  R): 2+ (L): 2+    Babinski: (R): present (L): absent  COORDINATION: Intact FNF and HTS on left. Some difficulty with commands on right but otherwise intact.  SENSATION: Decreased to light touch on right compared to left. No neglect noted.   Lab Results: HEMOGLOBIN A1C     Status: Abnormal   05/19/11  5:30 AM   Hemoglobin A1C 8.9 (*) <5.7 (%)   LIPID PANEL     Status: Abnormal   05/19/11  5:30 AM   Cholesterol 248 (*) 0 - 200 (mg/dL)    Triglycerides 161 (*) <150 (mg/dL)    HDL 34 (*) >09 (mg/dL)    Total CHOL/HDL Ratio 7.3      VLDL 31  0 - 40 (mg/dL)    LDL Cholesterol 604 (*) 0 - 99 (mg/dL)   CARDIAC PANEL(CRET KIN+CKTOT+MB+TROPI)      Status: Abnormal   05/19/11  7:44 AM   Total CK 206 (*) 7 - 177 (U/L)    CK, MB 6.8 (*) 0.3 - 4.0 (ng/mL) .   Troponin I <0.30  <0.30 (ng/mL)    Relative Index 3.3 (*) 0.0 - 2.5    BASIC METABOLIC PANEL     Status: Abnormal   05/19/11  3:05 PM   Sodium 137  135 - 145 (mEq/L)    Potassium 3.5  3.5 - 5.1 (mEq/L)    Chloride 93 (*) 96 - 112 (mEq/L)    CO2 33 (*) 19 - 32 (mEq/L)    Glucose, Bld 294 (*) 70 - 99 (mg/dL)    BUN 16  6 - 23 (mg/dL)    Creatinine, Ser 5.40 (*) 0.50 - 1.10 (mg/dL)    Calcium 9.3  8.4 - 10.5 (mg/dL)    GFR calc non Af Amer 46 (*) >90 (mL/min)    GFR calc Af Amer 53 (*) >90 (mL/min)   GLUCOSE, CAPILLARY     Status: Abnormal   05/19/11  8:39 PM   Glucose-Capillary 270 (*) 70 - 99 (mg/dL)   GLUCOSE, CAPILLARY     Status: Abnormal   05/20/11  7:34 AM   Glucose-Capillary 167 (*) 70 - 99 (mg/dL)    Studies/Results: 9/81/1914 CHEST - 2 VIEW  Comparison: 05/17/2011  Findings: Patient has had median sternotomy and CABG.  The heart is enlarged.  There is minimal residual density at the right lung base but there has been improvement in aeration in the right lower lobe. No pleural effusions or new infiltrates.  Degenerative changes are seen in the spine.  IMPRESSION:  1.  Cardiomegaly without pulmonary edema. 2.  Improved aeration in the right lung base with persistent mild patchy density.  Original Report Authenticated By: Patterson Hammersmith, M.D.   05/19/11  Echo: Left ventricle: LVEF is approximately25% with diffuse hypokinesis, worse in the inferior, inferoseptal   and posterior walls. The cavity size was normal. Wall thickness was normal. Doppler parameters   are consistent with abnormal left ventricular relaxation (grade 1 diastolicdysfunction) Aortic valve: AV   is thickened, moderately calcified with very mild restricted motion.  Assessment: [name] is a 66 y.o. female with  1.  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  2. Chest Pain- resolved.  Cardiac enzymes neg.  3. Diminished LVEF; new- cards w/u underway.  4. Hyperlipidemia. Continue Zocor.   Stroke risk factors: diabetes mellitus, hyperlipidemia, hypertension and smoking    TREATMENT/PLAN  1. Continue aspirin 325mg  daily and  Plavix 75mg  daily for secondary stroke prevention.  2. Stroke work up on hold pending cardiac w/u; [place] today with results pending.    -MRI brain and MRA head/neck ordered.    -Continue telemetry monitoring for arrhythmia.    -TCD/CUS     -PT/OT/ST evals for discharge planning.  3. Agree with tobacco cessation consult.   Pt seen and exam performed by Dr. Lendell Caprice.  LOS: 2 days   Marya Fossa PA-C [facility] [phone] 05/20/2011  10:55 AM

## 2011-05-20 NOTE — Consult Note (Signed)
[name] MRN: [id number] DOB/AGE: [birth year] 66 y.o.  Admit date: 05/18/2011 Primary Physician  Dr. Sandi Mealy Primary Cardiologist   [name] Chief Complaint  Dyspnea, right sided weakness  HPI:   66 y/o woman with HTN, DM2 and CAD s/p CABG in 2007. Echo that time EF 50%. Admitted 1/11 with CVA.  Cardiology consulted due to atypical CP which lasts a second or two. As well as mild CHF.  Echo done yesterday EF 20-25% with global LV dysfunction.   Cath deferred due to recent CVA. Going for [employer] today. No CP. C/o mild SOB. + non-productive cough. Feels weak.   Past Medical History  Diagnosis Date  . Stroke   . Coronary artery disease   . Ear infection     right  . Diabetes mellitus   . Hypertension   . 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 Asthma   . Bronchitis   . Reflux     Past Surgical History  Procedure Date  . Coronary artery bypass graft     LIMA to RI, SVG to distal RCA, SVG to LAD, SVG to OM. 2007  . Joint replacement     Right hip replacement   . Debridement skin / sq / muscle of trunk     s/p staph infection from CABG 2007  . Cystectomy     Subcutaneous    Allergies  Allergen Reactions  . Codeine   . Penicillins   . Sulfa Antibiotics     Medications Prior to Admission  Medication Sig Dispense Refill  . clopidogrel (PLAVIX) 75 MG tablet Take 75 mg by mouth daily.      . folic acid (FOLVITE) 1 MG tablet Take 1 mg by mouth daily.      . furosemide (LASIX) 20 MG tablet Take 20 mg by mouth 2 (two) times daily.      Marland Kitchen HYDROcodone-acetaminophen (NORCO) 5-325 MG per tablet Take 1 tablet by mouth every 6 (six) hours as needed.      . insulin glargine (LANTUS) 100 UNIT/ML injection Inject 23 Units into the skin at bedtime.       . insulin lispro (HUMALOG) 100 UNIT/ML injection Inject 6-10 Units into the skin 3 (three) times daily before meals. Sliding scale      . lisinopril (PRINIVIL,ZESTRIL) 20 MG tablet Take 20 mg by mouth daily.        . metFORMIN (GLUCOPHAGE) 1000 MG tablet Take 1,000 mg by mouth 2 (two) times daily with a meal.      . pantoprazole (PROTONIX) 20 MG tablet Take 20 mg by mouth daily.        History   Social History  . Marital Status: Divorced    Spouse Name: N/A    Number of Children: 4  . Years of Education: N/A   Occupational History  . Retired  [employer]    [employer]   Social History Main Topics  . Smoking status: Current Everyday Smoker -- 1.0 packs/day for 50 years    Types: Cigarettes  . Smokeless tobacco: None  . Alcohol Use: No  . Drug Use: No  . Sexually Active: None   Social History Narrative   Lives with grandson 83 years old. She is able to do housework, yard work and run errands without difficulty.    Family History  Problem Relation Age of Onset  . Coronary artery disease Father 22    Unclear if it was an MI  .  Stroke Mother 52  . Coronary artery disease Brother 28  . Coronary artery disease Brother 68   Pt states GERD Sx are well-controlled on home meds. She never has melena, BRBPR or hematemesis. She walks with a cane because of spinal stenosis and balance problems. She has significant M-S pain issues but the spinal stenosis hurts the worst in her left back and hip. She does have other pain issues. No recent fevers/chills. She coughs and wheezes on a regular basis. Cough is non-productive.  As stated in the HPI and negative for all other systems.  Physical Exam: Blood pressure 124/79, pulse 82, temperature 97.8 F (36.6 C), temperature source Oral, resp. rate 16, height 5' (1.524 m), weight 65.5 kg (144 lb 6.4 oz), SpO2 94.00%.  GENERAL:  No acute distress. Fatigued appearing. +dry cough HEENT:  Normal NECK:  JVP flat, no bruits, no thyromegaly LUNGS:  Clear to auscultation bilaterally CHEST:  CABG scar HEART:  PMI not palpable,S1 and S2 within normal limits, no S3, no S4, no clicks, no rubs, soft SEM at RSB ABD:  Soft. NT/ND. Good BS EXT:  2 plus pulses throughout,  no edema, no cyanosis no clubbin NEURO:  Cranial nerves II through XII grossly intact, motor decreased right upper and lower strength PSYCH:  Cognitively intact, oriented to person place and time. Affect flattened a bit  Labs: Lab Results  Component Value Date   BUN 16 05/19/2011   Lab Results  Component Value Date   CREATININE 1.21* 05/19/2011   Lab Results  Component Value Date   NA 137 05/19/2011   K 3.5 05/19/2011   CL 93* 05/19/2011   CO2 33* 05/19/2011   Lab Results  Component Value Date   CKTOTAL 206* 05/19/2011   CKMB 6.8* 05/19/2011   TROPONINI <0.30 05/19/2011   Lab Results  Component Value Date   WBC 8.6 05/18/2011   HGB 14.4 05/18/2011   HCT 43.2 05/18/2011   MCV 87.6 05/18/2011   PLT 215 05/18/2011   Lab Results  Component Value Date   CHOL 248* 05/19/2011   Lab Results  Component Value Date   ALT 29 05/17/2011   AST 21 05/17/2011   ALKPHOS 100 05/17/2011   BILITOT 0.5 05/17/2011     Radiology:     CXR 1/10 1. Cardiomegaly without pulmonary edema.  2. Improved aeration in the right lung base with persistent mild patchy density.   EKG:  NSR rate 94, abnormal P wave axis, RAD, possible RAE and LAE, no acute ST T wave changes.  Suspect lead reversal.   ASSESSMENT: 1) CVA, acute 2) CAD s/p CABG 2007 3) Atypical CP 4) A/c systolic CHF       --Echo with newly decreased EF 15-20% 5) DM2 6) HTN 7) Hypokalemia 8) Hyperlipidemia  PLAN/DISCUSSION:  I have reviewed her echo and also reviewed previous echo and cardiac history. LV function is severely reduced - which appears new. CP is quite atypical and CEs are negative. Ideally would proceed with cardiac cath to further evaluate but given recent CVA would like to avoid invasive procedure at this time if possible. Will start with Lexiscan Myoview to further evaluate- she is going down now.  She c/o dyspnea but neck veins look flat and does not appear low output. Cr continues to increase and I suspect she is dry. Will  await labs this am before making a decision what to do with lasix. Carvedilol and spiro added yesterday. May need to consider digoxin.   If  Myoview high risk will need cath. Given ?infiltrate at right base may need to consider short course of abx if cough persists.   Signed: Arvilla Meres 05/20/2011, 8:01 AM

## 2011-05-20 NOTE — ED Provider Notes (Signed)
Medical screening examination/treatment/procedure(s) were performed by non-physician practitioner and as supervising physician I was immediately available for consultation/collaboration.  Flint Melter, MD 05/20/11 [phone]

## 2011-05-20 NOTE — Progress Notes (Signed)
*  PRELIMINARY RESULTS*   Carotid Dopplers completed.  There is no signifciant ICA Stenosis.  Vertebral artery flow not insonated on the right.  Vertebral artery flow is antegrade on the left.    Sherren Kerns Renee 05/20/2011, 12:26 PM

## 2011-05-20 NOTE — Progress Notes (Signed)
PROGRESS NOTE  [name] BJY:[id number] DOB: [birth year] DOA: 05/18/2011 PCP: Ancil Boozer, MD, MD Cardiologist: Kristeen Miss, MD  Brief narrative: 66 year old woman presented initially the day prior to admission with chest pain and strokelike symptoms. She left AMA from the emergency department. She presented yesterday into the emergency department with a history of right, face, arm and leg weakness.. Past swallow screen.  Past medical history: Stroke x2, coronary artery disease, CABG, diabetes mellitus  Consultants:  Neurology  Cardiology  Physical therapy: Home health physical therapy.  Procedures:  January 12: 2-D echocardiogram: Left ventricle: LVEF is approximately25% with diffuse hypokinesis, worse in the inferior, inferoseptal and posteriorwalls. The cavity size was normal. Wall thickness was normal. Doppler parameters are consistent with abnormal left ventricular relaxation (grade 1 diastolic dysfunction).  January 13: Bilateral carotid Dopplers: No significant ICA stenosis.  January 13: Nuclear medicine myocardial perfusion scan: IMPRESSION: 1. Large fixed defect involving the apex and inferior lateral wall. 2. No areas of reversibility to suggest inducible ischemia. 3. Global hypokinesis, with ejection fraction estimated at 31%.  Antibiotics:  None  Interim History: Interval documentation reviewed. Subjective: No further chest pain. Perhaps some improvement in her right arm strength. No new issues.  Objective: Filed Vitals:   05/20/11 0926 05/20/11 0928 05/20/11 0930 05/20/11 1035  BP: 149/90 126/93 150/91 113/75  Pulse: 81 88 94 79  Temp:      TempSrc:      Resp:      Height:      Weight:      SpO2:       No intake or output data in the 24 hours ending 05/20/11 1138  Exam:  General: Appears calm and comfortable. Lying in bed. Cardiovascular: Regular rate and rhythm. No murmur, rub or gallop. Normal respiratory effort. Telemetry: Sinus  rhythm with PVCs. Respiratory: Clear to auscultation bilaterally. No wheezes, rales or rhonchi. Normal respiratory effort. Musculoskeletal: Right upper and lower extremity strength is improving.  Data Reviewed: Basic Metabolic Panel:  Lab 05/19/11 0865 05/18/11 2307 05/18/11 1404 05/17/11 1358  NA 137 -- 139 141  K 3.5 -- 3.1* --  CL 93* -- 93* 100  CO2 33* -- 31 30  GLUCOSE 294* -- 306* 171*  BUN 16 -- 13 11  CREATININE 1.21* 1.12* 1.04 0.90  CALCIUM 9.3 -- 9.6 9.5  MG -- -- -- --  PHOS -- -- -- --   Liver Function Tests:  Lab 05/17/11 1358  AST 21  ALT 29  ALKPHOS 100  BILITOT 0.5  PROT 7.5  ALBUMIN 3.2*   CBC:  Lab 05/18/11 2307 05/18/11 1404 05/17/11 1358  WBC 8.6 8.9 8.0  NEUTROABS -- -- 5.1  HGB 14.4 15.1* 14.4  HCT 43.2 44.1 43.3  MCV 87.6 86.6 86.3  PLT 215 217 194   Cardiac Enzymes:  Lab 05/19/11 0744 05/18/11 2301 05/18/11 1404 05/17/11 1358  CKTOTAL 206* 236* 254* 356*  CKMB 6.8* 7.5* 8.1* 11.3*  CKMBINDEX -- -- -- --  TROPONINI <0.30 <0.30 <0.30 <0.30   CBG:  Lab 05/20/11 0734 05/19/11 2039 05/19/11 1702 05/19/11 1320 05/19/11 0746  GLUCAP 167* 270* 260* 402* 134*    Studies: Dg Chest 2 View  05/18/2011  *RADIOLOGY REPORT*  Clinical Data: Left chest pain.  Shortness of breath for several weeks.  General weakness for 6-8 weeks.  History of bypass surgery, smoking.  History of stroke on 05/16/2011.  CHEST - 2 VIEW  Comparison: 05/17/2011  Findings: Patient has had median sternotomy and CABG.  The heart is enlarged.  There is minimal residual density at the right lung base but there has been improvement in aeration in the right lower lobe. No pleural effusions or new infiltrates.  Degenerative changes are seen in the spine.  IMPRESSION:  1.  Cardiomegaly without pulmonary edema. 2.  Improved aeration in the right lung base with persistent mild patchy density.  Original Report Authenticated By: Patterson Hammersmith, M.D.   Ct Head Wo  Contrast  05/17/2011  *RADIOLOGY REPORT*  Clinical Data: Headache.  Dizziness.  Unable to move right hand. Previous stroke.  CT HEAD WITHOUT CONTRAST  Technique:  Contiguous axial images were obtained from the base of the skull through the vertex without contrast.  Comparison: 03/13/ 2006  Findings: There is no evidence of intracranial hemorrhage, brain edema or other signs of acute infarction.  There is no evidence of intracranial mass lesion or mass effect.  No abnormal extra-axial fluid collections are identified.  Ventricles normal in size.  Mild chronic small vessel disease is noted.  No other intracranial abnormality identified.  No skull fracture or other bone abnormality.  IMPRESSION:  1.  No acute intracranial abnormality. 2.  Mild chronic small vessel disease.  Original Report Authenticated By: Danae Orleans, M.D.    Scheduled Meds:    . aspirin  300 mg Rectal Daily   Or  . aspirin  325 mg Oral Daily  . carvedilol  3.125 mg Oral BID WC  . clopidogrel  75 mg Oral Daily  . DULoxetine  60 mg Oral Daily  . enoxaparin  40 mg Subcutaneous Q24H  . folic acid  1 mg Oral Daily  . furosemide  20 mg Oral BID  . insulin aspart  0-9 Units Subcutaneous TID WC  . insulin glargine  23 Units Subcutaneous QHS  . lisinopril  20 mg Oral Daily  . pantoprazole  20 mg Oral Daily  . potassium chloride  10 mEq Oral BID  . simvastatin  20 mg Oral q1800  . spironolactone  12.5 mg Oral Daily   Continuous Infusions:    . sodium chloride       Assessment/Plan: 1. Recurrent right acute ischemic cerebral infarction: Aspirin, Plavix. Complete stroke evaluation. TCD as outpatient. Simvastatin. 2. Chest pain/elevated CK-MB/total CK: Atypical. Cardiac enzymes negative. Lexiscan noted. 3. Acute systolic congestive heart failure: Left ventricular ejection fraction 15-20%. 4. Hyperlipidemia/hypertriglyceridemia: Zocor. 5. Lasix, lisinopril. Followup 2-D echocardiogram. 6. Diabetes mellitus: Stable. Continue  Lantus and sliding scale insulin. 7. Cigarette smoker: Recommend cessation  Code Status: Full code.  Disposition Plan: Pending further evaluation and treatment.   Brendia Sacks, MD  [facility] Pager [phone] 05/20/2011, 11:38 AM    LOS: 2 days

## 2011-05-21 ENCOUNTER — Inpatient Hospital Stay (HOSPITAL_COMMUNITY): Payer: Medicare Other

## 2011-05-21 DIAGNOSIS — I6350 Cerebral infarction due to unspecified occlusion or stenosis of unspecified cerebral artery: Principal | ICD-10-CM

## 2011-05-21 LAB — GLUCOSE, CAPILLARY
Glucose-Capillary: 137 mg/dL — ABNORMAL HIGH (ref 70–99)
Glucose-Capillary: 125 mg/dL — ABNORMAL HIGH (ref 70–99)
Glucose-Capillary: 197 mg/dL — ABNORMAL HIGH (ref 70–99)

## 2011-05-21 MED ORDER — IOHEXOL 350 MG/ML SOLN
50.0000 mL | Freq: Once | INTRAVENOUS | Status: AC | PRN
  Administered 2011-05-21: 50 mL via INTRAVENOUS

## 2011-05-21 MED FILL — Regadenoson IV Inj 0.4 MG/5ML (0.08 MG/ML): INTRAVENOUS | Qty: 5 | Status: AC

## 2011-05-21 NOTE — Progress Notes (Signed)
Speech Language Pathology Evaluation Patient Details Name: [name] MRN: [id number] DOB: [birth year] Today's Date: 05/21/2011  Problem List:  Patient Active Problem List  Diagnoses  . CVA (cerebral infarction)  . Chest pain  . CAD (coronary artery disease)  . HTN (hypertension)  . Dyslipidemia  . Tobacco abuse  . Acute on chronic systolic heart failure   Past Medical History:  Past Medical History  Diagnosis Date  . Stroke   . Coronary artery disease   . Ear infection     right  . Diabetes mellitus   . Hypertension   . 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 Asthma   . Bronchitis   . Reflux    Past Surgical History:  Past Surgical History  Procedure Date  . Coronary artery bypass graft     LIMA to RI, SVG to distal RCA, SVG to LAD, SVG to OM. 2007  . Joint replacement     Right hip replacement   . Debridement skin / sq / muscle of trunk     s/p staph infection from CABG 2007  . Cystectomy     Subcutaneous    SLP Assessment/Plan/Recommendation Assessment Clinical Impression Statement: Demonstrates functional communicative-cognitive skills for making both basic and complex needs known, following complex directions and recalling important new medical information.  No skilled SLP needs at this time. SLP Recommendation/Assessment: Patient does not need any further Speech Lanaguage Pathology Services No Skilled Speech Therapy: All education completed;Patient at baseline level of functioning SLP Recommendations Follow up Recommendations: None Individuals Consulted Consulted and Agree with Results and Recommendations: Patient  SLP Evaluation Prior Functioning Cognitive/Linguistic Baseline: Within functional limits Type of Home: House Lives With: Other (Comment) Receives Help From: Family Vocation: Retired  IT consultant Overall Cognitive Status: Appears within functional limits for tasks assessed Arousal/Alertness: Awake/alert Orientation Level: Oriented  X4 Attention: Selective;Alternating Alternating Attention: Appears intact Memory: Appears intact Awareness: Appears intact Problem Solving: Appears intact Safety/Judgment: Appears intact  Comprehension Auditory Comprehension Overall Auditory Comprehension: Appears within functional limits for tasks assessed Yes/No Questions: Within Functional Limits Commands: Within Functional Limits Conversation: Complex Reading Comprehension Reading Status: Within funtional limits  Expression Expression Primary Mode of Expression: Verbal Verbal Expression Overall Verbal Expression: Appears within functional limits for tasks assessed Initiation: No impairment Level of Generative/Spontaneous Verbalization: Conversation Repetition: No impairment Naming: No impairment Pragmatics: No impairment Written Expression Dominant Hand: Right Written Expression: Not tested  Oral/Motor Oral Motor/Sensory Function Overall Oral Motor/Sensory Function: Impaired at baseline Labial ROM: Within Functional Limits Labial Symmetry: Within Functional Limits Labial Strength: Within Functional Limits Labial Sensation: Within Functional Limits Lingual ROM: Within Functional Limits Lingual Symmetry: Within Functional Limits Lingual Strength: Within Functional Limits Lingual Sensation: Within Functional Limits Facial ROM: Reduced left Facial Symmetry: Left drooping eyelid;Left droop (very mild) Facial Strength: Reduced Facial Sensation: Within Functional Limits Velum: Within Functional Limits Mandible: Within Functional Limits Motor Speech Overall Motor Speech: Appears within functional limits for tasks assessed Respiration: Within functional limits Phonation: Normal Resonance: Within functional limits Articulation: Within functional limitis Intelligibility: Intelligible Motor Planning: Witnin functional limits  Myra Rude, M.S.,CCC-SLP Pager [phone] 05/21/2011, 11:18 AM

## 2011-05-21 NOTE — Progress Notes (Signed)
05/21/2011 [facility] Case Management Note [phone]  Utilization review completed.  

## 2011-05-21 NOTE — Consult Note (Signed)
Pt smokes between 1/2 -1 ppd. She states that she's thinking about quitting. Pt in contemplation stage. Advised and encouraged pt to quit. Referred to 1-800 quit now for f/u and support. Discussed oral fixation substitutes, second hand smoke and in home smoking policy. Reviewed and gave pt Written education/contact information.

## 2011-05-21 NOTE — Progress Notes (Signed)
Subjective:   [name] is a 66 year old female with a history of coronary artery disease-status post coronary artery bypass grafting in 2007 page also has a history of hypertension, type 2 diabetes mellitus. She was recently admitted with a stroke. Her most recent assessment of left ventricular systolic function but feels an LV EF of 30%.  She had a stress Myoview on January 13 which revealed a large fixed inferior lateral and apical scar. There is no evidence of ischemia.      Marland Kitchen aspirin  300 mg Rectal Daily   Or  . aspirin  325 mg Oral Daily  . carvedilol  3.125 mg Oral BID WC  . clopidogrel  75 mg Oral Daily  . DULoxetine  60 mg Oral Daily  . enoxaparin  40 mg Subcutaneous Q24H  . folic acid  1 mg Oral Daily  . furosemide  20 mg Oral BID  . insulin aspart  0-9 Units Subcutaneous TID WC  . insulin glargine  23 Units Subcutaneous QHS  . lisinopril  20 mg Oral Daily  . pantoprazole  20 mg Oral Daily  . potassium chloride  10 mEq Oral BID  . simvastatin  20 mg Oral q1800  . spironolactone  12.5 mg Oral Daily      . sodium chloride      Objective:  Vital Signs in the last 24 hours: Blood pressure 109/70, pulse 69, temperature 98.5 F (36.9 C), temperature source Oral, resp. rate 18, height 5' (1.524 m), weight 143 lb 1.3 oz (64.9 kg), SpO2 93.00%. Temp:  [97.4 F (36.3 C)-98.5 F (36.9 C)] 98.5 F (36.9 C) (01/14 0400) Pulse Rate:  [66-94] 69  (01/14 0400) Resp:  [16-18] 18  (01/14 0400) BP: (109-155)/(70-100) 109/70 mmHg (01/14 0400) SpO2:  [93 %-95 %] 93 % (01/14 0400) Weight:  [143 lb 1.3 oz (64.9 kg)] 143 lb 1.3 oz (64.9 kg) (01/14 0400)  Intake/Output from previous day: 01/13 0701 - 01/14 0700 In: 120 [P.O.:120] Out: -  Intake/Output from this shift:    Physical Exam: The patient is alert and oriented x 3.  The mood and affect are normal.   Skin: warm and dry.  Color is normal.    HEENT:   the sclera are nonicteric.  The mucous membranes are moist.   The carotids are 2+ without bruits.  There is no thyromegaly.  There is no JVD.    Lungs: clear.  The chest wall is non tender.    Heart: regular rate with a normal S1 and S2.  There are no murmurs, gallops, or rubs. The PMI is not displaced.     Abdomen: good bowel sounds.  There is no guarding or rebound.  There is no hepatosplenomegaly or tenderness.  There are no masses.   Extremities:  no clubbing, cyanosis, or edema.  The legs are without rashes.  The distal pulses are intact.   Neuro:  Cranial nerves II - XII are intact.  Motor and sensory functions are intact.     Lab Results:  [facility] 05/18/11 2307 05/18/11 1404  WBC 8.6 8.9  HGB 14.4 15.1*  PLT 215 217    Basename 05/20/11 1150 05/19/11 1505  NA 141 137  K 3.3* 3.5  CL 97 93*  CO2 33* 33*  GLUCOSE 186* 294*  BUN 21 16  CREATININE 1.08 1.21*    Basename 05/19/11 0744 05/18/11 2301  TROPONINI <0.30 <0.30   No results found for this basename: BNP in the last  72 hours Hepatic Function Panel No results found for this basename: PROT,ALBUMIN,AST,ALT,ALKPHOS,BILITOT,BILIDIR,IBILI in the last 72 hours Lab Results  Component Value Date   CHOL 248* 05/19/2011   HDL 34* 05/19/2011   LDLCALC 183* 05/19/2011   TRIG 153* 05/19/2011   CHOLHDL 7.3 05/19/2011   No results found for this basename: INR in the last 72 hours  Tele: NSR.   Assessment/Plan:    CVA (cerebral infarction) (05/18/2011)  The patient has had a stroke in the setting of markedly reduced LV function. Some consideration should be given to starting her on Coumadin instead of Plavix.   CAD (coronary artery disease) (05/18/2011)  patient has a history of coronary artery disease and is status post coronary artery bypass grafting. Her Myoview study from yesterday reveals a large scar in the apex and inferolateral wall. There is no evidence of ischemia. I do not think that she needs a cardiac catheterization at this point. We will review her previous  studies.  chronic systolic heart failure (05/19/2011)  patient presents with a stroke. She has markedly reduced left ventricular systolic function. There is no evidence of acute exacerbation of her congestive heart failure. I suspect that this is due to a previous myocardial infarction.   Disposition:  Alvia Grove., MD, [facility] 05/21/2011, 7:57 AM LOS: Day 3

## 2011-05-21 NOTE — Progress Notes (Signed)
Inpatient Diabetes Program Recommendations  AACE/ADA: New Consensus Statement on Inpatient Glycemic Control (2009)  Target Ranges:  Prepandial:   less than 140 mg/dL      Peak postprandial:   less than 180 mg/dL (1-2 hours)      Critically ill patients:  140 - 180 mg/dL   Reason for Visit: Results for ELVY, MCLARTY (MRN [id number]) as of 05/21/2011 09:50  Ref. Range 05/20/2011 07:34 05/20/2011 11:41 05/20/2011 16:51 05/20/2011 21:52 05/21/2011 07:36  Glucose-Capillary Latest Range: 70-99 mg/dL 308 (H) 657 (H) 846 (H) 137 (H) 125 (H)  Note CBG's increased at lunch and supper.  Would benefit from carbohydrate coverage at meals.  Inpatient Diabetes Program Recommendations Insulin - Meal Coverage: Consider adding Novolog meal coverage 4 units tid with meals. HgbA1C: Note A1C 8.9% indicating average CBG's 207 mg/dL.  Will need follow-up with PCP.  Note: Will follow.

## 2011-05-21 NOTE — Progress Notes (Signed)
PROGRESS NOTE  [name] ZOX:[id number] DOB: 1945/09/13 DOA: 05/18/2011 PCP: Ancil Boozer, MD, MD Cardiologist: Kristeen Miss, MD  Brief narrative: 66 year old woman presented initially the day prior to admission with chest pain and strokelike symptoms. She left AMA from the emergency department. She presented yesterday into the emergency department with a history of right, face, arm and leg weakness. Passed swallow screen.  Past medical history: Stroke x2, coronary artery disease, CABG, diabetes mellitus  Consultants:  Neurology  Cardiology  Physical therapy: Home health physical therapy.  Procedures:  January 12: 2-D echocardiogram: Left ventricle: LVEF is approximately25% with diffuse hypokinesis, worse in the inferior, inferoseptal and posteriorwalls. The cavity size was normal. Wall thickness was normal. Doppler parameters are consistent with abnormal left ventricular relaxation (grade 1 diastolic dysfunction).  January 13: Bilateral carotid Dopplers: No significant ICA stenosis.  January 13: Nuclear medicine myocardial perfusion scan: IMPRESSION: 1. Large fixed defect involving the apex and inferior lateral wall. 2. No areas of reversibility to suggest inducible ischemia. 3. Global hypokinesis, with ejection fraction estimated at 31%.  Antibiotics:  None  Interim History: Interval documentation reviewed. Subjective: No further chest pain. Feels much better.  Objective: Filed Vitals:   05/21/11 1207 05/21/11 1400 05/21/11 1653 05/21/11 1706  BP:  102/66 149/110 120/83  Pulse: 80 72 82 82  Temp:  97.8 F (36.6 C) 97.9 F (36.6 C)   TempSrc:  Oral Oral   Resp:  18 21   Height:      Weight:      SpO2: 98% 94% 93%     Intake/Output Summary (Last 24 hours) at 05/21/11 1735 Last data filed at 05/20/11 2200  Gross per 24 hour  Intake    120 ml  Output      0 ml  Net    120 ml    Exam:  General: Appears calm and comfortable. Lying in  bed. Cardiovascular: Regular rate and rhythm. No murmur, rub or gallop. Normal respiratory effort. Telemetry: Sinus rhythm with PVCs. Respiratory: Clear to auscultation bilaterally. No wheezes, rales or rhonchi. Normal respiratory effort. Musculoskeletal: Right upper and lower extremity strength is improving.  Data Reviewed: Basic Metabolic Panel:  Lab 05/20/11 8119 05/19/11 1505 05/18/11 2307 05/18/11 1404 05/17/11 1358  NA 141 137 -- 139 141  K 3.3* 3.5 -- -- --  CL 97 93* -- 93* 100  CO2 33* 33* -- 31 30  GLUCOSE 186* 294* -- 306* 171*  BUN 21 16 -- 13 11  CREATININE 1.08 1.21* 1.12* 1.04 0.90  CALCIUM 9.1 9.3 -- 9.6 9.5  MG -- -- -- -- --  PHOS -- -- -- -- --   Liver Function Tests:  Lab 05/17/11 1358  AST 21  ALT 29  ALKPHOS 100  BILITOT 0.5  PROT 7.5  ALBUMIN 3.2*   CBC:  Lab 05/18/11 2307 05/18/11 1404 05/17/11 1358  WBC 8.6 8.9 8.0  NEUTROABS -- -- 5.1  HGB 14.4 15.1* 14.4  HCT 43.2 44.1 43.3  MCV 87.6 86.6 86.3  PLT 215 217 194   Cardiac Enzymes:  Lab 05/19/11 0744 05/18/11 2301 05/18/11 1404 05/17/11 1358  CKTOTAL 206* 236* 254* 356*  CKMB 6.8* 7.5* 8.1* 11.3*  CKMBINDEX -- -- -- --  TROPONINI <0.30 <0.30 <0.30 <0.30   CBG:  Lab 05/21/11 1657 05/21/11 1207 05/21/11 0736 05/20/11 2152 05/20/11 1651  GLUCAP 197* 218* 125* 137* 288*    Studies: Dg Chest 2 View  05/18/2011  *RADIOLOGY REPORT*  Clinical Data: Left chest  pain.  Shortness of breath for several weeks.  General weakness for 6-8 weeks.  History of bypass surgery, smoking.  History of stroke on 05/16/2011.  CHEST - 2 VIEW  Comparison: 05/17/2011  Findings: Patient has had median sternotomy and CABG.  The heart is enlarged.  There is minimal residual density at the right lung base but there has been improvement in aeration in the right lower lobe. No pleural effusions or new infiltrates.  Degenerative changes are seen in the spine.  IMPRESSION:  1.  Cardiomegaly without pulmonary edema. 2.   Improved aeration in the right lung base with persistent mild patchy density.  Original Report Authenticated By: Patterson Hammersmith, M.D.   Ct Head Wo Contrast  05/17/2011  *RADIOLOGY REPORT*  Clinical Data: Headache.  Dizziness.  Unable to move right hand. Previous stroke.  CT HEAD WITHOUT CONTRAST  Technique:  Contiguous axial images were obtained from the base of the skull through the vertex without contrast.  Comparison: 03/13/ 2006  Findings: There is no evidence of intracranial hemorrhage, brain edema or other signs of acute infarction.  There is no evidence of intracranial mass lesion or mass effect.  No abnormal extra-axial fluid collections are identified.  Ventricles normal in size.  Mild chronic small vessel disease is noted.  No other intracranial abnormality identified.  No skull fracture or other bone abnormality.  IMPRESSION:  1.  No acute intracranial abnormality. 2.  Mild chronic small vessel disease.  Original Report Authenticated By: Danae Orleans, M.D.    Scheduled Meds:    . aspirin  300 mg Rectal Daily   Or  . aspirin  325 mg Oral Daily  . carvedilol  3.125 mg Oral BID WC  . clopidogrel  75 mg Oral Daily  . DULoxetine  60 mg Oral Daily  . enoxaparin  40 mg Subcutaneous Q24H  . folic acid  1 mg Oral Daily  . furosemide  20 mg Oral BID  . insulin aspart  0-9 Units Subcutaneous TID WC  . insulin glargine  23 Units Subcutaneous QHS  . lisinopril  20 mg Oral Daily  . pantoprazole  20 mg Oral Daily  . potassium chloride  10 mEq Oral BID  . simvastatin  20 mg Oral q1800  . spironolactone  12.5 mg Oral Daily   Continuous Infusions:    . sodium chloride       Assessment/Plan: 1. Recurrent right acute ischemic cerebral infarction: Plavix. Stop aspirin per neurology. Complete stroke evaluation. TCD as outpatient. Simvastatin. Followup CTA head/neck. 2. Chest pain/elevated CK-MB/total CK: Atypical. Cardiac enzymes negative. Lexiscan noted. 3. Acute systolic congestive  heart failure: Left ventricular ejection fraction 15-20%. 4. Hyperlipidemia/hypertriglyceridemia: Zocor. 5. Diabetes mellitus: Stable. Continue Lantus and sliding scale insulin. 6. Cigarette smoker: Recommend cessation  Code Status: Full code.  Disposition Plan: Hopefully home January 15.   Brendia Sacks, MD  [facility] Pager [phone] 05/21/2011, 5:35 PM    LOS: 3 days

## 2011-05-21 NOTE — Progress Notes (Signed)
Physical Therapy Treatment Patient Details Name: [name] MRN: [id number] DOB: 07-13-1945 Today's Date: 05/21/2011  PT Assessment/Plan  PT - Assessment/Plan Comments on Treatment Session: PT reports long standing history of RLE weakness and balance problems (I roll my ankles on uneven terrain constantly and theres nothing you can do about it", when asked to perform head turns pt became frustrated and stated she has "problem with R ear, wax build up and other stuff"; pt currently states the only  issue that has not resolved is use of R hand. Discussed benefits of OPPT for balance training and fall prevention, pt initially agreed but then when discussed therapy for her hand she said "I did this all by myself last time and probably will just do that again".  Discussed safety in the home/fall prevention to which pt verbalized understanding.  PT does agree to continue PT during acute stay. PT Plan: Discharge plan remains appropriate Follow Up Recommendations: Outpatient PT PT Goals  Acute Rehab PT Goals PT Goal: Supine/Side to Sit - Progress: Met PT Goal: Sit to Stand - Progress: Progressing toward goal PT Goal: Stand to Sit - Progress: Progressing toward goal PT Goal: Ambulate - Progress: Progressing toward goal  PT Treatment Precautions/Restrictions  Precautions Precautions: Fall Restrictions Weight Bearing Restrictions: No Mobility (including Balance) Bed Mobility Bed Mobility: Yes Rolling Right: 7: Independent Right Sidelying to Sit: 7: Independent Transfers Sit to Stand: 5: Supervision;Without upper extremity assist Stand to Sit: 5: Supervision Ambulation/Gait Ambulation/Gait: Yes Ambulation/Gait Assistance: 5: Supervision Ambulation/Gait Assistance Details (indicate cue type and reason): pt grabbing nearby objects for stability, PT asked if she would like cane and she said yes but that she would not use in her home becasue she could grab furniture Ambulation Distance  (Feet): 400 Feet Assistive device: Straight cane Gait Pattern: Step-through pattern;Decreased weight shift to right;Decreased stance time - right Gait velocity: pt reports gait pattern is unchanged Stairs: No  Posture/Postural Control Posture/Postural Control: Postural limitations Postural Limitations: decreased hip and stepping strategy with mod perturbations Pt states she needs some time upon standing to get her stability.   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  [name] 05/21/2011, 12:16 PM

## 2011-05-21 NOTE — Progress Notes (Signed)
Stroke Team Progress Note  HISTORY Ms. Rocchio is a 66y/o woman with history of 2 previous strokes who presented on 1/9 with new onset right face, arm, and leg weakness. She was initially seen at [facility] but refused admission. She presented to [facility] ED when her symptoms worsened but was then out of the tPA window. NIHSS was 6 on admission. She was on aspirin and Plavix at home for antiplatelet therapy. A left carotid bruit was noted on her admission exam concerning for left carotid stenosis. CT head did not reveal evidence of any acute abnormality.  SUBJECTIVE No family is at the bedside. Overall she feels her condition is marginally improving. She still has a slight slurred speech and right face and hand weakness.She states she has recovered  well from her prior strokes but was told that she has some blockage in the blood vessel in the back of her neck.  OBJECTIVE Most recent Vital Signs: Temp: 97.8 F (36.6 C) (01/14 0807) BP: 102/67 mmHg (01/14 0900) Pulse Rate: 57  (01/14 0900) Respiratory Rate: 18 O2 Saturation: 92%  CBG (last 3)  Basename 05/21/11 0736 05/20/11 2152 05/20/11 1651  GLUCAP 125* 137* 288*   Intake/Output from previous day: 01/13 0701 - 01/14 0700 In: 120 [P.O.:120] Out: -   IV Fluid Intake:     . sodium chloride     Medications    . aspirin  300 mg Rectal Daily   Or  . aspirin  325 mg Oral Daily  . carvedilol  3.125 mg Oral BID WC  . clopidogrel  75 mg Oral Daily  . DULoxetine  60 mg Oral Daily  . enoxaparin  40 mg Subcutaneous Q24H  . folic acid  1 mg Oral Daily  . furosemide  20 mg Oral BID  . insulin aspart  0-9 Units Subcutaneous TID WC  . insulin glargine  23 Units Subcutaneous QHS  . lisinopril  20 mg Oral Daily  . pantoprazole  20 mg Oral Daily  . potassium chloride  10 mEq Oral BID  . simvastatin  20 mg Oral q1800  . spironolactone  12.5 mg Oral Daily  PRN albuterol, ALPRAZolam, diphenhydrAMINE, HYDROcodone-acetaminophen,  nitroGLYCERIN, ondansetron (ZOFRAN) IV, senna-docusate  Diet:  Carb Control thin liquids Activity:  Up with assistance DVT Prophylaxis:  Lovenox 40 mg sq daily   Studies: CBC    Component Value Date/Time   WBC 8.6 05/18/2011 2307   RBC 4.93 05/18/2011 2307   HGB 14.4 05/18/2011 2307   HCT 43.2 05/18/2011 2307   PLT 215 05/18/2011 2307   MCV 87.6 05/18/2011 2307   MCH 29.2 05/18/2011 2307   MCHC 33.3 05/18/2011 2307   RDW 14.9 05/18/2011 2307   LYMPHSABS 2.0 05/17/2011 1358   MONOABS 0.6 05/17/2011 1358   EOSABS 0.2 05/17/2011 1358   BASOSABS 0.0 05/17/2011 1358   CMP    Component Value Date/Time   NA 141 05/20/2011 1150   K 3.3* 05/20/2011 1150   CL 97 05/20/2011 1150   CO2 33* 05/20/2011 1150   GLUCOSE 186* 05/20/2011 1150   BUN 21 05/20/2011 1150   CREATININE 1.08 05/20/2011 1150   CALCIUM 9.1 05/20/2011 1150   PROT 7.5 05/17/2011 1358   ALBUMIN 3.2* 05/17/2011 1358   AST 21 05/17/2011 1358   ALT 29 05/17/2011 1358   ALKPHOS 100 05/17/2011 1358   BILITOT 0.5 05/17/2011 1358   GFRNONAA 53* 05/20/2011 1150   GFRAA 61* 05/20/2011 1150   COAGS Lab Results  Component Value Date   INR 1.07 05/17/2011   INR 2.04* 02/04/2010   INR 2.14* 02/03/2010   Lipid Panel    Component Value Date/Time   CHOL 248* 05/19/2011 0530   TRIG 153* 05/19/2011 0530   HDL 34* 05/19/2011 0530   CHOLHDL 7.3 05/19/2011 0530   VLDL 31 05/19/2011 0530   LDLCALC 183* 05/19/2011 0530   HgbA1C  Lab Results  Component Value Date   HGBA1C 8.9* 05/19/2011   Urine Drug Screen  No results found for this basename: labopia, cocainscrnur, labbenz, amphetmu, thcu, labbarb    Alcohol Level No results found for this basename: eth    BNP 1842  Nm Myocar Multi W/spect W/wall Motion / Ef  1.  Large fixed defect involving the apex and inferior lateral wall. 2.  No areas of reversibility to suggest inducible ischemia. 3.  Global hypokinesis, with ejection fraction estimated at 31%.    CT of the brain  Canceled   CT angio  Will  order  MRI of the brain  Pt cannot tolerate  MRA of the brain  Pt cannot tolerate  2D Echocardiogram  Left ventricle: LVEF is approximately 25% with diffuse hypokinesis, worse in the inferior, inferoseptal and posterior walls. The cavity size was normal. Wall thickness was normal. Doppler parameters are consistent with abnormal left ventricular relaxation (grade 1 diastolicdysfunction) Aortic valve: AV is thickened, moderately calcified with very mild restricted motion.  Carotid Doppler  ordered   CXR   1.  Cardiomegaly without pulmonary edema. 2.  Improved aeration in the right lung base with persistent mild patchy density.    EKG  unchanged from previous tracings, normal sinus rhythm.   Physical Exam   Present elderly lady currently not in distress.afebrile. Head is non traumatic with soft left carotid bruit. Cardiac exam irregular heart sounds. Lungs good auscultation. Abdomen soft nontender. Neurological exam : Awake and alert oriented to time place and person. Speech and language appear normal. There is minimal dysarthria. There is mild right lower facial weakness. Tongue is midline. There is no ophthalmoplegia. She blinks to threat bilaterally. There is no upper or lower extremity drift but right grip is weak. The intrinsic hand muscles are weak on the right. She orbits left-to-right approximately. There is 4+ out of 5 weakness in the right upper exudate. Is normal strength on the left. Coordination is slow but that all right. There is mild right-sided sensory loss. Plantars are both downgoing. Gait was not tested. ASSESSMENT Ms. TYLISHA DANIS is a 66 y.o. female with a likely new left brain stroke in setting of previous stroke in that area, secondary to unknown etiology, workup underway.  On aspirin 81 mg orally every day and clopidogrel 75 mg orally every day for secondary stroke prevention.  Stroke risk factors:  diabetes mellitus, hypertension and CAD, EF 25%, previous  stroke  Hospital day # 3  TREATMENT/PLAN Continue clopidogrel 75 mg orally every day for secondary stroke prevention. Stop ASA. CTA head and neck. D/c carotid dopplers. OOB. Therapy evals.  Joaquin Music, ANP-BC, GNP-BC [facility] Pager: [phone] 05/21/2011 10:22 AM  Dr. Delia Heady, Stroke Center Medical Director, has personally reviewed chart, pertinent data, examined the patient and developed the plan of care.

## 2011-05-22 DIAGNOSIS — I5022 Chronic systolic (congestive) heart failure: Secondary | ICD-10-CM | POA: Diagnosis present

## 2011-05-22 LAB — GLUCOSE, CAPILLARY
Glucose-Capillary: 89 mg/dL (ref 70–99)
Glucose-Capillary: 194 mg/dL — ABNORMAL HIGH (ref 70–99)

## 2011-05-22 MED ORDER — CARVEDILOL 6.25 MG PO TABS
6.2500 mg | ORAL_TABLET | Freq: Two times a day (BID) | ORAL | Status: DC
  Filled 2011-05-22 (×2): qty 1

## 2011-05-22 MED ORDER — CARVEDILOL 6.25 MG PO TABS: 6.2500 mg | ORAL_TABLET | Freq: Two times a day (BID) | ORAL | Status: DC

## 2011-05-22 MED ORDER — SPIRONOLACTONE 12.5 MG HALF TABLET: 12.5000 mg | ORAL_TABLET | Freq: Every day | ORAL | Status: DC

## 2011-05-22 MED ORDER — NITROGLYCERIN 0.4 MG SL SUBL: 0.4000 mg | SUBLINGUAL_TABLET | SUBLINGUAL | Status: DC | PRN

## 2011-05-22 MED ORDER — FUROSEMIDE 20 MG PO TABS: 20.0000 mg | ORAL_TABLET | Freq: Two times a day (BID) | ORAL | Status: DC

## 2011-05-22 NOTE — Progress Notes (Signed)
Inpatient Diabetes Program Recommendations  AACE/ADA: New Consensus Statement on Inpatient Glycemic Control (2009)  Target Ranges:  Prepandial:   less than 140 mg/dL      Peak postprandial:   less than 180 mg/dL (1-2 hours)      Critically ill patients:  140 - 180 mg/dL   Reason for Visit: Results for [name], [name] (MRN [id number]) as of 05/22/2011 12:17  Ref. Range 05/21/2011 16:57 05/21/2011 20:13 05/22/2011 07:25 05/22/2011 11:47  Glucose-Capillary Latest Range: 70-99 mg/dL 528 (H) 413 (H) 89 244 (H)   Fasting CBG's look good.  However lunch, supper and bedtime CBG's elevated.  Inpatient Diabetes Program Recommendations Insulin - Meal Coverage: Consider adding Novolog meal coverage 4 units tid with meals. HgbA1C: Note A1C 8.9% indicating average CBG's 207 mg/dL.  Will need follow-up with PCP.  Note: Will follow.

## 2011-05-22 NOTE — Discharge Summary (Signed)
Physician Discharge Summary  [name] WJX:[id number] DOB: [birth year] DOA: 05/18/2011  PCP: Ancil Boozer, MD, MD  Admit date: 05/18/2011 Discharge date: 05/22/2011  Discharge Diagnoses:  1. Recurrent right acute ischemic cerebral infarction 2. Chest pain/elevated CK-MB 3. Chronic systolic congestive heart failure 4. Hyperlipidemia/hypertriglyceridemia 5. Diabetes mellitus, type II, uncontrolled by hemoglobin A1c 6. Cigarette smoker  Discharge Condition: Improved  Disposition: Home. Refuses physical therapy.  History of present illness:  66 year old woman presented initially the day prior to admission with chest pain and strokelike symptoms. She left AMA from the emergency department. She presented yesterday into the emergency department with a history of right, face, arm and leg weakness. Passed swallow screen.  Hospital Course:  Ms. Ventola was admitted medical floor and seen in consultation with cardiology and neurology. Stroke workup was completed. Neurology recommended Plavix and recommended discontinuing aspirin. She will followup with Dr. Pearlean Brownie in 2 months. She was assessed by speech therapy as well as physical therapy. She has refused any further physical therapy as an outpatient. She was not able to be assessed by occupational therapy prior to discharge however the patient highly motivated and should do well. In regard to her elevated CK-MB she underwent nuclear stress testing which did not show any ischemia. Cardiology adjusted her medications and will follow her up in the outpatient setting. Her diabetes has remained stable and she is stable for discharge. These and other issues delineated below. 1. Recurrent right acute ischemic cerebral infarction: Plavix. Stop aspirin per neurology. TCD as outpatient. Simvastatin. Cleared for discharge per neurology.  2. Chest pain/elevated CK-MB/total CK: Atypical. Cardiac enzymes negative. Lexiscan noted. No further evaluation at this  time per cardiology.  3. Chronic systolic congestive heart failure: Left ventricular ejection fraction 15-20%. Coreg increased to 6.25 mg by mouth twice a day. Continue lisinopril, Lasix.  4. Hyperlipidemia/hypertriglyceridemia: Zocor.  5. Diabetes mellitus: Stable. Continue Lantus and sliding scale insulin.  6. Cigarette smoker: Recommend cessation  Consultants:  Neurology   Cardiology   Physical therapy: Outpatient physical therapy.   Occupational therapy: Not able to evaluate the patient prior to discharge   Speech therapy: No further needs.  Procedures:  January 12: 2-D echocardiogram: Left ventricle: LVEF is approximately 25% with diffuse hypokinesis, worse in the inferior, inferoseptal and posterior walls. The cavity size was normal. Wall thickness was normal. Doppler parameters are consistent with abnormal left ventricular relaxation (grade 1 diastolic dysfunction).   January 13: Bilateral carotid Dopplers: No significant ICA stenosis.  January 13: Nuclear medicine myocardial perfusion scan: IMPRESSION: 1. Large fixed defect involving the apex and inferior lateral wall. 2. No areas of reversibility to suggest inducible ischemia. 3. Global hypokinesis, with ejection fraction estimated at 31%  Discharge Instructions  Discharge Orders    Future Orders Please Complete By Expires   Diet - low sodium heart healthy      Increase activity slowly        Current Discharge Medication List    START taking these medications   Details  carvedilol (COREG) 6.25 MG tablet Take 1 tablet (6.25 mg total) by mouth 2 (two) times daily with a meal. Qty: 60 tablet, Refills: 0    nitroGLYCERIN (NITROSTAT) 0.4 MG SL tablet Place 1 tablet (0.4 mg total) under the tongue every 5 (five) minutes x 3 doses as needed for chest pain. Qty: 90 tablet, Refills: 0    spironolactone (ALDACTONE) 12.5 mg TABS Take 0.5 tablets (12.5 mg total) by mouth daily. Qty: 30 tablet, Refills: 0  CONTINUE  these medications which have CHANGED   Details  furosemide (LASIX) 20 MG tablet Take 1 tablet (20 mg total) by mouth 2 (two) times daily. Qty: 60 tablet, Refills: 0      CONTINUE these medications which have NOT CHANGED   Details  albuterol (PROVENTIL HFA;VENTOLIN HFA) 108 (90 BASE) MCG/ACT inhaler Inhale 2 puffs into the lungs every 6 (six) hours as needed.    albuterol (PROVENTIL) (5 MG/ML) 0.5% nebulizer solution Take 2.5 mg by nebulization every 6 (six) hours as needed. For shortness of breath    clopidogrel (PLAVIX) 75 MG tablet Take 75 mg by mouth daily.    DULoxetine (CYMBALTA) 60 MG capsule Take 60 mg by mouth daily.    folic acid (FOLVITE) 1 MG tablet Take 1 mg by mouth daily.    HYDROcodone-acetaminophen (NORCO) 5-325 MG per tablet Take 1 tablet by mouth every 6 (six) hours as needed.    insulin glargine (LANTUS) 100 UNIT/ML injection Inject 23 Units into the skin at bedtime.     insulin lispro (HUMALOG) 100 UNIT/ML injection Inject 6-10 Units into the skin 3 (three) times daily before meals. Sliding scale    lisinopril (PRINIVIL,ZESTRIL) 20 MG tablet Take 20 mg by mouth daily.    pantoprazole (PROTONIX) 20 MG tablet Take 20 mg by mouth daily.    potassium chloride (K-DUR,KLOR-CON) 10 MEQ tablet Take 10 mEq by mouth 2 (two) times daily.      STOP taking these medications     aspirin 325 MG tablet      metFORMIN (GLUCOPHAGE) 1000 MG tablet        Follow-up Information    Follow up with Gates Rigg, MD. Schedule an appointment as soon as possible for a visit in 2 months.   Contact information:   [address] [facility] [place] [address] [phone]       Follow up with ALM,STEPHANIE, MD in 1 week.   Contact information:   [address] [place] [address] [phone]           The results of significant diagnostics from this hospitalization (including imaging, microbiology,  ancillary and laboratory) are listed below for reference.    Significant Diagnostic Studies: Ct Angio Head W/cm &/or Wo Cm  05/21/2011  *RADIOLOGY REPORT*  Clinical Data:  Stroke  CT ANGIOGRAPHY HEAD  Technique:  Multidetector CT imaging of the head was performed using the standard protocol during bolus administration of intravenous contrast.  Multiplanar CT image reconstructions including MIPs were obtained to evaluate the vascular anatomy.  Contrast: 50mL OMNIPAQUE IOHEXOL 350 MG/ML IV SOLN  Comparison:  CT head 05/17/2011  Findings:  Ventricle size is normal.  Patchy hypodensity in the cerebral white matter bilaterally compatible with chronic microvascular ischemia.  No acute infarct.  Negative for hemorrhage or mass.  The enhancement pattern is normal.  Calvarium is intact.  Both vertebral arteries are patent to the basilar.  Calcified plaque and mild stenosis of the distal left vertebral artery. PICA, AICA, superior cerebellar, and posterior cerebral arteries are patent bilaterally.  Extensive atherosclerotic calcification in the cavernous carotid bilaterally causing mild to moderate stenosis bilaterally. Supraclinoid internal carotid artery is patent bilaterally. Anterior and middle cerebral arteries are patent bilaterally without stenosis.  Negative for aneurysm.   Review of the MIP images confirms the above findings.  IMPRESSION: Intracranial calcified atherosclerotic disease involving the distal left vertebral artery and cavernous carotid bilaterally.  No large vessel occlusion.  Original Report Authenticated By: Camelia Phenes, M.D.   Dg Chest 2 View  05/18/2011  *RADIOLOGY REPORT*  Clinical Data: Left chest pain.  Shortness of breath for several weeks.  General weakness for 6-8 weeks.  History of bypass surgery, smoking.  History of stroke on 05/16/2011.  CHEST - 2 VIEW  Comparison: 05/17/2011  Findings: Patient has had median sternotomy and CABG.  The heart is enlarged.  There is minimal residual  density at the right lung base but there has been improvement in aeration in the right lower lobe. No pleural effusions or new infiltrates.  Degenerative changes are seen in the spine.  IMPRESSION:  1.  Cardiomegaly without pulmonary edema. 2.  Improved aeration in the right lung base with persistent mild patchy density.  Original Report Authenticated By: Patterson Hammersmith, M.D.   Ct Head Wo Contrast  05/17/2011  *RADIOLOGY REPORT*  Clinical Data: Headache.  Dizziness.  Unable to move right hand. Previous stroke.  CT HEAD WITHOUT CONTRAST  Technique:  Contiguous axial images were obtained from the base of the skull through the vertex without contrast.  Comparison: 03/13/ 2006  Findings: There is no evidence of intracranial hemorrhage, brain edema or other signs of acute infarction.  There is no evidence of intracranial mass lesion or mass effect.  No abnormal extra-axial fluid collections are identified.  Ventricles normal in size.  Mild chronic small vessel disease is noted.  No other intracranial abnormality identified.  No skull fracture or other bone abnormality.  IMPRESSION:  1.  No acute intracranial abnormality. 2.  Mild chronic small vessel disease.  Original Report Authenticated By: Danae Orleans, M.D.   Nm Myocar Multi W/spect W/wall Motion / Ef  05/20/2011  *RADIOLOGY REPORT*  Clinical Data:  Chest pain.  Reduced ejection fraction.  History diabetes, coronary artery disease, hypertension.  Prior CABG in 2007.  MYOCARDIAL IMAGING WITH SPECT (REST AND PHARMACOLOGIC-STRESS) GATED LEFT VENTRICULAR WALL MOTION STUDY LEFT VENTRICULAR EJECTION FRACTION  Standard myocardial SPECT imaging was performed after resting intravenous injection of 10. Tc-55m tetrofosmin.  Subsequently, intravenous infusion of Lexiscan  was performed under the supervision of cardiology staff.  At peak effect of the drug, 30. of Tc-68m tetrofosmin was injected intravenously and standard myocardial SPECT imaging was  performed.  Quantitative gated imaging was also performed to evaluate left ventricular wall motion and estimated left ventricular ejection fraction.  Comparison:  Plain film chest of 05/18/2011.  Findings:  Rest images demonstrate amoderate sized fixed defect involving the apex.  This extends into the inferolateral wall including its mid and basilar segments.  Stress images demonstrate no areas of reversibility to suggest inducible ischemia.  Evaluation of wall motion demonstrates global hypokinesis.  No focal wall motion abnormalities identified.  Ejection fraction is estimated at 31%.  End diastolic volume of 122 cc.  End systolic volume of  84 cc.  IMPRESSION:  1.  Large fixed defect involving the apex and inferior lateral wall. 2.  No areas of reversibility to suggest inducible ischemia. 3.  Global hypokinesis, with ejection fraction estimated at 31%.  Original Report Authenticated By: Consuello Bossier, M.D.   Labs: Basic Metabolic Panel:  Lab 05/20/11 1610 05/19/11 1505 05/18/11 2307 05/18/11 1404 05/17/11 1358  NA 141 137 -- 139 141  K 3.3* 3.5 -- -- --  CL 97 93* -- 93* 100  CO2 33* 33* -- 31 30  GLUCOSE 186* 294* -- 306* 171*  BUN 21 16 -- 13 11  CREATININE 1.08  1.21* 1.12* 1.04 0.90  CALCIUM 9.1 9.3 -- 9.6 9.5  MG -- -- -- -- --  PHOS -- -- -- -- --   Liver Function Tests:  Lab 05/17/11 1358  AST 21  ALT 29  ALKPHOS 100  BILITOT 0.5  PROT 7.5  ALBUMIN 3.2*   CBC:  Lab 05/18/11 2307 05/18/11 1404 05/17/11 1358  WBC 8.6 8.9 8.0  NEUTROABS -- -- 5.1  HGB 14.4 15.1* 14.4  HCT 43.2 44.1 43.3  MCV 87.6 86.6 86.3  PLT 215 217 194   Cardiac Enzymes:  Lab 05/19/11 0744 05/18/11 2301 05/18/11 1404 05/17/11 1358  CKTOTAL 206* 236* 254* 356*  CKMB 6.8* 7.5* 8.1* 11.3*  CKMBINDEX -- -- -- --  TROPONINI <0.30 <0.30 <0.30 <0.30   CBG:  Lab 05/22/11 1147 05/22/11 0725 05/21/11 2013 05/21/11 1657 05/21/11 1207  GLUCAP 194* 89 273* 197* 218*    Time coordinating discharge: 25  minutes.  Signed:  Brendia Sacks, MD  [facility] 05/22/2011, 2:29 PM

## 2011-05-22 NOTE — Progress Notes (Signed)
Subjective:   [name] is a 66 year old female with a history of coronary artery disease-status post coronary artery bypass grafting in 2007 page also has a history of hypertension, type 2 diabetes mellitus. She was recently admitted with a stroke. Her most recent assessment of left ventricular systolic function but feels an LV EF of 30%.  She had a stress Myoview on January 13 which revealed a large fixed inferior lateral and apical scar. There is no evidence of ischemia.  She is feeling well.        . aspirin  300 mg Rectal Daily   Or  . aspirin  325 mg Oral Daily  . carvedilol  3.125 mg Oral BID WC  . clopidogrel  75 mg Oral Daily  . DULoxetine  60 mg Oral Daily  . enoxaparin  40 mg Subcutaneous Q24H  . folic acid  1 mg Oral Daily  . furosemide  20 mg Oral BID  . insulin aspart  0-9 Units Subcutaneous TID WC  . insulin glargine  23 Units Subcutaneous QHS  . lisinopril  20 mg Oral Daily  . pantoprazole  20 mg Oral Daily  . potassium chloride  10 mEq Oral BID  . simvastatin  20 mg Oral q1800  . spironolactone  12.5 mg Oral Daily      . sodium chloride      Objective:  Vital Signs in the last 24 hours: Blood pressure 140/91, pulse 75, temperature 97.7 F (36.5 C), temperature source Oral, resp. rate 18, height 5' (1.524 m), weight 146 lb 13.2 oz (66.6 kg), SpO2 96.00%. Temp:  [97.7 F (36.5 C)-97.9 F (36.6 C)] 97.7 F (36.5 C) (01/15 0403) Pulse Rate:  [57-82] 75  (01/15 0748) Resp:  [18-21] 18  (01/15 0403) BP: (102-149)/(66-110) 140/91 mmHg (01/15 0748) SpO2:  [92 %-98 %] 96 % (01/15 0403) Weight:  [146 lb 13.2 oz (66.6 kg)] 146 lb 13.2 oz (66.6 kg) (01/15 0403)  Intake/Output from previous day:   Intake/Output from this shift:    Physical Exam: The patient is alert and oriented x 3.  The mood and affect are normal.   Skin: warm and dry.  Color is normal.    HEENT:   the sclera are nonicteric.  The mucous membranes are moist.  The carotids are 2+  without bruits.  There is no thyromegaly.  There is no JVD.    Lungs: clear.  The chest wall is non tender.    Heart: regular rate with a normal S1 and S2.  There are no murmurs, gallops, or rubs. The PMI is not displaced.     Abdomen: good bowel sounds.  There is no guarding or rebound.  There is no hepatosplenomegaly or tenderness.  There are no masses.   Extremities:  no clubbing, cyanosis, or edema.  The legs are without rashes.  The distal pulses are intact.   Neuro:  Cranial nerves II - XII are intact.  Motor and sensory functions are intact.     Lab Results: No results found for this basename: WBC:2,HGB:2,PLT:2 in the last 72 hours  Basename 05/20/11 1150 05/19/11 1505  NA 141 137  K 3.3* 3.5  CL 97 93*  CO2 33* 33*  GLUCOSE 186* 294*  BUN 21 16  CREATININE 1.08 1.21*   No results found for this basename: TROPONINI:2,CK,MB:2 in the last 72 hours No results found for this basename: BNP in the last 72 hours Hepatic Function Panel No results found for this  basename: PROT,ALBUMIN,AST,ALT,ALKPHOS,BILITOT,BILIDIR,IBILI in the last 72 hours Lab Results  Component Value Date   CHOL 248* 05/19/2011   HDL 34* 05/19/2011   LDLCALC 183* 05/19/2011   TRIG 153* 05/19/2011   CHOLHDL 7.3 05/19/2011   No results found for this basename: INR in the last 72 hours  Tele: NSR.   Assessment/Plan:    CVA (cerebral infarction) (05/18/2011)  The patient has had a stroke in the setting of markedly reduced LV function.  Spoke to Neuro.  Will continue Plavix unless they see evidence that the stroke was due to an embolus - in that case we should consider coumadin given her reduction of LV function.  CAD (coronary artery disease) (05/18/2011)  patient has a history of coronary artery disease and is status post coronary artery bypass grafting. Her Myoview study from yesterday reveals a large scar in the apex and inferolateral wall. There is no evidence of ischemia. I do not think that she needs a  cardiac catheterization at this point. We will review her previous studies.  chronic systolic heart failure (05/19/2011)  patient presents with a stroke. She has markedly reduced left ventricular systolic function. There is no evidence of acute exacerbation of her congestive heart failure. I suspect that this is due to a previous myocardial infarction.  She is on coreg 3.125 BID - will increase to 6.25 BID.  Continue Lisinopril and Lasix  Home soon  Disposition:  Alvia Grove., MD, [facility] 05/22/2011, 8:01 AM LOS: Day 4

## 2011-05-22 NOTE — Progress Notes (Signed)
Stroke Team Progress Note  HISTORY [name] is a 65y/o woman with history of 2 previous strokes who presented on 1/9 with new onset right face, arm, and leg weakness. She was initially seen at [facility] but refused admission. She presented to [facility] ED when her symptoms worsened but was then out of the tPA window. NIHSS was 6 on admission. She was on aspirin and Plavix at home for antiplatelet therapy. A left carotid bruit was noted on her admission exam concerning for left carotid stenosis. CT head did not reveal evidence of any acute abnormality.  SUBJECTIVE Patient admits she was not taking Plavix regularly at home prior to admission.  OBJECTIVE Most recent Vital Signs: Temp: 97.7 F (36.5 C) (01/15 0403) Temp src: Oral (01/15 0403) BP: 140/91 mmHg (01/15 0748) Pulse Rate: 75  (01/15 0748) Respiratory Rate: 18 O2 Saturation: 96%  CBG (last 3)  Basename 05/22/11 0725 05/21/11 2013 05/21/11 1657  GLUCAP 89 273* 197*   Intake/Output from previous day:   IV Fluid Intake:     . sodium chloride     Medications    . aspirin  300 mg Rectal Daily   Or  . aspirin  325 mg Oral Daily  . carvedilol  6.25 mg Oral BID WC  . clopidogrel  75 mg Oral Daily  . DULoxetine  60 mg Oral Daily  . enoxaparin  40 mg Subcutaneous Q24H  . folic acid  1 mg Oral Daily  . furosemide  20 mg Oral BID  . insulin aspart  0-9 Units Subcutaneous TID WC  . insulin glargine  23 Units Subcutaneous QHS  . lisinopril  20 mg Oral Daily  . pantoprazole  20 mg Oral Daily  . potassium chloride  10 mEq Oral BID  . simvastatin  20 mg Oral q1800  . spironolactone  12.5 mg Oral Daily  . DISCONTD: carvedilol  3.125 mg Oral BID WC  PRN albuterol, ALPRAZolam, diphenhydrAMINE, HYDROcodone-acetaminophen, iohexol, nitroGLYCERIN, ondansetron (ZOFRAN) IV, senna-docusate  Diet:  Carb Control thin liquids Activity:  Up with assistance DVT Prophylaxis:  Lovenox 40 mg sq daily   Studies: CBC      Component Value Date/Time   WBC 8.6 05/18/2011 2307   RBC 4.93 05/18/2011 2307   HGB 14.4 05/18/2011 2307   HCT 43.2 05/18/2011 2307   PLT 215 05/18/2011 2307   MCV 87.6 05/18/2011 2307   MCH 29.2 05/18/2011 2307   MCHC 33.3 05/18/2011 2307   RDW 14.9 05/18/2011 2307   LYMPHSABS 2.0 05/17/2011 1358   MONOABS 0.6 05/17/2011 1358   EOSABS 0.2 05/17/2011 1358   BASOSABS 0.0 05/17/2011 1358   CMP    Component Value Date/Time   NA 141 05/20/2011 1150   K 3.3* 05/20/2011 1150   CL 97 05/20/2011 1150   CO2 33* 05/20/2011 1150   GLUCOSE 186* 05/20/2011 1150   BUN 21 05/20/2011 1150   CREATININE 1.08 05/20/2011 1150   CALCIUM 9.1 05/20/2011 1150   PROT 7.5 05/17/2011 1358   ALBUMIN 3.2* 05/17/2011 1358   AST 21 05/17/2011 1358   ALT 29 05/17/2011 1358   ALKPHOS 100 05/17/2011 1358   BILITOT 0.5 05/17/2011 1358   GFRNONAA 53* 05/20/2011 1150   GFRAA 61* 05/20/2011 1150   COAGS Lab Results  Component Value Date   INR 1.07 05/17/2011   INR 2.04* 02/04/2010   INR 2.14* 02/03/2010   Lipid Panel    Component Value Date/Time   CHOL 248* 05/19/2011 0530  TRIG 153* 05/19/2011 0530   HDL 34* 05/19/2011 0530   CHOLHDL 7.3 05/19/2011 0530   VLDL 31 05/19/2011 0530   LDLCALC 183* 05/19/2011 0530   HgbA1C  Lab Results  Component Value Date   HGBA1C 8.9* 05/19/2011   Urine Drug Screen  No results found for this basename: labopia,  cocainscrnur,  labbenz,  amphetmu,  thcu,  labbarb    Alcohol Level No results found for this basename: eth    BNP 1842  Nm Myocar Multi W/spect W/wall Motion / Ef  1.  Large fixed defect involving the apex and inferior lateral wall. 2.  No areas of reversibility to suggest inducible ischemia. 3.  Global hypokinesis, with ejection fraction estimated at 31%.    CT of the brain  Canceled   CT angio  Intracranial calcified atherosclerotic disease involving the distal left vertebral artery and cavernous carotid bilaterally.  No large vessel occlusion  MRI of the brain  Pt cannot  tolerate  MRA of the brain  Pt cannot tolerate  2D Echocardiogram  Left ventricle: LVEF is approximately 25% with diffuse hypokinesis, worse in the inferior, inferoseptal and posterior walls. The cavity size was normal. Wall thickness was normal. Doppler parameters are consistent with abnormal left ventricular relaxation (grade 1 diastolicdysfunction) Aortic valve: AV is thickened, moderately calcified with very mild restricted motion.  Carotid Doppler  There is no signifciant ICA Stenosis. Vertebral artery flow not insonated on the right. Vertebral artery flow is antegrade on the left.   CXR   1.  Cardiomegaly without pulmonary edema. 2.  Improved aeration in the right lung base with persistent mild patchy density.    EKG  unchanged from previous tracings, normal sinus rhythm.   Physical Exam   Present elderly lady currently not in distress.afebrile. Head is non traumatic   Cardiac exam irregular heart sounds. Lungs good auscultation.  Neurological exam : Awake and alert oriented to time place and person. Speech and language appear normal. There is minimal dysarthria. There is mild right lower facial weakness. Tongue is midline. There is no ophthalmoplegia.   There is no upper or lower extremity drift but right grip is weak. The intrinsic hand muscles are weak on the right. She orbits left-to-right approximately. There is 4+ out of 5 weakness in the right upper exudate. Coordination is slow but that all right. There is mild right-sided sensory loss. Plantars are both downgoing. Gait was not tested.  ASSESSMENT [name] is a 66 y.o. female with a likely new left brain stroke in setting of previous stroke in that area, secondary to small vessel disease.  On clopidogrel 75 mg orally every day for secondary stroke prevention (was not taking consistently prior to admission). Coumadin not indicated for her low EF alone.  Stroke risk factors:  diabetes mellitus, hypertension and CAD, EF 25%,  previous stroke  Hospital day # 4  TREATMENT/PLAN Continue clopidogrel 75 mg orally every day for secondary stroke prevention. outpatient PT. Ok for discharge from neuro standpoint. Will sign off. Follow up with Dr. Pearlean Brownie in 2 months.  Joaquin Music, ANP-BC, GNP-BC [facility] Pager: [phone] 05/22/2011 8:25 AM  Dr. Delia Heady, Stroke Center Medical Director, has personally reviewed chart, pertinent data, examined the patient and developed the plan of care.

## 2011-05-22 NOTE — Progress Notes (Signed)
DC orders received.  Discharge, stroke and medication education reviewed with patient.  Patient stable with no S/S distress. Pt DC home. Nolon Nations

## 2011-05-22 NOTE — Progress Notes (Signed)
PROGRESS NOTE  [name]:[id number] DOB: August 29, 1945 DOA: 05/18/2011 PCP: Ancil Boozer, MD, MD Cardiologist: Kristeen Miss, MD  Brief narrative: 66 year old woman presented initially the day prior to admission with chest pain and strokelike symptoms. She left AMA from the emergency department. She presented yesterday into the emergency department with a history of right, face, arm and leg weakness. Passed swallow screen.  Past medical history: Stroke x2, coronary artery disease, CABG, diabetes mellitus  Consultants:  Neurology  Cardiology  Physical therapy: Outpatient physical therapy.  Occupational therapy: Not able to evaluate the patient prior to discharge  Speech therapy: No further needs.  Procedures:  January 12: 2-D echocardiogram: Left ventricle: LVEF is approximately25% with diffuse hypokinesis, worse in the inferior, inferoseptal and posteriorwalls. The cavity size was normal. Wall thickness was normal. Doppler parameters are consistent with abnormal left ventricular relaxation (grade 1 diastolic dysfunction).  January 13: Bilateral carotid Dopplers: No significant ICA stenosis.  January 13: Nuclear medicine myocardial perfusion scan: IMPRESSION: 1. Large fixed defect involving the apex and inferior lateral wall. 2. No areas of reversibility to suggest inducible ischemia. 3. Global hypokinesis, with ejection fraction estimated at 31%.  Antibiotics:  None  Interim History: Interval documentation reviewed. Subjective: Feels great. Ready to go home.  Objective: Filed Vitals:   05/21/11 2000 05/22/11 0000 05/22/11 0403 05/22/11 0748  BP:  131/85 149/81 140/91  Pulse:  78 79 75  Temp: 97.9 F (36.6 C) 97.9 F (36.6 C) 97.7 F (36.5 C)   TempSrc: Oral Oral Oral   Resp: 18 18 18    Height:      Weight:   66.6 kg (146 lb 13.2 oz)   SpO2: 95% 92% 96%    No intake or output data in the 24 hours ending 05/22/11 0907  Exam:  General: Appears calm  and comfortable. Sitting in chair. Cardiovascular: Regular rate and rhythm. No murmur, rub or gallop. Normal respiratory effort. Telemetry: Sinus rhythm with PVCs. Respiratory: Clear to auscultation bilaterally. No wheezes, rales or rhonchi. Normal respiratory effort.  Data Reviewed: Basic Metabolic Panel:  Lab 05/20/11 9562 05/19/11 1505 05/18/11 2307 05/18/11 1404 05/17/11 1358  NA 141 137 -- 139 141  K 3.3* 3.5 -- -- --  CL 97 93* -- 93* 100  CO2 33* 33* -- 31 30  GLUCOSE 186* 294* -- 306* 171*  BUN 21 16 -- 13 11  CREATININE 1.08 1.21* 1.12* 1.04 0.90  CALCIUM 9.1 9.3 -- 9.6 9.5  MG -- -- -- -- --  PHOS -- -- -- -- --   Liver Function Tests:  Lab 05/17/11 1358  AST 21  ALT 29  ALKPHOS 100  BILITOT 0.5  PROT 7.5  ALBUMIN 3.2*   CBC:  Lab 05/18/11 2307 05/18/11 1404 05/17/11 1358  WBC 8.6 8.9 8.0  NEUTROABS -- -- 5.1  HGB 14.4 15.1* 14.4  HCT 43.2 44.1 43.3  MCV 87.6 86.6 86.3  PLT 215 217 194   Cardiac Enzymes:  Lab 05/19/11 0744 05/18/11 2301 05/18/11 1404 05/17/11 1358  CKTOTAL 206* 236* 254* 356*  CKMB 6.8* 7.5* 8.1* 11.3*  CKMBINDEX -- -- -- --  TROPONINI <0.30 <0.30 <0.30 <0.30   CBG:  Lab 05/22/11 0725 05/21/11 2013 05/21/11 1657 05/21/11 1207 05/21/11 0736  GLUCAP 89 273* 197* 218* 125*    Studies: Dg Chest 2 View  05/18/2011  *RADIOLOGY REPORT*  Clinical Data: Left chest pain.  Shortness of breath for several weeks.  General weakness for 6-8 weeks.  History of bypass  surgery, smoking.  History of stroke on 05/16/2011.  CHEST - 2 VIEW  Comparison: 05/17/2011  Findings: Patient has had median sternotomy and CABG.  The heart is enlarged.  There is minimal residual density at the right lung base but there has been improvement in aeration in the right lower lobe. No pleural effusions or new infiltrates.  Degenerative changes are seen in the spine.  IMPRESSION:  1.  Cardiomegaly without pulmonary edema. 2.  Improved aeration in the right lung base with  persistent mild patchy density.  Original Report Authenticated By: Patterson Hammersmith, M.D.   Ct Head Wo Contrast  05/17/2011  *RADIOLOGY REPORT*  Clinical Data: Headache.  Dizziness.  Unable to move right hand. Previous stroke.  CT HEAD WITHOUT CONTRAST  Technique:  Contiguous axial images were obtained from the base of the skull through the vertex without contrast.  Comparison: 03/13/ 2006  Findings: There is no evidence of intracranial hemorrhage, brain edema or other signs of acute infarction.  There is no evidence of intracranial mass lesion or mass effect.  No abnormal extra-axial fluid collections are identified.  Ventricles normal in size.  Mild chronic small vessel disease is noted.  No other intracranial abnormality identified.  No skull fracture or other bone abnormality.  IMPRESSION:  1.  No acute intracranial abnormality. 2.  Mild chronic small vessel disease.  Original Report Authenticated By: Danae Orleans, M.D.    Scheduled Meds:    . aspirin  300 mg Rectal Daily   Or  . aspirin  325 mg Oral Daily  . carvedilol  6.25 mg Oral BID WC  . clopidogrel  75 mg Oral Daily  . DULoxetine  60 mg Oral Daily  . enoxaparin  40 mg Subcutaneous Q24H  . folic acid  1 mg Oral Daily  . furosemide  20 mg Oral BID  . insulin aspart  0-9 Units Subcutaneous TID WC  . insulin glargine  23 Units Subcutaneous QHS  . lisinopril  20 mg Oral Daily  . pantoprazole  20 mg Oral Daily  . potassium chloride  10 mEq Oral BID  . simvastatin  20 mg Oral q1800  . spironolactone  12.5 mg Oral Daily  . DISCONTD: carvedilol  3.125 mg Oral BID WC   Continuous Infusions:    . sodium chloride       Assessment/Plan: 1. Recurrent right acute ischemic cerebral infarction: Plavix. Stop aspirin per neurology. TCD as outpatient. Simvastatin. Cleared for discharge per neurology. 2. Chest pain/elevated CK-MB/total CK: Atypical. Cardiac enzymes negative. Lexiscan noted. No further evaluation at this time per  cardiology. 3. Chronic systolic congestive heart failure: Left ventricular ejection fraction 15-20%. Coreg increased to 6.25 mg by mouth twice a day. Continue lisinopril, Lasix. 4. Hyperlipidemia/hypertriglyceridemia: Zocor. 5. Diabetes mellitus: Stable. Continue Lantus and sliding scale insulin. 6. Cigarette smoker: Recommend cessation  Followup with Dr. Pearlean Brownie 2 months.  Code Status: Full code.  Disposition Plan: Home today.   Brendia Sacks, MD  [facility] Pager [phone] 05/22/2011, 9:07 AM    LOS: 4 days

## 2011-07-24 ENCOUNTER — Ambulatory Visit (INDEPENDENT_AMBULATORY_CARE_PROVIDER_SITE_OTHER): Payer: Medicare Other | Admitting: Cardiovascular Disease

## 2011-07-24 ENCOUNTER — Encounter: Payer: Self-pay | Admitting: Cardiovascular Disease

## 2011-07-24 VITALS — BP 110/71 | HR 64 | Ht 60.0 in | Wt 152.0 lb

## 2011-07-24 DIAGNOSIS — I2510 Atherosclerotic heart disease of native coronary artery without angina pectoris: Secondary | ICD-10-CM

## 2011-07-24 DIAGNOSIS — I509 Heart failure, unspecified: Secondary | ICD-10-CM

## 2011-07-24 DIAGNOSIS — E785 Hyperlipidemia, unspecified: Secondary | ICD-10-CM

## 2011-07-24 DIAGNOSIS — I5022 Chronic systolic (congestive) heart failure: Secondary | ICD-10-CM

## 2011-07-24 DIAGNOSIS — R0602 Shortness of breath: Secondary | ICD-10-CM

## 2011-07-24 LAB — BASIC METABOLIC PANEL
CO2: 30 mEq/L (ref 19–32)
Chloride: 99 mEq/L (ref 96–112)
Potassium: 5.2 mEq/L — ABNORMAL HIGH (ref 3.5–5.1)

## 2011-07-24 LAB — BRAIN NATRIURETIC PEPTIDE: Pro B Natriuretic peptide (BNP): 213 pg/mL — ABNORMAL HIGH (ref 0.0–100.0)

## 2011-07-24 MED ORDER — SPIRONOLACTONE 12.5 MG HALF TABLET: 12.5000 mg | ORAL_TABLET | Freq: Every day | ORAL | Status: DC

## 2011-07-24 MED ORDER — CARVEDILOL 6.25 MG PO TABS: 6.2500 mg | ORAL_TABLET | Freq: Two times a day (BID) | ORAL | Status: DC

## 2011-07-24 NOTE — Assessment & Plan Note (Signed)
.   Has chronic congestive heart failure. She was hospitalized in January and had a stroke. She's noted to have an ejection fraction of 25% at that time. She was started on Aldactone 12.5 mg a day.  She seems to be doing well. She occasionally will take Lasix in addition to her HCTZ which causes a brisk diuresis. I cautioned her about becoming over diuresed since she is now also on Aldactone.  We'll check a basic metabolic profile today as well as a BNP.

## 2011-07-24 NOTE — Progress Notes (Signed)
[name] Date of Birth  February 01, 1946 [facility]     [place] [facility]  [address]    [address]   [address] [place], [place]  [address]    [place], [place]  [address] [phone]  Fax  [phone]  [phone]  Fax [phone]  Problem list: 1. Coronary artery disease-status post CABG 2. Hypertension 3. Hyperlipidemia 4. Diabetes mellitus 5. Arthritis  History of Present Illness:  There is done fairly well since I last saw her. She continues to have an occasional chest pain but it's not very bad. She also is bothered by severe back pain.  She is walking with a cane.  Current Outpatient Prescriptions on File Prior to Visit  Medication Sig Dispense Refill  . albuterol (PROVENTIL HFA;VENTOLIN HFA) 108 (90 BASE) MCG/ACT inhaler Inhale 2 puffs into the lungs every 6 (six) hours as needed.      Marland Kitchen albuterol (PROVENTIL) (5 MG/ML) 0.5% nebulizer solution Take 2.5 mg by nebulization every 6 (six) hours as needed. For shortness of breath      . carvedilol (COREG) 6.25 MG tablet Take 1 tablet (6.25 mg total) by mouth 2 (two) times daily with a meal.  60 tablet  0  . clopidogrel (PLAVIX) 75 MG tablet Take 75 mg by mouth daily.      . DULoxetine (CYMBALTA) 60 MG capsule Take 60 mg by mouth daily.      . folic acid (FOLVITE) 1 MG tablet Take 1 mg by mouth daily.      . furosemide (LASIX) 20 MG tablet Take 1 tablet (20 mg total) by mouth 2 (two) times daily.  60 tablet  0  . insulin glargine (LANTUS) 100 UNIT/ML injection Inject 23 Units into the skin at bedtime.       . insulin lispro (HUMALOG) 100 UNIT/ML injection Inject 6-10 Units into the skin 3 (three) times daily before meals. Sliding scale      . lisinopril (PRINIVIL,ZESTRIL) 20 MG tablet Take 20 mg by mouth daily.      . nitroGLYCERIN (NITROSTAT) 0.4 MG SL tablet Place 1 tablet (0.4 mg total) under the tongue every 5 (five) minutes x 3 doses as needed for chest pain.  90 tablet  0  . pantoprazole (PROTONIX)  20 MG tablet Take 40 mg by mouth daily.       . potassium chloride (K-DUR,KLOR-CON) 10 MEQ tablet Take 10 mEq by mouth daily.       Marland Kitchen spironolactone (ALDACTONE) 12.5 mg TABS Take 0.5 tablets (12.5 mg total) by mouth daily.  30 tablet  0    Allergies  Allergen Reactions  . Codeine   . Penicillins   . Sulfa Antibiotics     Past Medical History  Diagnosis Date  . Stroke   . Coronary artery disease   . Ear infection     right  . Diabetes mellitus   . Hypertension   . 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 Asthma   . Bronchitis   . Reflux     Past Surgical History  Procedure Date  . Coronary artery bypass graft     LIMA to RI, SVG to distal RCA, SVG to LAD, SVG to OM. 2007  . Joint replacement     Right hip replacement   . Debridement skin / sq / muscle of trunk     s/p staph infection from CABG 2007  . Cystectomy     Subcutaneous  History  Smoking status  . Current Everyday Smoker -- 1.0 packs/day for 50 years  . Types: Cigarettes  Smokeless tobacco  . Not on file    History  Alcohol Use No    Family History  Problem Relation Age of Onset  . Coronary artery disease Father 44    Unclear if it was an MI  . Stroke Mother 69  . Coronary artery disease Brother 60  . Coronary artery disease Brother 12    Reviw of Systems:  Reviewed in the HPI.  All other systems are negative.  Physical Exam: Blood pressure 110/71, pulse 64, height 5' (1.524 m), weight 152 lb (68.947 kg). General: Well developed, well nourished, in no acute distress.  Head: Normocephalic, atraumatic, sclera non-icteric, mucus membranes are moist,   Neck: Supple. Carotids are 2 + without bruits. No JVD  Lungs: Clear bilaterally to auscultation.  Heart: regular rate.  normal  S1 S2. No murmurs, gallops or rubs.  Abdomen: Soft, non-tender, non-distended with normal bowel sounds. No hepatomegaly. No rebound/guarding. No masses.  Msk:  Strength and tone are normal  Extremities: No  clubbing or cyanosis. No edema.  Distal pedal pulses are 2+ and equal bilaterally.  Neuro: Alert and oriented X 3. Moves all extremities spontaneously.  Psych:  Responds to questions appropriately with a normal affect.  ECG:  Assessment / Plan:

## 2011-07-24 NOTE — Patient Instructions (Addendum)
Your physician recommends that you return for lab work in: TODAY AND IN 3 MONTHS TODAY BMP/BMET  Your physician recommends that you return for a FASTING lipid profile: 3 MONTHS   Your physician recommends that you schedule a follow-up appointment in: 3 MONTHS

## 2011-07-24 NOTE — Assessment & Plan Note (Signed)
Pt is doing well.  She is not having any significant episodes of angina.  Continue current meds.

## 2011-07-25 ENCOUNTER — Telehealth: Payer: Self-pay | Admitting: *Deleted

## 2011-07-25 NOTE — Telephone Encounter (Signed)
Patient called with lab results. K+ stopped/ lab redraw scheduled. Pt verbalized understanding. Alfonso Ramus RN

## 2011-07-25 NOTE — Telephone Encounter (Signed)
Message copied by Antony Odea on Wed Jul 25, 2011  1:41 PM ------      Message from: Vesta Mixer      Created: Tue Jul 24, 2011  6:08 PM       Her potassium levels are fairly high. She is on a potassium supplement as well as low-dose Aldactone. I think it we can stop the potassium supplement. We will recheck her basic metabolic profile in several weeks.

## 2011-08-02 ENCOUNTER — Other Ambulatory Visit: Payer: Self-pay | Admitting: Specialist

## 2011-08-02 DIAGNOSIS — M79605 Pain in left leg: Secondary | ICD-10-CM

## 2011-08-02 DIAGNOSIS — M4800 Spinal stenosis, site unspecified: Secondary | ICD-10-CM

## 2011-08-06 ENCOUNTER — Inpatient Hospital Stay: Admission: RE | Admit: 2011-08-06 | Payer: Medicare Other | Source: Ambulatory Visit

## 2011-08-14 ENCOUNTER — Telehealth: Payer: Self-pay | Admitting: Cardiovascular Disease

## 2011-08-14 ENCOUNTER — Other Ambulatory Visit: Payer: Medicare Other

## 2011-08-14 ENCOUNTER — Other Ambulatory Visit (INDEPENDENT_AMBULATORY_CARE_PROVIDER_SITE_OTHER): Payer: Medicare Other

## 2011-08-14 DIAGNOSIS — E785 Hyperlipidemia, unspecified: Secondary | ICD-10-CM

## 2011-08-14 LAB — BASIC METABOLIC PANEL
CO2: 28 mEq/L (ref 19–32)
Chloride: 98 mEq/L (ref 96–112)
Glucose, Bld: 149 mg/dL — ABNORMAL HIGH (ref 70–99)
Potassium: 4.5 mEq/L (ref 3.5–5.1)
Sodium: 137 mEq/L (ref 135–145)

## 2011-08-14 MED ORDER — FUROSEMIDE 20 MG PO TABS: 20.0000 mg | ORAL_TABLET | Freq: Two times a day (BID) | ORAL | Status: DC

## 2011-08-14 NOTE — Telephone Encounter (Signed)
Pt able to come in for labs, lasix refilled for one month till labs are seen

## 2011-08-14 NOTE — Telephone Encounter (Signed)
New Problem:     Patient was concerned about paying $50 for changing her Lab appointment today. Please call back.

## 2011-08-15 ENCOUNTER — Other Ambulatory Visit: Payer: Medicare Other

## 2011-08-15 ENCOUNTER — Telehealth: Payer: Self-pay | Admitting: *Deleted

## 2011-08-15 NOTE — Telephone Encounter (Signed)
Error, in clicking  phone call.

## 2011-09-08 ENCOUNTER — Ambulatory Visit
Admission: RE | Admit: 2011-09-08 | Discharge: 2011-09-08 | Disposition: A | Discharge status: Home / Self Care | Payer: Medicare Other | Source: Ambulatory Visit | Attending: Specialist | Admitting: Specialist

## 2011-09-08 DIAGNOSIS — M4800 Spinal stenosis, site unspecified: Secondary | ICD-10-CM

## 2011-09-08 DIAGNOSIS — M79605 Pain in left leg: Secondary | ICD-10-CM

## 2011-09-14 ENCOUNTER — Ambulatory Visit
Admission: RE | Admit: 2011-09-14 | Discharge: 2011-09-14 | Disposition: A | Discharge status: Home / Self Care | Payer: Medicare Other | Source: Ambulatory Visit | Attending: Specialist | Admitting: Specialist

## 2011-09-25 ENCOUNTER — Encounter: Payer: Self-pay | Admitting: *Deleted

## 2011-10-15 ENCOUNTER — Encounter: Payer: Self-pay | Admitting: Physical Medicine & Rehabilitation

## 2011-10-17 ENCOUNTER — Ambulatory Visit (HOSPITAL_COMMUNITY)
Admission: RE | Admit: 2011-10-17 | Discharge: 2011-10-17 | Disposition: A | Discharge status: Home / Self Care | Payer: Medicare Other | Source: Ambulatory Visit | Attending: Family Medicine | Admitting: Family Medicine

## 2011-10-17 ENCOUNTER — Other Ambulatory Visit: Payer: Self-pay | Admitting: Family Medicine

## 2011-10-17 DIAGNOSIS — M7989 Other specified soft tissue disorders: Secondary | ICD-10-CM

## 2011-10-17 DIAGNOSIS — R52 Pain, unspecified: Secondary | ICD-10-CM

## 2011-10-17 DIAGNOSIS — M79609 Pain in unspecified limb: Secondary | ICD-10-CM

## 2011-10-20 ENCOUNTER — Ambulatory Visit (INDEPENDENT_AMBULATORY_CARE_PROVIDER_SITE_OTHER): Payer: Medicare Other | Admitting: Family Medicine

## 2011-10-20 ENCOUNTER — Emergency Department (HOSPITAL_COMMUNITY): Payer: Medicare Other

## 2011-10-20 ENCOUNTER — Encounter (HOSPITAL_COMMUNITY): Payer: Self-pay | Admitting: Emergency Medicine

## 2011-10-20 ENCOUNTER — Encounter: Payer: Self-pay | Admitting: Family Medicine

## 2011-10-20 ENCOUNTER — Inpatient Hospital Stay (HOSPITAL_COMMUNITY)
Admission: EM | Admit: 2011-10-20 | Discharge: 2011-10-24 | DRG: 291 | Disposition: A | Discharge status: Home / Self Care | Payer: Medicare Other | Attending: Cardiovascular Disease | Admitting: Cardiovascular Disease

## 2011-10-20 VITALS — BP 167/96 | HR 83 | Temp 97.9°F | Resp 18

## 2011-10-20 DIAGNOSIS — J9600 Acute respiratory failure, unspecified whether with hypoxia or hypercapnia: Secondary | ICD-10-CM | POA: Diagnosis present

## 2011-10-20 DIAGNOSIS — E113299 Type 2 diabetes mellitus with mild nonproliferative diabetic retinopathy without macular edema, unspecified eye: Secondary | ICD-10-CM

## 2011-10-20 DIAGNOSIS — Z951 Presence of aortocoronary bypass graft: Secondary | ICD-10-CM

## 2011-10-20 DIAGNOSIS — E785 Hyperlipidemia, unspecified: Secondary | ICD-10-CM | POA: Diagnosis present

## 2011-10-20 DIAGNOSIS — I5043 Acute on chronic combined systolic (congestive) and diastolic (congestive) heart failure: Principal | ICD-10-CM | POA: Diagnosis present

## 2011-10-20 DIAGNOSIS — E1139 Type 2 diabetes mellitus with other diabetic ophthalmic complication: Secondary | ICD-10-CM | POA: Diagnosis present

## 2011-10-20 DIAGNOSIS — I5023 Acute on chronic systolic (congestive) heart failure: Secondary | ICD-10-CM

## 2011-10-20 DIAGNOSIS — IMO0002 Reserved for concepts with insufficient information to code with codable children: Secondary | ICD-10-CM

## 2011-10-20 DIAGNOSIS — R0989 Other specified symptoms and signs involving the circulatory and respiratory systems: Secondary | ICD-10-CM

## 2011-10-20 DIAGNOSIS — J449 Chronic obstructive pulmonary disease, unspecified: Secondary | ICD-10-CM | POA: Diagnosis present

## 2011-10-20 DIAGNOSIS — Z720 Tobacco use: Secondary | ICD-10-CM | POA: Diagnosis present

## 2011-10-20 DIAGNOSIS — I2510 Atherosclerotic heart disease of native coronary artery without angina pectoris: Secondary | ICD-10-CM | POA: Diagnosis present

## 2011-10-20 DIAGNOSIS — Z8673 Personal history of transient ischemic attack (TIA), and cerebral infarction without residual deficits: Secondary | ICD-10-CM

## 2011-10-20 DIAGNOSIS — E118 Type 2 diabetes mellitus with unspecified complications: Secondary | ICD-10-CM

## 2011-10-20 DIAGNOSIS — J4489 Other specified chronic obstructive pulmonary disease: Secondary | ICD-10-CM | POA: Diagnosis present

## 2011-10-20 DIAGNOSIS — I252 Old myocardial infarction: Secondary | ICD-10-CM

## 2011-10-20 DIAGNOSIS — R079 Chest pain, unspecified: Secondary | ICD-10-CM

## 2011-10-20 DIAGNOSIS — E1165 Type 2 diabetes mellitus with hyperglycemia: Secondary | ICD-10-CM

## 2011-10-20 DIAGNOSIS — E11319 Type 2 diabetes mellitus with unspecified diabetic retinopathy without macular edema: Secondary | ICD-10-CM | POA: Diagnosis present

## 2011-10-20 DIAGNOSIS — I10 Essential (primary) hypertension: Secondary | ICD-10-CM

## 2011-10-20 DIAGNOSIS — F17200 Nicotine dependence, unspecified, uncomplicated: Secondary | ICD-10-CM | POA: Diagnosis present

## 2011-10-20 DIAGNOSIS — R0609 Other forms of dyspnea: Secondary | ICD-10-CM

## 2011-10-20 DIAGNOSIS — I509 Heart failure, unspecified: Secondary | ICD-10-CM | POA: Diagnosis present

## 2011-10-20 DIAGNOSIS — Z794 Long term (current) use of insulin: Secondary | ICD-10-CM

## 2011-10-20 DIAGNOSIS — I5022 Chronic systolic (congestive) heart failure: Secondary | ICD-10-CM

## 2011-10-20 DIAGNOSIS — Z8249 Family history of ischemic heart disease and other diseases of the circulatory system: Secondary | ICD-10-CM

## 2011-10-20 DIAGNOSIS — R0602 Shortness of breath: Secondary | ICD-10-CM

## 2011-10-20 DIAGNOSIS — I25810 Atherosclerosis of coronary artery bypass graft(s) without angina pectoris: Secondary | ICD-10-CM

## 2011-10-20 DIAGNOSIS — R0600 Dyspnea, unspecified: Secondary | ICD-10-CM

## 2011-10-20 HISTORY — DX: Unspecified osteoarthritis, unspecified site: M19.90

## 2011-10-20 HISTORY — DX: Gastro-esophageal reflux disease without esophagitis: K21.9

## 2011-10-20 HISTORY — DX: Pneumonia, unspecified organism: J18.9

## 2011-10-20 HISTORY — DX: Heart failure, unspecified: I50.9

## 2011-10-20 HISTORY — DX: Hyperlipidemia, unspecified: E78.5

## 2011-10-20 LAB — GLUCOSE, CAPILLARY: Glucose-Capillary: 219 mg/dL — ABNORMAL HIGH (ref 70–99)

## 2011-10-20 LAB — PRO B NATRIURETIC PEPTIDE: Pro B Natriuretic peptide (BNP): 6469 pg/mL — ABNORMAL HIGH (ref 0–125)

## 2011-10-20 LAB — CBC
HCT: 44.1 % (ref 36.0–46.0)
Hemoglobin: 15.3 g/dL — ABNORMAL HIGH (ref 12.0–15.0)
MCH: 30.4 pg (ref 26.0–34.0)
MCHC: 34.7 g/dL (ref 30.0–36.0)
MCV: 87.5 fL (ref 78.0–100.0)
RBC: 5.04 MIL/uL (ref 3.87–5.11)

## 2011-10-20 LAB — POCT I-STAT, CHEM 8
Calcium, Ion: 1.17 mmol/L (ref 1.12–1.32)
Chloride: 99 mEq/L (ref 96–112)
Glucose, Bld: 163 mg/dL — ABNORMAL HIGH (ref 70–99)
HCT: 45 % (ref 36.0–46.0)
TCO2: 29 mmol/L (ref 0–100)

## 2011-10-20 MED ORDER — ATORVASTATIN CALCIUM 80 MG PO TABS
80.0000 mg | ORAL_TABLET | Freq: Every day | ORAL | Status: DC
  Administered 2011-10-22 – 2011-10-24 (×3): 80 mg via ORAL
  Filled 2011-10-20 (×4): qty 1

## 2011-10-20 MED ORDER — LISINOPRIL 20 MG PO TABS
20.0000 mg | ORAL_TABLET | Freq: Two times a day (BID) | ORAL | Status: DC
  Administered 2011-10-20 – 2011-10-24 (×8): 20 mg via ORAL
  Filled 2011-10-20 (×9): qty 1

## 2011-10-20 MED ORDER — SODIUM CHLORIDE 0.9 % IJ SOLN: 3.0000 mL | INTRAMUSCULAR | Status: DC | PRN

## 2011-10-20 MED ORDER — INSULIN GLARGINE 100 UNIT/ML ~~LOC~~ SOLN
15.0000 [IU] | SUBCUTANEOUS | Status: DC
  Administered 2011-10-21 – 2011-10-24 (×4): 15 [IU] via SUBCUTANEOUS

## 2011-10-20 MED ORDER — INSULIN ASPART 100 UNIT/ML ~~LOC~~ SOLN: 23.0000 [IU] | Freq: Once | SUBCUTANEOUS | Status: DC

## 2011-10-20 MED ORDER — ALBUTEROL SULFATE HFA 108 (90 BASE) MCG/ACT IN AERS
2.0000 | INHALATION_SPRAY | Freq: Four times a day (QID) | RESPIRATORY_TRACT | Status: DC | PRN
  Filled 2011-10-20: qty 6.7

## 2011-10-20 MED ORDER — CARVEDILOL 12.5 MG PO TABS
12.5000 mg | ORAL_TABLET | Freq: Two times a day (BID) | ORAL | Status: DC
  Administered 2011-10-20 – 2011-10-24 (×8): 12.5 mg via ORAL
  Filled 2011-10-20 (×9): qty 1

## 2011-10-20 MED ORDER — BUDESONIDE-FORMOTEROL FUMARATE 160-4.5 MCG/ACT IN AERO
2.0000 | INHALATION_SPRAY | Freq: Two times a day (BID) | RESPIRATORY_TRACT | Status: DC
  Administered 2011-10-21 – 2011-10-23 (×6): 2 via RESPIRATORY_TRACT
  Filled 2011-10-20: qty 6

## 2011-10-20 MED ORDER — SPIRONOLACTONE 12.5 MG HALF TABLET
12.5000 mg | ORAL_TABLET | Freq: Every day | ORAL | Status: DC
  Administered 2011-10-20 – 2011-10-24 (×5): 12.5 mg via ORAL
  Filled 2011-10-20 (×5): qty 1

## 2011-10-20 MED ORDER — CLOPIDOGREL BISULFATE 75 MG PO TABS
75.0000 mg | ORAL_TABLET | Freq: Every day | ORAL | Status: DC
  Administered 2011-10-20 – 2011-10-24 (×5): 75 mg via ORAL
  Filled 2011-10-20 (×5): qty 1

## 2011-10-20 MED ORDER — ALPRAZOLAM 0.5 MG PO TABS: 0.5000 mg | ORAL_TABLET | Freq: Every evening | ORAL | Status: DC | PRN

## 2011-10-20 MED ORDER — ENOXAPARIN SODIUM 40 MG/0.4ML ~~LOC~~ SOLN
40.0000 mg | SUBCUTANEOUS | Status: DC
  Administered 2011-10-20 – 2011-10-23 (×4): 40 mg via SUBCUTANEOUS
  Filled 2011-10-20 (×5): qty 0.4

## 2011-10-20 MED ORDER — DULOXETINE HCL 60 MG PO CPEP
60.0000 mg | ORAL_CAPSULE | Freq: Every day | ORAL | Status: DC
  Administered 2011-10-20 – 2011-10-24 (×5): 60 mg via ORAL
  Filled 2011-10-20 (×5): qty 1

## 2011-10-20 MED ORDER — PREGABALIN 25 MG PO CAPS
50.0000 mg | ORAL_CAPSULE | Freq: Two times a day (BID) | ORAL | Status: DC
  Administered 2011-10-20 – 2011-10-24 (×8): 50 mg via ORAL
  Filled 2011-10-20 (×8): qty 2

## 2011-10-20 MED ORDER — FOLIC ACID 1 MG PO TABS
1.0000 mg | ORAL_TABLET | Freq: Every day | ORAL | Status: DC
  Administered 2011-10-20 – 2011-10-24 (×5): 1 mg via ORAL
  Filled 2011-10-20 (×6): qty 1

## 2011-10-20 MED ORDER — ACETAMINOPHEN 325 MG PO TABS
650.0000 mg | ORAL_TABLET | ORAL | Status: DC | PRN
  Filled 2011-10-20: qty 2

## 2011-10-20 MED ORDER — PANTOPRAZOLE SODIUM 40 MG PO TBEC
40.0000 mg | DELAYED_RELEASE_TABLET | Freq: Every day | ORAL | Status: DC
  Administered 2011-10-20 – 2011-10-24 (×5): 40 mg via ORAL
  Filled 2011-10-20 (×5): qty 1

## 2011-10-20 MED ORDER — SODIUM CHLORIDE 0.9 % IJ SOLN
3.0000 mL | Freq: Two times a day (BID) | INTRAMUSCULAR | Status: DC
  Administered 2011-10-20 – 2011-10-24 (×8): 3 mL via INTRAVENOUS

## 2011-10-20 MED ORDER — FUROSEMIDE 10 MG/ML IJ SOLN
80.0000 mg | Freq: Every day | INTRAMUSCULAR | Status: DC
  Administered 2011-10-20 – 2011-10-21 (×2): 80 mg via INTRAVENOUS
  Filled 2011-10-20 (×3): qty 8

## 2011-10-20 MED ORDER — NITROGLYCERIN 0.4 MG SL SUBL: 0.4000 mg | SUBLINGUAL_TABLET | SUBLINGUAL | Status: DC | PRN

## 2011-10-20 MED ORDER — ONDANSETRON HCL 4 MG/2ML IJ SOLN: 4.0000 mg | Freq: Four times a day (QID) | INTRAMUSCULAR | Status: DC | PRN

## 2011-10-20 MED ORDER — SODIUM CHLORIDE 0.9 % IV SOLN: 250.0000 mL | INTRAVENOUS | Status: DC | PRN

## 2011-10-20 NOTE — Progress Notes (Signed)
Subjective: 66 year old lady with a history of having had increasing chest tightness and pain over the past week. She's been short of breath. She drove herself here, thinking that she was in congestive heart failure. She had an episode of congestive heart fair back in January of this year. She has had CABG for 5 years ago. She's been having swelling in her left leg and had a Doppler the other day to make sure she didn't have a clot. She drove himself here for evaluation. She's quite short of breath. Still has a heavy hurting in her chest.  Reviewed her old chart on Epic as well as the sheet that she brought in from her primary care.  Objective: Quite dyspneic when walking in. Blood pressure is elevated. Neck supple no obvious JVD. Chest is clear. Heart regular. Soft systolic murmur. Abdomen soft without masses or tenderness. Minimal ankle edema.  EKG had very poor R-wave progression and nonspecific ST-T wave segment changes. These were similar to her last one available on Epic which was from January.  Assessment: Chest pain Dyspnea Coronary artery disease Diabetes Hypertension  Plan: Send patient to go to emergency room by EMS. Begin IV, O2 check glucose to Her cardiologist is Dr.Nahser.  We need to rule out pulmonary emboli, acute coronary syndrome, and congestive failure related symptoms.  Discussed with the emergency crew who are taking her.    Results for orders placed in visit on 10/20/11  GLUCOSE, POCT (MANUAL RESULT ENTRY)      Component Value Range   POC Glucose 187 (*) 70 - 99 mg/dl     Called and spoke to the charge nurse in the emergency room echo in hospital.

## 2011-10-20 NOTE — ED Provider Notes (Signed)
History     CSN: [id number]  Arrival date & time 10/20/11  1452   First MD Initiated Contact with Patient 10/20/11 1501      Chief Complaint  Patient presents with  . Chest Pain  . Shortness of Breath     HPI Per EMS- Pt was at [facility] when they called EMS. Pt c/o chest pain and sob. Pt alert and oriented X 4. Urgent care started IV in L forearm 20G. EMS gave nitro X 1 on way to ED, pt reports some pain relief after receiving nitro. Pt reports unable to take ASA d/t being on plavix. Pt on 2L/min of 02 with nasal canula  Past Medical History  Diagnosis Date  . Stroke   . Coronary artery disease   . Ear infection     right  . Diabetes mellitus   . Hypertension   . 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 Asthma   . Bronchitis   . Reflux   . Hyperlipidemia   . Pneumonia   . CHF (congestive heart failure)   . GERD (gastroesophageal reflux disease)   . Arthritis     Past Surgical History  Procedure Date  . Coronary artery bypass graft     LIMA to RI, SVG to distal RCA, SVG to LAD, SVG to OM. 2007  . Joint replacement     Right hip replacement   . Debridement skin / sq / muscle of trunk     s/p staph infection from CABG 2007  . Cystectomy     Subcutaneous  . Partial hip arthroplasty   . Stroke     Family History  Problem Relation Age of Onset  . Coronary artery disease Father 5    Unclear if it was an MI  . Stroke Mother 54  . Coronary artery disease Brother 34  . Coronary artery disease Brother 59    History  Substance Use Topics  . Smoking status: Current Everyday Smoker -- 0.8 packs/day for 50 years    Types: Cigarettes  . Smokeless tobacco: Never Used  . Alcohol Use: No    OB History    Grav Para Term Preterm Abortions TAB SAB Ect Mult Living                  Review of Systems  All other systems reviewed and are negative.    Allergies  Codeine; Ivp dye; Penicillins; and Sulfa antibiotics  Home Medications   No current  outpatient prescriptions on file.  BP 150/83  Pulse 67  Temp 97.9 F (36.6 C) (Oral)  Resp 18  Ht 5' (1.524 m)  Wt 146 lb 11.2 oz (66.543 kg)  BMI 28.65 kg/m2  SpO2 91%  Physical Exam  Nursing note and vitals reviewed. Constitutional: She is oriented to person, place, and time. She appears well-developed and well-nourished. No distress.  HENT:  Head: Normocephalic and atraumatic.  Eyes: Pupils are equal, round, and reactive to light.  Neck: Normal range of motion.  Cardiovascular: Normal rate and intact distal pulses.        SINUS RHYTHM  Rate = 83 LAA, CONSIDER BIATRIAL ABNORMALITIES ~  PROBABLE LEFT VENTRICULARHYPERTROPHY  BORDERLINE T ABNORMALITIES, INFERIOR LEADS ~ T flat/neg, II III aVF  Pulmonary/Chest: She has rales.  Abdominal: Normal appearance. She exhibits no distension.  Musculoskeletal: Normal range of motion.  Neurological: She is alert and oriented to person, place, and time. No cranial nerve deficit.  Skin: Skin  is warm and dry. No rash noted.  Psychiatric: She has a normal mood and affect. Her behavior is normal.    ED Course  Procedures (including critical care time)  Labs Reviewed  PRO B NATRIURETIC PEPTIDE - Abnormal; Notable for the following:    Pro B Natriuretic peptide (BNP) 6469.0 (*)     All other components within normal limits  POCT I-STAT, CHEM 8 - Abnormal; Notable for the following:    Glucose, Bld 163 (*)     Hemoglobin 15.3 (*)     All other components within normal limits  BASIC METABOLIC PANEL - Abnormal; Notable for the following:    Glucose, Bld 230 (*)     Creatinine, Ser 1.20 (*)     GFR calc non Af Amer 46 (*)     GFR calc Af Amer 54 (*)     All other components within normal limits  CBC - Abnormal; Notable for the following:    WBC 10.7 (*)     Hemoglobin 15.3 (*)     All other components within normal limits  CREATININE, SERUM - Abnormal; Notable for the following:    GFR calc non Af Amer 55 (*)     GFR calc Af Amer 63  (*)     All other components within normal limits  GLUCOSE, CAPILLARY - Abnormal; Notable for the following:    Glucose-Capillary 219 (*)     All other components within normal limits  GLUCOSE, CAPILLARY - Abnormal; Notable for the following:    Glucose-Capillary 220 (*)     All other components within normal limits  GLUCOSE, CAPILLARY - Abnormal; Notable for the following:    Glucose-Capillary 190 (*)     All other components within normal limits  GLUCOSE, CAPILLARY - Abnormal; Notable for the following:    Glucose-Capillary 285 (*)     All other components within normal limits  BASIC METABOLIC PANEL - Abnormal; Notable for the following:    Glucose, Bld 182 (*)     Creatinine, Ser 1.21 (*)     GFR calc non Af Amer 46 (*)     GFR calc Af Amer 53 (*)     All other components within normal limits  GLUCOSE, CAPILLARY - Abnormal; Notable for the following:    Glucose-Capillary 167 (*)     All other components within normal limits  CBC - Abnormal; Notable for the following:    RBC 5.13 (*)     Hemoglobin 15.3 (*)     All other components within normal limits  CARDIAC PANEL(CRET KIN+CKTOT+MB+TROPI) - Abnormal; Notable for the following:    CK, MB 4.6 (*)     Relative Index 4.0 (*)     All other components within normal limits  CARDIAC PANEL(CRET KIN+CKTOT+MB+TROPI) - Abnormal; Notable for the following:    CK, MB 4.6 (*)     Relative Index 4.2 (*)     All other components within normal limits  GLUCOSE, CAPILLARY - Abnormal; Notable for the following:    Glucose-Capillary 196 (*)     All other components within normal limits  GLUCOSE, CAPILLARY - Abnormal; Notable for the following:    Glucose-Capillary 348 (*)     All other components within normal limits  BASIC METABOLIC PANEL - Abnormal; Notable for the following:    Chloride 95 (*)     Glucose, Bld 189 (*)     BUN 27 (*)     Creatinine, Ser 1.18 (*)  GFR calc non Af Amer 47 (*)     GFR calc Af Amer 55 (*)     All  other components within normal limits  GLUCOSE, CAPILLARY - Abnormal; Notable for the following:    Glucose-Capillary 183 (*)     All other components within normal limits  GLUCOSE, CAPILLARY - Abnormal; Notable for the following:    Glucose-Capillary 183 (*)     All other components within normal limits  TROPONIN I  GLUCOSE, CAPILLARY   No results found.   1. Chronic systolic congestive heart failure   2. Acute on chronic systolic heart failure   3. CAD (coronary artery disease)   4. Chest pain       MDM         Nelia Shi, MD 10/23/11 [phone]

## 2011-10-20 NOTE — ED Notes (Signed)
Per EMS- Pt was at [facility] when they called EMS. Pt c/o chest pain and sob. Pt alert and oriented X 4. Urgent care started IV in L forearm 20G. EMS gave nitro X 1 on way to ED, pt reports some pain relief after receiving nitro. Pt reports unable to take ASA d/t being on plavix. Pt on 2L/min of 02 with nasal canula.

## 2011-10-20 NOTE — ED Notes (Signed)
Cardiology here to see pt.

## 2011-10-20 NOTE — H&P (Signed)
[name] is an 66 y.o. female.    Chief Complaint: Shortness of breath for 1 week  HPI: 66 y/o female with PMH of HTN, IDDM, CAD s/p CABG 2007 (LIMA-->RI, SVG-->dRCA, SVG-->LAD, SVG--OM), severe chronic systolic heart failure (LVEF 25% by TTE from 05/19/2011) presenting with a 1 week history of worsening shortness of breath. Her symptoms occurs at rest and with minimal exertion and makes her feel like she has a "heavy feeling" in her chest.  It is associated with 4 pillow orthopnea (she normally uses 2 pillows) and PND.  When she noted that she began to have bilateral lower extremity edema, she increased her Lasix from 40 mg to 80 mg q daily.  She noticed a decrease in her lower extremity edema, but she did not have any significant change in her symptoms of shortness of breath.  She subsequently presented to an urgent care where she was referred to ER.  Currently, she denies any chest pain, nausea or vomiting, palpitation or syncope, but she still has shortness of breath.  She reports compliance with her medication, and there has not been any recent change in her medication.  In addition, she denies any dietary indiscretions.  She continues to be active tobacco smoker. Her BNP today is 6469.  Past Medical History  Diagnosis Date  . Stroke   . Coronary artery disease   . Ear infection     right  . Diabetes mellitus   . Hypertension   . 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 Asthma   . Bronchitis   . Reflux     Past Surgical History  Procedure Date  . Coronary artery bypass graft     LIMA to RI, SVG to distal RCA, SVG to LAD, SVG to OM. 2007  . Joint replacement     Right hip replacement   . Debridement skin / sq / muscle of trunk     s/p staph infection from CABG 2007  . Cystectomy     Subcutaneous  . Partial hip arthroplasty   . Stroke     Family History  Problem Relation Age of Onset  . Coronary artery disease Father 85    Unclear if it was an MI  . Stroke Mother 87   . Coronary artery disease Brother 64  . Coronary artery disease Brother 18   Social History:  reports that she has been smoking Cigarettes.  She has a 50 pack-year smoking history. She does not have any smokeless tobacco history on file. She reports that she does not drink alcohol or use illicit drugs.  Allergies:  Allergies  Allergen Reactions  . Codeine   . Ivp Dye (Iodinated Diagnostic Agents)   . Penicillins   . Sulfa Antibiotics      (Not in a hospital admission)  Results for orders placed during the hospital encounter of 10/20/11 (from the past 48 hour(s))  PRO B NATRIURETIC PEPTIDE     Status: Abnormal   Collection Time   10/20/11  3:38 PM      Component Value Range Comment   Pro B Natriuretic peptide (BNP) 6469.0 (*) 0 - 125 pg/mL   TROPONIN I     Status: Normal   Collection Time   10/20/11  3:38 PM      Component Value Range Comment   Troponin I <0.30  <0.30 ng/mL   POCT I-STAT, CHEM 8     Status: Abnormal   Collection Time   10/20/11  4:04 PM      Component Value Range Comment   Sodium 140  135 - 145 mEq/L    Potassium 4.4  3.5 - 5.1 mEq/L    Chloride 99  96 - 112 mEq/L    BUN 12  6 - 23 mg/dL    Creatinine, Ser 1.19  0.50 - 1.10 mg/dL    Glucose, Bld 147 (*) 70 - 99 mg/dL    Calcium, Ion 8.29  5.62 - 1.32 mmol/L    TCO2 29  0 - 100 mmol/L    Hemoglobin 15.3 (*) 12.0 - 15.0 g/dL    HCT 13.0  86.5 - 78.4 %    Dg Chest 2 View  10/20/2011  *RADIOLOGY REPORT*  Clinical Data: Chest pain, shortness of breath, history of chronic bronchitis  CHEST - 2 VIEW  Comparison: 05/18/2011; 05/17/2011  Findings: Grossly unchanged enlarged cardiac silhouette and mediastinal contours post median sternotomy and CABG.  The lungs remain hyperexpanded with flattening of bilateral hemidiaphragms. There is grossly unchanged diffuse thickening of the pulmonary interstitium with right basilar atelectasis/scar. No new focal airspace opacities.  Grossly unchanged scoliotic curvature of the  thoracolumbar spine.  Calcifications within the abdominal aorta.  IMPRESSION: Chronic bronchitic change without definite acute cardiopulmonary disease.  Original Report Authenticated By: Waynard Reeds, M.D.    Review of Systems  Constitutional: Negative for fever, chills, weight loss, malaise/fatigue and diaphoresis.  HENT: Negative for hearing loss, ear pain, nosebleeds, congestion, sore throat, neck pain, tinnitus and ear discharge.   Eyes: Negative for blurred vision, double vision, photophobia, pain, discharge and redness.  Respiratory: Positive for shortness of breath. Negative for cough, hemoptysis, sputum production, wheezing and stridor.   Cardiovascular: Positive for chest pain, orthopnea, leg swelling and PND. Negative for palpitations and claudication.  Gastrointestinal: Negative for heartburn, nausea, vomiting, abdominal pain, diarrhea, constipation, blood in stool and melena.  Genitourinary: Negative for dysuria, urgency, frequency, hematuria and flank pain.  Musculoskeletal: Positive for joint pain. Negative for myalgias, back pain and falls.  Skin: Negative for itching and rash.  Neurological: Negative for dizziness, tingling, tremors, sensory change, speech change, focal weakness, loss of consciousness, weakness and headaches.  Endo/Heme/Allergies: Negative for environmental allergies and polydipsia. Does not bruise/bleed easily.  Psychiatric/Behavioral: Negative for suicidal ideas and hallucinations.    Blood pressure 157/91, pulse 91, temperature 98.2 F (36.8 C), temperature source Oral, resp. rate 17, SpO2 99.00%. Physical Exam  Constitutional: She is oriented to person, place, and time. She appears well-developed and well-nourished. No distress.  HENT:  Head: Normocephalic.  Eyes: EOM are normal. Pupils are equal, round, and reactive to light. Right eye exhibits no discharge. Left eye exhibits no discharge. No scleral icterus.  Neck: Normal range of motion. Neck  supple. JVD present. No tracheal deviation present. No thyromegaly present.  Cardiovascular: Normal rate and regular rhythm.  Exam reveals friction rub. Exam reveals no gallop.   No murmur heard. Respiratory: No stridor. No respiratory distress. She has no wheezes. She has rales. She exhibits no tenderness.  GI: She exhibits no distension and no mass. There is no tenderness. There is no rebound and no guarding.  Musculoskeletal: She exhibits no edema and no tenderness.  Neurological: She is alert and oriented to person, place, and time. No cranial nerve deficit. Coordination normal.  Skin: No rash noted. She is not diaphoretic. No erythema.  Psychiatric: She has a normal mood and affect.     Assessment/Plan  1. Acute on chronic systolic heart  failure, NYHA class 4, LVEF 25% by echo from 05/19/2011 2. CAD, s/p CABG 2007 3. IDDM 4  Asthma  I will admit the patient to cardiology for intravenous diuresis since she is still symptomatic and has rales on examination.  I will start Lasix 80 mg i,v. and obtain 2 more sets of cardiac markers to see if she has ruled in for MI.  I will increase her Carvedilol from 6.25 mg to 12.5 mg twice daily.  I will obtain 2-D echo to re-evaluate her left ventricular function since it has been about 6 months since the last assessment. We will re-evaluate the patient tomorrow and determine if she needs ischemia work-up.  Motty Borin E 10/20/2011, 7:38 PM

## 2011-10-21 DIAGNOSIS — I369 Nonrheumatic tricuspid valve disorder, unspecified: Secondary | ICD-10-CM

## 2011-10-21 LAB — GLUCOSE, CAPILLARY
Glucose-Capillary: 220 mg/dL — ABNORMAL HIGH (ref 70–99)
Glucose-Capillary: 190 mg/dL — ABNORMAL HIGH (ref 70–99)
Glucose-Capillary: 167 mg/dL — ABNORMAL HIGH (ref 70–99)

## 2011-10-21 LAB — BASIC METABOLIC PANEL
BUN: 15 mg/dL (ref 6–23)
CO2: 32 mEq/L (ref 19–32)
Chloride: 96 mEq/L (ref 96–112)
GFR calc non Af Amer: 46 mL/min — ABNORMAL LOW (ref >90)
Glucose, Bld: 230 mg/dL — ABNORMAL HIGH (ref 70–99)
Potassium: 4.3 mEq/L (ref 3.5–5.1)
Sodium: 139 mEq/L (ref 135–145)

## 2011-10-21 NOTE — Progress Notes (Signed)
Admitted pt to rm [id number] from ED. Alert and oriented, no c/o pain at this time. Oriented to room, call bell placed within reach. SR on heart monitor, orders carried out. Will continue to monitor.

## 2011-10-21 NOTE — Progress Notes (Signed)
  Echocardiogram 2D Echocardiogram has been performed.  Shery Wauneka L 10/21/2011, 1:49 PM

## 2011-10-22 ENCOUNTER — Encounter (HOSPITAL_COMMUNITY): Payer: Self-pay | Admitting: Dietician

## 2011-10-22 DIAGNOSIS — I5023 Acute on chronic systolic (congestive) heart failure: Secondary | ICD-10-CM

## 2011-10-22 DIAGNOSIS — I5022 Chronic systolic (congestive) heart failure: Secondary | ICD-10-CM

## 2011-10-22 DIAGNOSIS — I509 Heart failure, unspecified: Secondary | ICD-10-CM

## 2011-10-22 LAB — CARDIAC PANEL(CRET KIN+CKTOT+MB+TROPI)
CK, MB: 4.6 ng/mL — ABNORMAL HIGH (ref 0.3–4.0)
Relative Index: 4 — ABNORMAL HIGH (ref 0.0–2.5)
Total CK: 115 U/L (ref 7–177)
Total CK: 109 U/L (ref 7–177)
Troponin I: <0.3 ng/mL (ref <0.30)

## 2011-10-22 LAB — GLUCOSE, CAPILLARY
Glucose-Capillary: 196 mg/dL — ABNORMAL HIGH (ref 70–99)
Glucose-Capillary: 348 mg/dL — ABNORMAL HIGH (ref 70–99)
Glucose-Capillary: 84 mg/dL (ref 70–99)

## 2011-10-22 LAB — CBC
Hemoglobin: 15.3 g/dL — ABNORMAL HIGH (ref 12.0–15.0)
RBC: 5.13 MIL/uL — ABNORMAL HIGH (ref 3.87–5.11)
WBC: 9.6 10*3/uL (ref 4.0–10.5)

## 2011-10-22 LAB — BASIC METABOLIC PANEL
CO2: 30 mEq/L (ref 19–32)
Chloride: 97 mEq/L (ref 96–112)
Sodium: 138 mEq/L (ref 135–145)

## 2011-10-22 MED ORDER — INSULIN ASPART 100 UNIT/ML ~~LOC~~ SOLN
0.0000 [IU] | Freq: Three times a day (TID) | SUBCUTANEOUS | Status: DC
  Administered 2011-10-22: 11 [IU] via SUBCUTANEOUS
  Administered 2011-10-23: 3 [IU] via SUBCUTANEOUS
  Administered 2011-10-23: 5 [IU] via SUBCUTANEOUS
  Administered 2011-10-24: 8 [IU] via SUBCUTANEOUS

## 2011-10-22 MED ORDER — FUROSEMIDE 20 MG PO TABS
20.0000 mg | ORAL_TABLET | Freq: Once | ORAL | Status: AC
  Administered 2011-10-22: 20 mg via ORAL
  Filled 2011-10-22: qty 1

## 2011-10-22 MED ORDER — FUROSEMIDE 40 MG PO TABS
40.0000 mg | ORAL_TABLET | Freq: Two times a day (BID) | ORAL | Status: DC
  Administered 2011-10-22 – 2011-10-24 (×5): 40 mg via ORAL
  Filled 2011-10-22 (×6): qty 1

## 2011-10-22 MED ORDER — FUROSEMIDE 20 MG PO TABS
20.0000 mg | ORAL_TABLET | Freq: Two times a day (BID) | ORAL | Status: DC
  Administered 2011-10-22: 20 mg via ORAL
  Filled 2011-10-22 (×3): qty 1

## 2011-10-22 NOTE — Consult Note (Signed)
Pt smokes around 10-15 cigarettes per day. Says she's cut down from gradually from one ppd. Pt is in preparation stage and plans to quit gradually by cutting down until she quits. She's not interested in patches to quit or while she's here in the hospital. Encouraged pt to continue cutting back until she completely quits. Referred to 1-800 quit now for f/u and support. Discussed oral fixation substitutes, second hand smoke and in home smoking policy. Reviewed and gave pt Written education/contact information.

## 2011-10-22 NOTE — Progress Notes (Signed)
UR Completed. Blessings Inglett, RN, Nurse Case Manager [phone]     

## 2011-10-22 NOTE — Progress Notes (Signed)
Inpatient Diabetes Program Recommendations  AACE/ADA: New Consensus Statement on Inpatient Glycemic Control (2009)  Target Ranges:  Prepandial:   less than 140 mg/dL      Peak postprandial:   less than 180 mg/dL (1-2 hours)      Critically ill patients:  140 - 180 mg/dL   Reason for Visit: Hyperglycemia  Inpatient Diabetes Program Recommendations HgbA1C: Last Hbg A 1C's sub-optimal at 9.8 and 8.9 in January.  Please consider ordering Hbg A1C to determine if there has been any improvement in control.  Thank you.  Note:  Results for ALTHEIA, SHAFRAN (MRN [id number]) as of 10/22/2011 10:59  Ref. Range 10/21/2011 06:33 10/21/2011 11:24 10/21/2011 16:05 10/21/2011 21:48 10/22/2011 06:04  Glucose-Capillary Latest Range: 70-99 mg/dL 782 (H) 956 (H) 213 (H) 167 (H) 196 (H)   May need increase in Lantus dose.  Intake only 15 to 50% as documented. Kory Rains S. Elsie Lincoln, RN, CNS, CDE  [phone])

## 2011-10-22 NOTE — Progress Notes (Signed)
Cardiology Progress Note Patient Name: [name] Date of Encounter: 10/22/2011, 8:19 AM     Subjective  Patient reports that for the last week or two she has been more short of breath and unable to do normal ADLs. She reports that her left leg was extremely swollen and tender (outpt LE doppler neg for DVT 6/12). She has also felt an uneasiness in her chest that she doesn't describe as a pain, but has a hard time qualifying. It is better with sitting/standing up. No recent prolonged immobilization/long travel/surgery, no history of cancer or bleeding/clotting disorders.  Today her breathing is "tremendously better" as is the uneasiness in her chest. She has been able to ambulate to the bathroom without difficulty.    Objective   Telemetry: Sinus rhythm 60s-70s  Medications: . atorvastatin  80 mg Oral q1800  . budesonide-formoterol  2 puff Inhalation BID  . carvedilol  12.5 mg Oral Q12H  . clopidogrel  75 mg Oral Daily  . DULoxetine  60 mg Oral Daily  . enoxaparin  40 mg Subcutaneous Q24H  . folic acid  1 mg Oral Daily  . furosemide  80 mg Intravenous Daily  . insulin aspart  23 Units Subcutaneous Once  . insulin glargine  15 Units Subcutaneous BH-q7a  . lisinopril  20 mg Oral BID  . pantoprazole  40 mg Oral Daily  . pregabalin  50 mg Oral BID  . sodium chloride  3 mL Intravenous Q12H  . spironolactone  12.5 mg Oral Daily    Physical Exam: Temp:  [97.7 F (36.5 C)-98.4 F (36.9 C)] 98 F (36.7 C) (06/17 0525) Pulse Rate:  [59-76] 72  (06/17 0525) Resp:  [18-20] 20  (06/17 0525) BP: (126-147)/(66-80) 139/78 mmHg (06/17 0525) SpO2:  [91 %-98 %] 93 % (06/17 0525) Weight:  [146 lb 9.7 oz (66.5 kg)] 146 lb 9.7 oz (66.5 kg) (06/17 0525)  General: Elderly white female, in no acute distress. Head: Normocephalic, atraumatic, sclera non-icteric, nares are without discharge.  Neck: Supple. Negative for carotid bruits or JVD Lungs: Bibasilar rales and scattered wheezes.  Breathing is unlabored. Heart: RRR S1 S2. 1/6 SEM LUSB. No rubs, or gallops.  Abdomen: Soft, non-tender, non-distended with normoactive bowel sounds. No rebound/guarding. No obvious abdominal masses. Msk:  Strength and tone appear normal for age. Extremities: Left calf mildly tender. No edema. No clubbing or cyanosis. Distal pedal pulses are intact and equal bilaterally. Neuro: Alert and oriented X 3. Moves all extremities spontaneously. Psych:  Responds to questions appropriately with a normal affect.   Intake/Output Summary (Last 24 hours) at 10/22/11 0819 Last data filed at 10/22/11 0721  Gross per 24 hour  Intake    603 ml  Output   1200 ml  Net   -597 ml    Labs:  [facility] 10/22/11 0630 10/21/11 0645  NA 138 139  K 3.7 4.3  CL 97 96  CO2 30 32  GLUCOSE 182* 230*  BUN 23 15  CREATININE 1.21* 1.20*  CALCIUM 9.4 9.7   Basename 10/20/11 2237 10/20/11 1604  WBC 10.7* --  HGB 15.3* 15.3*  HCT 44.1 45.0  MCV 87.5 --  PLT 169 --   Basename 10/20/11 1538  TROPONINI <0.30     10/22/2011 08:46  CK, MB 4.6 (H)  CK Total 115  Troponin I <0.30    10/20/2011 15:38  Pro B Natriuretic peptide (BNP) 6469.0 (H)    Radiology/Studies:   10/21/11 - 2D  Echocardiogram Study Conclusions: - Left ventricle: The cavity size was normal. Wall thickness was increased in a pattern of mild LVH. The estimated ejection fraction was in the range of 15% to 20%. Severe diffuse hypokinesis. Doppler parameters are consistent with abnormal left ventricular relaxation (grade 1 diastolic dysfunction). Doppler parameters are consistent with elevated mean left atrial filling pressure. - Aortic valve: A bicuspid morphology cannot be excluded; mildly thickened, moderately calcified leaflets. - Right ventricle: Systolic function was mildly reduced. - Atrial septum: No defect or patent foramen ovale was identified. - Pulmonary arteries: PA peak pressure: 32mm Hg (S).   10/20/2011 - Chest 2 View Findings:  Grossly unchanged enlarged cardiac silhouette and mediastinal contours post median sternotomy and CABG.  The lungs remain hyperexpanded with flattening of bilateral hemidiaphragms. There is grossly unchanged diffuse thickening of the pulmonary interstitium with right basilar atelectasis/scar. No new focal airspace opacities.  Grossly unchanged scoliotic curvature of the thoracolumbar spine.  Calcifications within the abdominal aorta.  IMPRESSION: Chronic bronchitic change without definite acute cardiopulmonary disease.     Assessment and Plan  66 y.o. female w/ PMHx significant for ongoing tobacco abuse, HTN, DM, CVA, CAD s/p CABG, and Chronic Mixed CHF who presented to [facility] on 10/20/11 with complaints of worsening shortness of breath.  1. Acute Respiratory Failure: Patient presented with 1-2wks of worsening sob, doe, orthopnea, and LE edema (L>R). Multiple possible etiologies including COPD exacerbation (continues to smoke), CHF exacerbation, ACS, or PE. BLE doppler performed on the 12th did not reveal DVT. No further LE edema after diuresis. Low suspicion for PE. Cardiac enzymes normal, EKG non acute. Do not suspect ACS. Has diuresed well with improvement in symptoms.  2. Acute on Chronic Mixed CHF: EF 25% on echo five months ago. Now 15-20%. BNP 6469. Diuresed net neg 1.5L since admission on 80mg  IV lasix daily. Wt down 4lbs. Crt 0.9 --> 1.21. Significant symptomatic improvement. Coreg was increased from 6.25mg  to 12.5mg  BID on admission. Cont BB, ACEI, and Spironolactone. Change to oral Lasix 40mg  BID.  Monitor renal function and electrolytes.   3. CAD: s/p 4v CABG 2007. EKG nonacute. Cardiac enzymes normal. Do not suspect ACS. Cont Plavix, BB, ACEI, statin.  4. Tobacco Abuse: Continues to abuse tobacco. Will get tobacco cessation consult.  5. HTN: Coreg increased on admission. SBP 130-140s.   6. DM: Insulin Dependent. CBGs elevated. Cont Lantus. Add SSI  Signed, HOPE, JESSICA  PA-C  Attending Note:   The patient was seen and examined.  Agree with assessment and plan as noted above.  Pt's symptoms seem to be more related to CHF exacerbation.  I doubt that she has ACS - although she continues to smoke. This is likely a combination of COPD and CHF.  She is clearly better with diuresis.  Pain is not pleuritic- doubt PE .  Will change Lasix to PO.  Ambulate.  Anticipate DC tomorrow or the next day if she remains stable.  Vesta Mixer, Montez Hageman., MD, [facility] 10/22/2011, 11:31 AM

## 2011-10-23 LAB — GLUCOSE, CAPILLARY
Glucose-Capillary: 183 mg/dL — ABNORMAL HIGH (ref 70–99)
Glucose-Capillary: 102 mg/dL — ABNORMAL HIGH (ref 70–99)

## 2011-10-23 LAB — BASIC METABOLIC PANEL
Calcium: 9.3 mg/dL (ref 8.4–10.5)
GFR calc non Af Amer: 47 mL/min — ABNORMAL LOW (ref >90)
Glucose, Bld: 189 mg/dL — ABNORMAL HIGH (ref 70–99)
Sodium: 138 mEq/L (ref 135–145)

## 2011-10-23 MED ORDER — REGADENOSON 0.4 MG/5ML IV SOLN
0.4000 mg | Freq: Once | INTRAVENOUS | Status: DC
  Filled 2011-10-23: qty 5

## 2011-10-23 NOTE — Progress Notes (Signed)
104       Cardiology Progress Note Patient Name: [name] Date of Encounter: 10/23/2011, 9:41 AM     Subjective  Patient reports that for the last week or two she has been more short of breath and unable to do normal ADLs. She reports that her left leg was extremely swollen and tender (outpt LE doppler neg for DVT 6/12). She has also felt an uneasiness in her chest that she doesn't describe as a pain, but has a hard time qualifying. It is better with sitting/standing up. No recent prolonged immobilization/long travel/surgery, no history of cancer or bleeding/clotting disorders.  Today her breathing is "tremendously better" as is the uneasiness in her chest. She has been able to ambulate to the bathroom without difficulty.    Objective   Telemetry: Sinus rhythm 60s-70s  Medications: . atorvastatin  80 mg Oral q1800  . budesonide-formoterol  2 puff Inhalation BID  . carvedilol  12.5 mg Oral Q12H  . clopidogrel  75 mg Oral Daily  . DULoxetine  60 mg Oral Daily  . enoxaparin  40 mg Subcutaneous Q24H  . folic acid  1 mg Oral Daily  . furosemide  80 mg Intravenous Daily  . insulin aspart  23 Units Subcutaneous Once  . insulin glargine  15 Units Subcutaneous BH-q7a  . lisinopril  20 mg Oral BID  . pantoprazole  40 mg Oral Daily  . pregabalin  50 mg Oral BID  . sodium chloride  3 mL Intravenous Q12H  . spironolactone  12.5 mg Oral Daily    Physical Exam: Temp:  [97 F (36.1 C)-98.4 F (36.9 C)] 97.9 F (36.6 C) (06/18 0609) Pulse Rate:  [61-67] 67  (06/18 0609) Resp:  [18-20] 18  (06/18 0609) BP: (110-155)/(60-98) 150/83 mmHg (06/18 0609) SpO2:  [91 %-97 %] 91 % (06/18 0700) Weight:  [146 lb 11.2 oz (66.543 kg)] 146 lb 11.2 oz (66.543 kg) (06/18 0609)  General: Elderly white female, in no acute distress. Head: Normocephalic, atraumatic, sclera non-icteric, nares are without discharge.  Neck: Supple. Negative for carotid bruits or JVD Lungs: Bibasilar rales and scattered  wheezes. Breathing is unlabored. Heart: RRR S1 S2. 1/6 SEM LUSB. No rubs, or gallops.  Abdomen: Soft, non-tender, non-distended with normoactive bowel sounds. No rebound/guarding. No obvious abdominal masses. Msk:  Strength and tone appear normal for age. Extremities: Left calf mildly tender. No edema. No clubbing or cyanosis. Distal pedal pulses are intact and equal bilaterally. Neuro: Alert and oriented X 3. Moves all extremities spontaneously. Psych:  Responds to questions appropriately with a normal affect.   Intake/Output Summary (Last 24 hours) at 10/23/11 0941 Last data filed at 10/23/11 0849  Gross per 24 hour  Intake   1323 ml  Output   1401 ml  Net    -78 ml    Labs:  [facility] 10/22/11 0630 10/21/11 0645  NA 138 139  K 3.7 4.3  CL 97 96  CO2 30 32  GLUCOSE 182* 230*  BUN 23 15  CREATININE 1.21* 1.20*  CALCIUM 9.4 9.7   Basename 10/20/11 2237 10/20/11 1604  WBC 10.7* --  HGB 15.3* 15.3*  HCT 44.1 45.0  MCV 87.5 --  PLT 169 --   Basename 10/20/11 1538  TROPONINI <0.30     10/22/2011 08:46  CK, MB 4.6 (H)  CK Total 115  Troponin I <0.30    10/20/2011 15:38  Pro B Natriuretic peptide (BNP) 6469.0 (H)    Radiology/Studies:   10/21/11 -  2D Echocardiogram Study Conclusions: - Left ventricle: The cavity size was normal. Wall thickness was increased in a pattern of mild LVH. The estimated ejection fraction was in the range of 15% to 20%. Severe diffuse hypokinesis. Doppler parameters are consistent with abnormal left ventricular relaxation (grade 1 diastolic dysfunction). Doppler parameters are consistent with elevated mean left atrial filling pressure. - Aortic valve: A bicuspid morphology cannot be excluded; mildly thickened, moderately calcified leaflets. - Right ventricle: Systolic function was mildly reduced. - Atrial septum: No defect or patent foramen ovale was identified. - Pulmonary arteries: PA peak pressure: 32mm Hg (S).   10/20/2011 - Chest 2  View Findings: Grossly unchanged enlarged cardiac silhouette and mediastinal contours post median sternotomy and CABG.  The lungs remain hyperexpanded with flattening of bilateral hemidiaphragms. There is grossly unchanged diffuse thickening of the pulmonary interstitium with right basilar atelectasis/scar. No new focal airspace opacities.  Grossly unchanged scoliotic curvature of the thoracolumbar spine.  Calcifications within the abdominal aorta.  IMPRESSION: Chronic bronchitic change without definite acute cardiopulmonary disease.     Assessment and Plan  66 y.o. female w/ PMHx significant for ongoing tobacco abuse, HTN, DM, CVA, CAD s/p CABG, and Chronic Mixed CHF who presented to [facility] on 10/20/11 with complaints of worsening shortness of breath.  1. Acute Respiratory Failure: Patient presented with 1-2wks of worsening sob, doe, orthopnea, and LE edema (L>R). Multiple possible etiologies including COPD exacerbation (continues to smoke), CHF exacerbation, ACS, or PE. BLE doppler performed on the 12th did not reveal DVT. No further LE edema after diuresis. Low suspicion for PE. Cardiac enzymes normal, EKG non acute. Has diuresed well with improvement in symptoms.  2. Acute on Chronic Mixed CHF: EF 25% on echo five months ago. Now 15-20%. BNP 6469. Diuresed net neg 1.5L since admission on 80mg  IV lasix daily. Wt down 4lbs. Crt 0.9 --> 1.21. Significant symptomatic improvement. Coreg was increased from 6.25mg  to 12.5mg  BID on admission. Cont BB, ACEI, and Spironolactone. Change to oral Lasix 40mg  BID.  Monitor renal function and electrolytes.   If her EF remains this low, we will need to refer her to EP for consideration of an ICD.  3. CAD: s/p 4v CABG 2007. EKG nonacute. Cardiac enzymes normal.  Cont Plavix, BB, ACEI, statin.    We will get a [employer].  I dont have any other explanation for her worsening CHF.  4. Tobacco Abuse: Continues to abuse tobacco. Will get  tobacco cessation consult.  5. HTN: Coreg increased on admission. SBP 130-140s.   6. DM: Insulin Dependent. CBGs elevated. Cont Lantus.

## 2011-10-23 NOTE — Plan of Care (Signed)
Problem: Phase I Progression Outcomes Goal: EF % per last Echo/documented,Core Reminder form on chart Outcome: Completed/Met Date Met:  10/23/11 EF 15-20% per ECHO performed on 10/22/11.

## 2011-10-24 ENCOUNTER — Other Ambulatory Visit: Payer: Self-pay | Admitting: *Deleted

## 2011-10-24 ENCOUNTER — Inpatient Hospital Stay (HOSPITAL_COMMUNITY): Payer: Medicare Other

## 2011-10-24 ENCOUNTER — Ambulatory Visit: Payer: Medicare Other | Admitting: Cardiovascular Disease

## 2011-10-24 ENCOUNTER — Telehealth: Payer: Self-pay | Admitting: Cardiovascular Disease

## 2011-10-24 DIAGNOSIS — E113299 Type 2 diabetes mellitus with mild nonproliferative diabetic retinopathy without macular edema, unspecified eye: Secondary | ICD-10-CM

## 2011-10-24 DIAGNOSIS — I5043 Acute on chronic combined systolic (congestive) and diastolic (congestive) heart failure: Secondary | ICD-10-CM

## 2011-10-24 DIAGNOSIS — I10 Essential (primary) hypertension: Secondary | ICD-10-CM

## 2011-10-24 DIAGNOSIS — R0602 Shortness of breath: Secondary | ICD-10-CM

## 2011-10-24 DIAGNOSIS — J9600 Acute respiratory failure, unspecified whether with hypoxia or hypercapnia: Secondary | ICD-10-CM

## 2011-10-24 LAB — GLUCOSE, CAPILLARY

## 2011-10-24 MED ORDER — ACETAMINOPHEN 325 MG PO TABS
ORAL_TABLET | ORAL | Status: DC | PRN
  Administered 2011-10-24: 650 mg via ORAL

## 2011-10-24 MED ORDER — CARVEDILOL 12.5 MG PO TABS: 12.5000 mg | ORAL_TABLET | Freq: Two times a day (BID) | ORAL | Status: DC

## 2011-10-24 MED ORDER — FUROSEMIDE 40 MG PO TABS: 40.0000 mg | ORAL_TABLET | Freq: Two times a day (BID) | ORAL | Status: DC

## 2011-10-24 MED ORDER — REGADENOSON 0.4 MG/5ML IV SOLN
INTRAVENOUS | Status: AC | PRN
  Administered 2011-10-24: 0.4 mg via INTRAVENOUS

## 2011-10-24 MED ORDER — TECHNETIUM TC 99M TETROFOSMIN IV KIT
10.0000 | PACK | Freq: Once | INTRAVENOUS | Status: AC | PRN
  Administered 2011-10-24: 10 via INTRAVENOUS

## 2011-10-24 MED ORDER — TECHNETIUM TC 99M TETROFOSMIN IV KIT
30.0000 | PACK | Freq: Once | INTRAVENOUS | Status: AC | PRN
  Administered 2011-10-24: 30 via INTRAVENOUS

## 2011-10-24 MED ORDER — ACETAMINOPHEN 325 MG PO TABS
ORAL_TABLET | ORAL | Status: AC
  Filled 2011-10-24: qty 2

## 2011-10-24 NOTE — Discharge Instructions (Signed)
PLEASE REMEMBER TO BRING ALL OF YOUR MEDICATIONS TO EACH OF YOUR FOLLOW-UP OFFICE VISITS.  PLEASE TAKE ALL NEW MEDICATIONS/MEDICATION CHANGES AS PRESCRIBED.   PLEASE FOLLOW HEART FAILURE INSTRUCTIONS INCLUDED BELOW.   ON YOUR STRESS TEST IMAGING, THERE WAS AN ABNORMAL REGION WITH INCREASED CONTRAST UPTAKE ("LIT UP") ON YOUR LEFT CHEST/LEFT ARM PIT REGION. PLEASE FOLLOW-UP WITH YOUR PRIMARY CARE PROVIDER AS INDICATED IN 1-2 WEEKS FOR FURTHER EVALUATION.   CONSIDERATION OF ICD (CARDIAC DEFIBRILLATOR) PLACEMENT WILL BE DISCUSSED AT YOUR FOLLOW-UP APPOINTMENT ON 11/07/11.   Heart Failure  Heart failure (HF) is a condition in which the heart has trouble pumping blood. This means your heart does not pump blood efficiently for your body to work well. In some cases of HF, fluid may back up into your lungs or you may have swelling (edema) in your lower legs. HF is a long-term (chronic) condition. It is important for you to take good care of yourself and follow your caregiver's treatment plan.  CAUSES  Health conditions:  High blood pressure (hypertension) causes the heart muscle to work harder than normal. When pressure in the blood vessels is high, the heart needs to pump (contract) with more force in order to circulate blood throughout the body. High blood pressure eventually causes the heart to become stiff and weak.  Coronary artery disease (CAD) is the buildup of cholesterol and fat (plaques) in the arteries of the heart. The blockage in the arteries deprives the heart muscle of oxygen and blood. This can cause chest pain and may lead to a heart attack. High blood pressure can also contribute to CAD.  Heart attack (myocardial infarction) occurs when 1 or more arteries in the heart become blocked. The loss of oxygen damages the muscle tissue of the heart. When this happens, part of the heart muscle dies. The injured tissue does not contract as well and weakens the heart's ability to pump blood.    Abnormal heart valves can cause HF when the heart valves do not open and close properly. This makes the heart muscle pump harder to keep the blood flowing.  Heart muscle disease (cardiomyopathy or myocarditis) is damage to the heart muscle from a variety of causes. These can include drug or alcohol abuse, infections, or unknown reasons. These can increase the risk of HF.  Lung disease makes the heart work harder because the lungs do not work properly. This can cause a strain on the heart leading it to fail.  Diabetes increases the risk of HF. High blood sugar contributes to high fat (lipid) levels in the blood. Diabetes can also cause slow damage to tiny blood vessels that carry important nutrients to the heart muscle. When the heart does not get enough oxygen and food, it can cause the heart to become weak and stiff. This leads to a heart that does not contract efficiently.  Other diseases can contribute to HF. These include abnormal heart rhythms, thyroid problems, and low blood counts (anemia).  Unhealthy lifestyle habits:  Obesity.  Smoking.  Eating foods high in fat and cholesterol.  Eating or drinking beverages high in salt.  Drug or alcohol abuse.  Lack of exercise.  SYMPTOMS  HF symptoms may vary and can be hard to detect. Symptoms may include:  Shortness of breath with activity, such as climbing stairs.  Persistent cough.  Swelling of the feet, ankles, legs, or abdomen.  Unexplained weight gain.  Difficulty breathing when lying flat.  Waking from sleep because of the need to sit  up and get more air.  Rapid heartbeat.  Fatigue and loss of energy.  Feeling lightheaded or close to fainting.  DIAGNOSIS  A diagnosis of HF is based on your history, symptoms, physical examination, and diagnostic tests.  Diagnostic tests for HF may include:  EKG.  Chest X-ray.  Blood tests.  Exercise stress test.  Blood oxygen test (arterial blood gas).  Evaluation by a heart doctor (cardiologist).   Ultrasound evaluation of the heart (echocardiogram).  Heart artery test to look for blockages (angiogram).  Radioactive imaging to look at the heart (radionuclide test).  TREATMENT  Treatment is aimed at managing the symptoms of HF. Medicines, lifestyle changes, or surgical intervention may be necessary to treat HF.  Medicines to help treat HF may include:  Angiotensin-converting enzyme (ACE) inhibitors. These block the effects of a blood protein called angiotensin-converting enzyme. ACE inhibitors relax (dilate) the blood vessels and help lower blood pressure. This decreases the workload of the heart, slows the progression of HF, and improves symptoms.  Angiotensin receptor blockers (ARBs). These medications work similar to ACE inhibitors. ARBs may be an alternative for people who cannot tolerate an ACE inhibitor.  Aldosterone antagonists. This medication helps get rid of extra fluid from your body. This lowers the volume of blood the heart has to pump.  Water pills (diuretics). Diuretics cause the kidneys to remove salt and water from the blood. The extra fluid is removed by urination. By removing extra fluid from the body, diuretics help lower the workload of the heart and help prevent fluid buildup in the lungs so breathing is easier.  Beta blockers. These prevent the heart from beating too fast and improve heart muscle strength. Beta blockers help maintain a normal heart rate, control blood pressure, and improve HF symptoms.  Digitalis. This increases the force of the heartbeat and may be helpful to people with HF or heart rhythm problems.  Healthy lifestyle changes include:  Stopping smoking.  Eating a healthy diet. Avoid foods high in fat. Avoid foods fried in oil or made with fat. A dietician can help with healthy food choices.  Limiting how much salt you eat.  Limiting alcohol intake to no more than 1 drink per day for women and 2 drinks per day for men. Drinking more than that is harmful  to your heart. If your heart has already been damaged by alcohol or you have severe HF, drinking alcohol should be stopped completely.  Exercising as directed by your caregiver.  Surgical treatment for HF may include:  Procedures to open blocked arteries, repair damaged heart valves, or remove damaged heart muscle tissue.  A pacemaker to help heart muscle function and to control certain abnormal heart rhythms.  A defibrillator to possibly prevent sudden cardiac death.  HOME CARE INSTRUCTIONS  Activity level. Your caregiver can help you determine what type of exercise program may be helpful. It is important to maintain your strength. Pace your physical activity to avoid shortness of breath or chest pain. Rest for 1 hour before and after meals. A cardiac rehabilitation program may be helpful to some people with HF.  Diet. Eat a heart healthy diet. Food choices should be low in saturated fat and cholesterol. Talk to a dietician to learn about heart healthy foods.  Salt intake. When you have HF, you need to limit the amount of salt you eat. Eat less than 1500 milligrams (mg) of salt per day or as recommended by your caregiver.  Weight monitoring. Weigh yourself every day.  You should weigh yourself in the morning after you urinate and before you eat breakfast. Wear the same amount of clothing each time you weigh yourself. Record your weight daily. Bring your recorded weights to your clinic visits. Tell your caregiver right away if you have gained 3 lb/1.4 kg in 1 day, or 5 lb/2.3 kg in a week or whatever amount you were told to report.  Blood pressure monitoring. This should be done as directed by your caregiver. A home blood pressure cuff can be purchased at a drugstore. Record your blood pressure numbers and bring them to your clinic visits. Tell your caregiver if you become dizzy or lightheaded upon standing up.  Smoking. If you are currently a smoker, it is time to quit. Nicotine makes your heart work  harder by causing your blood vessels to constrict. Do not use nicotine gum or patches before talking to your caregiver.  Follow up. Be sure to schedule a follow-up visit with your caregiver. Keep all your appointments.  SEEK MEDICAL CARE IF:  Your weight increases by 3 lb/1.4 kg in 1 day or 5 lb/2.3 kg in a week.  You notice increasing shortness of breath that is unusual for you. This may happen during rest, sleep, or with activity.  You cough more than normal, especially with physical activity.  You notice more swelling in your hands, feet, ankles, or belly (abdomen).  You are unable to sleep because it is hard to breathe.  You cough up bloody mucus (sputum).  You begin to feel "jumping" or "fluttering" sensations (palpitations) in your chest.  SEEK IMMEDIATE MEDICAL CARE IF:  You have severe chest pain or pressure which may include symptoms such as:  Pain or pressure in the arms, neck, jaw, or back.  Feeling sweaty.  Feeling sick to your stomach (nauseous).  Feeling short of breath while at rest.  Having a fast or irregular heartbeat.  You experience stroke symptoms. These symptoms include:  Facial weakness or numbness.  Weakness or numbness in an arm, leg, or on one side of your body.  Blurred vision.  Difficulty talking or thinking.  Dizziness or fainting.  Severe headache.  THESE ARE MEDICAL EMERGENCIES. Do not wait to see if the symptoms go away. Call your local emergency services (911 in [place] DO NOT drive yourself to the hospital.  IMPORTANT  Make a list of every medicine, vitamin, or herbal supplement you are taking. Keep the list with you at all times. Show it to your caregiver at every visit. Keep the list up-to-date.  Ask your caregiver or pharmacist to write an explanation of each medicine you are taking. This should include:  Why you are taking it.  The possible side effects.  The best time of day to take it.  Foods to take with it or what foods to avoid.  When to stop  taking it.  MAKE SURE YOU:  Understand these instructions.  Will watch your condition.  Will get help right away if you are not doing well or get worse.  Document Released: 04/23/2005 Document Revised: 04/12/2011 Document Reviewed: 08/05/2009  [facility] Patient Information 2012 [place], [place].

## 2011-10-24 NOTE — ED Notes (Signed)
Chest pressure and shortness of breath resolved.

## 2011-10-24 NOTE — ED Notes (Signed)
Cardiac Stress Test with Lexiscan completed. Pt without adverse effects at completion of exam.

## 2011-10-24 NOTE — Progress Notes (Signed)
Order placed for bmet per tony pa

## 2011-10-24 NOTE — ED Notes (Signed)
Pt lethargic, fell asleep in middle of conversation x 2 in 15 min time frame.  No history of narcolepsy noted, no documented medication given in last 24 hours that could account for this.  4700 nurse called to discuss, says he did not see this behavior this AM or yesterday.  Will inform [place] PA

## 2011-10-24 NOTE — ED Notes (Signed)
Start of cardiac stress test.  Malon Kindle, PA [facility] at bedside for start and throughout completion of exam.

## 2011-10-24 NOTE — ED Notes (Signed)
Pt complained of mild chest pressure and mild shortness of breath

## 2011-10-24 NOTE — Discharge Summary (Signed)
Discharge Summary   Patient ID: [name],  MRN: [id number], DOB/AGE: 1945-06-13 66 y.o.  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 Codeine   . Ivp Dye (Iodinated Diagnostic Agents)   . Penicillins   . Sulfa Antibiotics     Diagnostic Studies/Procedures  CHEST X-RAY- 11-02-11   Comparison: 05/18/2011; 05/17/2011  Findings: Grossly unchanged enlarged cardiac silhouette and  mediastinal contours post median sternotomy and CABG. The lungs  remain hyperexpanded with flattening of bilateral hemidiaphragms.  There is grossly unchanged diffuse thickening of the pulmonary  interstitium with right basilar atelectasis/scar. No new focal  airspace opacities. Grossly unchanged scoliotic curvature of the  thoracolumbar spine. Calcifications within the abdominal aorta.  IMPRESSION:  Chronic bronchitic change without definite acute cardiopulmonary  Disease.  2D ECHOCARDIOGRAM WITHOUT CONTRAST- 10/21/11  Study Conclusions  - Left ventricle: The cavity size was normal. Wall thickness was increased in a pattern of mild LVH. The estimated ejection fraction was in the range of 15% to 20%. Severe diffuse hypokinesis. Doppler parameters are consistent with abnormal left ventricular relaxation (grade 1 diastolic dysfunction). Doppler parameters are consistent with elevated mean left atrial filling pressure. - Aortic valve: A bicuspid morphology cannot be excluded; mildly thickened, moderately calcified leaflets. - Right ventricle: Systolic function was mildly reduced. - Atrial septum: No defect or patent foramen ovale  was identified. - Pulmonary arteries: PA peak pressure: 32mm Hg (S).  LEXISCAN MYOVIEW- 10/24/11  The patient developed chest pressure and increased dyspnea during  the procedure. EKG was abnormal at rest and ST-T changes  increased during the infusion of lexiscan.  The quality of the images is degraded by significant subcardiac  isotope adjacent to heart border.  There is a perfusion defect of moderate size and moderate severity  affecting the anteroapical and mid-anterior myocardium on both the  rest and stress images consistent with myocardial scar. There does  not appear to be significant reversibility.  The EDV is  The ESV is 99 ml  The EF is 31% with global hypokinesis.  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  Also noted on raw images is a bright target which may be in left  breast or left chest wall or axilla. Clinical correlation advised.  History of Present Illness  [name] is a 66yo Caucasian female with the above problem list who was admitted to [facility] on November 02, 2011 for acute respiratory failure secondary to acute on chronic combined CHF.   She described a 1 week history of worsening shortness of breath occurring on minimal exertion and eventually at rest. She did endorse a "heavy feeling" in her chest. She reported 4 pillow orthopnea (2 pillow at baseline) and PND. She also endorsed increased bilateral LE edema. She increased her Lasix and noticed a decrease to her edema, but shortness of breath persisted. She presented to an urgent care and was referred to an ER.   On ED arrival, pBNP was elevated. CXR as above revealed chronic bronchitic changes without acute cardiopulmonary disease. Initial troponin-I was WNL. She was evaluated and found to have rales on exam. She was subsequently admitted to the cardiology service.  Hospital Course   She was started on Lasix IV. Coreg was  increased. Outpatient medications were continued including ACEi and Aldactone. Two subsequent sets of cardiac biomarkers were ordered and returned WNL. The patient diuresed well intially with improvement in dyspnea and LE edema. A 2D echocardiogram was obtained with results as above. This was notable for mild LVH, LVEF 15-20%, grade 1 diastolic dysfunction, mild increased LA filling pressures, mild RV systolic dysfunction. Additionally, a bicuspid AV could not be excluded.   Tobacco cessation counseling was consulted and the patient plans to cut back on smoking gradually without assistance. The patient continued to diurese and improved symptomatically. She was scheduled for Lexiscan Myoview to exclude an ischemic etiology to her CHF exacerbation. The results are described in full. 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 LVEF was found to be 31%. Additionally, an abnormal, bright target was identified in the left breast or axilla with further clinical evaluation recommended.   [name] was informed of the stress test results, and found her fit for discharge. Total I/O this admission - 3979 cc. pBNP down to 1613 today. Weight down 3 lbs. There were no acute events this admission. She will continue up-titrated BB and increased Lasix dosing on discharge. She will have BMET in 1 week to evaluate electrolytes and renal function given diuretic adjustments. She will follow-up in the office with Norma Fredrickson in 2 weeks. At that time, ICD consideration can be discussed given the patient's low EF. There was evidence of occasional PVCs, otherwise no arrhythmia documented this admission. She was advised to follow-up with her PCP in 1 week to further evaluate the abnormal Myoview finding. This information, including supplemental heart failure material, has been clearly outlined in the discharge AVS.   Discharge Vitals:  Blood pressure 154/83, pulse 62, temperature 97.1 F (36.2 C), temperature source Oral,  resp. rate 18, height 5' (1.524 m), weight 66.8 kg (147 lb 4.3 oz), SpO2 92.00%.   Weight change: 0.257 kg (9.1 oz)  Labs: Recent Labs  [facility] 10/22/11 0855   WBC 9.6   HGB 15.3*   HCT 45.0   MCV 87.7   PLT 182   Lab 10/23/11 0640 10/22/11 0630 10/21/11 0645  NA 138 138 139  K 3.7 3.7 4.3  CL 95* 97 96  CO2 30 30 32  BUN 27* 23 15  CREATININE 1.18* 1.21* 1.20*  CALCIUM 9.3 9.4 9.7  PROT -- -- --  BILITOT -- -- --  ALKPHOS -- -- --  ALT -- -- --  AST -- -- --  AMYLASE -- -- --  LIPASE -- -- --  GLUCOSE 189* 182* 230*   Recent Labs  [facility] 10/22/11 1428 10/22/11 0846   CKTOTAL 109 115   CKMB 4.6* 4.6*   CKMBINDEX -- --   TROPONINI <0.30 <0.30   Disposition:  Discharge Orders    Future Appointments: Provider: Department: Dept Phone: Center:   10/29/2011 10:00 AM [facility] [phone] None   10/29/2011 1:15 PM Erick Colace, MD [facility] [phone] None   10/31/2011 12:20 PM [facility] [phone]   11/07/2011 10:00 AM Rosalio Macadamia, NP [facility] [phone] None     Follow-up Information    Follow up with Norma Fredrickson, NP on 11/07/2011. (At 10:00 AM for follow-up after this admission.)    Contact information:   [address] [place] [address] [phone]  Follow up with [facility] in 1 week. (For labwork. )    Contact information:   [address] [place] [address]       Follow up with ALM,STEPHANIE, MD. Schedule an appointment as soon as possible for a visit in 1 week. (For follow-up after this hospitalization and to further evaluate abnormal stress testing imaging finding. )    Contact information:   [address] [place] [address] [phone]         Discharge Medications:  Medication List  As of 10/24/2011  4:52 PM   CHANGE how you take these medications          carvedilol 12.5 MG tablet   Commonly known as: COREG   Take 1 tablet (12.5 mg total) by mouth every 12 (twelve) hours.   What changed: - medication strength - dose - how often to take the med      furosemide 40 MG tablet   Commonly known as: LASIX   Take 1 tablet (40 mg total) by mouth 2 (two) times daily.   What changed: - medication strength - dose - how often to take the med         CONTINUE taking these medications         albuterol 108 (90 BASE) MCG/ACT inhaler   Commonly known as: PROVENTIL HFA;VENTOLIN HFA      ALPRAZolam 0.5 MG tablet   Commonly known as: XANAX      BENADRYL ALLERGY 25 MG Strp   Generic drug: DiphenhydrAMINE HCl      budesonide-formoterol 160-4.5 MCG/ACT inhaler   Commonly known as: SYMBICORT      clopidogrel 75 MG tablet   Commonly known as: PLAVIX      DULoxetine 60 MG capsule   Commonly known as: CYMBALTA      folic acid 1 MG tablet   Commonly known as: FOLVITE      insulin glargine 100 UNIT/ML injection   Commonly known as: LANTUS      insulin lispro 100 UNIT/ML injection   Commonly known as: HUMALOG      lisinopril 20 MG tablet   Commonly known as: PRINIVIL,ZESTRIL      nitroGLYCERIN 0.4 MG SL tablet   Commonly known as: NITROSTAT      pantoprazole 20 MG tablet   Commonly known as: PROTONIX      potassium chloride 10 MEQ tablet   Commonly known as: K-DUR      pregabalin 50 MG capsule   Commonly known as: LYRICA      rosuvastatin 40 MG tablet   Commonly known as: CRESTOR      spironolactone 25 MG tablet   Commonly known as: ALDACTONE         STOP taking these medications         clindamycin 300 MG capsule      hydrochlorothiazide 12.5 MG capsule          Where to get your medications    These are the prescriptions that you need to pick up. We sent them to a specific pharmacy, so you will need to go there to get them.   [facility] - [place] [place] - [address]    [address] [place] [address]    Phone: [phone]        carvedilol 12.5 MG tablet   furosemide 40 MG tablet  Outstanding Labs/Studies: BMET 1 week  Duration of Discharge Encounter: Greater than 30 minutes including physician time.  Signed, R. Hurman Horn, PA-C 10/24/2011, 4:52 PM   Attending Note:   The patient was seen and examined.  Agree with assessment and plan as noted above.  See my note from earlier 10/24/11. Pt has a lung nodule noted on myoview.  Sent note to Ancil Boozer, MD  Vesta Mixer, Montez Hageman., MD, [facility] 10/25/2011, 8:09 AM

## 2011-10-24 NOTE — Progress Notes (Signed)
   CARE MANAGEMENT NOTE 10/24/2011  Patient:  [name], [name]   Account Number:  [id number]  Date Initiated:  10/24/2011  Documentation initiated by:  Tera Mater  Subjective/Objective Assessment:   66yo female admitted with SOB and LLE.  Pt. lives at home with granddaughter.     Action/Plan:   Spoke with pt. about HH services and pt. is able to take care of herself at home.  Pt. uses a cane.   Anticipated DC Date:  10/25/2011   Anticipated DC Plan:  HOME/SELF CARE      DC Planning Services  CM consult      Choice offered to / List presented to:             Status of service:  In process, will continue to follow Medicare Important Message given?   (If response is "NO", the following Medicare IM given date fields will be blank) Date Medicare IM given:   Date Additional Medicare IM given:    Discharge Disposition:    Per UR Regulation:  Reviewed for med. necessity/level of care/duration of stay  If discussed at Long Length of Stay Meetings, dates discussed:    Comments:  10/23/11 1130 HF Screen:   Spoke with pt. about HF education.  Pt. states that she does not weigh herself everyday, however she can tell that she is gaining weight when her hands start to feel tight.  When she feels that she is gaining fluid weight, she will double her lasix dose for a day or two.  Noted she has her Living Better with Heart Failure booklet.  At this time the pt. states she does not want HH services.  NCM to follow. Tera Mater, RN, BSN NCM [phone]

## 2011-10-24 NOTE — ED Notes (Signed)
T. Arguela, PA informed of pt falling asleep while speaking and being spoken to this am.  No new orders at this time.

## 2011-10-24 NOTE — ED Notes (Signed)
Pt complained of mild headache.  Tylenol given per orders

## 2011-10-24 NOTE — Progress Notes (Signed)
Talked to pts daughter about pt having a hx of being narcoleptic.  Pts daughter stated that she has been known to fall asleep in the middle of speaking in the past and that it is usually due to pt being so exhausted.

## 2011-10-24 NOTE — Telephone Encounter (Signed)
error 

## 2011-10-25 LAB — GLUCOSE, CAPILLARY: Glucose-Capillary: 290 mg/dL — ABNORMAL HIGH (ref 70–99)

## 2011-10-29 ENCOUNTER — Encounter: Payer: Medicare Other | Attending: Physical Medicine & Rehabilitation

## 2011-10-29 ENCOUNTER — Ambulatory Visit: Payer: Medicare Other | Admitting: Physical Medicine & Rehabilitation

## 2011-10-31 ENCOUNTER — Other Ambulatory Visit: Payer: Medicare Other

## 2011-11-07 ENCOUNTER — Ambulatory Visit: Payer: Medicare Other | Admitting: Nurse Practitioner

## 2011-11-14 ENCOUNTER — Telehealth: Payer: Self-pay | Admitting: *Deleted

## 2011-11-14 NOTE — Telephone Encounter (Signed)
Pt no showed for post hospital and labs that were needed, Called to remind need for lab work but phone didn't have voice mail, will try again, set recall to get pt to call back also.

## 2011-11-14 NOTE — Telephone Encounter (Signed)
Left msg with contact to have her call back and set lab draw and ov, number provided.

## 2012-01-03 ENCOUNTER — Other Ambulatory Visit: Payer: Self-pay | Admitting: Family Medicine

## 2012-01-03 DIAGNOSIS — N6459 Other signs and symptoms in breast: Secondary | ICD-10-CM

## 2012-01-03 DIAGNOSIS — N63 Unspecified lump in unspecified breast: Secondary | ICD-10-CM

## 2012-01-08 ENCOUNTER — Ambulatory Visit
Admission: RE | Admit: 2012-01-08 | Discharge: 2012-01-08 | Disposition: A | Discharge status: Home / Self Care | Payer: Medicare Other | Source: Ambulatory Visit | Attending: Family Medicine | Admitting: Family Medicine

## 2012-01-08 DIAGNOSIS — N6459 Other signs and symptoms in breast: Secondary | ICD-10-CM

## 2012-01-08 DIAGNOSIS — N63 Unspecified lump in unspecified breast: Secondary | ICD-10-CM

## 2012-01-14 ENCOUNTER — Ambulatory Visit: Payer: Medicare Other | Admitting: Physical Medicine & Rehabilitation

## 2012-01-22 ENCOUNTER — Encounter: Payer: Medicare Other | Attending: Physical Medicine & Rehabilitation

## 2012-01-22 ENCOUNTER — Encounter: Payer: Self-pay | Admitting: Physical Medicine & Rehabilitation

## 2012-01-22 ENCOUNTER — Ambulatory Visit (HOSPITAL_BASED_OUTPATIENT_CLINIC_OR_DEPARTMENT_OTHER): Payer: Medicare Other | Admitting: Physical Medicine & Rehabilitation

## 2012-01-22 VITALS — BP 154/84 | HR 88 | Resp 14 | Ht 60.0 in | Wt 156.4 lb

## 2012-01-22 DIAGNOSIS — M545 Low back pain, unspecified: Secondary | ICD-10-CM

## 2012-01-22 DIAGNOSIS — M4180 Other forms of scoliosis, site unspecified: Secondary | ICD-10-CM | POA: Insufficient documentation

## 2012-01-22 DIAGNOSIS — Z5181 Encounter for therapeutic drug level monitoring: Secondary | ICD-10-CM

## 2012-01-22 DIAGNOSIS — M4310 Spondylolisthesis, site unspecified: Secondary | ICD-10-CM

## 2012-01-22 DIAGNOSIS — M48061 Spinal stenosis, lumbar region without neurogenic claudication: Secondary | ICD-10-CM | POA: Insufficient documentation

## 2012-01-22 DIAGNOSIS — R269 Unspecified abnormalities of gait and mobility: Secondary | ICD-10-CM

## 2012-01-22 DIAGNOSIS — IMO0002 Reserved for concepts with insufficient information to code with codable children: Secondary | ICD-10-CM | POA: Insufficient documentation

## 2012-01-22 DIAGNOSIS — M47817 Spondylosis without myelopathy or radiculopathy, lumbosacral region: Secondary | ICD-10-CM | POA: Insufficient documentation

## 2012-01-22 DIAGNOSIS — M4316 Spondylolisthesis, lumbar region: Secondary | ICD-10-CM

## 2012-01-22 DIAGNOSIS — R279 Unspecified lack of coordination: Secondary | ICD-10-CM | POA: Insufficient documentation

## 2012-01-22 DIAGNOSIS — M129 Arthropathy, unspecified: Secondary | ICD-10-CM | POA: Insufficient documentation

## 2012-01-22 DIAGNOSIS — Z79899 Other long term (current) drug therapy: Secondary | ICD-10-CM | POA: Insufficient documentation

## 2012-01-22 DIAGNOSIS — I69998 Other sequelae following unspecified cerebrovascular disease: Secondary | ICD-10-CM

## 2012-01-22 DIAGNOSIS — Z96649 Presence of unspecified artificial hip joint: Secondary | ICD-10-CM | POA: Insufficient documentation

## 2012-01-22 DIAGNOSIS — R209 Unspecified disturbances of skin sensation: Secondary | ICD-10-CM

## 2012-01-22 NOTE — Patient Instructions (Addendum)
Medial branch blocks  Injections Test injections to see whether radiofrequency neurotomy may benefit you  Switch to baby aspirin for 1 week prior to the injection. Stop Plavix for one week prior to the injection

## 2012-01-22 NOTE — Progress Notes (Signed)
Subjective:    Patient ID: [name], female    DOB: [birth year], 66 y.o.   MRN: [id number]  HPI 66 year old female with a complicated past medical history. She has a history of chronic back problems with long-standing lumbar spinal stenosis. She has been evaluated by orthopedic spine surgery. 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 She was advised to have an L1 to sacrum fusion however patient is not willing to pursue this option do to her other multiple medical issues. She has had stroke causing persistent right hemiparesis. Her last stroke hospitalization was at [facility]. She has not had any physical therapyFor her lumbar spine.. She has had right total hip arthroplastyWhich has been helpful. Her last MRI results are noted below. Severe multilevel facet arthropathy extending into the lower thoracic spine. She is balance problems uses a cane to ambulate. Has not gone through any type of balance training therapy Orthopedic spine surgeon also suggested potential epidural injection however the patient does not want to explore this option. She is worried that this would only provide a short lasting benefit Past surgical history is significant for coronary artery bypass graft in 2007 Pain Inventory Average Pain 5 Pain Right Now 5 My pain is intermittent, constant, sharp and stabbing  In the last 24 hours, has pain interfered with the following? General activity 5 Relation with others 5 Enjoyment of life 7 What TIME of day is your pain at its worst? morning and daytime Sleep (in general) Poor  Pain is worse with: walking and standing Pain improves with: medication Relief from Meds: 5  Mobility use a cane how many minutes can you walk? 20 ability to climb steps?  yes do you drive?  yes  Function disabled: date disabled 2001 I need assistance with the following:  shopping  Neuro/Psych numbness trouble  walking depression  Prior Studies Any changes since last visit?  no 09/14/2011 MRI LS Spine 1. No acute findings in the lumbar spine.  2. Advanced L4-L5 and L5-L1 disc and facet degeneration in the  setting of chronic scoliosis and spondylolisthesis.  Findings at L4-L5 are stable or mildly increased since 2011 and  include moderate spinal stenosis, severe right lateral recess  stenosis and bilateral foraminal stenosis. At L5-S1 there is  chronic severe left lateral recess and left foraminal stenosis.  3. No other significant spinal stenosis. Chronic moderate and  severe lower thoracic and upper lumbar facet degeneration results  in moderate to or severe bilateral T11, left T12 and right L3  foraminal stenosis.  Physicians involved in your care Any changes since last visit?  no   Family History  Problem Relation Age of Onset  . Coronary artery disease Father 55    Unclear if it was an MI  . Stroke Mother 66  . Coronary artery disease Brother 16  . Coronary artery disease Brother 72   History   Social History  . Marital Status: Divorced    Spouse Name: N/A    Number of Children: 4  . Years of Education: N/A   Occupational History  . Retired  [employer]    [employer]   Social History Main Topics  . Smoking status: Current Every Day Smoker -- 0.8 packs/day for 50 years    Types: Cigarettes  . Smokeless tobacco: Never Used  . Alcohol Use: No  . Drug Use: No  . Sexually Active: None   Other Topics Concern  . None   Social History  Narrative   Lives with grandson 34 years old.   Past Surgical History  Procedure Date  . Coronary artery bypass graft     LIMA to RI, SVG to distal RCA, SVG to LAD, SVG to OM. 2007  . Joint replacement     Right hip replacement   . Debridement skin / sq / muscle of trunk     s/p staph infection from CABG 2007  . Cystectomy     Subcutaneous  . Partial hip arthroplasty   . Stroke    Past Medical History  Diagnosis Date  . Stroke    . Coronary artery disease   . Ear infection     right  . Diabetes mellitus   . Hypertension   . 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 Asthma   . Bronchitis   . Reflux   . Hyperlipidemia   . Pneumonia   . CHF (congestive heart failure)   . GERD (gastroesophageal reflux disease)   . Arthritis    BP 154/84  Pulse 88  Resp 14  Ht 5' (1.524 m)  Wt 156 lb 6.4 oz (70.943 kg)  BMI 30.54 kg/m2  SpO2 95%    Review of Systems  Constitutional:       Has had strokes in past/ has difficulty gripping with right hand, and "can't feel temperature changes"  Cardiovascular: Positive for leg swelling.  Musculoskeletal: Positive for back pain, arthralgias and gait problem.  Neurological: Positive for numbness.  Psychiatric/Behavioral: Positive for dysphoric mood.  All other systems reviewed and are negative.       Objective:   Physical Exam  Constitutional: She is oriented to person, place, and time. She appears well-developed and well-nourished.  HENT:  Head: Normocephalic and atraumatic.  Eyes: Conjunctivae normal and EOM are normal. Pupils are equal, round, and reactive to light.  Musculoskeletal:       Lumbar back: She exhibits decreased range of motion, tenderness and pain. She exhibits no swelling and no edema.       Pain with extension of lumbar spine L4-S1 tenderness -SLR  Neurological: She is alert and oriented to person, place, and time. She has normal strength. A sensory deficit is present. She exhibits normal muscle tone. Gait abnormal.       R side sensory deficit Forward flexed gait  Psychiatric: She has a normal mood and affect.          Assessment & Plan:  1. Lumbar spinal stenosis, severe lumbar spondylosis with degenerative scoliosis and anterolisthesis worst at L4-L5. Her primary concern is her back pain. For this reason I do think medial branch blocks would be a reasonable option for her and if she benefits short-term she may respond long term to  radiofrequency neurotomy  2. Lumbar radiculopathy chronic her right-sided symptoms are difficult to evaluate due to the right hemisensory deficits from previous stroke. Both these problems are contribute to balance disorder and therefore I will refer her to physical therapy for balance training. Would like to get her pain under better control prior to the referral  3. Medication management She is RE on duloxetine as well as pregabalin for her neuropathic pain. Will trial low-dose tramadol. We'll try to avoid narcotic analgesics given stroke history and balance problems but will will get a urine drug screen in case we decide to go down this road  Discussed with patient agrees with plan. She will need to come off of Plavix and use baby aspirin for  5 days prior to her medial branch blocks

## 2012-02-07 ENCOUNTER — Ambulatory Visit (HOSPITAL_BASED_OUTPATIENT_CLINIC_OR_DEPARTMENT_OTHER): Payer: Medicare Other | Admitting: Physical Medicine & Rehabilitation

## 2012-02-07 ENCOUNTER — Encounter: Payer: Medicare Other | Attending: Physical Medicine & Rehabilitation

## 2012-02-07 ENCOUNTER — Encounter: Payer: Self-pay | Admitting: Physical Medicine & Rehabilitation

## 2012-02-07 VITALS — BP 151/63 | HR 80 | Resp 14 | Ht 60.0 in | Wt 155.4 lb

## 2012-02-07 DIAGNOSIS — IMO0002 Reserved for concepts with insufficient information to code with codable children: Secondary | ICD-10-CM | POA: Insufficient documentation

## 2012-02-07 DIAGNOSIS — M47817 Spondylosis without myelopathy or radiculopathy, lumbosacral region: Secondary | ICD-10-CM

## 2012-02-07 DIAGNOSIS — Z96649 Presence of unspecified artificial hip joint: Secondary | ICD-10-CM | POA: Insufficient documentation

## 2012-02-07 DIAGNOSIS — Z79899 Other long term (current) drug therapy: Secondary | ICD-10-CM | POA: Insufficient documentation

## 2012-02-07 DIAGNOSIS — M48061 Spinal stenosis, lumbar region without neurogenic claudication: Secondary | ICD-10-CM | POA: Insufficient documentation

## 2012-02-07 DIAGNOSIS — M129 Arthropathy, unspecified: Secondary | ICD-10-CM | POA: Insufficient documentation

## 2012-02-07 DIAGNOSIS — R279 Unspecified lack of coordination: Secondary | ICD-10-CM | POA: Insufficient documentation

## 2012-02-07 DIAGNOSIS — M4180 Other forms of scoliosis, site unspecified: Secondary | ICD-10-CM | POA: Insufficient documentation

## 2012-02-07 MED ORDER — DIAZEPAM 5 MG PO TABS: 5.0000 mg | ORAL_TABLET | Freq: Once | ORAL | Status: DC

## 2012-02-07 NOTE — Patient Instructions (Signed)
Tramadol tablets-No increase in Stroke Risk listed in any resources that I use- Claudette Laws MD What is this medicine? TRAMADOL (TRA ma dole) is a pain reliever. It is used to treat moderate to severe pain in adults. This medicine may be used for other purposes; ask your health care provider or pharmacist if you have questions. What should I tell my health care provider before I take this medicine? They need to know if you have any of these conditions: -brain tumor -depression -drug abuse or addiction -head injury -if you frequently drink alcohol containing drinks -kidney disease or trouble passing urine -liver disease -lung disease, asthma, or breathing problems -seizures or epilepsy -suicidal thoughts, plans, or attempt; a previous suicide attempt by you or a family member -an unusual or allergic reaction to tramadol, codeine, other medicines, foods, dyes, or preservatives -pregnant or trying to get pregnant -breast-feeding How should I use this medicine? Take this medicine by mouth with a full glass of water. Follow the directions on the prescription label. If the medicine upsets your stomach, take it with food or milk. Do not take more medicine than you are told to take. Talk to your pediatrician regarding the use of this medicine in children. Special care may be needed. Overdosage: If you think you have taken too much of this medicine contact a poison control center or emergency room at once. NOTE: This medicine is only for you. Do not share this medicine with others. What if I miss a dose? If you miss a dose, take it as soon as you can. If it is almost time for your next dose, take only that dose. Do not take double or extra doses. What may interact with this medicine? Do not take this medicine with any of the following medications: -MAOIs like Carbex, Eldepryl, Marplan, Nardil, and Parnate This medicine may also interact with the following medications: -alcohol or medicines  that contain alcohol -antihistamines -benzodiazepines -bupropion -carbamazepine or oxcarbazepine -clozapine -cyclobenzaprine -digoxin -furazolidone -linezolid -medicines for depression, anxiety, or psychotic disturbances -medicines for migraine headache like almotriptan, eletriptan, frovatriptan, naratriptan, rizatriptan, sumatriptan, zolmitriptan -medicines for pain like pentazocine, buprenorphine, butorphanol, meperidine, nalbuphine, and propoxyphene -medicines for sleep -muscle relaxants -naltrexone -phenobarbital -phenothiazines like perphenazine, thioridazine, chlorpromazine, mesoridazine, fluphenazine, prochlorperazine, promazine, and trifluoperazine -procarbazine -warfarin This list may not describe all possible interactions. Give your health care provider a list of all the medicines, herbs, non-prescription drugs, or dietary supplements you use. Also tell them if you smoke, drink alcohol, or use illegal drugs. Some items may interact with your medicine. What should I watch for while using this medicine? Tell your doctor or health care professional if your pain does not go away, if it gets worse, or if you have new or a different type of pain. You may develop tolerance to the medicine. Tolerance means that you will need a higher dose of the medicine for pain relief. Tolerance is normal and is expected if you take this medicine for a long time. Do not suddenly stop taking your medicine because you may develop a severe reaction. Your body becomes used to the medicine. This does NOT mean you are addicted. Addiction is a behavior related to getting and using a drug for a non-medical reason. If you have pain, you have a medical reason to take pain medicine. Your doctor will tell you how much medicine to take. If your doctor wants you to stop the medicine, the dose will be slowly lowered over time to avoid any side effects.  You may get drowsy or dizzy. Do not drive, use machinery, or do  anything that needs mental alertness until you know how this medicine affects you. Do not stand or sit up quickly, especially if you are an older patient. This reduces the risk of dizzy or fainting spells. Alcohol can increase or decrease the effects of this medicine. Avoid alcoholic drinks. You may have constipation. Try to have a bowel movement at least every 2 to 3 days. If you do not have a bowel movement for 3 days, call your doctor or health care professional. Your mouth may get dry. Chewing sugarless gum or sucking hard candy, and drinking plenty of water may help. Contact your doctor if the problem does not go away or is severe. What side effects may I notice from receiving this medicine? Side effects that you should report to your doctor or health care professional as soon as possible: -allergic reactions like skin rash, itching or hives, swelling of the face, lips, or tongue -breathing difficulties, wheezing -confusion -itching -light headedness or fainting spells -redness, blistering, peeling or loosening of the skin, including inside the mouth -seizures Side effects that usually do not require medical attention (report to your doctor or health care professional if they continue or are bothersome): -constipation -dizziness -drowsiness -headache -nausea, vomiting This list may not describe all possible side effects. Call your doctor for medical advice about side effects. You may report side effects to FDA at 1-800-FDA-1088. Where should I keep my medicine? Keep out of the reach of children. Store at room temperature between 15 and 30 degrees C (59 and 86 degrees F). Keep container tightly closed. Throw away any unused medicine after the expiration date. NOTE: This sheet is a summary. It may not cover all possible information. If you have questions about this medicine, talk to your doctor, pharmacist, or health care provider.  2012, [employer]. (01/04/2010 11:55:44 AM)

## 2012-02-07 NOTE — Progress Notes (Signed)
  PROCEDURE RECORD [facility]   Name: [name] DOB:Sep 11, 1945 MRN: [id number]  Date:02/07/2012  Physician: Claudette Laws, MD    Nurse/CMA: Shahan Starks RN  Allergies:  Allergies  Allergen Reactions  . Codeine   . Ivp Dye (Iodinated Diagnostic Agents)   . Penicillins   . Sulfa Antibiotics     Consent Signed: yes  Is patient diabetic? yes  CBG today? 137  Pregnant: no LMP: No LMP recorded. Patient is postmenopausal. (age 30-55)  Anticoagulants: yes (plavix, has been off for >7days) Anti-inflammatory: no Antibiotics: no  Procedure: Medial Branch Blocks left side Position: Prone Start Time: 1::26 End Time: 1:33  Fluoro Time:20 seconds   RN/CMA Designer, [employer]    Time 1:03 1:37    BP 151/63 150/61    Pulse 80 83    Respirations 14 14    O2 Sat 100 99    S/S 6 6    Pain Level 7/10 7/10     D/C home with friend, [name] & O X 3, D/C instructions reviewed, and sits independently.

## 2012-02-07 NOTE — Progress Notes (Signed)
Left Lumbar L3, L4  medial branch blocks and L 5 dorsal ramus injection under fluoroscopic guidance   Indication: Left Lumbar pain which is not relieved by medication management or other conservative care and interfering with self-care and mobility.  Informed consent was obtained after describing risks and benefits of the procedure with the patient, this includes bleeding, bruising, infection, paralysis and medication side effects.  The patient wishes to proceed and has given written consent.  The patient was placed in a prone position.  The lumbar area was marked and prepped with Betadine.  One mL of 1% lidocaine was injected into each of 3 areas into the skin and subcutaneous tissue.  Then a 22-gauge 3.5 spinal needle was inserted targeting the junction of the left S1 superior articular process and sacral ala junction.  Needle was advanced under fluoroscopic guidance.  Bone contact was made.  Omnipaque 180 was injected x 0.5 mL demonstrating no intravascular uptake.  Then a solution containing one mL of 4 mg per mL dexamethasone and 3 mL of 2% MPF lidocaine was injected x 0.5 mL.  Then the left L5 superior articular process in transverse process junction was targeted.  Bone contact was made.  Omnipaque 180 was injected x 0.5 mL demonstrating no intravascular uptake.  Then a solution containing one mL of 4 mg per mL dexamethasone and 3 mL of 2% MPF lidocaine was injected x 0.5 mL.  Then the left L4 superior articular process in transverse process junction was targeted.  Bone contact was made.  Omnipaque 180 was injected x 0.5 mL demonstrating no intravascular uptake.  Then a solution containing one mL of 4 mg per mL dexamethasone and 3 mL of 2% MPF lidocaine was injected x 0.5 mL.  Patient tolerated procedure well.  Post procedure instructions were given. 

## 2012-02-08 ENCOUNTER — Other Ambulatory Visit: Payer: Medicare Other

## 2012-02-19 ENCOUNTER — Ambulatory Visit: Payer: Medicare Other | Admitting: Cardiovascular Disease

## 2012-02-25 ENCOUNTER — Encounter: Payer: Self-pay | Admitting: Cardiovascular Disease

## 2012-02-27 ENCOUNTER — Telehealth: Payer: Self-pay | Admitting: Cardiovascular Disease

## 2012-02-27 NOTE — Telephone Encounter (Signed)
Chart opened by mistake

## 2012-03-13 ENCOUNTER — Ambulatory Visit: Payer: Medicare Other | Admitting: Physical Medicine & Rehabilitation

## 2012-03-13 ENCOUNTER — Encounter: Payer: Medicare Other | Attending: Physical Medicine & Rehabilitation

## 2012-03-13 DIAGNOSIS — M4180 Other forms of scoliosis, site unspecified: Secondary | ICD-10-CM | POA: Insufficient documentation

## 2012-03-13 DIAGNOSIS — IMO0002 Reserved for concepts with insufficient information to code with codable children: Secondary | ICD-10-CM | POA: Insufficient documentation

## 2012-03-13 DIAGNOSIS — Z96649 Presence of unspecified artificial hip joint: Secondary | ICD-10-CM | POA: Insufficient documentation

## 2012-03-13 DIAGNOSIS — M129 Arthropathy, unspecified: Secondary | ICD-10-CM | POA: Insufficient documentation

## 2012-03-13 DIAGNOSIS — R279 Unspecified lack of coordination: Secondary | ICD-10-CM | POA: Insufficient documentation

## 2012-03-13 DIAGNOSIS — M48061 Spinal stenosis, lumbar region without neurogenic claudication: Secondary | ICD-10-CM | POA: Insufficient documentation

## 2012-03-13 DIAGNOSIS — M47817 Spondylosis without myelopathy or radiculopathy, lumbosacral region: Secondary | ICD-10-CM | POA: Insufficient documentation

## 2012-03-13 DIAGNOSIS — Z79899 Other long term (current) drug therapy: Secondary | ICD-10-CM | POA: Insufficient documentation

## 2012-04-16 ENCOUNTER — Emergency Department (HOSPITAL_COMMUNITY): Payer: Medicare Other

## 2012-04-16 ENCOUNTER — Encounter (HOSPITAL_COMMUNITY): Payer: Self-pay | Admitting: Internal Medicine

## 2012-04-16 ENCOUNTER — Inpatient Hospital Stay (HOSPITAL_COMMUNITY)
Admission: EM | Admit: 2012-04-16 | Discharge: 2012-04-17 | DRG: 312 | Disposition: A | Discharge status: Home / Self Care | Payer: Medicare Other | Attending: Family Medicine | Admitting: Family Medicine

## 2012-04-16 DIAGNOSIS — I509 Heart failure, unspecified: Secondary | ICD-10-CM

## 2012-04-16 DIAGNOSIS — Z96649 Presence of unspecified artificial hip joint: Secondary | ICD-10-CM

## 2012-04-16 DIAGNOSIS — E119 Type 2 diabetes mellitus without complications: Secondary | ICD-10-CM | POA: Diagnosis present

## 2012-04-16 DIAGNOSIS — E1139 Type 2 diabetes mellitus with other diabetic ophthalmic complication: Secondary | ICD-10-CM

## 2012-04-16 DIAGNOSIS — I5042 Chronic combined systolic (congestive) and diastolic (congestive) heart failure: Secondary | ICD-10-CM | POA: Diagnosis present

## 2012-04-16 DIAGNOSIS — R270 Ataxia, unspecified: Secondary | ICD-10-CM

## 2012-04-16 DIAGNOSIS — Z882 Allergy status to sulfonamides status: Secondary | ICD-10-CM

## 2012-04-16 DIAGNOSIS — M129 Arthropathy, unspecified: Secondary | ICD-10-CM | POA: Diagnosis present

## 2012-04-16 DIAGNOSIS — E113299 Type 2 diabetes mellitus with mild nonproliferative diabetic retinopathy without macular edema, unspecified eye: Secondary | ICD-10-CM

## 2012-04-16 DIAGNOSIS — I951 Orthostatic hypotension: Principal | ICD-10-CM | POA: Diagnosis present

## 2012-04-16 DIAGNOSIS — I2510 Atherosclerotic heart disease of native coronary artery without angina pectoris: Secondary | ICD-10-CM | POA: Diagnosis present

## 2012-04-16 DIAGNOSIS — I10 Essential (primary) hypertension: Secondary | ICD-10-CM | POA: Diagnosis present

## 2012-04-16 DIAGNOSIS — Z880 Allergy status to penicillin: Secondary | ICD-10-CM

## 2012-04-16 DIAGNOSIS — Z951 Presence of aortocoronary bypass graft: Secondary | ICD-10-CM

## 2012-04-16 DIAGNOSIS — Z91041 Radiographic dye allergy status: Secondary | ICD-10-CM

## 2012-04-16 DIAGNOSIS — R531 Weakness: Secondary | ICD-10-CM | POA: Diagnosis present

## 2012-04-16 DIAGNOSIS — F17200 Nicotine dependence, unspecified, uncomplicated: Secondary | ICD-10-CM | POA: Diagnosis present

## 2012-04-16 DIAGNOSIS — E11329 Type 2 diabetes mellitus with mild nonproliferative diabetic retinopathy without macular edema: Secondary | ICD-10-CM

## 2012-04-16 DIAGNOSIS — R0789 Other chest pain: Secondary | ICD-10-CM

## 2012-04-16 DIAGNOSIS — J45909 Unspecified asthma, uncomplicated: Secondary | ICD-10-CM | POA: Diagnosis present

## 2012-04-16 DIAGNOSIS — Z8673 Personal history of transient ischemic attack (TIA), and cerebral infarction without residual deficits: Secondary | ICD-10-CM

## 2012-04-16 DIAGNOSIS — Z79899 Other long term (current) drug therapy: Secondary | ICD-10-CM

## 2012-04-16 DIAGNOSIS — E860 Dehydration: Secondary | ICD-10-CM | POA: Diagnosis present

## 2012-04-16 DIAGNOSIS — Z886 Allergy status to analgesic agent status: Secondary | ICD-10-CM

## 2012-04-16 DIAGNOSIS — J9600 Acute respiratory failure, unspecified whether with hypoxia or hypercapnia: Secondary | ICD-10-CM

## 2012-04-16 DIAGNOSIS — I5043 Acute on chronic combined systolic (congestive) and diastolic (congestive) heart failure: Secondary | ICD-10-CM

## 2012-04-16 DIAGNOSIS — R5383 Other fatigue: Secondary | ICD-10-CM

## 2012-04-16 DIAGNOSIS — I5022 Chronic systolic (congestive) heart failure: Secondary | ICD-10-CM | POA: Diagnosis present

## 2012-04-16 DIAGNOSIS — Z8701 Personal history of pneumonia (recurrent): Secondary | ICD-10-CM

## 2012-04-16 DIAGNOSIS — I639 Cerebral infarction, unspecified: Secondary | ICD-10-CM

## 2012-04-16 DIAGNOSIS — I252 Old myocardial infarction: Secondary | ICD-10-CM

## 2012-04-16 DIAGNOSIS — Z7902 Long term (current) use of antithrombotics/antiplatelets: Secondary | ICD-10-CM

## 2012-04-16 DIAGNOSIS — R279 Unspecified lack of coordination: Secondary | ICD-10-CM | POA: Diagnosis present

## 2012-04-16 DIAGNOSIS — Z720 Tobacco use: Secondary | ICD-10-CM

## 2012-04-16 DIAGNOSIS — N179 Acute kidney failure, unspecified: Secondary | ICD-10-CM | POA: Diagnosis present

## 2012-04-16 DIAGNOSIS — E785 Hyperlipidemia, unspecified: Secondary | ICD-10-CM | POA: Diagnosis present

## 2012-04-16 DIAGNOSIS — K219 Gastro-esophageal reflux disease without esophagitis: Secondary | ICD-10-CM | POA: Diagnosis present

## 2012-04-16 LAB — TROPONIN I: Troponin I: <0.3 ng/mL (ref <0.30)

## 2012-04-16 LAB — BASIC METABOLIC PANEL
CO2: 30 mEq/L (ref 19–32)
Calcium: 9.5 mg/dL (ref 8.4–10.5)
Creatinine, Ser: 1.42 mg/dL — ABNORMAL HIGH (ref 0.50–1.10)
GFR calc non Af Amer: 38 mL/min — ABNORMAL LOW (ref >90)
Sodium: 134 mEq/L — ABNORMAL LOW (ref 135–145)

## 2012-04-16 LAB — CBC
MCH: 29.8 pg (ref 26.0–34.0)
MCHC: 33.6 g/dL (ref 30.0–36.0)
MCV: 88.6 fL (ref 78.0–100.0)
Platelets: 172 10*3/uL (ref 150–400)
RBC: 4.84 MIL/uL (ref 3.87–5.11)
RDW: 13.1 % (ref 11.5–15.5)

## 2012-04-16 LAB — PROTIME-INR: Prothrombin Time: 13.4 seconds (ref 11.6–15.2)

## 2012-04-16 MED ORDER — NITROGLYCERIN 0.4 MG SL SUBL: 0.4000 mg | SUBLINGUAL_TABLET | SUBLINGUAL | Status: DC | PRN

## 2012-04-16 MED ORDER — ACETAMINOPHEN 650 MG RE SUPP: 650.0000 mg | Freq: Four times a day (QID) | RECTAL | Status: DC | PRN

## 2012-04-16 MED ORDER — INSULIN ASPART 100 UNIT/ML ~~LOC~~ SOLN
0.0000 [IU] | Freq: Three times a day (TID) | SUBCUTANEOUS | Status: DC
  Administered 2012-04-17: 1 [IU] via SUBCUTANEOUS
  Administered 2012-04-17: 3 [IU] via SUBCUTANEOUS

## 2012-04-16 MED ORDER — DULOXETINE HCL 60 MG PO CPEP
60.0000 mg | ORAL_CAPSULE | Freq: Every day | ORAL | Status: DC
  Administered 2012-04-17: 60 mg via ORAL
  Filled 2012-04-16: qty 1

## 2012-04-16 MED ORDER — ACETAMINOPHEN 325 MG PO TABS: 650.0000 mg | ORAL_TABLET | Freq: Four times a day (QID) | ORAL | Status: DC | PRN

## 2012-04-16 MED ORDER — BUDESONIDE-FORMOTEROL FUMARATE 160-4.5 MCG/ACT IN AERO
2.0000 | INHALATION_SPRAY | Freq: Two times a day (BID) | RESPIRATORY_TRACT | Status: DC
  Administered 2012-04-16: 2 via RESPIRATORY_TRACT
  Filled 2012-04-16: qty 6

## 2012-04-16 MED ORDER — ATORVASTATIN CALCIUM 80 MG PO TABS
80.0000 mg | ORAL_TABLET | Freq: Every day | ORAL | Status: DC
  Filled 2012-04-16: qty 1

## 2012-04-16 MED ORDER — SODIUM CHLORIDE 0.9 % IJ SOLN: 3.0000 mL | Freq: Two times a day (BID) | INTRAMUSCULAR | Status: DC

## 2012-04-16 MED ORDER — PANTOPRAZOLE SODIUM 40 MG PO TBEC
40.0000 mg | DELAYED_RELEASE_TABLET | Freq: Every day | ORAL | Status: DC
  Administered 2012-04-17: 40 mg via ORAL
  Filled 2012-04-16: qty 1

## 2012-04-16 MED ORDER — SODIUM CHLORIDE 0.9 % IJ SOLN
3.0000 mL | Freq: Two times a day (BID) | INTRAMUSCULAR | Status: DC
  Administered 2012-04-16 – 2012-04-17 (×2): 3 mL via INTRAVENOUS

## 2012-04-16 MED ORDER — CARVEDILOL 12.5 MG PO TABS
12.5000 mg | ORAL_TABLET | Freq: Two times a day (BID) | ORAL | Status: DC
  Administered 2012-04-17: 12.5 mg via ORAL
  Filled 2012-04-16 (×3): qty 1

## 2012-04-16 MED ORDER — ONDANSETRON HCL 4 MG/2ML IJ SOLN: 4.0000 mg | Freq: Four times a day (QID) | INTRAMUSCULAR | Status: DC | PRN

## 2012-04-16 MED ORDER — ALPRAZOLAM 0.5 MG PO TABS: 0.5000 mg | ORAL_TABLET | Freq: Every day | ORAL | Status: DC | PRN

## 2012-04-16 MED ORDER — ONDANSETRON HCL 4 MG PO TABS: 4.0000 mg | ORAL_TABLET | Freq: Four times a day (QID) | ORAL | Status: DC | PRN

## 2012-04-16 MED ORDER — PREGABALIN 25 MG PO CAPS
50.0000 mg | ORAL_CAPSULE | Freq: Two times a day (BID) | ORAL | Status: DC
  Administered 2012-04-17 (×2): 50 mg via ORAL
  Filled 2012-04-16 (×2): qty 2

## 2012-04-16 MED ORDER — ALBUTEROL SULFATE HFA 108 (90 BASE) MCG/ACT IN AERS: 2.0000 | INHALATION_SPRAY | Freq: Four times a day (QID) | RESPIRATORY_TRACT | Status: DC | PRN

## 2012-04-16 MED ORDER — CLOPIDOGREL BISULFATE 75 MG PO TABS
75.0000 mg | ORAL_TABLET | Freq: Every day | ORAL | Status: DC
  Administered 2012-04-17: 75 mg via ORAL
  Filled 2012-04-16 (×2): qty 1

## 2012-04-16 NOTE — ED Notes (Signed)
Pt inquiring about plan of care.  MD Powers notified that pt does not wish to get admitted.

## 2012-04-16 NOTE — ED Notes (Signed)
Pt from [place] MD.  Pt has had bronchitis for five weeks and weakness for five weeks.  Pt had abnormal EKG.  Pt had bilat anterior neck pain few days ago similar to last MI.

## 2012-04-16 NOTE — ED Notes (Signed)
Pt ambulated to RR with NT and cane.

## 2012-04-16 NOTE — ED Notes (Signed)
Heart healthy dinner tray ordered for pt. 

## 2012-04-16 NOTE — ED Notes (Signed)
Returned from CT.

## 2012-04-16 NOTE — ED Notes (Signed)
MD at bedside. 

## 2012-04-16 NOTE — ED Notes (Signed)
Patient transported to CT 

## 2012-04-16 NOTE — ED Provider Notes (Signed)
History     CSN: [id number]  Arrival date & time 04/16/12  1309   First MD Initiated Contact with Patient 04/16/12 1327      Chief Complaint  Patient presents with  . Weakness    (Consider location/radiation/quality/duration/timing/severity/associated sxs/prior treatment) HPI Comments: [name] presents for evaluation from her PMD's office.  She reports a generalizwed weakness ofver the last 2 weeks.  She states she has been stumbling and feels as if she can not walk straight.  She denies unilateral weakness.  She has had recent jaw pain similar to discomfort experienced prior to her last MI.  She denies active pain.   Patient is a 65 y.o. female presenting with weakness. The history is provided by the patient. No language interpreter was used.  Weakness The primary symptoms include dizziness and paresthesias. Primary symptoms do not include headaches, syncope, loss of consciousness, altered mental status, seizures, visual change, focal weakness, loss of sensation, speech change, memory loss, fever, nausea or vomiting. The symptoms began more than 1 week ago. The symptoms are waxing and waning. The neurological symptoms are diffuse.  Dizziness also occurs with weakness. Dizziness does not occur with tinnitus, nausea or vomiting.   Additional symptoms include weakness and vertigo. Additional symptoms do not include neck stiffness, pain, lower back pain, leg pain, loss of balance, photophobia, aura, hallucinations, nystagmus, taste disturbance, hyperacusis, hearing loss, tinnitus, anxiety, irritability or dysphoric mood. Medical issues also include diabetes and hypertension. Medical issues do not include cerebral vascular accident, alcohol use, drug use or recent surgery.    Past Medical History  Diagnosis Date  . Stroke   . Coronary artery disease   . Ear infection     right  . Diabetes mellitus   . Hypertension   . 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 Asthma   .  Bronchitis   . Reflux   . Hyperlipidemia   . Pneumonia   . CHF (congestive heart failure)   . GERD (gastroesophageal reflux disease)   . Arthritis     Past Surgical History  Procedure Date  . Coronary artery bypass graft     LIMA to RI, SVG to distal RCA, SVG to LAD, SVG to OM. 2007  . Joint replacement     Right hip replacement   . Debridement skin / sq / muscle of trunk     s/p staph infection from CABG 2007  . Cystectomy     Subcutaneous  . Partial hip arthroplasty   . Stroke     Family History  Problem Relation Age of Onset  . Coronary artery disease Father 68    Unclear if it was an MI  . Stroke Mother 69  . Coronary artery disease Brother 29  . Coronary artery disease Brother 31    History  Substance Use Topics  . Smoking status: Current Every Day Smoker -- 0.8 packs/day for 50 years    Types: Cigarettes  . Smokeless tobacco: Never Used  . Alcohol Use: No    OB History    Grav Para Term Preterm Abortions TAB SAB Ect Mult Living                  Review of Systems  Constitutional: Negative for fever and irritability.  HENT: Negative for hearing loss, neck stiffness and tinnitus.   Eyes: Negative for photophobia.  Cardiovascular: Negative for syncope.  Gastrointestinal: Negative for nausea and vomiting.  Neurological: Positive for dizziness, vertigo, weakness and  paresthesias. Negative for speech change, focal weakness, seizures, loss of consciousness, headaches and loss of balance.  Psychiatric/Behavioral: Negative for hallucinations, memory loss, dysphoric mood and altered mental status.    Allergies  Codeine; Ivp dye; Penicillins; and Sulfa antibiotics  Home Medications   Current Outpatient Rx  Name  Route  Sig  Dispense  Refill  . ALBUTEROL SULFATE HFA 108 (90 BASE) MCG/ACT IN AERS   Inhalation   Inhale 2 puffs into the lungs every 6 (six) hours as needed. For shortness of breath/wheezing         . ALPRAZOLAM 0.5 MG PO TABS   Oral   Take  0.5 mg by mouth daily as needed. For anxiety         . BUDESONIDE-FORMOTEROL FUMARATE 160-4.5 MCG/ACT IN AERO   Inhalation   Inhale 2 puffs into the lungs 2 (two) times daily.          Marland Kitchen CANAGLIFLOZIN 100 MG PO TABS   Oral   Take 100 mg by mouth every evening.         Marland Kitchen CARVEDILOL 12.5 MG PO TABS   Oral   Take 1 tablet (12.5 mg total) by mouth every 12 (twelve) hours.   60 tablet   3   . CLOPIDOGREL BISULFATE 75 MG PO TABS   Oral   Take 75 mg by mouth daily.         Marland Kitchen DIPHENHYDRAMINE HCL 25 MG PO CAPS   Oral   Take 25 mg by mouth every 6 (six) hours as needed. For allergies         . DULOXETINE HCL 60 MG PO CPEP   Oral   Take 60 mg by mouth daily.         . FUROSEMIDE 40 MG PO TABS   Oral   Take 1 tablet (40 mg total) by mouth 2 (two) times daily.   60 tablet   3   . LISINOPRIL 20 MG PO TABS   Oral   Take 20 mg by mouth 2 (two) times daily.          Marland Kitchen NITROGLYCERIN 0.4 MG SL SUBL   Sublingual   Place 0.4 mg under the tongue every 5 (five) minutes as needed. For chest pain.         Marland Kitchen PANTOPRAZOLE SODIUM 40 MG PO TBEC   Oral   Take 40 mg by mouth daily.         Marland Kitchen POTASSIUM CHLORIDE ER 10 MEQ PO TBCR   Oral   Take 10 mEq by mouth daily.         Marland Kitchen PREGABALIN 50 MG PO CAPS   Oral   Take 50 mg by mouth 2 (two) times daily.         Marland Kitchen ROSUVASTATIN CALCIUM 40 MG PO TABS   Oral   Take 40 mg by mouth daily.         Marland Kitchen SPIRONOLACTONE 25 MG PO TABS   Oral   Take 12.5 mg by mouth daily.           BP 133/71  Pulse 78  Temp 97.8 F (36.6 C) (Axillary)  Resp 28  SpO2 91%  Physical Exam  Nursing note and vitals reviewed. Constitutional: She is oriented to person, place, and time. She appears well-developed and well-nourished. No distress.  HENT:  Head: Normocephalic and atraumatic.  Right Ear: External ear normal.  Left Ear: External ear normal.  Nose: Nose normal.  Mouth/Throat:  Oropharynx is clear and moist. No oropharyngeal  exudate.  Eyes: Conjunctivae normal and EOM are normal. Pupils are equal, round, and reactive to light. Right eye exhibits no discharge. Left eye exhibits no discharge. No scleral icterus.       No nystagmus   Neck: Normal range of motion. Neck supple. No JVD present. No tracheal deviation present.  Cardiovascular: Normal rate, regular rhythm, normal heart sounds and intact distal pulses.  Exam reveals no gallop and no friction rub.   No murmur heard. Pulmonary/Chest: Effort normal and breath sounds normal. No stridor. No respiratory distress. She has no wheezes. She has no rales. She exhibits no tenderness.  Abdominal: Soft. Bowel sounds are normal. She exhibits no distension and no mass. There is no tenderness. There is no rebound and no guarding.  Musculoskeletal: Normal range of motion. She exhibits no edema and no tenderness.  Lymphadenopathy:    She has no cervical adenopathy.  Neurological: She is alert and oriented to person, place, and time. She has normal strength. She displays no atrophy and no tremor. No cranial nerve deficit or sensory deficit. She exhibits normal muscle tone. She displays a negative Romberg sign. She displays no seizure activity. Coordination normal. GCS eye subscore is 4. GCS verbal subscore is 5. GCS motor subscore is 6. She displays no Babinski's sign on the right side. She displays no Babinski's sign on the left side.  Skin: Skin is warm and dry. No rash noted. She is not diaphoretic. No erythema. No pallor.  Psychiatric: She has a normal mood and affect. Her behavior is normal.    ED Course  Procedures (including critical care time)  Labs Reviewed  BASIC METABOLIC PANEL - Abnormal; Notable for the following:    Sodium 134 (*)     Chloride 93 (*)     Glucose, Bld 170 (*)     BUN 36 (*)     Creatinine, Ser 1.42 (*)     GFR calc non Af Amer 38 (*)     GFR calc Af Amer 44 (*)     All other components within normal limits  CBC  TROPONIN I  PROTIME-INR    Ct Head Wo Contrast  04/16/2012  *RADIOLOGY REPORT*  Clinical Data: Weakness, imbalance, past history of stroke, coronary disease post MI, hypertension, CHF, GERD, asthma, bronchitis  CT HEAD WITHOUT CONTRAST  Technique:  Contiguous axial images were obtained from the base of the skull through the vertex without contrast.  Comparison: 05/21/2011  Findings: Generalized atrophy. Normal ventricular morphology. No midline shift or mass effect. Small vessel chronic ischemic changes of deep cerebral white matter. No intracranial hemorrhage, mass lesion, or acute infarction. Visualized paranasal sinuses and mastoid air cells clear. Bones unremarkable. Atherosclerotic calcifications in the vertebral and internal carotid arteries at skull base. Bones and sinuses unremarkable.  IMPRESSION: Atrophy with small vessel chronic ischemic changes of deep cerebral white matter. No acute intracranial abnormalities.   Original Report Authenticated By: Ulyses Southward, M.D.    Dg Chest Port 1 View  04/16/2012  *RADIOLOGY REPORT*  Clinical Data: Weakness  PORTABLE CHEST - 1 VIEW  Comparison: 03/25/2012  Findings: Prior CABG.  Heart size is enlarged.  Negative for heart failure.  Negative for infiltrate or effusion.  No mass lesion.  IMPRESSION: No acute cardiopulmonary disease.   Original Report Authenticated By: Janeece Riggers, M.D.      No diagnosis found.   Date: 04/16/2012  Rate: 71 bpm  Rhythm: sinus  QRS Axis: left  Intervals: PR prolonged  ST/T Wave abnormalities: lateral t wave inversions, diffuse nonspecific ST changes.  Conduction Disutrbances:first-degree A-V block   Narrative Interpretation:   Old EKG Reviewed: unchanged      MDM  Pt presents for evaluation of nonspecific weakness, intermittent ataxia, and discomfort similar to prior to her last MI.  She appears comfortable, note stable VS, NAD.  She has no new EKG changes. Will obtain basic labs, trop, CXR, head CT, and U/A.  Will review the results as  available.  She describes some symptoms like vertigo but I cannot reproduce them on exam.  Anticipate admission for further evaluation.        Tobin Chad, MD 04/16/12 [phone]

## 2012-04-16 NOTE — ED Notes (Signed)
Paged [facility] to [phone]

## 2012-04-16 NOTE — ED Notes (Signed)
EKG done at 1315.

## 2012-04-16 NOTE — ED Notes (Signed)
Pt aware of plan to admit

## 2012-04-16 NOTE — ED Notes (Signed)
Pt eating dinner, family at bedside.

## 2012-04-17 DIAGNOSIS — R279 Unspecified lack of coordination: Secondary | ICD-10-CM

## 2012-04-17 LAB — GLUCOSE, CAPILLARY
Glucose-Capillary: 144 mg/dL — ABNORMAL HIGH (ref 70–99)
Glucose-Capillary: 204 mg/dL — ABNORMAL HIGH (ref 70–99)

## 2012-04-17 MED ORDER — FUROSEMIDE 40 MG PO TABS: 40.0000 mg | ORAL_TABLET | Freq: Every day | ORAL | Status: DC

## 2012-04-17 MED ORDER — CARVEDILOL 12.5 MG PO TABS: 6.2500 mg | ORAL_TABLET | Freq: Two times a day (BID) | ORAL | Status: DC

## 2012-04-17 MED ORDER — BUDESONIDE-FORMOTEROL FUMARATE 160-4.5 MCG/ACT IN AERO
2.0000 | INHALATION_SPRAY | Freq: Two times a day (BID) | RESPIRATORY_TRACT | Status: DC
  Administered 2012-04-17: 2 via RESPIRATORY_TRACT

## 2012-04-17 NOTE — H&P (Signed)
[name] is an 66 y.o. female.  Patient was seen and examined on April 16, 2012. PCP - Dr. Ricci Barker of [facility].  Chief Complaint: Weakness. HPI: 66 year old female with history of combined systolic and diastolic heart failure last EF measured this June was 15-20% and at that time patient also had a stress Myoview which was negative for ischemia was referred to the ER patient was complaining of weakness. Patient states that she's been feeling weak for last 2-3 weeks. In the PCPs office he was found to have mild hypotension and also EKG that was abnormal. EKG done in the ER shows first degree AV block with nonspecific ST changes. Cardiac enzymes were negative CT head was negative for any acute chest x-ray does not show any acute and labs show mildly increased creatinine. Patient on admission was mildly hypotensive which improved on lying down. At this time patient has been admitted for further management. Patient denies any chest pain or shortness of breath denies any nausea vomiting abdominal pain diarrhea fever chills. Denies any focal deficits. Patient was just recently started on new medication for diabetes called Canaglifloxin but her symptoms were present even before starting that.  Past Medical History  Diagnosis Date  . Stroke   . Coronary artery disease   . Ear infection     right  . Diabetes mellitus   . Hypertension   . 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 Asthma   . Bronchitis   . Reflux   . Hyperlipidemia   . Pneumonia   . CHF (congestive heart failure)   . GERD (gastroesophageal reflux disease)   . Arthritis     Past Surgical History  Procedure Date  . Coronary artery bypass graft     LIMA to RI, SVG to distal RCA, SVG to LAD, SVG to OM. 2007  . Joint replacement     Right hip replacement   . Debridement skin / sq / muscle of trunk     s/p staph infection from CABG 2007  . Cystectomy     Subcutaneous  . Partial hip arthroplasty   . Stroke      Family History  Problem Relation Age of Onset  . Coronary artery disease Father 49    Unclear if it was an MI  . Stroke Mother 7  . Coronary artery disease Brother 55  . Coronary artery disease Brother 72   Social History:  reports that she has been smoking Cigarettes.  She has a 40 pack-year smoking history. She has never used smokeless tobacco. She reports that she does not drink alcohol or use illicit drugs.  Allergies:  Allergies  Allergen Reactions  . Codeine   . Ivp Dye (Iodinated Diagnostic Agents)   . Penicillins   . Sulfa Antibiotics     Medications Prior to Admission  Medication Sig Dispense Refill  . albuterol (PROVENTIL HFA;VENTOLIN HFA) 108 (90 BASE) MCG/ACT inhaler Inhale 2 puffs into the lungs every 6 (six) hours as needed. For shortness of breath/wheezing      . ALPRAZolam (XANAX) 0.5 MG tablet Take 0.5 mg by mouth daily as needed. For anxiety      . budesonide-formoterol (SYMBICORT) 160-4.5 MCG/ACT inhaler Inhale 2 puffs into the lungs 2 (two) times daily.       . Canagliflozin (INVOKANA) 100 MG TABS Take 100 mg by mouth every evening.      . carvedilol (COREG) 12.5 MG tablet Take 1 tablet (12.5 mg total)  by mouth every 12 (twelve) hours.  60 tablet  3  . clopidogrel (PLAVIX) 75 MG tablet Take 75 mg by mouth daily.      . diphenhydrAMINE (BENADRYL) 25 mg capsule Take 25 mg by mouth every 6 (six) hours as needed. For allergies      . DULoxetine (CYMBALTA) 60 MG capsule Take 60 mg by mouth daily.      . furosemide (LASIX) 40 MG tablet Take 1 tablet (40 mg total) by mouth 2 (two) times daily.  60 tablet  3  . lisinopril (PRINIVIL,ZESTRIL) 20 MG tablet Take 20 mg by mouth 2 (two) times daily.       . nitroGLYCERIN (NITROSTAT) 0.4 MG SL tablet Place 0.4 mg under the tongue every 5 (five) minutes as needed. For chest pain.      . pantoprazole (PROTONIX) 40 MG tablet Take 40 mg by mouth daily.      . potassium chloride (K-DUR) 10 MEQ tablet Take 10 mEq by mouth  daily.      . pregabalin (LYRICA) 50 MG capsule Take 50 mg by mouth 2 (two) times daily.      . rosuvastatin (CRESTOR) 40 MG tablet Take 40 mg by mouth daily.      Marland Kitchen spironolactone (ALDACTONE) 25 MG tablet Take 12.5 mg by mouth daily.        Results for orders placed during the hospital encounter of 04/16/12 (from the past 48 hour(s))  CBC     Status: Normal   Collection Time   04/16/12  2:16 PM      Component Value Range Comment   WBC 9.6  4.0 - 10.5 K/uL    RBC 4.84  3.87 - 5.11 MIL/uL    Hemoglobin 14.4  12.0 - 15.0 g/dL    HCT 16.1  09.6 - 04.5 %    MCV 88.6  78.0 - 100.0 fL    MCH 29.8  26.0 - 34.0 pg    MCHC 33.6  30.0 - 36.0 g/dL    RDW 40.9  81.1 - 91.4 %    Platelets 172  150 - 400 K/uL   BASIC METABOLIC PANEL     Status: Abnormal   Collection Time   04/16/12  2:16 PM      Component Value Range Comment   Sodium 134 (*) 135 - 145 mEq/L    Potassium 4.4  3.5 - 5.1 mEq/L    Chloride 93 (*) 96 - 112 mEq/L    CO2 30  19 - 32 mEq/L    Glucose, Bld 170 (*) 70 - 99 mg/dL    BUN 36 (*) 6 - 23 mg/dL    Creatinine, Ser 7.82 (*) 0.50 - 1.10 mg/dL    Calcium 9.5  8.4 - 95.6 mg/dL    GFR calc non Af Amer 38 (*) >90 mL/min    GFR calc Af Amer 44 (*) >90 mL/min   TROPONIN I     Status: Normal   Collection Time   04/16/12  2:16 PM      Component Value Range Comment   Troponin I <0.30  <0.30 ng/mL   PROTIME-INR     Status: Normal   Collection Time   04/16/12  2:16 PM      Component Value Range Comment   Prothrombin Time 13.4  11.6 - 15.2 seconds    INR 1.03  0.00 - 1.49    Ct Head Wo Contrast  04/16/2012  *RADIOLOGY REPORT*  Clinical Data: Weakness, imbalance,  past history of stroke, coronary disease post MI, hypertension, CHF, GERD, asthma, bronchitis  CT HEAD WITHOUT CONTRAST  Technique:  Contiguous axial images were obtained from the base of the skull through the vertex without contrast.  Comparison: 05/21/2011  Findings: Generalized atrophy. Normal ventricular morphology.  No midline shift or mass effect. Small vessel chronic ischemic changes of deep cerebral white matter. No intracranial hemorrhage, mass lesion, or acute infarction. Visualized paranasal sinuses and mastoid air cells clear. Bones unremarkable. Atherosclerotic calcifications in the vertebral and internal carotid arteries at skull base. Bones and sinuses unremarkable.  IMPRESSION: Atrophy with small vessel chronic ischemic changes of deep cerebral white matter. No acute intracranial abnormalities.   Original Report Authenticated By: Ulyses Southward, M.D.    Dg Chest Port 1 View  04/16/2012  *RADIOLOGY REPORT*  Clinical Data: Weakness  PORTABLE CHEST - 1 VIEW  Comparison: 03/25/2012  Findings: Prior CABG.  Heart size is enlarged.  Negative for heart failure.  Negative for infiltrate or effusion.  No mass lesion.  IMPRESSION: No acute cardiopulmonary disease.   Original Report Authenticated By: Janeece Riggers, M.D.     Review of Systems  HENT: Negative.   Eyes: Negative.   Respiratory: Negative.   Cardiovascular: Negative.   Gastrointestinal: Negative.   Genitourinary: Negative.   Musculoskeletal: Negative.   Skin: Negative.   Neurological: Positive for dizziness and weakness.  Endo/Heme/Allergies: Negative.   Psychiatric/Behavioral: Negative.     Blood pressure 142/67, pulse 74, temperature 98.1 F (36.7 C), temperature source Oral, resp. rate 20, height 5' (1.524 m), weight 68.901 kg (151 lb 14.4 oz), SpO2 93.00%. Physical Exam  Constitutional: She is oriented to person, place, and time. She appears well-developed and well-nourished. No distress.  HENT:  Head: Normocephalic and atraumatic.  Right Ear: External ear normal.  Left Ear: External ear normal.  Nose: Nose normal.  Mouth/Throat: Oropharynx is clear and moist. No oropharyngeal exudate.  Eyes: Conjunctivae normal are normal. Pupils are equal, round, and reactive to light. Right eye exhibits no discharge. Left eye exhibits no discharge. No  scleral icterus.  Neck: Normal range of motion. Neck supple.  Cardiovascular: Normal rate and regular rhythm.   Respiratory: Effort normal and breath sounds normal. No respiratory distress. She has no wheezes. She has no rales.  GI: Soft. Bowel sounds are normal. She exhibits no distension. There is no tenderness. There is no rebound.  Musculoskeletal: She exhibits no edema and no tenderness.  Neurological: She is alert and oriented to person, place, and time.       Moves all extremities 5/5.   Skin: Skin is warm and dry. She is not diaphoretic.     Assessment/Plan #1. Generalized weakness - most likely secondary to hypotension and dehydration. We will hold patient's lisinopril and diuretics for now. Check orthostatics in a.m. If still continuing to be orthostatic may gently hydrate. At this time patient does not want any MRI brain or IV fluids. #2. Acute renal failure - probably secondary to dehydration and hypotension which I think will improve with holding Lasix and lisinopril. Closely follow intake output. #3. Combined systolic and diastolic heart failure - since patient clinically looks dehydrated and mildly hypotensive at this time we are holding off diuretics and antihypertensives. #4. CAD status post CABG - denies any chest pain troponins are negative. Continue antiplatelets. #5. Diabetes mellitus type 2 - patient is placed on sliding-scale coverage. #6. History of CVA. #7. History of hypertension - see #3.  CODE STATUS - full code.  Yaniyah Koors N. 04/17/2012, 1:18 AM

## 2012-04-17 NOTE — Progress Notes (Signed)
Ortho [id number] 128/68, 65; sitting 99/57, 69; standing 105/51,83; standing 3 min, 99/54, 79, Lavonda Jumbo RN

## 2012-04-17 NOTE — Discharge Summary (Signed)
Physician Discharge Summary  [name] ZOX:[id number] DOB: 10/14/1945 DOA: 04/16/2012  PCP: Ancil Boozer, MD  Admit date: 04/16/2012 Discharge date: 04/17/2012  Time spent: 35 minutes  Recommendations for Outpatient Follow-up:  1.  needs re-evaluation of blood pressure.  mutliple medicines adjusted as an in-patient 2. recommend close cardiology follow-up  Discharge Diagnoses:  Principal Problem:  *Weakness generalized Active Problems:  CAD (coronary artery disease)  HTN (hypertension)  Chronic systolic congestive heart failure   Discharge Condition: fair  Diet recommendation: heart healthy  Filed Weights   04/16/12 2259 04/17/12 0439  Weight: 151 lb 14.4 oz (68.901 kg) 151 lb 10.8 oz (68.8 kg)    History of present illness:  66 year old female with history of combined systolic and diastolic heart failure last EF measured this June was 15-20% and at that time patient also had a stress Myoview which was negative for ischemia was referred to the ER patient was complaining of weakness. Patient states that she's been feeling weak for last 2-3 weeks. In the PCPs office he was found to have mild hypotension and also EKG that was abnormal. EKG done in the ER shows first degree AV block with nonspecific ST changes. Cardiac enzymes were negative CT head was negative for any acute chest x-ray does not show any acute and labs show mildly increased creatinine. Patient on admission was mildly hypotensive which improved on lying down. At this time patient has been admitted for further management   Hospital Course:  Patient was overtly hypo-or tense and orthostatic vital signs suggestive of orthostatic hypotension It was recommended to the patient that we would adjust some of the blood pressure medicines in the hospital. Patient recently started seeing Dr. Sharl Ma of endocrinology and was placed on Invokana, which I recommended the patient stop taking because it has deteriorates effect on  the blood pressure. It also seems to be a DEP for inhibitor and piece such as rosiglitazone have had deleterious effects on heart function. Patient really wished to go home today subsequent to being admitted and although I did urge her to go home on 04/18/2012 so that we could get a baseline for blood pressures and for orthostasis, patient did not wish to stay.  Have recommended to her to followup closely with Dr. Ricci Barker and also her regular cardiologist  Chart reviewed as below-  Admission 10/20/2011 for acute respiratory failure secondary to acute on chronic combine CHF-2-D echocardiogram showed EF of 15-20%-Myoview stress test showed old anterior apical MI with LV EF 31%, this was thought to be a lung nodule  Admission 05/18/2011 for recurrent right acute she makes cerebral infarct-recommended Plavix at that time  Admission 01/30/2010 4 right hip osteoarthritis status post right total hip arthroplasty and using DePuy components  Admission 02/06/2006 for sternal wound infection  Admission 01/17/2006 for sternal wound infection status post right and insertion of fact drainage system  Admission 12/07/2005 with history of severe three-vessel coronary disease compensated by volume overload, bilateral pulmonary effusion. Underwent CABG x4  Discharge Exam: Filed Vitals:   04/17/12 1438 04/17/12 1454 04/17/12 1456 04/17/12 1458  BP: 125/74 143/72 118/79 127/80  Pulse: 65 66 65 70  Temp: 97.7 F (36.5 C)     TempSrc: Oral     Resp: 20     Height:      Weight:      SpO2: 97%       Her pleasant oriented Flat affect Chest clinically clear Largent is soft nontender nondistended Telemetry = normal sinus rhythm  Discharge Instructions     Medication List     As of 04/17/2012  3:38 PM    STOP taking these medications         INVOKANA 100 MG Tabs   Generic drug: Canagliflozin      lisinopril 20 MG tablet   Commonly known as: PRINIVIL,ZESTRIL      TAKE these medications          albuterol 108 (90 BASE) MCG/ACT inhaler   Commonly known as: PROVENTIL HFA;VENTOLIN HFA   Inhale 2 puffs into the lungs every 6 (six) hours as needed. For shortness of breath/wheezing      ALPRAZolam 0.5 MG tablet   Commonly known as: XANAX   Take 0.5 mg by mouth daily as needed. For anxiety      budesonide-formoterol 160-4.5 MCG/ACT inhaler   Commonly known as: SYMBICORT   Inhale 2 puffs into the lungs 2 (two) times daily.      carvedilol 12.5 MG tablet   Commonly known as: COREG   Take 0.5 tablets (6.25 mg total) by mouth every 12 (twelve) hours.      clopidogrel 75 MG tablet   Commonly known as: PLAVIX   Take 75 mg by mouth daily.      diphenhydrAMINE 25 mg capsule   Commonly known as: BENADRYL   Take 25 mg by mouth every 6 (six) hours as needed. For allergies      DULoxetine 60 MG capsule   Commonly known as: CYMBALTA   Take 60 mg by mouth daily.      furosemide 40 MG tablet   Commonly known as: LASIX   Take 1 tablet (40 mg total) by mouth daily.      nitroGLYCERIN 0.4 MG SL tablet   Commonly known as: NITROSTAT   Place 0.4 mg under the tongue every 5 (five) minutes as needed. For chest pain.      pantoprazole 40 MG tablet   Commonly known as: PROTONIX   Take 40 mg by mouth daily.      potassium chloride 10 MEQ tablet   Commonly known as: K-DUR   Take 10 mEq by mouth daily.      pregabalin 50 MG capsule   Commonly known as: LYRICA   Take 50 mg by mouth 2 (two) times daily.      rosuvastatin 40 MG tablet   Commonly known as: CRESTOR   Take 40 mg by mouth daily.      spironolactone 25 MG tablet   Commonly known as: ALDACTONE   Take 12.5 mg by mouth daily.           The results of significant diagnostics from this hospitalization (including imaging, microbiology, ancillary and laboratory) are listed below for reference.    Significant Diagnostic Studies: Ct Head Wo Contrast  04/16/2012  *RADIOLOGY REPORT*  Clinical Data: Weakness, imbalance, past  history of stroke, coronary disease post MI, hypertension, CHF, GERD, asthma, bronchitis  CT HEAD WITHOUT CONTRAST  Technique:  Contiguous axial images were obtained from the base of the skull through the vertex without contrast.  Comparison: 05/21/2011  Findings: Generalized atrophy. Normal ventricular morphology. No midline shift or mass effect. Small vessel chronic ischemic changes of deep cerebral white matter. No intracranial hemorrhage, mass lesion, or acute infarction. Visualized paranasal sinuses and mastoid air cells clear. Bones unremarkable. Atherosclerotic calcifications in the vertebral and internal carotid arteries at skull base. Bones and sinuses unremarkable.  IMPRESSION: Atrophy with small vessel chronic ischemic changes of  deep cerebral white matter. No acute intracranial abnormalities.   Original Report Authenticated By: Ulyses Southward, M.D.    Dg Chest Port 1 View  04/16/2012  *RADIOLOGY REPORT*  Clinical Data: Weakness  PORTABLE CHEST - 1 VIEW  Comparison: 03/25/2012  Findings: Prior CABG.  Heart size is enlarged.  Negative for heart failure.  Negative for infiltrate or effusion.  No mass lesion.  IMPRESSION: No acute cardiopulmonary disease.   Original Report Authenticated By: Janeece Riggers, M.D.     Microbiology: No results found for this or any previous visit (from the past 240 hour(s)).   Labs: Basic Metabolic Panel:  Lab 04/16/12 1610  NA 134*  K 4.4  CL 93*  CO2 30  GLUCOSE 170*  BUN 36*  CREATININE 1.42*  CALCIUM 9.5  MG --  PHOS --   Liver Function Tests: No results found for this basename: AST:5,ALT:5,ALKPHOS:5,BILITOT:5,PROT:5,ALBUMIN:5 in the last 168 hours No results found for this basename: LIPASE:5,AMYLASE:5 in the last 168 hours No results found for this basename: AMMONIA:5 in the last 168 hours CBC:  Lab 04/16/12 1416  WBC 9.6  NEUTROABS --  HGB 14.4  HCT 42.9  MCV 88.6  PLT 172   Cardiac Enzymes:  Lab 04/16/12 1416  CKTOTAL --  CKMB --   CKMBINDEX --  TROPONINI <0.30   BNP: BNP (last 3 results)  Basename 10/23/11 1123 10/20/11 1538 07/24/11 1044  PROBNP 1613.0* 6469.0* 213.0*   CBG:  Lab 04/17/12 1107 04/17/12 0607  GLUCAP 204* 144*       Signed:  Rhetta Mura  [facility] 04/17/2012, 3:27 PM

## 2012-08-05 ENCOUNTER — Ambulatory Visit: Payer: Medicare Other | Admitting: Cardiovascular Disease

## 2012-08-22 ENCOUNTER — Encounter: Payer: Self-pay | Admitting: Cardiovascular Disease

## 2012-09-25 ENCOUNTER — Inpatient Hospital Stay (HOSPITAL_COMMUNITY)
Admission: EM | Admit: 2012-09-25 | Discharge: 2012-09-29 | DRG: 292 | Disposition: A | Discharge status: Home / Self Care | Payer: Medicare Other | Attending: Cardiovascular Disease | Admitting: Cardiovascular Disease

## 2012-09-25 ENCOUNTER — Encounter (HOSPITAL_COMMUNITY): Payer: Self-pay | Admitting: Adult Health

## 2012-09-25 ENCOUNTER — Emergency Department (HOSPITAL_COMMUNITY): Payer: Medicare Other

## 2012-09-25 DIAGNOSIS — Z79899 Other long term (current) drug therapy: Secondary | ICD-10-CM

## 2012-09-25 DIAGNOSIS — E785 Hyperlipidemia, unspecified: Secondary | ICD-10-CM | POA: Diagnosis present

## 2012-09-25 DIAGNOSIS — J449 Chronic obstructive pulmonary disease, unspecified: Secondary | ICD-10-CM | POA: Diagnosis present

## 2012-09-25 DIAGNOSIS — I129 Hypertensive chronic kidney disease with stage 1 through stage 4 chronic kidney disease, or unspecified chronic kidney disease: Secondary | ICD-10-CM | POA: Diagnosis present

## 2012-09-25 DIAGNOSIS — Z96649 Presence of unspecified artificial hip joint: Secondary | ICD-10-CM

## 2012-09-25 DIAGNOSIS — N189 Chronic kidney disease, unspecified: Secondary | ICD-10-CM | POA: Diagnosis present

## 2012-09-25 DIAGNOSIS — M129 Arthropathy, unspecified: Secondary | ICD-10-CM | POA: Diagnosis present

## 2012-09-25 DIAGNOSIS — J4489 Other specified chronic obstructive pulmonary disease: Secondary | ICD-10-CM | POA: Diagnosis present

## 2012-09-25 DIAGNOSIS — E119 Type 2 diabetes mellitus without complications: Secondary | ICD-10-CM | POA: Diagnosis present

## 2012-09-25 DIAGNOSIS — K219 Gastro-esophageal reflux disease without esophagitis: Secondary | ICD-10-CM | POA: Diagnosis present

## 2012-09-25 DIAGNOSIS — I5023 Acute on chronic systolic (congestive) heart failure: Principal | ICD-10-CM | POA: Diagnosis present

## 2012-09-25 DIAGNOSIS — I428 Other cardiomyopathies: Secondary | ICD-10-CM | POA: Diagnosis present

## 2012-09-25 DIAGNOSIS — Z23 Encounter for immunization: Secondary | ICD-10-CM

## 2012-09-25 DIAGNOSIS — Z8673 Personal history of transient ischemic attack (TIA), and cerebral infarction without residual deficits: Secondary | ICD-10-CM

## 2012-09-25 DIAGNOSIS — I509 Heart failure, unspecified: Secondary | ICD-10-CM

## 2012-09-25 DIAGNOSIS — I2510 Atherosclerotic heart disease of native coronary artery without angina pectoris: Secondary | ICD-10-CM | POA: Diagnosis present

## 2012-09-25 DIAGNOSIS — J45909 Unspecified asthma, uncomplicated: Secondary | ICD-10-CM | POA: Diagnosis present

## 2012-09-25 DIAGNOSIS — F17200 Nicotine dependence, unspecified, uncomplicated: Secondary | ICD-10-CM | POA: Diagnosis present

## 2012-09-25 DIAGNOSIS — F411 Generalized anxiety disorder: Secondary | ICD-10-CM | POA: Diagnosis present

## 2012-09-25 DIAGNOSIS — Z794 Long term (current) use of insulin: Secondary | ICD-10-CM

## 2012-09-25 DIAGNOSIS — I252 Old myocardial infarction: Secondary | ICD-10-CM

## 2012-09-25 HISTORY — DX: Chronic kidney disease, unspecified: N18.9

## 2012-09-25 HISTORY — DX: Chronic systolic (congestive) heart failure: I50.22

## 2012-09-25 LAB — CBC
MCH: 28.6 pg (ref 26.0–34.0)
MCHC: 33.7 g/dL (ref 30.0–36.0)
RDW: 14.4 % (ref 11.5–15.5)

## 2012-09-25 LAB — GLUCOSE, CAPILLARY

## 2012-09-25 LAB — POCT I-STAT TROPONIN I: Troponin i, poc: 0.02 ng/mL (ref 0.00–0.08)

## 2012-09-25 LAB — BASIC METABOLIC PANEL
Calcium: 9.1 mg/dL (ref 8.4–10.5)
GFR calc Af Amer: 57 mL/min — ABNORMAL LOW (ref >90)
GFR calc non Af Amer: 50 mL/min — ABNORMAL LOW (ref >90)
Potassium: 4.2 mEq/L (ref 3.5–5.1)
Sodium: 132 mEq/L — ABNORMAL LOW (ref 135–145)

## 2012-09-25 MED ORDER — ASPIRIN 325 MG PO TABS
325.0000 mg | ORAL_TABLET | Freq: Once | ORAL | Status: AC
  Administered 2012-09-25: 325 mg via ORAL
  Filled 2012-09-25: qty 1

## 2012-09-25 MED ORDER — INSULIN GLARGINE 100 UNIT/ML ~~LOC~~ SOLN
28.0000 [IU] | Freq: Every day | SUBCUTANEOUS | Status: DC
  Administered 2012-09-25 – 2012-09-28 (×4): 28 [IU] via SUBCUTANEOUS
  Filled 2012-09-25 (×5): qty 0.28

## 2012-09-25 MED ORDER — LOSARTAN POTASSIUM 50 MG PO TABS
50.0000 mg | ORAL_TABLET | Freq: Every day | ORAL | Status: DC
  Administered 2012-09-25 – 2012-09-29 (×5): 50 mg via ORAL
  Filled 2012-09-25 (×5): qty 1

## 2012-09-25 MED ORDER — ONDANSETRON HCL 4 MG/2ML IJ SOLN: 4.0000 mg | Freq: Four times a day (QID) | INTRAMUSCULAR | Status: DC | PRN

## 2012-09-25 MED ORDER — ENOXAPARIN SODIUM 40 MG/0.4ML ~~LOC~~ SOLN
40.0000 mg | SUBCUTANEOUS | Status: DC
  Administered 2012-09-25 – 2012-09-28 (×4): 40 mg via SUBCUTANEOUS
  Filled 2012-09-25 (×5): qty 0.4

## 2012-09-25 MED ORDER — NITROGLYCERIN 0.4 MG SL SUBL: 0.4000 mg | SUBLINGUAL_TABLET | SUBLINGUAL | Status: DC | PRN

## 2012-09-25 MED ORDER — ZOLPIDEM TARTRATE 5 MG PO TABS: 5.0000 mg | ORAL_TABLET | Freq: Every evening | ORAL | Status: DC | PRN

## 2012-09-25 MED ORDER — NITROGLYCERIN 2 % TD OINT
1.0000 [in_us] | TOPICAL_OINTMENT | Freq: Once | TRANSDERMAL | Status: AC
  Administered 2012-09-25: 1 [in_us] via TOPICAL
  Filled 2012-09-25: qty 1

## 2012-09-25 MED ORDER — CARVEDILOL 6.25 MG PO TABS
6.2500 mg | ORAL_TABLET | Freq: Two times a day (BID) | ORAL | Status: DC
  Administered 2012-09-25 – 2012-09-29 (×8): 6.25 mg via ORAL
  Filled 2012-09-25 (×9): qty 1

## 2012-09-25 MED ORDER — CLOPIDOGREL BISULFATE 75 MG PO TABS
75.0000 mg | ORAL_TABLET | Freq: Every day | ORAL | Status: DC
  Administered 2012-09-25 – 2012-09-29 (×5): 75 mg via ORAL
  Filled 2012-09-25 (×5): qty 1

## 2012-09-25 MED ORDER — PANTOPRAZOLE SODIUM 40 MG PO TBEC
40.0000 mg | DELAYED_RELEASE_TABLET | Freq: Every day | ORAL | Status: DC
  Administered 2012-09-25 – 2012-09-29 (×5): 40 mg via ORAL
  Filled 2012-09-25 (×5): qty 1

## 2012-09-25 MED ORDER — ACETAMINOPHEN 325 MG PO TABS
650.0000 mg | ORAL_TABLET | ORAL | Status: DC | PRN
  Administered 2012-09-26: 650 mg via ORAL
  Filled 2012-09-25: qty 2

## 2012-09-25 MED ORDER — SODIUM CHLORIDE 0.9 % IJ SOLN: 3.0000 mL | INTRAMUSCULAR | Status: DC | PRN

## 2012-09-25 MED ORDER — ATORVASTATIN CALCIUM 40 MG PO TABS
40.0000 mg | ORAL_TABLET | Freq: Every day | ORAL | Status: DC
  Administered 2012-09-26 – 2012-09-28 (×3): 40 mg via ORAL
  Filled 2012-09-25 (×4): qty 1

## 2012-09-25 MED ORDER — FUROSEMIDE 10 MG/ML IJ SOLN
40.0000 mg | Freq: Three times a day (TID) | INTRAMUSCULAR | Status: DC
  Administered 2012-09-25 – 2012-09-27 (×5): 40 mg via INTRAVENOUS
  Filled 2012-09-25 (×8): qty 4

## 2012-09-25 MED ORDER — BUDESONIDE-FORMOTEROL FUMARATE 160-4.5 MCG/ACT IN AERO
2.0000 | INHALATION_SPRAY | Freq: Two times a day (BID) | RESPIRATORY_TRACT | Status: DC
  Administered 2012-09-26 – 2012-09-28 (×6): 2 via RESPIRATORY_TRACT
  Filled 2012-09-25: qty 6

## 2012-09-25 MED ORDER — INSULIN LISPRO 100 UNIT/ML ~~LOC~~ SOLN: 6.0000 [IU] | Freq: Three times a day (TID) | SUBCUTANEOUS | Status: DC

## 2012-09-25 MED ORDER — DIPHENHYDRAMINE HCL 25 MG PO CAPS: 25.0000 mg | ORAL_CAPSULE | Freq: Every evening | ORAL | Status: DC | PRN

## 2012-09-25 MED ORDER — ALBUTEROL SULFATE HFA 108 (90 BASE) MCG/ACT IN AERS
2.0000 | INHALATION_SPRAY | Freq: Four times a day (QID) | RESPIRATORY_TRACT | Status: DC | PRN
  Filled 2012-09-25: qty 6.7

## 2012-09-25 MED ORDER — SODIUM CHLORIDE 0.9 % IV SOLN: 250.0000 mL | INTRAVENOUS | Status: DC | PRN

## 2012-09-25 MED ORDER — ALPRAZOLAM 0.25 MG PO TABS: 0.2500 mg | ORAL_TABLET | Freq: Two times a day (BID) | ORAL | Status: DC | PRN

## 2012-09-25 MED ORDER — SPIRONOLACTONE 12.5 MG HALF TABLET
12.5000 mg | ORAL_TABLET | Freq: Every day | ORAL | Status: DC
  Administered 2012-09-25 – 2012-09-27 (×3): 12.5 mg via ORAL
  Filled 2012-09-25 (×3): qty 1

## 2012-09-25 MED ORDER — SODIUM CHLORIDE 0.9 % IJ SOLN
3.0000 mL | Freq: Two times a day (BID) | INTRAMUSCULAR | Status: DC
  Administered 2012-09-25 – 2012-09-29 (×8): 3 mL via INTRAVENOUS

## 2012-09-25 MED ORDER — PREGABALIN 50 MG PO CAPS
50.0000 mg | ORAL_CAPSULE | Freq: Two times a day (BID) | ORAL | Status: DC
  Administered 2012-09-25 – 2012-09-29 (×8): 50 mg via ORAL
  Filled 2012-09-25 (×8): qty 1

## 2012-09-25 MED ORDER — DULOXETINE HCL 60 MG PO CPEP
60.0000 mg | ORAL_CAPSULE | Freq: Every day | ORAL | Status: DC
  Administered 2012-09-25 – 2012-09-29 (×5): 60 mg via ORAL
  Filled 2012-09-25 (×5): qty 1

## 2012-09-25 NOTE — H&P (Signed)
Physician History and Physical  Patient ID: [name] MRN: [id number] DOB/AGE: 1946-02-27 67 y.o. Admit date: 09/25/2012  Primary Care Physician: Ancil Boozer, MD Primary Cardiologist:  Kristeen Miss  Active Problems:   * No active hospital problems. *   HPI:   67 y/o female with PMH of HTN, IDDM, CAD s/p CABG 2007 (LIMA-->RI, SVG-->dRCA, SVG-->LAD, SVG--OM), severe chronic systolic heart failure (LVEF 25% by TTE from 05/19/2011) presenting with  Systolic congestive heart failure.  She lives independently and has been progressively more dyspnic over the last 3 weeks.  Her daughter wanted her to go to the hospital but she wouldn't.  4 pillow orhtopnea last two nights and finally came in.  She does not have a scale but thinks her weight is up 8-10 lbs.  More erythema and swelling in legs.  No chest pain. Compliant with meds including lasix but has not used sliding scale or extra.  Mild nausea today but no abdominal pain Still smokes and would appear to also have some chronic bronchitis   Review of systems complete and found to be negative unless listed above   Past Medical History  Diagnosis Date  . Stroke   . Coronary artery disease   . Ear infection     right  . Diabetes mellitus   . Hypertension   . 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 Asthma   . Bronchitis   . Reflux   . Hyperlipidemia   . Pneumonia   . CHF (congestive heart failure)   . GERD (gastroesophageal reflux disease)   . Arthritis     Family History  Problem Relation Age of Onset  . Coronary artery disease Father 38    Unclear if it was an MI  . Stroke Mother 55  . Coronary artery disease Brother 89  . Coronary artery disease Brother 68    History   Social History  . Marital Status: Divorced    Spouse Name: N/A    Number of Children: 4  . Years of Education: N/A   Occupational History  . Retired  [employer]    [employer]   Social History Main Topics  . Smoking status: Current Every Day  Smoker -- 0.80 packs/day for 50 years    Types: Cigarettes  . Smokeless tobacco: Never Used  . Alcohol Use: No  . Drug Use: No  . Sexually Active: Not on file   Other Topics Concern  . Not on file   Social History Narrative   Lives with grandson 69 years old.    Past Surgical History  Procedure Laterality Date  . Coronary artery bypass graft      LIMA to RI, SVG to distal RCA, SVG to LAD, SVG to OM. 2007  . Joint replacement      Right hip replacement   . Debridement skin / sq / muscle of trunk      s/p staph infection from CABG 2007  . Cystectomy      Subcutaneous  . Partial hip arthroplasty    . Stroke        (Not in a hospital admission)  Physical Exam: Blood pressure 144/97, pulse 89, temperature 97.8 F (36.6 C), temperature source Oral, resp. rate 30, SpO2 92.00%.   Affect appropriate Chronically ill bronchitic female with poor dentition HEENT: normal Neck supple with no adenopathy JVP elevated no bruits no thyromegaly Lungs rales at bases no wheezing and good diaphragmatic motion Heart:  S1/S2 no murmur, no  rub, gallop or click PMI normal Abdomen: benighn, BS positve, no tenderness, no AAA no bruit.  No HSM or HJR Distal pulses intact with no bruits Plus 2-3 edema bilaterally with erythema Neuro non-focal Skin warm and dry No muscular weakness    Medication List    ASK your doctor about these medications       albuterol 108 (90 BASE) MCG/ACT inhaler  Commonly known as:  PROVENTIL HFA;VENTOLIN HFA  Inhale 2 puffs into the lungs every 6 (six) hours as needed. For shortness of breath/wheezing     ALPRAZolam 0.5 MG tablet  Commonly known as:  XANAX  Take 0.5 mg by mouth daily as needed. For anxiety     budesonide-formoterol 160-4.5 MCG/ACT inhaler  Commonly known as:  SYMBICORT  Inhale 2 puffs into the lungs 2 (two) times daily.     carvedilol 12.5 MG tablet  Commonly known as:  COREG  Take 0.5 tablets (6.25 mg total) by mouth every 12 (twelve)  hours.     clopidogrel 75 MG tablet  Commonly known as:  PLAVIX  Take 75 mg by mouth daily.     diphenhydrAMINE 25 mg capsule  Commonly known as:  BENADRYL  Take 25 mg by mouth every 6 (six) hours as needed. For allergies     DULoxetine 60 MG capsule  Commonly known as:  CYMBALTA  Take 60 mg by mouth daily.     furosemide 40 MG tablet  Commonly known as:  LASIX  Take 40 mg by mouth 2 (two) times daily.     insulin glargine 100 UNIT/ML injection  Commonly known as:  LANTUS  Inject 28 Units into the skin at bedtime.     insulin lispro 100 UNIT/ML injection  Commonly known as:  HUMALOG  Inject 6-11 Units into the skin 3 (three) times daily before meals. Per sliding scale     nitroGLYCERIN 0.4 MG SL tablet  Commonly known as:  NITROSTAT  Place 0.4 mg under the tongue every 5 (five) minutes as needed. For chest pain.     pantoprazole 40 MG tablet  Commonly known as:  PROTONIX  Take 40 mg by mouth daily.     pregabalin 50 MG capsule  Commonly known as:  LYRICA  Take 50 mg by mouth 2 (two) times daily.     rosuvastatin 40 MG tablet  Commonly known as:  CRESTOR  Take 40 mg by mouth daily.     spironolactone 25 MG tablet  Commonly known as:  ALDACTONE  Take 12.5 mg by mouth daily.        Labs:   Lab Results  Component Value Date   WBC 9.2 09/25/2012   HGB 14.2 09/25/2012   HCT 42.1 09/25/2012   MCV 84.7 09/25/2012   PLT 163 09/25/2012    Recent Labs Lab 09/25/12 1743  NA 132*  K 4.2  CL 93*  CO2 26  BUN 20  CREATININE 1.13*  CALCIUM 9.1  GLUCOSE 311*   Lab Results  Component Value Date   CKTOTAL 109 10/22/2011   CKMB 4.6* 10/22/2011   TROPONINI <0.30 04/16/2012    Lab Results  Component Value Date   CHOL 248* 05/19/2011   Lab Results  Component Value Date   HDL 34* 05/19/2011   Lab Results  Component Value Date   LDLCALC 183* 05/19/2011   Lab Results  Component Value Date   TRIG 153* 05/19/2011   Lab Results  Component Value Date   CHOLHDL  7.3  05/19/2011   No results found for this basename: LDLDIRECT      Radiology: Dg Chest 2 View  09/25/2012   *RADIOLOGY REPORT*  Clinical Data: Chest pain, chronic bronchitis  CHEST - 2 VIEW  Comparison: 04/16/2012  Findings: Bronchitic changes.  No focal consolidation. No pleural effusion or pneumothorax.  Cardiomegaly.  Postsurgical changes related to prior CABG.  Degenerative changes of the visualized thoracolumbar spine.  IMPRESSION: No evidence of acute cardiopulmonary disease.   Original Report Authenticated By: Charline Bills, M.D.    EKG:  SR rate 93 biatrial enlargement with ICRBBB no acute ischemic changes  ASSESSMENT AND PLAN: Systolic CHF:  Fairly significant volume overload. Will start Rx with lasix 40 iv tid and continue home dose of aldactone.  Hopefully she will Respond and not need inotropes. Would benefit from referral to CHF clinic.   CAD:  No chest pain ECG changes and enzymes negative continue asa and beta blocker DM:  SS insulin Chol;  Continue statin  Signed: Theron Arista Nishan5/22/2014, 7:30 PM

## 2012-09-25 NOTE — ED Notes (Signed)
Patient transported to X-ray 

## 2012-09-25 NOTE — ED Notes (Addendum)
Pt c/o intermittent mid-sternum chest pain radiating to Left chest, increase SOB while lying and that worsens w/activity, dry cough, chills, all over body aches, upper abd discomfort, nausea, BLE pitting edema +2, and generalized weakness x2 weeks. Pt states her "stomach feels full." Pt has a hx of CHF and states "I know this is a CHF exacerbation."

## 2012-09-25 NOTE — ED Provider Notes (Signed)
Patient seen/examined in the Emergency Department in conjunction with Resident Physician Provider [facility] Patient reports shortness of breath and chest pain Exam : awake/alert, crackles noted bilaterally Plan: admit for CHF Pt agreeable with plan   Joya Gaskins, MD 09/25/12 1950

## 2012-09-25 NOTE — ED Notes (Addendum)
Presents with left sided and sternal chest pain that began 2 weeks ago. Pt staes, "I have CHF and I know what it is, I am having CHF again. MY breathing has become harder and harder and I was afraid I would not be able to breath bythis evening"  CP associated with SOB, nausea and bilateral +2 pitting edema. ssats 94 % RA, non productive cough, pt diminished bilaterally.  PT reports bilateral upper quadrant pain described as "a real bad stomach ache, I don't know if it is hurting because of all the fluid on my chest"

## 2012-09-25 NOTE — ED Notes (Signed)
Dr. Wickline at bedside.  

## 2012-09-25 NOTE — ED Provider Notes (Signed)
History     CSN: [id number]  Arrival date & time 09/25/12  1731   First MD Initiated Contact with Patient 09/25/12 1808      Chief Complaint  Patient presents with  . Chest Pain    (Consider location/radiation/quality/duration/timing/severity/associated sxs/prior treatment) HPI Comments: 67 y.o. female w/ pmh of chf (EF 31%), CAD, CABG, presents to the Er w/ the cc of shortness of breath and swelling in legs over the past 3 weeks. She states she did not want to come to the hospital, but came today at the insisting of family.  Usually able to walk to her brothers house, to the mailbox, and over the past 3 weeks can't. She states chest pressure, but not pain -- states this is baseline for her, and near her scar tissue from chest wall. She states she can't sleep at night due to inability to lay flat. Pt feels she has gained 8 to 10 pounds.  Patient is a 67 y.o. female presenting with general illness. The history is provided by the patient.  Illness Location:  Legs Severity:  Mild Onset quality:  Gradual Timing:  Constant Progression:  Worsening Chronicity:  New Associated symptoms: shortness of breath   Associated symptoms: no abdominal pain, no chest pain, no cough, no diarrhea, no fatigue, no fever, no headaches, no rash, no vomiting and no wheezing     Past Medical History  Diagnosis Date  . Stroke   . Coronary artery disease   . Ear infection     right  . Diabetes mellitus   . Hypertension   . 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 Asthma   . Bronchitis   . Reflux   . Hyperlipidemia   . Pneumonia   . CHF (congestive heart failure)   . GERD (gastroesophageal reflux disease)   . Arthritis     Past Surgical History  Procedure Laterality Date  . Coronary artery bypass graft      LIMA to RI, SVG to distal RCA, SVG to LAD, SVG to OM. 2007  . Joint replacement      Right hip replacement   . Debridement skin / sq / muscle of trunk      s/p staph infection from  CABG 2007  . Cystectomy      Subcutaneous  . Partial hip arthroplasty    . Stroke      Family History  Problem Relation Age of Onset  . Coronary artery disease Father 80    Unclear if it was an MI  . Stroke Mother 74  . Coronary artery disease Brother 29  . Coronary artery disease Brother 47    History  Substance Use Topics  . Smoking status: Current Every Day Smoker -- 0.80 packs/day for 50 years    Types: Cigarettes  . Smokeless tobacco: Never Used  . Alcohol Use: No    OB History   Grav Para Term Preterm Abortions TAB SAB Ect Mult Living                  Review of Systems  Constitutional: Negative for fever, chills and fatigue.  HENT: Negative for facial swelling, drooling, neck pain and dental problem.   Eyes: Negative for pain, discharge and itching.  Respiratory: Positive for chest tightness and shortness of breath. Negative for cough, choking, wheezing and stridor.   Cardiovascular: Positive for leg swelling. Negative for chest pain.  Gastrointestinal: Negative for vomiting, abdominal pain and diarrhea.  Endocrine: Negative  for cold intolerance and heat intolerance.  Genitourinary: Negative for vaginal discharge, difficulty urinating and vaginal pain.  Skin: Negative for pallor and rash.  Neurological: Positive for weakness. Negative for dizziness, light-headedness and headaches.  Psychiatric/Behavioral: Negative for behavioral problems and agitation.    Allergies  Codeine; Ivp dye; Penicillins; and Sulfa antibiotics  Home Medications   Current Outpatient Rx  Name  Route  Sig  Dispense  Refill  . albuterol (PROVENTIL HFA;VENTOLIN HFA) 108 (90 BASE) MCG/ACT inhaler   Inhalation   Inhale 2 puffs into the lungs every 6 (six) hours as needed. For shortness of breath/wheezing         . ALPRAZolam (XANAX) 0.5 MG tablet   Oral   Take 0.5 mg by mouth daily as needed. For anxiety         . budesonide-formoterol (SYMBICORT) 160-4.5 MCG/ACT inhaler    Inhalation   Inhale 2 puffs into the lungs 2 (two) times daily.          . carvedilol (COREG) 12.5 MG tablet   Oral   Take 0.5 tablets (6.25 mg total) by mouth every 12 (twelve) hours.   60 tablet      . clopidogrel (PLAVIX) 75 MG tablet   Oral   Take 75 mg by mouth daily.         . diphenhydrAMINE (BENADRYL) 25 mg capsule   Oral   Take 25 mg by mouth every 6 (six) hours as needed. For allergies         . DULoxetine (CYMBALTA) 60 MG capsule   Oral   Take 60 mg by mouth daily.         . furosemide (LASIX) 40 MG tablet   Oral   Take 1 tablet (40 mg total) by mouth daily.   60 tablet   3   . nitroGLYCERIN (NITROSTAT) 0.4 MG SL tablet   Sublingual   Place 0.4 mg under the tongue every 5 (five) minutes as needed. For chest pain.         . pantoprazole (PROTONIX) 40 MG tablet   Oral   Take 40 mg by mouth daily.         . potassium chloride (K-DUR) 10 MEQ tablet   Oral   Take 10 mEq by mouth daily.         . pregabalin (LYRICA) 50 MG capsule   Oral   Take 50 mg by mouth 2 (two) times daily.         . rosuvastatin (CRESTOR) 40 MG tablet   Oral   Take 40 mg by mouth daily.         Marland Kitchen spironolactone (ALDACTONE) 25 MG tablet   Oral   Take 12.5 mg by mouth daily.           BP 150/87  Pulse 87  Temp(Src) 97.8 F (36.6 C) (Oral)  Resp 22  SpO2 94%  Physical Exam  Constitutional: She is oriented to person, place, and time. She appears well-developed. No distress.  HENT:  Head: Normocephalic and atraumatic.  Eyes: Pupils are equal, round, and reactive to light. Right eye exhibits no discharge. Left eye exhibits no discharge.  Neck: Neck supple. No tracheal deviation present.  Cardiovascular: Normal rate.  Exam reveals no gallop and no friction rub.   Pulmonary/Chest: No stridor. No respiratory distress.  Bilateral rales in lower lung fields   Abdominal: Soft. She exhibits no distension. There is no tenderness. There is no rebound.   Musculoskeletal:  She exhibits no edema. Tenderness: pt with pitting edema up to knees, +2, bilateral   Neurological: She is alert and oriented to person, place, and time.  Skin: Skin is warm. She is not diaphoretic.    ED Course  Procedures (including critical care time)  Labs Reviewed  BASIC METABOLIC PANEL - Abnormal; Notable for the following:    Sodium 132 (*)    Chloride 93 (*)    Glucose, Bld 311 (*)    Creatinine, Ser 1.13 (*)    GFR calc non Af Amer 50 (*)    GFR calc Af Amer 57 (*)    All other components within normal limits  PRO B NATRIURETIC PEPTIDE - Abnormal; Notable for the following:    Pro B Natriuretic peptide (BNP) 16961.0 (*)    All other components within normal limits  GLUCOSE, CAPILLARY - Abnormal; Notable for the following:    Glucose-Capillary 274 (*)    All other components within normal limits  CBC  BASIC METABOLIC PANEL  POCT I-STAT TROPONIN I   Dg Chest 2 View  09/25/2012   *RADIOLOGY REPORT*  Clinical Data: Chest pain, chronic bronchitis  CHEST - 2 VIEW  Comparison: 04/16/2012  Findings: Bronchitic changes.  No focal consolidation. No pleural effusion or pneumothorax.  Cardiomegaly.  Postsurgical changes related to prior CABG.  Degenerative changes of the visualized thoracolumbar spine.  IMPRESSION: No evidence of acute cardiopulmonary disease.   Original Report Authenticated By: Charline Bills, M.D.       MDM  Pt with physical exam findings and HPI consistent w/ chf exacerbation. Will get labs, give lasix, admit.   Pt's labs consistent w/ chf exacerbation. Have given pt lasix. Consulted cards team, who is admitting pt for further eval and care.    1. CHF (congestive heart failure)            Bernadene Person, MD 09/26/12 [phone]

## 2012-09-26 ENCOUNTER — Encounter (HOSPITAL_COMMUNITY): Payer: Self-pay

## 2012-09-26 LAB — GLUCOSE, CAPILLARY
Glucose-Capillary: 183 mg/dL — ABNORMAL HIGH (ref 70–99)
Glucose-Capillary: 166 mg/dL — ABNORMAL HIGH (ref 70–99)

## 2012-09-26 LAB — BASIC METABOLIC PANEL
BUN: 21 mg/dL (ref 6–23)
CO2: 34 mEq/L — ABNORMAL HIGH (ref 19–32)
Calcium: 9.2 mg/dL (ref 8.4–10.5)
Creatinine, Ser: 1.2 mg/dL — ABNORMAL HIGH (ref 0.50–1.10)
GFR calc non Af Amer: 46 mL/min — ABNORMAL LOW (ref >90)
Glucose, Bld: 178 mg/dL — ABNORMAL HIGH (ref 70–99)

## 2012-09-26 MED ORDER — INSULIN ASPART 100 UNIT/ML ~~LOC~~ SOLN
0.0000 [IU] | Freq: Three times a day (TID) | SUBCUTANEOUS | Status: DC
  Administered 2012-09-26 – 2012-09-28 (×7): 3 [IU] via SUBCUTANEOUS
  Administered 2012-09-29: 2 [IU] via SUBCUTANEOUS

## 2012-09-26 MED ORDER — PNEUMOCOCCAL VAC POLYVALENT 25 MCG/0.5ML IJ INJ
0.5000 mL | INJECTION | INTRAMUSCULAR | Status: AC
  Administered 2012-09-27: 0.5 mL via INTRAMUSCULAR
  Filled 2012-09-26: qty 0.5

## 2012-09-26 NOTE — Progress Notes (Signed)
Patient ID: [name], female   DOB: 1946-04-27, 67 y.o.   MRN: [id number]    Subjective:  Denies SSCP, palpitations Orthopnea improved and could sleep last night Objective:  Filed Vitals:   09/25/12 2137 09/26/12 0019 09/26/12 0400 09/26/12 0752  BP:  147/89 121/71   Pulse:  87 77   Temp:  98 F (36.7 C) 97.5 F (36.4 C) 97.4 F (36.3 C)  TempSrc:  Oral Oral Oral  Resp:  22 29   Height: 5' (1.524 m)     Weight: 164 lb 14.5 oz (74.8 kg)  167 lb 8.8 oz (76 kg)   SpO2:  96% 94% 93%    Intake/Output from previous day:  Intake/Output Summary (Last 24 hours) at 09/26/12 1308 Last data filed at 09/26/12 0100  Gross per 24 hour  Intake      0 ml  Output    775 ml  Net   -775 ml    Physical Exam: Affect appropriate Bronchitic female HEENT: normal Neck supple with no adenopathy JVP elevated no bruits no thyromegaly Lungsbasilar crackles  with no wheezing and good diaphragmatic motion Heart:  S1/S2 no murmur, no rub, gallop or click PMI normal Abdomen: benighn, BS positve, no tenderness, no AAA no bruit.  No HSM or HJR Distal pulses intact with no bruits Plus 2 edema with erythema Neuro non-focal Skin warm and dry No muscular weakness   Lab Results: Basic Metabolic Panel:  Recent Labs  65/78/46 1743 09/26/12 0523  NA 132* 135  K 4.2 3.8  CL 93* 95*  CO2 26 34*  GLUCOSE 311* 178*  BUN 20 21  CREATININE 1.13* 1.20*  CALCIUM 9.1 9.2   CBC:  Recent Labs  09/25/12 1743  WBC 9.2  HGB 14.2  HCT 42.1  MCV 84.7  PLT 163    Imaging: Dg Chest 2 View  09/25/2012   *RADIOLOGY REPORT*  Clinical Data: Chest pain, chronic bronchitis  CHEST - 2 VIEW  Comparison: 04/16/2012  Findings: Bronchitic changes.  No focal consolidation. No pleural effusion or pneumothorax.  Cardiomegaly.  Postsurgical changes related to prior CABG.  Degenerative changes of the visualized thoracolumbar spine.  IMPRESSION: No evidence of acute cardiopulmonary disease.   Original Report  Authenticated By: Charline Bills, M.D.    Cardiac Studies:  ECG:    Telemetry:  NSR no VT 09/26/2012   Echo:  6/13  EF 15-20%  Medications:   . atorvastatin  40 mg Oral q1800  . budesonide-formoterol  2 puff Inhalation BID  . carvedilol  6.25 mg Oral Q12H  . clopidogrel  75 mg Oral Daily  . DULoxetine  60 mg Oral Daily  . enoxaparin (LOVENOX) injection  40 mg Subcutaneous Q24H  . furosemide  40 mg Intravenous Q8H  . insulin aspart  0-15 Units Subcutaneous TID WC  . insulin glargine  28 Units Subcutaneous QHS  . losartan  50 mg Oral Daily  . pantoprazole  40 mg Oral Daily  . [START ON 09/27/2012] pneumococcal 23 valent vaccine  0.5 mL Intramuscular Tomorrow-1000  . pregabalin  50 mg Oral BID  . sodium chloride  3 mL Intravenous Q12H  . spironolactone  12.5 mg Oral Daily       Assessment/Plan:  CHF:  Continue q8 iv lasix and home dose of aldactone. Will need at least 48 hours more of iv Rx  Some erythema In LE;s but dont think Rx for cellulitis needed.  DM:  Continue SS insuling HTN:  Continue ARB  Charlton Haws 09/26/2012, 8:28 AM

## 2012-09-26 NOTE — Progress Notes (Signed)
Utilization review completed.  

## 2012-09-26 NOTE — Progress Notes (Signed)
Has c/o soreness rt arm, gauze dressing off and area is indurated, no redness.  States, nurse tried to start IV but without success.  Heat pack placed for 15 minutes.  Continue to monitor.

## 2012-09-26 NOTE — Care Management Note (Signed)
    Page 1 of 1   09/26/2012     3:42:00 PM   CARE MANAGEMENT NOTE 09/26/2012  Patient:  [name], [name]   Account Number:  [id number]  Date Initiated:  09/26/2012  Documentation initiated by:  Donn Pierini  Subjective/Objective Assessment:   Pt admitted with CHF     Action/Plan:   PTA pt lived at home alone-   Anticipated DC Date:  09/29/2012   Anticipated DC Plan:  HOME W HOME HEALTH SERVICES      DC Planning Services  CM consult      [facility] Choice  HOME HEALTH   Choice offered to / List presented to:             Status of service:  In process, will continue to follow Medicare Important Message given?   (If response is "NO", the following Medicare IM given date fields will be blank) Date Medicare IM given:   Date Additional Medicare IM given:    Discharge Disposition:    Per UR Regulation:  Reviewed for med. necessity/level of care/duration of stay  If discussed at Long Length of Stay Meetings, dates discussed:    Comments:  09/26/12- 1515- Donn Pierini RN, BSN [phone] Received referral for [facility] HF screen- attempted to speak with pt- but pt was sleeping and when awoken pt asked this CM if I would come back another time to speak with her- will f/u prior to d/c regarding d/c needs- pt may benefit from [facility] for CHF f/u.

## 2012-09-27 LAB — GLUCOSE, CAPILLARY
Glucose-Capillary: 161 mg/dL — ABNORMAL HIGH (ref 70–99)
Glucose-Capillary: 92 mg/dL (ref 70–99)

## 2012-09-27 MED ORDER — SPIRONOLACTONE 25 MG PO TABS
25.0000 mg | ORAL_TABLET | Freq: Every day | ORAL | Status: DC
  Administered 2012-09-28 – 2012-09-29 (×2): 25 mg via ORAL
  Filled 2012-09-27 (×2): qty 1

## 2012-09-27 MED ORDER — TORSEMIDE 20 MG PO TABS
40.0000 mg | ORAL_TABLET | Freq: Two times a day (BID) | ORAL | Status: DC
  Administered 2012-09-27 – 2012-09-29 (×5): 40 mg via ORAL
  Filled 2012-09-27 (×6): qty 2

## 2012-09-27 NOTE — Progress Notes (Signed)
Patient ID: JAMILLIA CLOSSON, female   DOB: 08-07-1945, 67 y.o.   MRN: [id number] Patient ID: MIKE BERNTSEN, female   DOB: 1945-06-21, 67 y.o.   MRN: [id number]    Subjective:  Denies SSCP, palpitations Orthopnea improved as is edema Some pain in RUE from failed iv attempt Objective:  Filed Vitals:   09/27/12 0500 09/27/12 0700 09/27/12 0722 09/27/12 0828  BP:   115/89   Pulse:   70   Temp:  97.7 F (36.5 C) 97.7 F (36.5 C)   TempSrc:  Oral Oral   Resp:   19   Height:      Weight: 165 lb 2 oz (74.9 kg)     SpO2:   95% 91%    Intake/Output from previous day:  Intake/Output Summary (Last 24 hours) at 09/27/12 1023 Last data filed at 09/27/12 0800  Gross per 24 hour  Intake    480 ml  Output   1100 ml  Net   -620 ml    Physical Exam: Affect appropriate Bronchitic female HEENT: normal Neck supple with no adenopathy JVP elevated no bruits no thyromegaly Lungsbasilar crackles  with no wheezing and good diaphragmatic motion Heart:  S1/S2 no murmur, no rub, gallop or click PMI normal Abdomen: benighn, BS positve, no tenderness, no AAA no bruit.  No HSM or HJR Distal pulses intact with no bruits Plus 2 edema with erythema that is improved  Neuro non-focal Skin warm and dry No muscular weakness   Lab Results: Basic Metabolic Panel:  Recent Labs  78/29/56 1743 09/26/12 0523  NA 132* 135  K 4.2 3.8  CL 93* 95*  CO2 26 34*  GLUCOSE 311* 178*  BUN 20 21  CREATININE 1.13* 1.20*  CALCIUM 9.1 9.2   CBC:  Recent Labs  09/25/12 1743  WBC 9.2  HGB 14.2  HCT 42.1  MCV 84.7  PLT 163    Imaging: Dg Chest 2 View  09/25/2012   *RADIOLOGY REPORT*  Clinical Data: Chest pain, chronic bronchitis  CHEST - 2 VIEW  Comparison: 04/16/2012  Findings: Bronchitic changes.  No focal consolidation. No pleural effusion or pneumothorax.  Cardiomegaly.  Postsurgical changes related to prior CABG.  Degenerative changes of the visualized thoracolumbar spine.  IMPRESSION: No  evidence of acute cardiopulmonary disease.   Original Report Authenticated By: Charline Bills, M.D.    Cardiac Studies:  ECG:    Telemetry:  NSR no VT 09/27/2012   Echo:  6/13  EF 15-20%  Medications:   . atorvastatin  40 mg Oral q1800  . budesonide-formoterol  2 puff Inhalation BID  . carvedilol  6.25 mg Oral Q12H  . clopidogrel  75 mg Oral Daily  . DULoxetine  60 mg Oral Daily  . enoxaparin (LOVENOX) injection  40 mg Subcutaneous Q24H  . furosemide  40 mg Intravenous Q8H  . insulin aspart  0-15 Units Subcutaneous TID WC  . insulin glargine  28 Units Subcutaneous QHS  . losartan  50 mg Oral Daily  . pantoprazole  40 mg Oral Daily  . pregabalin  50 mg Oral BID  . sodium chloride  3 mL Intravenous Q12H  . spironolactone  12.5 mg Oral Daily       Assessment/Plan:  CHF:  Change to demedex 40 PO bid increase aldactone to 25 mg  Follow lytes  Likely discharge Monday DM:  Continue SS insuling HTN:  Continue ARB  Charlton Haws 09/27/2012, 10:23 AM

## 2012-09-27 NOTE — ED Provider Notes (Signed)
I have personally seen and examined the patient.  I have discussed the plan of care with the resident.  I have reviewed the documentation on PMH/FH/Soc. History.  I have reviewed the documentation of the resident and agree.    Date: 09/25/2012  Rate: 93  Rhythm: normal sinus rhythm  QRS Axis: normal  Intervals: normal  ST/T Wave abnormalities: nonspecific ST changes  Conduction Disutrbances:none  Narrative Interpretation: biatrial enlargement noted  Old EKG Reviewed: unchanged    Joya Gaskins, MD 09/27/12 0330

## 2012-09-28 ENCOUNTER — Inpatient Hospital Stay (HOSPITAL_COMMUNITY): Payer: Medicare Other

## 2012-09-28 LAB — BASIC METABOLIC PANEL
BUN: 28 mg/dL — ABNORMAL HIGH (ref 6–23)
Calcium: 9 mg/dL (ref 8.4–10.5)
Chloride: 92 mEq/L — ABNORMAL LOW (ref 96–112)
Creatinine, Ser: 1.42 mg/dL — ABNORMAL HIGH (ref 0.50–1.10)
GFR calc Af Amer: 44 mL/min — ABNORMAL LOW (ref >90)

## 2012-09-28 LAB — GLUCOSE, CAPILLARY
Glucose-Capillary: 166 mg/dL — ABNORMAL HIGH (ref 70–99)
Glucose-Capillary: 154 mg/dL — ABNORMAL HIGH (ref 70–99)

## 2012-09-28 NOTE — Progress Notes (Signed)
Patient ID: [name], female   DOB: 09-02-1945, 67 y.o.   MRN: [id number]    Subjective:  Denies SSCP, palpitations Orthopnea improved as is edema Nurse indicates sats less than 90% without oxygen  Objective:  Filed Vitals:   09/27/12 2354 09/28/12 0356 09/28/12 0700 09/28/12 0732  BP: 117/95 133/88  129/61  Pulse: 78 76  67  Temp: 97.8 F (36.6 C) 97.7 F (36.5 C) 97.7 F (36.5 C) 97.7 F (36.5 C)  TempSrc: Oral Oral Oral Oral  Resp: 19 20  21   Height:      Weight:  165 lb 2 oz (74.9 kg)    SpO2: 97% 97%  97%    Intake/Output from previous day:  Intake/Output Summary (Last 24 hours) at 09/28/12 0816 Last data filed at 09/28/12 0800  Gross per 24 hour  Intake    960 ml  Output   2350 ml  Net  -1390 ml    Physical Exam: Affect appropriate Bronchitic female HEENT: normal Neck supple with no adenopathy JVP elevated no bruits no thyromegaly Lungsbasilar crackles  with no wheezing and good diaphragmatic motion Heart:  S1/S2 no murmur, no rub, gallop or click PMI normal Abdomen: benighn, BS positve, no tenderness, no AAA no bruit.  No HSM or HJR Distal pulses intact with no bruits Plus 2 edema with erythema that is improved  Neuro non-focal Skin warm and dry No muscular weakness   Lab Results: Basic Metabolic Panel:  Recent Labs  40/98/11 0523 09/28/12 0355  NA 135 135  K 3.8 3.8  CL 95* 92*  CO2 34* 35*  GLUCOSE 178* 109*  BUN 21 28*  CREATININE 1.20* 1.42*  CALCIUM 9.2 9.0   CBC:  Recent Labs  09/25/12 1743  WBC 9.2  HGB 14.2  HCT 42.1  MCV 84.7  PLT 163    Imaging: No results found.  Cardiac Studies:  ECG:    Telemetry:  NSR no VT 09/28/2012   Echo:  6/13  EF 15-20%  Medications:   . atorvastatin  40 mg Oral q1800  . budesonide-formoterol  2 puff Inhalation BID  . carvedilol  6.25 mg Oral Q12H  . clopidogrel  75 mg Oral Daily  . DULoxetine  60 mg Oral Daily  . enoxaparin (LOVENOX) injection  40 mg Subcutaneous Q24H    . insulin aspart  0-15 Units Subcutaneous TID WC  . insulin glargine  28 Units Subcutaneous QHS  . losartan  50 mg Oral Daily  . pantoprazole  40 mg Oral Daily  . pregabalin  50 mg Oral BID  . sodium chloride  3 mL Intravenous Q12H  . spironolactone  25 mg Oral Daily  . torsemide  40 mg Oral BID       Assessment/Plan:  CHF:  Change to demedex 40 PO bid increase aldactone to 25 mg  Follow lytes  Likely discharge Monday DM:  Continue SS insuling HTN:  Continue ARB  Will have her use IS  Check CXR today and lytes/BNP in am  [facility] referral done and signed.  Will need Outpatient f/u with CHF clinic and Dr Teressa Lower as well as Dr Laurice Record [facility] 09/28/2012, 8:16 AM

## 2012-09-29 ENCOUNTER — Encounter (HOSPITAL_COMMUNITY): Payer: Self-pay | Admitting: Physician Assistant

## 2012-09-29 LAB — BASIC METABOLIC PANEL
Chloride: 91 mEq/L — ABNORMAL LOW (ref 96–112)
GFR calc Af Amer: 44 mL/min — ABNORMAL LOW (ref >90)
GFR calc non Af Amer: 38 mL/min — ABNORMAL LOW (ref >90)
Potassium: 4.5 mEq/L (ref 3.5–5.1)
Sodium: 136 mEq/L (ref 135–145)

## 2012-09-29 LAB — GLUCOSE, CAPILLARY: Glucose-Capillary: 136 mg/dL — ABNORMAL HIGH (ref 70–99)

## 2012-09-29 MED ORDER — SPIRONOLACTONE 25 MG PO TABS: 25.0000 mg | ORAL_TABLET | Freq: Every day | ORAL | Status: DC

## 2012-09-29 MED ORDER — LOSARTAN POTASSIUM 50 MG PO TABS: 50.0000 mg | ORAL_TABLET | Freq: Every day | ORAL | Status: DC

## 2012-09-29 MED ORDER — TORSEMIDE 20 MG PO TABS: 40.0000 mg | ORAL_TABLET | Freq: Two times a day (BID) | ORAL | Status: DC

## 2012-09-29 NOTE — Progress Notes (Signed)
Discharge instructions, f/u appt., prescriptions adn exit care note on congested heart failure discussed and given to patient. Patient going home with grandson, oxygen and belongings.  [name]

## 2012-09-29 NOTE — Progress Notes (Signed)
Patient ID: [name], female   DOB: 1945-07-29, 67 y.o.   MRN: [id number]    Subjective:  Denies SSCP, palpitations Orthopnea improved as is edema Nurse indicates sats less than 90% without oxygen Patient wants to go home.  Promises to be compliant and f/u with Dr Elease Hashimoto and CHF clinci  Objective:  Filed Vitals:   09/28/12 2355 09/29/12 0300 09/29/12 0500 09/29/12 0726  BP: 156/91 149/68    Pulse: 81 80    Temp: 97.4 F (36.3 C) 98.1 F (36.7 C)  97.6 F (36.4 C)  TempSrc: Oral Oral  Oral  Resp: 24 21    Height:      Weight:  159 lb 9.8 oz (72.4 kg) 159 lb 9.8 oz (72.4 kg)   SpO2: 97% 95%      Intake/Output from previous day:  Intake/Output Summary (Last 24 hours) at 09/29/12 4098 Last data filed at 09/29/12 0427  Gross per 24 hour  Intake    720 ml  Output   2400 ml  Net  -1680 ml    Physical Exam: Affect appropriate Bronchitic female HEENT: normal Neck supple with no adenopathy JVP elevated no bruits no thyromegaly Lungsbasilar crackles  with no wheezing and good diaphragmatic motion Heart:  S1/S2 no murmur, no rub, gallop or click PMI normal Abdomen: benighn, BS positve, no tenderness, no AAA no bruit.  No HSM or HJR Distal pulses intact with no bruits Plus 2 edema with erythema that is improved  Neuro non-focal Skin warm and dry No muscular weakness   Lab Results: Basic Metabolic Panel:  Recent Labs  11/91/47 0355 09/29/12 0530  NA 135 136  K 3.8 4.5  CL 92* 91*  CO2 35* 35*  GLUCOSE 109* 139*  BUN 28* 31*  CREATININE 1.42* 1.40*  CALCIUM 9.0 9.1   CBC: No results found for this basename: WBC, NEUTROABS, HGB, HCT, MCV, PLT,  in the last 72 hours  Imaging: Dg Chest 2 View  09/28/2012   *RADIOLOGY REPORT*  Clinical Data: Short of breath.  Smoking history.  Chest pain. Hypertension.  CHEST - 2 VIEW  Comparison: 09/25/2012  Findings: Artifact overlies chest.  There has been previous median sternotomy and CABG.  The cardiac silhouette  remains markedly enlarged consistent with either cardiomegaly or pericardial fluid. There is calcification and unfolding of the aorta.  There is venous hypertension without frank edema.  No effusions.  No acute bony findings.  IMPRESSION: Previous CABG.  Similar appearance of a chronically enlarged cardiac silhouette that could be due to cardiomegaly and/or pericardial fluid.  Probable venous hypertension without frank edema.   Original Report Authenticated By: Paulina Fusi, M.D.    Cardiac Studies:  ECG:    Telemetry:  NSR no VT 09/29/2012   Echo:  6/13  EF 15-20%  Medications:   . atorvastatin  40 mg Oral q1800  . budesonide-formoterol  2 puff Inhalation BID  . carvedilol  6.25 mg Oral Q12H  . clopidogrel  75 mg Oral Daily  . DULoxetine  60 mg Oral Daily  . enoxaparin (LOVENOX) injection  40 mg Subcutaneous Q24H  . insulin aspart  0-15 Units Subcutaneous TID WC  . insulin glargine  28 Units Subcutaneous QHS  . losartan  50 mg Oral Daily  . pantoprazole  40 mg Oral Daily  . pregabalin  50 mg Oral BID  . sodium chloride  3 mL Intravenous Q12H  . spironolactone  25 mg Oral Daily  . torsemide  40 mg  Oral BID       Assessment/Plan:  CHF:  Using IS her sats stay above 90%.  She continues to diurese well.  CXR improved  BNP much improved 16,000 to 5000 range Will discharge home With oxygen at night and demedex 40 bid and aldactone 25  Needs f/u BMET next week and f/u PA/Nahser 1-2 weeks Will also need referral to CHF clinic Will send this note to DB  DM:  Continue SS insuling HTN:  Continue ARB  HH referral done yesterday  Charlton Haws 09/29/2012, 7:52 AM

## 2012-09-29 NOTE — Care Management Note (Signed)
CARE MANAGEMENT NOTE 09/29/2012  Comments:  09/29/12 9:50am Vance Peper, RN BSN NCM [phone] CM spoke with patient. Oxygen has been ordered, Home Health has been arranged.

## 2012-09-29 NOTE — Progress Notes (Signed)
SATURATION QUALIFICATIONS: (This note is used to comply with regulatory documentation for home oxygen)  Patient Saturations on Room Air at Rest = 91%  Patient Saturations on Room Air while Ambulating = 86%  Patient Saturations on 2 Liters of oxygen while Ambulating = 94%  Please briefly explain why patient needs home oxygen: when patient sleeping sats drop to 85% on room air 2l/min nasal cannula applied sats increased to 94%.  [name]

## 2012-09-29 NOTE — Discharge Summary (Signed)
Discharge Summary   Patient ID: [name], MRN: [id number], DOB/AGE: March 25, 1946 67 y.o.  Admit date: 09/25/2012 Discharge date: 09/29/2012   Primary Care Physician:  ALM,STEPHANIE   Primary Cardiologist:  Dr. Delane Ginger   Reason for Admission:  Acute on Chronic Systolic CHF  Primary Discharge Diagnoses:  1. Acute on Chronic Systolic CHF - improved at discharge 2. 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 CAD, s/p CABG 4. Hypoxia - Patient d/c on Home O2  5. Chronic Kidney Disease - mild increase in creatinine this admission with diuresis  Secondary Discharge Diagnoses:   Past Medical History  Diagnosis Date  . Stroke 05/2011  . Coronary artery disease     a. s/p CABG 2007 (L-RI, S-dRCA, S-LAD, S-OM);  b. myoview 6/13:  anteroapical MI, no ischemia, EF 31%  . Diabetes mellitus   . Hypertension   . 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 Asthma   . Bronchitis   . Reflux   . Hyperlipidemia   . Chronic systolic heart failure     a. echo 6/13: mild LVH, EF 15-20%, diff HK, Gr 1 DD, mild reduced RVSF, PASP 32  . GERD (gastroesophageal reflux disease)   . Arthritis   . CKD (chronic kidney disease)      Allergies:    Allergies  Allergen Reactions  . Codeine     unknown  . Ivp Dye (Iodinated Diagnostic Agents)     unknown  . Penicillins     unknown  . Sulfa Antibiotics     unknown      Procedures Performed This Admission:   None   Hospital Course:  [name] is a 67 y.o. female with PMH of HTN, IDDM, CAD s/p CABG 2007 (LIMA-->RI, SVG-->dRCA, SVG-->LAD, SVG--OM), severe chronic systolic heart failure (LVEF 15-20% by TTE 10/2011), tobacco abuse  who presented with progressively worsening dyspnea, 4 pillow orthopnea, LE edema and weight gain.   BNP was elevated at 16,961.  She was admitted for IV diuresis of a/c systolic CHF.  She was initiated on Lasix 40 IV q 8 hours.  She diuresed a total of > 4 L this admission and weight decreased from 167 lbs to 159 lbs  by discharge.  Creatinine did rise from 1.13 on admission to a peak of 1.42.  She was transitioned to oral diuretics on 09/27/12.  Referral was made for [facility] for CHF f/u.  It was also recommended she be established in the CHF clinic after discharge for long term follow up and care of chronic systolic CHF.  Patient was noted to have episodes of hypoxia (especially at night).  She will be set up for home O2 at discharge.  She was seen by Dr. Charlton Haws this morning and felt ready for d/c to home with early f/u.      Discharge Vitals:   Blood pressure 137/74, pulse 76, temperature 97.6 F (36.4 C), temperature source Oral, resp. rate 16, height 5' (1.524 m), weight 159 lb 9.8 oz (72.4 kg), SpO2 97.00%.  Labs: No results found for this basename: WBC, HGB, HCT, MCV, PLT,  in the last 72 hours  Recent Labs  09/28/12 0355 09/29/12 0530  NA 135 136  K 3.8 4.5  CL 92* 91*  CO2 35* 35*  BUN 28* 31*  CREATININE 1.42* 1.40*  CALCIUM 9.0 9.1    Diagnostic Procedures and Studies:  Dg Chest 2 View  09/28/2012     IMPRESSION: Previous CABG.  Similar appearance  of a chronically enlarged cardiac silhouette that could be due to cardiomegaly and/or pericardial fluid.  Probable venous hypertension without frank edema.   Original Report Authenticated By: Paulina Fusi, M.D.   Dg Chest 2 View  09/25/2012      IMPRESSION: No evidence of acute cardiopulmonary disease.   Original Report Authenticated By: Charline Bills, M.D.    Disposition:   Pt is being discharged home today in good condition.  Follow-up Plans & Appointments      Follow-up Information   Follow up with Elyn Aquas., MD In 7 days. (office will call you for an appointment with [name] or Tereso Newcomer, PA-C)    Contact information:   [address] [place] [address] [phone]       Follow up with Arvilla Meres, MD. (we are referring you to the CHF clinic with Dr. Gala Romney - someone will call you to arrange  an appointment)    Contact information:   [address] [place] [address] [phone]       Follow up with ALM,STEPHANIE, MD. (as planned)    Contact information:   [address] [place] [address] [phone]       Discharge Medications    Medication List    STOP taking these medications       furosemide 40 MG tablet  Commonly known as:  LASIX      TAKE these medications       albuterol 108 (90 BASE) MCG/ACT inhaler  Commonly known as:  PROVENTIL HFA;VENTOLIN HFA  Inhale 2 puffs into the lungs every 6 (six) hours as needed. For shortness of breath/wheezing     ALPRAZolam 0.5 MG tablet  Commonly known as:  XANAX  Take 0.5 mg by mouth daily as needed. For anxiety     budesonide-formoterol 160-4.5 MCG/ACT inhaler  Commonly known as:  SYMBICORT  Inhale 2 puffs into the lungs 2 (two) times daily.     carvedilol 12.5 MG tablet  Commonly known as:  COREG  Take 0.5 tablets (6.25 mg total) by mouth every 12 (twelve) hours.     clopidogrel 75 MG tablet  Commonly known as:  PLAVIX  Take 75 mg by mouth daily.     diphenhydrAMINE 25 mg capsule  Commonly known as:  BENADRYL  Take 25 mg by mouth every 6 (six) hours as needed. For allergies     DULoxetine 60 MG capsule  Commonly known as:  CYMBALTA  Take 60 mg by mouth daily.     insulin glargine 100 UNIT/ML injection  Commonly known as:  LANTUS  Inject 28 Units into the skin at bedtime.     insulin lispro 100 UNIT/ML injection  Commonly known as:  HUMALOG  Inject 6-11 Units into the skin 3 (three) times daily before meals. Per sliding scale     losartan 50 MG tablet  Commonly known as:  COZAAR  Take 1 tablet (50 mg total) by mouth daily.     nitroGLYCERIN 0.4 MG SL tablet  Commonly known as:  NITROSTAT  Place 0.4 mg under the tongue every 5 (five) minutes as needed. For chest pain.     pantoprazole 40 MG tablet  Commonly known as:  PROTONIX  Take 40 mg by  mouth daily.     pregabalin 50 MG capsule  Commonly known as:  LYRICA  Take 50 mg by mouth 2 (two) times daily.     rosuvastatin 40 MG  tablet  Commonly known as:  CRESTOR  Take 40 mg by mouth daily.     spironolactone 25 MG tablet  Commonly known as:  ALDACTONE  Take 1 tablet (25 mg total) by mouth daily.     torsemide 20 MG tablet  Commonly known as:  DEMADEX  Take 2 tablets (40 mg total) by mouth 2 (two) times daily.        Outstanding Labs/Studies  1. BMET in 4 days  Duration of Discharge Encounter: Greater than 30 minutes including physician and PA time.  Luna Glasgow, PA-C  9:41 AM 09/29/2012

## 2012-09-30 ENCOUNTER — Telehealth: Payer: Self-pay | Admitting: Cardiovascular Disease

## 2012-09-30 NOTE — Progress Notes (Signed)
[name] was noted to have a lung nodule on myoview scan last year.  We tried to call her.  She failed to show up for follow up.  She was admitted to the hospital and was released .    She needs to see her medical doctor and may need a CT of the lungs.

## 2012-10-01 NOTE — Telephone Encounter (Signed)
New Prob      TOC scheduled per After Hours VM (Scott). 6/11 at 12 PM.

## 2012-10-01 NOTE — Telephone Encounter (Signed)
Patient contacted regarding discharge from hospital on 5/26.  Patient understands to follow up with provider Jacolyn Reedy, PA on 6/11 at 12:00 at [facility]. Patient understands discharge instructions? Yes Patient understands medications and regiment? Yes  Patient understands to bring all medications to this visit? Yes  Patient reports she is feeling well; denies SOB.  Patient states feet are a little swollen today but this is the first day she has been up on them.  Patient is awaiting call from Dr. Prescott Gum office for f/u.

## 2012-10-02 ENCOUNTER — Other Ambulatory Visit: Payer: Medicare Other

## 2012-10-06 ENCOUNTER — Telehealth: Payer: Self-pay | Admitting: Cardiovascular Disease

## 2012-10-06 NOTE — Telephone Encounter (Signed)
New problem    Can't find the pt and when they did she refused the care visit so they're going to discharge her

## 2012-10-06 NOTE — Telephone Encounter (Signed)
Spoke with Dois Davenport. They had attempted to contact pt 5/26 and 5/27. They did see pt 5/28 but she refused care and asked to reschedule the appt. They attempted to contact patient again 5/29 and 6/2 and have not been able to get in touch with her. I will forward to Dr Elease Hashimoto.

## 2012-10-07 NOTE — Telephone Encounter (Signed)
Noted, I have had difficulty making contact in past. See notes

## 2012-10-08 NOTE — Telephone Encounter (Signed)
I SEE AN APP HAS BEEN MADE 10/15/12

## 2012-10-15 ENCOUNTER — Encounter: Payer: Medicare Other | Admitting: Physician Assistant

## 2012-12-19 ENCOUNTER — Other Ambulatory Visit: Payer: Self-pay | Admitting: Family Medicine

## 2012-12-19 ENCOUNTER — Ambulatory Visit (INDEPENDENT_AMBULATORY_CARE_PROVIDER_SITE_OTHER): Payer: 59 | Admitting: Family Medicine

## 2012-12-19 ENCOUNTER — Ambulatory Visit: Payer: Medicare Other

## 2012-12-19 ENCOUNTER — Ambulatory Visit (HOSPITAL_COMMUNITY)
Admission: RE | Admit: 2012-12-19 | Discharge: 2012-12-19 | Disposition: A | Discharge status: Home / Self Care | Payer: Medicare Other | Source: Ambulatory Visit | Attending: Family Medicine | Admitting: Family Medicine

## 2012-12-19 VITALS — BP 124/84 | HR 94 | Temp 97.7°F | Resp 17

## 2012-12-19 DIAGNOSIS — M25551 Pain in right hip: Secondary | ICD-10-CM

## 2012-12-19 DIAGNOSIS — E119 Type 2 diabetes mellitus without complications: Secondary | ICD-10-CM

## 2012-12-19 DIAGNOSIS — M7989 Other specified soft tissue disorders: Secondary | ICD-10-CM | POA: Insufficient documentation

## 2012-12-19 DIAGNOSIS — M25571 Pain in right ankle and joints of right foot: Secondary | ICD-10-CM

## 2012-12-19 DIAGNOSIS — M25579 Pain in unspecified ankle and joints of unspecified foot: Secondary | ICD-10-CM

## 2012-12-19 DIAGNOSIS — I2510 Atherosclerotic heart disease of native coronary artery without angina pectoris: Secondary | ICD-10-CM | POA: Insufficient documentation

## 2012-12-19 DIAGNOSIS — M25559 Pain in unspecified hip: Secondary | ICD-10-CM

## 2012-12-19 DIAGNOSIS — M25561 Pain in right knee: Secondary | ICD-10-CM

## 2012-12-19 DIAGNOSIS — M79609 Pain in unspecified limb: Secondary | ICD-10-CM | POA: Insufficient documentation

## 2012-12-19 DIAGNOSIS — M25569 Pain in unspecified knee: Secondary | ICD-10-CM

## 2012-12-19 DIAGNOSIS — L819 Disorder of pigmentation, unspecified: Secondary | ICD-10-CM

## 2012-12-19 LAB — POCT CBC
Granulocyte percent: 75.7 % (ref 37–80)
HCT, POC: 42.9 % (ref 37.7–47.9)
Hemoglobin: 13.7 g/dL (ref 12.2–16.2)
Lymph, poc: 1.7 (ref 0.6–3.4)
MCH, POC: 29.4 pg (ref 27–31.2)
MCHC: 31.9 g/dL (ref 31.8–35.4)
MCV: 92 fL (ref 80–97)
MID (cbc): 0.6 (ref 0–0.9)
MPV: 11.2 fL (ref 0–99.8)
POC Granulocyte: 7 — AB (ref 2–6.9)
POC LYMPH PERCENT: 18.2 % (ref 10–50)
POC MID %: 6.1 %M (ref 0–12)
Platelet Count, POC: 162 10*3/uL (ref 142–424)
RBC: 4.66 M/uL (ref 4.04–5.48)
RDW, POC: 18.2 %
WBC: 9.2 10*3/uL (ref 4.6–10.2)

## 2012-12-19 LAB — POCT GLYCOSYLATED HEMOGLOBIN (HGB A1C): Hemoglobin A1C: 8.2

## 2012-12-19 MED ORDER — HYDROCODONE-ACETAMINOPHEN 5-325 MG PO TABS: 1.0000 | ORAL_TABLET | Freq: Three times a day (TID) | ORAL | Status: DC | PRN

## 2012-12-19 NOTE — Progress Notes (Signed)
 [facility]:  Office Visit  Chief Complaint:  Chief Complaint  Patient presents with  . numbness and redness in right foot and leg    fell 3 weeks ago     HPI: [name] is a 67 y.o. female who complains of  Right Foot pain and discoloartion Fell 3 weeks ago while trying to get out of bed to go to bathroom at night, fell down and fell on right hip ( which she has a hip replacement on), it took her 30 minutes to get up.  Right hip has been hurting, right foot and leg hurts as well.  She has had worsening  right leg, and foot pain x 3 days Her toes are cold and feels "dead". Can't keep anything on her feet due to pain.  + fevers and chills She deneis having numbness or tingling in right leg/foot prior to the fall.  She denies PAD,  She has CAD, CKD, and DM , + tobacco use She has not taken her blood sugars since she got a new glucose monitor due to touch screen She has been compliant with her meds but does not know her HbA1c PCP is Dr. Kateri Plummer with [facility]   Past Medical History  Diagnosis Date  . Stroke 05/2011  . Coronary artery disease     a. s/p CABG 2007 (L-RI, S-dRCA, S-LAD, S-OM);  b. myoview 6/13:  anteroapical MI, no ischemia, EF 31%  . Diabetes mellitus   . Hypertension   . 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 Asthma   . Bronchitis   . Reflux   . Hyperlipidemia   . Chronic systolic heart failure     a. echo 6/13: mild LVH, EF 15-20%, diff HK, Gr 1 DD, mild reduced RVSF, PASP 32  . GERD (gastroesophageal reflux disease)   . Arthritis   . CKD (chronic kidney disease)    Past Surgical History  Procedure Laterality Date  . Coronary artery bypass graft      LIMA to RI, SVG to distal RCA, SVG to LAD, SVG to OM. 2007  . Joint replacement      Right hip replacement   . Debridement skin / sq / muscle of trunk      s/p staph infection from CABG 2007  . Cystectomy      Subcutaneous  . Partial hip arthroplasty    . Stroke     History    Social History  . Marital Status: Divorced    Spouse Name: N/A    Number of Children: 4  . Years of Education: N/A   Occupational History  . Retired  [employer]    [employer]   Social History Main Topics  . Smoking status: Current Every Day Smoker -- 0.80 packs/day for 50 years    Types: Cigarettes  . Smokeless tobacco: Never Used  . Alcohol Use: No  . Drug Use: No  . Sexual Activity: None   Other Topics Concern  . None   Social History Narrative   Lives with grandson 56 years old.   Family History  Problem Relation Age of Onset  . Coronary artery disease Father 76    Unclear if it was an MI  . Stroke Mother 50  . Coronary artery disease Brother 35  . Coronary artery disease Brother 61   Allergies  Allergen Reactions  . Codeine     unknown  . Ivp Dye [Iodinated Diagnostic Agents]  unknown  . Penicillins     unknown  . Sulfa Antibiotics     unknown   Prior to Admission medications   Medication Sig Start Date End Date Taking? Authorizing Provider  albuterol (PROVENTIL HFA;VENTOLIN HFA) 108 (90 BASE) MCG/ACT inhaler Inhale 2 puffs into the lungs every 6 (six) hours as needed. For shortness of breath/wheezing   Yes Historical Provider, MD  ALPRAZolam Prudy Feeler) 0.5 MG tablet Take 0.5 mg by mouth daily as needed. For anxiety   Yes Historical Provider, MD  budesonide-formoterol (SYMBICORT) 160-4.5 MCG/ACT inhaler Inhale 2 puffs into the lungs 2 (two) times daily.    Yes Historical Provider, MD  carvedilol (COREG) 12.5 MG tablet Take 0.5 tablets (6.25 mg total) by mouth every 12 (twelve) hours. 04/17/12 04/17/13 Yes Rhetta Mura, MD  clopidogrel (PLAVIX) 75 MG tablet Take 75 mg by mouth daily.   Yes Historical Provider, MD  diphenhydrAMINE (BENADRYL) 25 mg capsule Take 25 mg by mouth every 6 (six) hours as needed. For allergies   Yes Historical Provider, MD  DULoxetine (CYMBALTA) 60 MG capsule Take 60 mg by mouth daily.   Yes Historical Provider, MD  insulin  glargine (LANTUS) 100 UNIT/ML injection Inject 28 Units into the skin at bedtime.   Yes Historical Provider, MD  insulin lispro (HUMALOG) 100 UNIT/ML injection Inject 6-11 Units into the skin 3 (three) times daily before meals. Per sliding scale   Yes Historical Provider, MD  losartan (COZAAR) 50 MG tablet Take 1 tablet (50 mg total) by mouth daily. 09/29/12  Yes Scott Moishe Spice, PA-C  nitroGLYCERIN (NITROSTAT) 0.4 MG SL tablet Place 0.4 mg under the tongue every 5 (five) minutes as needed. For chest pain.   Yes Historical Provider, MD  pantoprazole (PROTONIX) 40 MG tablet Take 40 mg by mouth daily.   Yes Historical Provider, MD  pregabalin (LYRICA) 50 MG capsule Take 50 mg by mouth 2 (two) times daily.   Yes Historical Provider, MD  rosuvastatin (CRESTOR) 40 MG tablet Take 40 mg by mouth daily.   Yes Historical Provider, MD  spironolactone (ALDACTONE) 25 MG tablet Take 1 tablet (25 mg total) by mouth daily. 09/29/12  Yes Scott T Alben Spittle, PA-C  torsemide (DEMADEX) 20 MG tablet Take 2 tablets (40 mg total) by mouth 2 (two) times daily. 09/29/12  Yes Scott T Alben Spittle, PA-C     ROS: The patient denies fevers, chills, night sweats, unintentional weight loss, chest pain, palpitations, wheezing, dyspnea on exertion, nausea, vomiting, abdominal pain, dysuria, hematuria, melena, + numbness, weakness, or tingling.   All other systems have been reviewed and were otherwise negative with the exception of those mentioned in the HPI and as above.    PHYSICAL EXAM: Filed Vitals:   12/19/12 1454  BP: 124/84  Pulse: 94  Temp: 97.7 F (36.5 C)  Resp: 17   There were no vitals filed for this visit. There is no weight on file to calculate BMI.  General: Alert, no acute distress HEENT:  Normocephalic, atraumatic, oropharynx patent. EOMI, PERRLA Cardiovascular:  Regular rate and rhythm, no rubs murmurs or gallops.  No Carotid bruits, radial pulse intact. No pedal edema.  Respiratory: Clear to auscultation  bilaterally.  No wheezes, rales, or rhonchi.  No cyanosis, no use of accessory musculature GI: No organomegaly, abdomen is soft and non-tender, positive bowel sounds.  No masses. Skin: No rashes. Neurologic: Facial musculature symmetric. Psychiatric: Patient is appropriate throughout our interaction. Lymphatic: No cervical lymphadenopathy Musculoskeletal: Gait in wheelchair. + right hip pain  on palpation, able to IR/ER Normal knee + right leg swelling, discoloration, ruddy redness almost burnt color but not quite purple Unable to find DP Disproportionately tender in calf, ankle, and foot Able to wiggle toes  Pain with plantar and dorsiflexion Warm and blanches, does not look like typical cellulitis WBC is normal   LABS: Results for orders placed in visit on 12/19/12  POCT GLYCOSYLATED HEMOGLOBIN (HGB A1C)      Result Value Range   Hemoglobin A1C 8.2    POCT CBC      Result Value Range   WBC 9.2  4.6 - 10.2 K/uL   Lymph, poc 1.7  0.6 - 3.4   POC LYMPH PERCENT 18.2  10 - 50 %L   MID (cbc) 0.6  0 - 0.9   POC MID % 6.1  0 - 12 %M   POC Granulocyte 7.0 (*) 2 - 6.9   Granulocyte percent 75.7  37 - 80 %G   RBC 4.66  4.04 - 5.48 M/uL   Hemoglobin 13.7  12.2 - 16.2 g/dL   HCT, POC 04.5  40.9 - 47.9 %   MCV 92.0  80 - 97 fL   MCH, POC 29.4  27 - 31.2 pg   MCHC 31.9  31.8 - 35.4 g/dL   RDW, POC 81.1     Platelet Count, POC 162  142 - 424 K/uL   MPV 11.2  0 - 99.8 fL     EKG/XRAY:   Primary read interpreted by Dr. Conley Rolls at [facility]. Hip-negative for fx or dislocation, prosthesis in place Tib fib-negative for fx or dislocation Ankle-negative for fx or dislocation Foot-negative for fx or dislocation   ASSESSMENT/PLAN: Encounter Diagnoses  Name Primary?  . Hip pain, right Yes  . Pain in joint, lower leg, right   . Pain in joint, ankle and foot, right   . Diabetes   . Discoloration of skin of lower leg    I do not suspect that this is cellulitic in etiology WBC is normal , she  states once she elevates the leg it has the same color as the other one.  She has lower extremity pain out of proportion to what is normal She has had CABG where they have used what looks like saphenous vein graft on xray She has discoloration of her right  She has a history of CAD, DM, CKD Will send her out for rule out DVT and also evaluate for PAD Will call in pain meds if her  ultrasound is negative for clot If she has PAD then need urgent referral to vascular I do not want to start her on neurontin, she is already on lyrica Gross sideeffects, risk and benefits, and alternatives of medications d/w patient. Patient is aware that all medications have potential sideeffects and we are unable to predict every sideeffect or drug-drug interaction that may occur.  Hamilton Capri PHUONG, DO 12/19/2012 4:05 PM  12/19/12 @ 6:12 Spoke with patietn about doppler results. Prelim shows no DVT, she has decrease arterial flow after knee joint. Still has flow but decrease, + interstial fluid. She has been out of her torsemide, needs to pick up some more.I will call in hydrocodone for pain. She has taken it before without problems. Refer for stat vascular consult on Monday. Gross sideeffects, risk and benefits, and alternatives of medications d/w patient. Patient is aware that all medications have potential sideeffects and we are unable to predict every sideeffect or drug-drug interaction that may occur.

## 2012-12-19 NOTE — Progress Notes (Signed)
VASCULAR LAB PRELIMINARY  PRELIMINARY  PRELIMINARY  PRELIMINARY  Right lower extremity venous duplex completed.    Preliminary report:  No obvious DVT noted in the right lower extremity.  Note: Decreased arterial flow noted in the right foot and the popliteal area.  Results called to Dr. Conley Rolls.  Instructions given to patient by Dr. Delena Bali, Bailynn Dyk, RVT 12/19/2012, 6:53 PM

## 2012-12-20 ENCOUNTER — Telehealth: Payer: Self-pay | Admitting: Radiology

## 2012-12-20 NOTE — Telephone Encounter (Signed)
We throw away all blood at the end of the night.  Im sorry.  If we had sent out any labs we could add on but it doesn't look like you ordered any outside labs :( -Magda Paganini

## 2012-12-20 NOTE — Telephone Encounter (Signed)
Message copied by Sheldon Silvan on Sat Dec 20, 2012 10:20 AM ------      Message from: Lenell Antu      Created: Sat Dec 20, 2012  8:16 AM       Are we able to add a CMP to her blood work? I am not sure if you guys threw it out already. Thanks Tle ------

## 2012-12-23 ENCOUNTER — Other Ambulatory Visit: Payer: Self-pay | Admitting: *Deleted

## 2012-12-23 DIAGNOSIS — I739 Peripheral vascular disease, unspecified: Secondary | ICD-10-CM

## 2012-12-24 ENCOUNTER — Encounter: Payer: Self-pay | Admitting: Vascular Surgery

## 2012-12-25 ENCOUNTER — Encounter (INDEPENDENT_AMBULATORY_CARE_PROVIDER_SITE_OTHER): Payer: Medicare Other | Admitting: *Deleted

## 2012-12-25 ENCOUNTER — Encounter: Payer: Self-pay | Admitting: Vascular Surgery

## 2012-12-25 ENCOUNTER — Ambulatory Visit (INDEPENDENT_AMBULATORY_CARE_PROVIDER_SITE_OTHER): Payer: Medicare Other | Admitting: Vascular Surgery

## 2012-12-25 VITALS — BP 119/89 | HR 73 | Ht 60.0 in | Wt 159.0 lb

## 2012-12-25 DIAGNOSIS — I739 Peripheral vascular disease, unspecified: Secondary | ICD-10-CM

## 2012-12-25 NOTE — Progress Notes (Signed)
[facility] [place] HISTORY AND PHYSICAL   History of Present Illness:  Patient is a 66 y.o. year old female who presents for evaluation of right foot ischemia. The patient has complained of right foot heaviness and a "dead sensation" and purplish color changes since having a fall 3 weeks ago. She also reports decreased range of motion. She denies prior history of claudication. However she does not walk much. She has fairly severe heart failure with her last ejection fraction being 15-20%. She is followed by Dr. not certain cardiology. There considering transferring her care to Dr. Bensimhon in the heart failure clinic. Other medical problems include coronary artery disease, diabetes, hypertension, asthma, bronchitis, renal dysfunction (creatinine 1.4 in May). The patient is on home oxygen at night.  Past Medical History  Diagnosis Date  . Stroke 05/2011  . Coronary artery disease     a. s/p CABG 2007 (L-RI, S-dRCA, S-LAD, S-OM);  b. myoview 6/13:  anteroapical MI, no ischemia, EF 31%  . Diabetes mellitus   . Hypertension   . 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 Asthma   . Bronchitis   . Reflux   . Hyperlipidemia   . Chronic systolic heart failure     a. echo 6/13: mild LVH, EF 15-20%, diff HK, Gr 1 DD, mild reduced RVSF, PASP 32  . GERD (gastroesophageal reflux disease)   . Arthritis   . CKD (chronic kidney disease)     Past Surgical History  Procedure Laterality Date  . Coronary artery bypass graft      LIMA to RI, SVG to distal RCA, SVG to LAD, SVG to OM. 2007  . Joint replacement      Right hip replacement   . Debridement skin / sq / muscle of trunk      s/p staph infection from CABG 2007  . Cystectomy      Subcutaneous  . Partial hip arthroplasty    . Stroke       Social History History  Substance Use Topics  . Smoking status: Current Every Day Smoker -- 0.50 packs/day for 50 years    Types: Cigarettes  . Smokeless tobacco: Never Used  .  Alcohol Use: No    Family History Family History  Problem Relation Age of Onset  . Coronary artery disease Father 52    Unclear if it was an MI  . Stroke Mother 80  . Coronary artery disease Brother 68  . Coronary artery disease Brother 68    Allergies  Allergies  Allergen Reactions  . Codeine     unknown  . Ivp Dye [Iodinated Diagnostic Agents]     unknown  . Penicillins     unknown  . Sulfa Antibiotics     unknown     Current Outpatient Prescriptions  Medication Sig Dispense Refill  . albuterol (PROVENTIL HFA;VENTOLIN HFA) 108 (90 BASE) MCG/ACT inhaler Inhale 2 puffs into the lungs every 6 (six) hours as needed. For shortness of breath/wheezing      . ALPRAZolam (XANAX) 0.5 MG tablet Take 0.5 mg by mouth daily as needed. For anxiety      . budesonide-formoterol (SYMBICORT) 160-4.5 MCG/ACT inhaler Inhale 2 puffs into the lungs 2 (two) times daily.       . carvedilol (COREG) 12.5 MG tablet Take 0.5 tablets (6.25 mg total) by mouth every 12 (twelve) hours.  60 tablet    . clopidogrel (PLAVIX) 75 MG tablet Take 75 mg by mouth   daily.      . diphenhydrAMINE (BENADRYL) 25 mg capsule Take 25 mg by mouth every 6 (six) hours as needed. For allergies      . DULoxetine (CYMBALTA) 60 MG capsule Take 60 mg by mouth daily.      . HYDROcodone-acetaminophen (NORCO) 5-325 MG per tablet Take 1 tablet by mouth every 8 (eight) hours as needed for pain.  30 tablet  0  . insulin glargine (LANTUS) 100 UNIT/ML injection Inject 28 Units into the skin at bedtime.      . insulin lispro (HUMALOG) 100 UNIT/ML injection Inject 6-11 Units into the skin 3 (three) times daily before meals. Per sliding scale      . losartan (COZAAR) 50 MG tablet Take 1 tablet (50 mg total) by mouth daily.  30 tablet  5  . nitroGLYCERIN (NITROSTAT) 0.4 MG SL tablet Place 0.4 mg under the tongue every 5 (five) minutes as needed. For chest pain.      . pantoprazole (PROTONIX) 40 MG tablet Take 40 mg by mouth daily.      .  pregabalin (LYRICA) 50 MG capsule Take 50 mg by mouth 2 (two) times daily.      . rosuvastatin (CRESTOR) 40 MG tablet Take 40 mg by mouth daily.      . spironolactone (ALDACTONE) 25 MG tablet Take 1 tablet (25 mg total) by mouth daily.  30 tablet  5  . torsemide (DEMADEX) 20 MG tablet Take 2 tablets (40 mg total) by mouth 2 (two) times daily.  120 tablet  5  . [DISCONTINUED] potassium chloride (K-DUR,KLOR-CON) 10 MEQ tablet Take 10 mEq by mouth daily.        No current facility-administered medications for this visit.    ROS:   General:  No weight loss, Fever, chills  HEENT: No recent headaches, no nasal bleeding, no visual changes, no sore throat  Neurologic: No dizziness, blackouts, seizures. No recent symptoms of stroke or mini- stroke. No recent episodes of slurred speech, or temporary blindness.  Cardiac: No recent episodes of chest pain/pressure, no shortness of breath at rest.  No shortness of breath with exertion.  Denies history of atrial fibrillation or irregular heartbeat  Vascular: No history of rest pain in feet.  No history of claudication.  No history of non-healing ulcer, No history of DVT   Pulmonary: No home oxygen, no productive cough, no hemoptysis,  No asthma or wheezing  Musculoskeletal:  [ ] Arthritis, [ ] Low back pain,  [ ] Joint pain  Hematologic:No history of hypercoagulable state.  No history of easy bleeding.  No history of anemia  Gastrointestinal: No hematochezia or melena,  No gastroesophageal reflux, no trouble swallowing  Urinary: [ ] chronic Kidney disease, [ ] on HD - [ ] MWF or [ ] TTHS, [ ] Burning with urination, [ ] Frequent urination, [ ] Difficulty urinating;   Skin: No rashes  Psychological: No history of anxiety,  No history of depression   Physical Examination  Filed Vitals:   12/25/12 1200  BP: 119/89  Pulse: 73  Height: 5' (1.524 m)  Weight: 159 lb (72.122 kg)  SpO2: 100%    Body mass index is 31.05 kg/(m^2).  General:   Alert and oriented, no acute distress HEENT: Normal Neck: No bruit or JVD Pulmonary: Clear to auscultation bilaterally Cardiac: Regular Rate and Rhythm without murmur Abdomen: Soft, non-tender, non-distended, no mass Skin: No rash, bluish congested appearance to both feet suggestive of congestive failure. However she   also has dependent rubor of the right foot no ulcerations Extremity Pulses:  2+ radial, brachial, absent femoral, dorsalis pedis, posterior tibial pulses bilaterally Musculoskeletal: No deformity or edema  Neurologic: Upper and lower extremity motor 5/5 and symmetric  DATA: Duplex ultrasound dated August 21 is reviewed today. I reviewed and interpreted this study. She also had bilateral ABIs. 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 Her ABIs in the appearance of her foot is consistent with possible limb threatening ischemia.   PLAN:  I offered the patient aortogram and lower extremity runoff with possible intervention tomorrow. However she wishes to defer this to August 29. Risks benefits possible complications and procedure details including but not limited to vessel injury bleeding infection renal dysfunction were explained the patient today. She understands and agrees to proceed.  If the lesion is not amenable to percutaneous intervention she would need review by cardiology prior to considering bypass.   Tobacco cessation was emphasized and greater than 3 minutes they were spent regarding smoking cessation counseling.  Charles Fields, MD [facility] [place] Office: [phone] Pager: [phone]   

## 2012-12-26 ENCOUNTER — Other Ambulatory Visit: Payer: Self-pay

## 2012-12-31 ENCOUNTER — Encounter (HOSPITAL_COMMUNITY): Payer: Self-pay | Admitting: Pharmacy Technician

## 2013-01-02 ENCOUNTER — Encounter (HOSPITAL_COMMUNITY): Payer: Self-pay | Admitting: Physician Assistant

## 2013-01-02 ENCOUNTER — Ambulatory Visit (HOSPITAL_COMMUNITY)
Admission: RE | Admit: 2013-01-02 | Discharge: 2013-01-02 | Disposition: A | Discharge status: Home / Self Care | Payer: Medicare Other | Source: Ambulatory Visit | Attending: Vascular Surgery | Admitting: Vascular Surgery

## 2013-01-02 ENCOUNTER — Encounter (HOSPITAL_COMMUNITY)
Admission: RE | Disposition: A | Discharge status: Home / Self Care | Payer: Self-pay | Source: Ambulatory Visit | Attending: Vascular Surgery

## 2013-01-02 DIAGNOSIS — I70229 Atherosclerosis of native arteries of extremities with rest pain, unspecified extremity: Secondary | ICD-10-CM | POA: Insufficient documentation

## 2013-01-02 DIAGNOSIS — I252 Old myocardial infarction: Secondary | ICD-10-CM | POA: Insufficient documentation

## 2013-01-02 DIAGNOSIS — Z794 Long term (current) use of insulin: Secondary | ICD-10-CM | POA: Insufficient documentation

## 2013-01-02 DIAGNOSIS — Z951 Presence of aortocoronary bypass graft: Secondary | ICD-10-CM | POA: Insufficient documentation

## 2013-01-02 DIAGNOSIS — I639 Cerebral infarction, unspecified: Secondary | ICD-10-CM

## 2013-01-02 DIAGNOSIS — I5022 Chronic systolic (congestive) heart failure: Secondary | ICD-10-CM | POA: Insufficient documentation

## 2013-01-02 DIAGNOSIS — I129 Hypertensive chronic kidney disease with stage 1 through stage 4 chronic kidney disease, or unspecified chronic kidney disease: Secondary | ICD-10-CM | POA: Insufficient documentation

## 2013-01-02 DIAGNOSIS — E785 Hyperlipidemia, unspecified: Secondary | ICD-10-CM | POA: Insufficient documentation

## 2013-01-02 DIAGNOSIS — Z91041 Radiographic dye allergy status: Secondary | ICD-10-CM | POA: Insufficient documentation

## 2013-01-02 DIAGNOSIS — Z880 Allergy status to penicillin: Secondary | ICD-10-CM | POA: Insufficient documentation

## 2013-01-02 DIAGNOSIS — N189 Chronic kidney disease, unspecified: Secondary | ICD-10-CM | POA: Insufficient documentation

## 2013-01-02 DIAGNOSIS — Z885 Allergy status to narcotic agent status: Secondary | ICD-10-CM | POA: Insufficient documentation

## 2013-01-02 DIAGNOSIS — J9600 Acute respiratory failure, unspecified whether with hypoxia or hypercapnia: Secondary | ICD-10-CM

## 2013-01-02 DIAGNOSIS — I7092 Chronic total occlusion of artery of the extremities: Secondary | ICD-10-CM | POA: Insufficient documentation

## 2013-01-02 DIAGNOSIS — Z8673 Personal history of transient ischemic attack (TIA), and cerebral infarction without residual deficits: Secondary | ICD-10-CM | POA: Insufficient documentation

## 2013-01-02 DIAGNOSIS — R079 Chest pain, unspecified: Secondary | ICD-10-CM

## 2013-01-02 DIAGNOSIS — Z7902 Long term (current) use of antithrombotics/antiplatelets: Secondary | ICD-10-CM | POA: Insufficient documentation

## 2013-01-02 DIAGNOSIS — Z882 Allergy status to sulfonamides status: Secondary | ICD-10-CM | POA: Insufficient documentation

## 2013-01-02 DIAGNOSIS — Z79899 Other long term (current) drug therapy: Secondary | ICD-10-CM | POA: Insufficient documentation

## 2013-01-02 DIAGNOSIS — K219 Gastro-esophageal reflux disease without esophagitis: Secondary | ICD-10-CM | POA: Insufficient documentation

## 2013-01-02 DIAGNOSIS — I739 Peripheral vascular disease, unspecified: Secondary | ICD-10-CM

## 2013-01-02 DIAGNOSIS — E119 Type 2 diabetes mellitus without complications: Secondary | ICD-10-CM | POA: Insufficient documentation

## 2013-01-02 DIAGNOSIS — I2510 Atherosclerotic heart disease of native coronary artery without angina pectoris: Secondary | ICD-10-CM | POA: Insufficient documentation

## 2013-01-02 DIAGNOSIS — J45909 Unspecified asthma, uncomplicated: Secondary | ICD-10-CM | POA: Insufficient documentation

## 2013-01-02 DIAGNOSIS — F17200 Nicotine dependence, unspecified, uncomplicated: Secondary | ICD-10-CM | POA: Insufficient documentation

## 2013-01-02 DIAGNOSIS — I6350 Cerebral infarction due to unspecified occlusion or stenosis of unspecified cerebral artery: Secondary | ICD-10-CM

## 2013-01-02 HISTORY — DX: Other chronic pain: G89.29

## 2013-01-02 HISTORY — DX: Abnormal result of other cardiovascular function study: R94.39

## 2013-01-02 HISTORY — PX: ABDOMINAL AORTAGRAM: SHX5454

## 2013-01-02 HISTORY — DX: Dorsalgia, unspecified: M54.9

## 2013-01-02 HISTORY — DX: Hypoxemia: R09.02

## 2013-01-02 LAB — BLOOD GAS, ARTERIAL
Acid-Base Excess: 2.5 mmol/L — ABNORMAL HIGH (ref 0.0–2.0)
Bicarbonate: 28.3 mEq/L — ABNORMAL HIGH (ref 20.0–24.0)
TCO2: 30.2 mmol/L (ref 0–100)
pCO2 arterial: 59.5 mmHg (ref 35.0–45.0)
pH, Arterial: 7.299 — ABNORMAL LOW (ref 7.350–7.450)
pO2, Arterial: 72.7 mmHg — ABNORMAL LOW (ref 80.0–100.0)

## 2013-01-02 LAB — POCT I-STAT, CHEM 8
Calcium, Ion: 1.15 mmol/L (ref 1.13–1.30)
Glucose, Bld: 247 mg/dL — ABNORMAL HIGH (ref 70–99)
HCT: 45 % (ref 36.0–46.0)
Hemoglobin: 15.3 g/dL — ABNORMAL HIGH (ref 12.0–15.0)

## 2013-01-02 SURGERY — ABDOMINAL AORTAGRAM
Anesthesia: LOCAL

## 2013-01-02 MED ORDER — METOPROLOL TARTRATE 1 MG/ML IV SOLN: 2.0000 mg | INTRAVENOUS | Status: DC | PRN

## 2013-01-02 MED ORDER — LABETALOL HCL 5 MG/ML IV SOLN: 10.0000 mg | INTRAVENOUS | Status: DC | PRN

## 2013-01-02 MED ORDER — MORPHINE SULFATE 10 MG/ML IJ SOLN: 2.0000 mg | INTRAMUSCULAR | Status: DC | PRN

## 2013-01-02 MED ORDER — PHENOL 1.4 % MT LIQD: 1.0000 | OROMUCOSAL | Status: DC | PRN

## 2013-01-02 MED ORDER — PANTOPRAZOLE SODIUM 40 MG PO TBEC: 40.0000 mg | DELAYED_RELEASE_TABLET | Freq: Every day | ORAL | Status: DC

## 2013-01-02 MED ORDER — GUAIFENESIN-DM 100-10 MG/5ML PO SYRP: 15.0000 mL | ORAL_SOLUTION | ORAL | Status: DC | PRN

## 2013-01-02 MED ORDER — ALBUTEROL SULFATE (5 MG/ML) 0.5% IN NEBU: 2.5000 mg | INHALATION_SOLUTION | Freq: Four times a day (QID) | RESPIRATORY_TRACT | Status: DC | PRN

## 2013-01-02 MED ORDER — FAMOTIDINE IN NACL 20-0.9 MG/50ML-% IV SOLN
INTRAVENOUS | Status: AC
  Filled 2013-01-02: qty 50

## 2013-01-02 MED ORDER — SODIUM CHLORIDE 0.45 % IV SOLN
INTRAVENOUS | Status: DC
  Administered 2013-01-02: 250 mL via INTRAVENOUS

## 2013-01-02 MED ORDER — LIDOCAINE HCL (PF) 1 % IJ SOLN
INTRAMUSCULAR | Status: AC
  Filled 2013-01-02: qty 30

## 2013-01-02 MED ORDER — FAMOTIDINE IN NACL 20-0.9 MG/50ML-% IV SOLN
20.0000 mg | INTRAVENOUS | Status: AC
  Administered 2013-01-02: 20 mg via INTRAVENOUS

## 2013-01-02 MED ORDER — ALUM & MAG HYDROXIDE-SIMETH 200-200-20 MG/5ML PO SUSP: 15.0000 mL | ORAL | Status: DC | PRN

## 2013-01-02 MED ORDER — OXYCODONE HCL 5 MG PO TABS: 5.0000 mg | ORAL_TABLET | ORAL | Status: DC | PRN

## 2013-01-02 MED ORDER — DIPHENHYDRAMINE HCL 50 MG/ML IJ SOLN
25.0000 mg | INTRAMUSCULAR | Status: AC
  Administered 2013-01-02: 25 mg via INTRAVENOUS

## 2013-01-02 MED ORDER — ACETAMINOPHEN 325 MG PO TABS: 325.0000 mg | ORAL_TABLET | ORAL | Status: DC | PRN

## 2013-01-02 MED ORDER — HYDRALAZINE HCL 20 MG/ML IJ SOLN: 10.0000 mg | INTRAMUSCULAR | Status: DC | PRN

## 2013-01-02 MED ORDER — METHYLPREDNISOLONE SODIUM SUCC 125 MG IJ SOLR
INTRAMUSCULAR | Status: AC
  Filled 2013-01-02: qty 2

## 2013-01-02 MED ORDER — DIPHENHYDRAMINE HCL 50 MG/ML IJ SOLN
INTRAMUSCULAR | Status: AC
  Filled 2013-01-02: qty 1

## 2013-01-02 MED ORDER — SODIUM CHLORIDE 0.9 % IV SOLN
INTRAVENOUS | Status: DC
  Administered 2013-01-02: 06:00:00 via INTRAVENOUS

## 2013-01-02 MED ORDER — ONDANSETRON HCL 4 MG/2ML IJ SOLN: 4.0000 mg | Freq: Four times a day (QID) | INTRAMUSCULAR | Status: DC | PRN

## 2013-01-02 MED ORDER — METHYLPREDNISOLONE SODIUM SUCC 125 MG IJ SOLR
125.0000 mg | INTRAMUSCULAR | Status: AC
  Administered 2013-01-02: 125 mg via INTRAVENOUS

## 2013-01-02 MED ORDER — ONDANSETRON HCL 4 MG/2ML IJ SOLN
INTRAMUSCULAR | Status: AC
  Filled 2013-01-02: qty 2

## 2013-01-02 MED ORDER — ACETAMINOPHEN 325 MG RE SUPP: 325.0000 mg | RECTAL | Status: DC | PRN

## 2013-01-02 NOTE — H&P (View-Only) (Signed)
[facility] [place] HISTORY AND PHYSICAL   History of Present Illness:  Patient is a 67 y.o. year old female who presents for evaluation of right foot ischemia. The patient has complained of right foot heaviness and a "dead sensation" and purplish color changes since having a fall 3 weeks ago. She also reports decreased range of motion. She denies prior history of claudication. However she does not walk much. She has fairly severe heart failure with her last ejection fraction being 15-20%. She is followed by Dr. not certain cardiology. There considering transferring her care to Dr. Bensimhon in the heart failure clinic. Other medical problems include coronary artery disease, diabetes, hypertension, asthma, bronchitis, renal dysfunction (creatinine 1.4 in May). The patient is on home oxygen at night.  Past Medical History  Diagnosis Date  . Stroke 05/2011  . Coronary artery disease     a. s/p CABG 2007 (L-RI, S-dRCA, S-LAD, S-OM);  b. myoview 6/13:  anteroapical MI, no ischemia, EF 31%  . Diabetes mellitus   . Hypertension   . 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 Asthma   . Bronchitis   . Reflux   . Hyperlipidemia   . Chronic systolic heart failure     a. echo 6/13: mild LVH, EF 15-20%, diff HK, Gr 1 DD, mild reduced RVSF, PASP 32  . GERD (gastroesophageal reflux disease)   . Arthritis   . CKD (chronic kidney disease)     Past Surgical History  Procedure Laterality Date  . Coronary artery bypass graft      LIMA to RI, SVG to distal RCA, SVG to LAD, SVG to OM. 2007  . Joint replacement      Right hip replacement   . Debridement skin / sq / muscle of trunk      s/p staph infection from CABG 2007  . Cystectomy      Subcutaneous  . Partial hip arthroplasty    . Stroke       Social History History  Substance Use Topics  . Smoking status: Current Every Day Smoker -- 0.50 packs/day for 50 years    Types: Cigarettes  . Smokeless tobacco: Never Used  .  Alcohol Use: No    Family History Family History  Problem Relation Age of Onset  . Coronary artery disease Father 52    Unclear if it was an MI  . Stroke Mother 80  . Coronary artery disease Brother 68  . Coronary artery disease Brother 68    Allergies  Allergies  Allergen Reactions  . Codeine     unknown  . Ivp Dye [Iodinated Diagnostic Agents]     unknown  . Penicillins     unknown  . Sulfa Antibiotics     unknown     Current Outpatient Prescriptions  Medication Sig Dispense Refill  . albuterol (PROVENTIL HFA;VENTOLIN HFA) 108 (90 BASE) MCG/ACT inhaler Inhale 2 puffs into the lungs every 6 (six) hours as needed. For shortness of breath/wheezing      . ALPRAZolam (XANAX) 0.5 MG tablet Take 0.5 mg by mouth daily as needed. For anxiety      . budesonide-formoterol (SYMBICORT) 160-4.5 MCG/ACT inhaler Inhale 2 puffs into the lungs 2 (two) times daily.       . carvedilol (COREG) 12.5 MG tablet Take 0.5 tablets (6.25 mg total) by mouth every 12 (twelve) hours.  60 tablet    . clopidogrel (PLAVIX) 75 MG tablet Take 75 mg by mouth   daily.      . diphenhydrAMINE (BENADRYL) 25 mg capsule Take 25 mg by mouth every 6 (six) hours as needed. For allergies      . DULoxetine (CYMBALTA) 60 MG capsule Take 60 mg by mouth daily.      . HYDROcodone-acetaminophen (NORCO) 5-325 MG per tablet Take 1 tablet by mouth every 8 (eight) hours as needed for pain.  30 tablet  0  . insulin glargine (LANTUS) 100 UNIT/ML injection Inject 28 Units into the skin at bedtime.      . insulin lispro (HUMALOG) 100 UNIT/ML injection Inject 6-11 Units into the skin 3 (three) times daily before meals. Per sliding scale      . losartan (COZAAR) 50 MG tablet Take 1 tablet (50 mg total) by mouth daily.  30 tablet  5  . nitroGLYCERIN (NITROSTAT) 0.4 MG SL tablet Place 0.4 mg under the tongue every 5 (five) minutes as needed. For chest pain.      . pantoprazole (PROTONIX) 40 MG tablet Take 40 mg by mouth daily.      .  pregabalin (LYRICA) 50 MG capsule Take 50 mg by mouth 2 (two) times daily.      . rosuvastatin (CRESTOR) 40 MG tablet Take 40 mg by mouth daily.      . spironolactone (ALDACTONE) 25 MG tablet Take 1 tablet (25 mg total) by mouth daily.  30 tablet  5  . torsemide (DEMADEX) 20 MG tablet Take 2 tablets (40 mg total) by mouth 2 (two) times daily.  120 tablet  5  . [DISCONTINUED] potassium chloride (K-DUR,KLOR-CON) 10 MEQ tablet Take 10 mEq by mouth daily.        No current facility-administered medications for this visit.    ROS:   General:  No weight loss, Fever, chills  HEENT: No recent headaches, no nasal bleeding, no visual changes, no sore throat  Neurologic: No dizziness, blackouts, seizures. No recent symptoms of stroke or mini- stroke. No recent episodes of slurred speech, or temporary blindness.  Cardiac: No recent episodes of chest pain/pressure, no shortness of breath at rest.  No shortness of breath with exertion.  Denies history of atrial fibrillation or irregular heartbeat  Vascular: No history of rest pain in feet.  No history of claudication.  No history of non-healing ulcer, No history of DVT   Pulmonary: No home oxygen, no productive cough, no hemoptysis,  No asthma or wheezing  Musculoskeletal:  [ ] Arthritis, [ ] Low back pain,  [ ] Joint pain  Hematologic:No history of hypercoagulable state.  No history of easy bleeding.  No history of anemia  Gastrointestinal: No hematochezia or melena,  No gastroesophageal reflux, no trouble swallowing  Urinary: [ ] chronic Kidney disease, [ ] on HD - [ ] MWF or [ ] TTHS, [ ] Burning with urination, [ ] Frequent urination, [ ] Difficulty urinating;   Skin: No rashes  Psychological: No history of anxiety,  No history of depression   Physical Examination  Filed Vitals:   12/25/12 1200  BP: 119/89  Pulse: 73  Height: 5' (1.524 m)  Weight: 159 lb (72.122 kg)  SpO2: 100%    Body mass index is 31.05 kg/(m^2).  General:   Alert and oriented, no acute distress HEENT: Normal Neck: No bruit or JVD Pulmonary: Clear to auscultation bilaterally Cardiac: Regular Rate and Rhythm without murmur Abdomen: Soft, non-tender, non-distended, no mass Skin: No rash, bluish congested appearance to both feet suggestive of congestive failure. However she   also has dependent rubor of the right foot no ulcerations Extremity Pulses:  2+ radial, brachial, absent femoral, dorsalis pedis, posterior tibial pulses bilaterally Musculoskeletal: No deformity or edema  Neurologic: Upper and lower extremity motor 5/5 and symmetric  DATA: Duplex ultrasound dated August 21 is reviewed today. I reviewed and interpreted this study. She also had bilateral ABIs. 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 Her ABIs in the appearance of her foot is consistent with possible limb threatening ischemia.   PLAN:  I offered the patient aortogram and lower extremity runoff with possible intervention tomorrow. However she wishes to defer this to August 29. Risks benefits possible complications and procedure details including but not limited to vessel injury bleeding infection renal dysfunction were explained the patient today. She understands and agrees to proceed.  If the lesion is not amenable to percutaneous intervention she would need review by cardiology prior to considering bypass.   Tobacco cessation was emphasized and greater than 3 minutes they were spent regarding smoking cessation counseling.  Charles Fields, MD [facility] [place] Office: [phone] Pager: [phone]   

## 2013-01-02 NOTE — Interval H&P Note (Signed)
History and Physical Interval Note:  01/02/2013 6:41 AM  [name]  has presented today for surgery, with the diagnosis of PVD  The various methods of treatment have been discussed with the patient and family. After consideration of risks, benefits and other options for treatment, the patient has consented to  Procedure(s): ABDOMINAL AORTAGRAM (N/A) as a surgical intervention .  The patient's history has been reviewed, patient examined, no change in status, stable for surgery.  I have reviewed the patient's chart and labs.  Questions were answered to the patient's satisfaction.  Pt noted to have creatinine of 2 today.  She has significant ischemia right foot.  Her cardiac status is tenuous so it is not safe to aggressively hydrate her. Risk of contrast nephropathy explained.  Will do some of study with CO2.     FIELDS,CHARLES E

## 2013-01-02 NOTE — Progress Notes (Signed)
Dr. Darrick Penna notified of Creat. Via J. Scharlene Gloss, RN

## 2013-01-02 NOTE — Progress Notes (Signed)
RAPID RESPONSE NURSE IN TO SEE CLIENT AND CLIENT'S DAUGHTER IN TO SEE CLIENT AND KEEP HER AWAKE AND CLIENT EATING AND WATCHING TV

## 2013-01-02 NOTE — Progress Notes (Signed)
Pt lethargic on arrival to short stay.  She received no narcotic in holding or short stay.  She is more alert on my exam now.  Blood gas shows PCO2 60 and PO2 72 with respiratory acidosis.  Will d/c O2 and follow sat.  No O2 unless becomes symptomatic.  Hopefully will continue to improve.  Plan for d/c later today.  Fabienne Bruns, MD [facility] [place] Office: [phone] Pager: [phone]

## 2013-01-02 NOTE — Consult Note (Signed)
CARDIOLOGY CONSULT NOTE  Patient ID: [name], MRN: [id number], DOB/AGE: [birth year] 67 y.o. Admit date: 01/02/2013   Date of Consult: 01/02/2013 Primary Physician: Heidi Josephs, MD Primary Cardiologist: [name]  Chief Complaint: leg pain Reason for Consult: pre-op clearance for possible right femoral to below-knee popliteal bypass  HPI: [name] is a 67 y/o F with history of CAD s/p CABG 2007 (scar but nonischemic MV 10/2011), chronic systolic CHF/ICM (  LVEF by echo in June 2013 was reported 15 to 20%  I have reviewed  It appears to be more 35%)  LVEF at time of CABG in 2007 reported at 45-50%, DM, HTN, CVA, tobacco abuse, and renal insufficiency who presented to [facility] for planned PV angiogram.  We are asked to see for pre-op clearance of possible right femoral to below-knee popliteal bypass. This is planned next week for limb salvage. Other options considered are conservative management with narcotic medication if cardiac risk is felt to be signifiant, vs primary amputation for relief of pain symptoms.  She recently presented to her PCP with complaints of claudication. She reproted R foot heaviness, "dead sensation" and purplish color changes for several weeks with decreased ROM. PV angio today demonstrated "occlusion right popliteal artery with reconstitution of the distal below-knee popliteal artery and two-vessel runoff via the anterior and posterior tibial arteries with diffusely small tibial and popliteal vessels." The left LE was not evaluated due to her renal insufficiency. Pre-angio Cr was 2.0, baseline recently running in 1.2-1.4 range.  She is somewhat sleepy post-procedure but is able to be aroused and answers questions appropriately. (She received IV benadryl prior to angio due to dye allergy as well as CO2 contrast as part of procedure. [facility] plans ABG.) Sats were in the 80's on RA initially, coming up to 100% via [place]. She has home O2 following admission 09/2012  during which hypoxia was documented, but she only uses it at night and PRN.  When patinet was fully awake she says that although she doesn't walk she has been able to do her housework (mopping, vacuuming, dusting)  Walking is limited by back and by recent fall (lost balance)  She denies and change in her breathing, she denies signif CP over the past year.   She ambulates with a cane. Denies LEE, orthopnea, PND, weight change.  Past Medical History  Diagnosis Date  . Stroke 05/2011    a. R ischemic CVA 05/2011.  Marland Kitchen Coronary artery disease     a. s/p CABG 2007 (L-RI, S-dRCA, S-LAD, S-OM);  b. myoview 6/13:  anteroapical MI, no ischemia, EF 31%  . Diabetes mellitus   . Hypertension   . 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 Asthma   . Bronchitis   . Reflux   . Hyperlipidemia   . Chronic systolic heart failure     a. echo 6/13: mild LVH, EF 15-20%, diff HK, Gr 1 DD, mild reduced RVSF, PASP 32.  Marland Kitchen GERD (gastroesophageal reflux disease)   . Arthritis   . CKD (chronic kidney disease)   . Chronic back pain   . Hypoxia     a. Adm 09/2012 - discharged with home O2 in setting of CHF.  Marland Kitchen Abnormal stress test     a. Nuc 10/2011: bright target which may be in left breast or L chest wall or axilla. Pt reports she f/u with PCP and mammogram was OK (left breast cyst).      Most Recent Cardiac  Studies: 2D Echo 10/2011 - Left ventricle: The cavity size was normal. Wall thickness was increased in a pattern of mild LVH. The estimated ejection fraction was in the range of 15% to 20%. Severe diffuse hypokinesis. Doppler parameters are consistent with abnormal left ventricular relaxation (grade 1 diastolic dysfunction). Doppler parameters are consistent with elevated mean left atrial filling pressure. - Aortic valve: A bicuspid morphology cannot be excluded; mildly thickened, moderately calcified leaflets. - Right ventricle: Systolic function was mildly reduced. - Atrial septum: No defect or patent  foramen ovale was identified. - Pulmonary arteries: PA peak pressure: 32mm Hg (S).  Prior EF 2007: 45-50% by cath  Nuclear Stress Test 10/2011 The EF is 31% with global hypokinesis. Impression: Abnormal Lexiscan Myoview with old antero-apical MI and with moderate systolic dysfunction. Also noted on raw images is a bright target which may be in left breast or left chest wall or axilla. Clinical correlation advised. There is a perfusion defect of moderate size and moderate severity affecting the anteroapical and mid-anterior myocardium on both the rest and stress images consistent with myocardial scar. There does not appear to be significant reversibility.  Last cath appears before CABG 2007 CARDIAC CATHETERIZATION  INDICATIONS: [name] is a 67 year old female with a history of  hypertension, hypercholesterolemia, diabetes mellitus and coronary artery  disease. She is status post PTCA of her LAD many years ago. She now presents  with classic symptoms of angina. We scheduled her for a heart  catheterization based on these symptoms.  PROCEDURE: Left heart catheterization with coronary angiography.  The right femoral artery was easily cannulated using a modified Seldinger  technique.  HEMODYNAMICS: LV pressure is 146/60. Aortic pressure of 145/81.  ANGIOGRAPHY:  LEFT MAIN: Left main is normal.  Left anterior descending artery is normal in the proximal segment. The mid  is diffusely diseased to 50%. There is a 99% stenosis in the mid/distal LAD.  The distal LAD is small but enlarges after sublingual nitroglycerin. There  are only very tiny diagonal arteries that are not revascularization  candidates.  Left circumflex artery has a proximal 70% to 80%. There is a large high  obtuse marginal artery/ramus intermediate artery. There is a 70% to 80%  stenosis in that vessel.  The second obtuse marginal artery has an 80% stenosis and the posterolateral  branch has a 60% stenosis in  each of the small branches.  Right coronary artery is large and dominant. There is a proximal 70%  stenosis. This stenosis is quite long. The mid and distal vessel are fairly  normal, but there is an 80% stenosis in the posterolateral branch.  Left ventriculogram was performed in the 30 RAO position. It reveals mild  anterior wall hypokinesis. Ejection fraction is 45% to 50%.  COMPLICATIONS: None.  CONCLUSION:  1. Severe three-vessel coronary artery disease.  2. Mildly depressed left ventricular systolic function. We will consult  CVTS for consideration for bypass surgery.    Surgical History:  Past Surgical History  Procedure Laterality Date  . Coronary artery bypass graft      LIMA to RI, SVG to distal RCA, SVG to LAD, SVG to OM. 2007  . Joint replacement      Right hip replacement   . Debridement skin / sq / muscle of trunk      s/p staph infection from CABG 2007  . Cystectomy      Subcutaneous  . Partial hip arthroplasty    . Stroke  Home Meds: Prior to Admission medications   Medication Sig Start Date End Date Taking? Authorizing Provider  albuterol (PROVENTIL HFA;VENTOLIN HFA) 108 (90 BASE) MCG/ACT inhaler Inhale 2 puffs into the lungs every 6 (six) hours as needed. For shortness of breath/wheezing   Yes Historical Provider, MD  ALPRAZolam Prudy Feeler) 0.5 MG tablet Take 0.5 mg by mouth daily as needed. For anxiety   Yes Historical Provider, MD  budesonide-formoterol (SYMBICORT) 160-4.5 MCG/ACT inhaler Inhale 2 puffs into the lungs 2 (two) times daily.    Yes Historical Provider, MD  carvedilol (COREG) 12.5 MG tablet Take 12.5 mg by mouth every 12 (twelve) hours. 04/17/12 04/17/13 Yes Rhetta Mura, MD  clopidogrel (PLAVIX) 75 MG tablet Take 75 mg by mouth daily.   Yes Historical Provider, MD  diphenhydrAMINE (BENADRYL) 25 mg capsule Take 25 mg by mouth every 6 (six) hours as needed. For allergies   Yes Historical Provider, MD  DULoxetine (CYMBALTA) 60 MG capsule Take  60 mg by mouth daily.   Yes Historical Provider, MD  HYDROcodone-acetaminophen (NORCO) 5-325 MG per tablet Take 1 tablet by mouth every 8 (eight) hours as needed for pain. 12/19/12  Yes Thao P Le, DO  insulin glargine (LANTUS) 100 UNIT/ML injection Inject 28 Units into the skin every morning.    Yes Historical Provider, MD  insulin lispro (HUMALOG) 100 UNIT/ML injection Inject 6-11 Units into the skin 3 (three) times daily before meals. Per sliding scale   Yes Historical Provider, MD  losartan (COZAAR) 50 MG tablet Take 1 tablet (50 mg total) by mouth daily. 09/29/12  Yes Scott T Alben Spittle, PA-C  pantoprazole (PROTONIX) 40 MG tablet Take 40 mg by mouth daily.   Yes Historical Provider, MD  potassium chloride (K-DUR) 10 MEQ tablet Take 10 mEq by mouth daily.   Yes Historical Provider, MD  pregabalin (LYRICA) 50 MG capsule Take 50 mg by mouth 2 (two) times daily.   Yes Historical Provider, MD  rosuvastatin (CRESTOR) 40 MG tablet Take 40 mg by mouth daily.   Yes Historical Provider, MD  spironolactone (ALDACTONE) 25 MG tablet Take 25 mg by mouth daily. 09/29/12  Yes Scott T Alben Spittle, PA-C  torsemide (DEMADEX) 20 MG tablet Take 2 tablets (40 mg total) by mouth 2 (two) times daily. 09/29/12  Yes Scott Moishe Spice, PA-C  nitroGLYCERIN (NITROSTAT) 0.4 MG SL tablet Place 0.4 mg under the tongue every 5 (five) minutes as needed. For chest pain.    Historical Provider, MD    Inpatient Medications:    . sodium chloride 250 mL (01/02/13 0826)  . sodium chloride 100 mL/hr at 01/02/13 1324    Allergies:  Allergies  Allergen Reactions  . Sulfa Antibiotics Anaphylaxis and Shortness Of Breath  . Codeine Nausea Only and Other (See Comments)    Severe headache  . Ivp Dye [Iodinated Diagnostic Agents] Hives and Other (See Comments)    Severe anxiety  . Penicillins Swelling and Other (See Comments)    "huge sores"    History   Social History  . Marital Status: Divorced    Spouse Name: N/A    Number of Children:  4  . Years of Education: N/A   Occupational History  . Retired  [employer]    [employer]   Social History Main Topics  . Smoking status: Current Every Day Smoker -- 0.50 packs/day for 50 years    Types: Cigarettes  . Smokeless tobacco: Never Used  . Alcohol Use: No  . Drug Use: No  .  Sexual Activity: Not on file   Other Topics Concern  . Not on file   Social History Narrative   Lives with grandson 71 years old.     Family History  Problem Relation Age of Onset  . Coronary artery disease Father 20    Unclear if it was an MI  . Stroke Mother 95  . Coronary artery disease Brother 71  . Coronary artery disease Brother 44     Review of Systems: General: negative for chills, fever,or weight changes.  Cardiovascular: negative orthopnea, PND, Dermatological: negative for rash Respiratory: chronic chest congestion Urologic: negative for hematuria Abdominal: negative for nausea, vomiting, diarrhea, bright red blood per rectum, melena, or hematemesis Neurologic: negative for visual changes, syncope, or dizziness. Mild residual hand weakness from stroke All other systems reviewed and are otherwise negative except as noted above.  Labs:  Lab Results  Component Value Date   WBC 9.2 12/19/2012   HGB 15.3* 01/02/2013   HCT 45.0 01/02/2013   MCV 92.0 12/19/2012   PLT 163 09/25/2012     Recent Labs Lab 01/02/13 0615  NA 140  K 4.5  CL 99  BUN 35*  CREATININE 2.00*  GLUCOSE 247*   Radiology/Studies:  PV angio as above  EKG: NSR 77 1st degree AVB occ PVCs, LAE, TWI avL, V5-V6, biphasic T'w III, avF. Septal infarct, age undetermined Does not appear significantly different from prior  Physical Exam: Blood pressure 130/74, pulse 78, temperature 97.8 F (36.6 C), temperature source Oral, resp. rate 20, height 5' (1.524 m), weight 156 lb (70.761 kg), SpO2 90.00%. General: Well developed, well nourished WF in no acute distress. Head: Normocephalic, atraumatic, sclera  non-icteric, no xanthomas, nares are without discharge.  Neck: Negative for carotid bruits. JVD mildly elevated. Lungs: Diffuse rhonchi which are cleared by coughing. Diminished BS and coarse otherwise. No rales or wheezes. Breathing is unlabored. Heart: RRR with S1 S2. No murmurs, rubs, or gallops appreciated. Abdomen: Soft, non-tender, non-distended with normoactive bowel sounds. No hepatomegaly. No rebound/guarding. No obvious abdominal masses. Msk:  Strength and tone appear normal for age. Extremities: Bilateral leg erythema and dryness, scaling, escoriated. Neuro: Alert and oriented X 3 but falls asleep quickly. No facial asymmetry.  Psych:  Responds to questions appropriately with a normal affect.   Assessment and Plan:   1. Severe PAD 2. CAD s/p CABG 2007, MV 10/2011 with scar but no area of reversibility 3.Systolic CHF.  I have reviewed echo images from 2013.  LVEF appears more in the35% range. 4. Acute on chronic renal insufficiency 5. Arthritis/chronic back pain 6. Diabetes mellitus 7. H/o CVA  8. Longstanding tobacco abuse with h/o hypoxia  Dr. Tenny Craw plans to look at pt's prior cath films to make a more informed decision about next course of action.  Signed, Ronie Spies PA-C 01/02/2013, 9:41 AM  Patient seen and examined  I have amended above note to reflect my findings.   From a cardiac standpoint I feel she is at mild to moderate risk for cardiac complication with surgery.  She does not have symptoms to suggest cardiac ischemia.  Note myoview in 2013 showed no signif ischemia and her symptoms have not changed since that time LV function is moderately down by my review of last echo  On exam she has some mild increased volume but is in NAD I think she can proceed with surgery with close cardiac (as well as pulmonary and renal) follow up in there perioperative period.   I would  not recomm further cardiac testing for now.   I have discussed this with the patient  She  understands.  2. Chronic CHF  As noted above  3  Renal insufficieny  This will need to be followed closely  Just received 40 cc contrast   Dietrich Pates 01/02/2013

## 2013-01-02 NOTE — Progress Notes (Signed)
Called at 1100 to assist with patient with decreased LOC post procedure.  Patient did not receive narcotics or sedation during procedure - had Benadryl pre-procedure.  Staff reports her to be very sleepy and desats when sleeping.  ABG revealed elevated pCO2 with some resp acidosis.  On arrival patient arouses to loud voice and shaking. Skin w/d.  Vital signs stable - see flowsheet.  When aroused she is oriented and appropriate. Clearly on observation has shallow resps without stimulation.  O2 nasal cannula has been removed per Dr. Darrick Penna order.  Bil BS with diffuse rhonchi - clears when she coughs.  No wheezing noted.  No resp distress.  Patient denies pain.  She stays awake with stimulus in room.  Daughter here now - patient staying more alert.  Options are to try to stimulate her to have her reduce her own CO2 or try Bipap.  After 15 minutes patient staying alert - will try to avoid Bipap.  Daughter to stay with patient for few minutes.  Bedrest post angio is til 2pm.  Will follow as needed.  Dr. Darrick Penna aware per [facility] RN.

## 2013-01-02 NOTE — Progress Notes (Signed)
Continues to wake up more.  Understands procedure.  Awaiting Cardiology input.  Home today.  Fabienne Bruns, MD [facility] [place] Office: [phone] Pager: [phone]

## 2013-01-02 NOTE — Progress Notes (Signed)
DR FIELDS NOTIFIED OF O2 SATS AND CLIENT VERY SLEEPY AND ORDER NOTED

## 2013-01-02 NOTE — Op Note (Signed)
Procedure: Aortogram with right lower extremity runoff  Preoperative diagnosis: Ischemia right foot with rest pain  Postoperative diagnosis: Same  Anesthesia: Local  Operative details: After obtaining informed consent, the patient was taken to the TV lab. The patient was placed in supine position on the Angio table. Both groins were prepped and draped in usual sterile fashion. Local anesthesia was infiltrated over the left common femoral artery. Ultrasound was used to identify the left common femoral artery. A small nick was made in the left inguinal crease and a hemostat was used to dilate the tract. An introducer needle was then used to cannulate the left common femoral artery. An 035 first core wire was threaded into the abdominal aorta under fluoroscopic guidance. A 5 French sheath was placed over the guidewire and the left common femoral artery. This was thoroughly flushed with heparinized saline. A 5 French pigtail catheter was placed over the guidewire up into the abdominal aorta. Carbon dioxide contrast was initially used to the patient's renal insufficiency. The infrarenal abdominal aorta is patent. The left and right common internal and external iliac arteries are patent. The pigtail catheter was pulled out just above the aortic bifurcation and LAO and RAO oblique views of the pelvis were performed to confirm the above findings. Next the pigtail catheter was removed over a guidewire exchange for a 5 [place] crossover catheter. This was used to slightly catheterize the right common iliac artery. The guidewire was then advanced down into the distal right external iliac artery and into the right superficial femoral artery. The crossover catheter was then exchanged for a 5 French straight catheter. A straight catheter was then placed in the proximal superficial femoral artery. Carbon dioxide contrast was then used to evaluate the proximal superficial femoral artery. This was patent. At this point the  opacification of the contrast was becoming poor. Therefore we switched contrast at this point. The distal right superficial femoral artery is patent. The popliteal artery is occluded. There is reconstitution of a very small below-knee popliteal artery with two-vessel runoff via the anterior and posterior tibial arteries. The popliteal and tibial arteries are very small the popliteal artery is 2 mm or less in diameter. Next the 5 French straight catheter was removed over a guidewire and a 5 [place] sheath thoroughly flushed with heparin saline. The left lower extremity was not evaluated due to the patient's elevated creatinine in an attempt to limit contrast exposure. The patient tolerated the procedure well and there were no complications. The patient was taken to the holding area with a 5 French sheath in place to be pulled in the holding area.  Operative findings: #1 patent aortoiliac segment #2 patent common femoral and superficial femoral artery #3 occlusion right popliteal artery with reconstitution of the distal below-knee popliteal artery and two-vessel runoff via the anterior and posterior tibial arteries with diffusely small tibial and popliteal vessels  Operative management: The patient will be evaluated for cardiac risk stratification by [name] or one of his partners. If she is thought to be of reasonable risk we will consider her for a right femoral to below-knee popliteal bypass next week for limb salvage. If her cardiac risk is thought to be significant, we will manage her conservatively with narcotic pain medication for rest pain. However, consideration would also need to be given for primary amputation for relief of pain symptoms.  Fabienne Bruns, MD [facility] [place] Office: [phone] Pager: [phone]

## 2013-01-02 NOTE — Progress Notes (Signed)
Called to see pt for reduced SAT's post CO2 angio.  Pt VSS  Pt very somnolent but arousable. No C/o SOB or wheezing' I feel like I might be getting my asthma."  Pt uses O2 at night at home Has inhaler at home  O2 sats 96% on 3L   Lungs clear without wheezing, breathing comfortably Heart RRR  Pt received Benadryl pre -procedure - somnolent. Not in resp distress Will order albuteral as needed Cont O2 until pt more awake.

## 2013-01-06 ENCOUNTER — Telehealth: Payer: Self-pay

## 2013-01-06 DIAGNOSIS — I70229 Atherosclerosis of native arteries of extremities with rest pain, unspecified extremity: Secondary | ICD-10-CM

## 2013-01-06 LAB — GLUCOSE, CAPILLARY: Glucose-Capillary: 280 mg/dL — ABNORMAL HIGH (ref 70–99)

## 2013-01-06 MED ORDER — HYDROCODONE-ACETAMINOPHEN 5-325 MG PO TABS: ORAL_TABLET | ORAL | Status: DC

## 2013-01-06 NOTE — Telephone Encounter (Signed)
Pt. called to request pain medication stronger than ES Tylenol.  Recent Aortogram on 8/29.  States told per Dr. Darrick Penna to call office if the ES Tylenol isn't helping with pain right lower extremity.  Returned call to pt.  Pt. asleep at time of return call; spoke with granddaughter, Aundra Millet.  Stated her grandmother was not getting any relief with the ES Tylenol.  Advised will phone in Norco 5/325 mg for pain.  Discussed with granddaughter scheduling pt. for bypass surgery on right leg.   Scheduled pt. for Right Femoral - Below Knee Popliteal BP on 01/14/13 @ 8:30 AM.  Granddaughter will inform pt. of date/time of surgery.  Advised will contact pt. tomorrow to explain further about scheduling surgery.  Verb. Understanding.

## 2013-01-07 ENCOUNTER — Encounter (HOSPITAL_COMMUNITY): Payer: Self-pay | Admitting: Pharmacy Technician

## 2013-01-07 ENCOUNTER — Other Ambulatory Visit: Payer: Self-pay

## 2013-01-13 ENCOUNTER — Encounter (HOSPITAL_COMMUNITY)
Admission: RE | Admit: 2013-01-13 | Discharge: 2013-01-13 | Disposition: A | Discharge status: Home / Self Care | Payer: Medicare Other | Source: Ambulatory Visit | Attending: Anesthesiology | Admitting: Anesthesiology

## 2013-01-13 ENCOUNTER — Encounter (HOSPITAL_COMMUNITY): Payer: Self-pay

## 2013-01-13 ENCOUNTER — Encounter (HOSPITAL_COMMUNITY)
Admission: RE | Admit: 2013-01-13 | Discharge: 2013-01-13 | Disposition: A | Discharge status: Home / Self Care | Payer: Medicare Other | Source: Ambulatory Visit | Attending: Vascular Surgery | Admitting: Vascular Surgery

## 2013-01-13 ENCOUNTER — Telehealth: Payer: Self-pay

## 2013-01-13 DIAGNOSIS — I739 Peripheral vascular disease, unspecified: Secondary | ICD-10-CM

## 2013-01-13 DIAGNOSIS — Z01818 Encounter for other preprocedural examination: Secondary | ICD-10-CM | POA: Insufficient documentation

## 2013-01-13 DIAGNOSIS — Z01812 Encounter for preprocedural laboratory examination: Secondary | ICD-10-CM | POA: Insufficient documentation

## 2013-01-13 HISTORY — DX: Peripheral vascular disease, unspecified: I73.9

## 2013-01-13 HISTORY — DX: Acute poliomyelitis, unspecified: A80.9

## 2013-01-13 HISTORY — DX: Angina pectoris, unspecified: I20.9

## 2013-01-13 HISTORY — DX: Other complications of anesthesia, initial encounter: T88.59XA

## 2013-01-13 HISTORY — DX: Shortness of breath: R06.02

## 2013-01-13 HISTORY — DX: Major depressive disorder, single episode, unspecified: F32.9

## 2013-01-13 HISTORY — DX: Adverse effect of unspecified anesthetic, initial encounter: T41.45XA

## 2013-01-13 HISTORY — DX: Depression, unspecified: F32.A

## 2013-01-13 HISTORY — DX: Neuralgia and neuritis, unspecified: M79.2

## 2013-01-13 LAB — COMPREHENSIVE METABOLIC PANEL
AST: 20 U/L (ref 0–37)
Albumin: 3.3 g/dL — ABNORMAL LOW (ref 3.5–5.2)
CO2: 32 mEq/L (ref 19–32)
Calcium: 9.4 mg/dL (ref 8.4–10.5)
Creatinine, Ser: 1.54 mg/dL — ABNORMAL HIGH (ref 0.50–1.10)
GFR calc non Af Amer: 34 mL/min — ABNORMAL LOW (ref >90)
Total Protein: 7.7 g/dL (ref 6.0–8.3)

## 2013-01-13 LAB — TYPE AND SCREEN: ABO/RH(D): A POS

## 2013-01-13 LAB — URINALYSIS, ROUTINE W REFLEX MICROSCOPIC
Glucose, UA: 100 mg/dL — AB
Ketones, ur: NEGATIVE mg/dL
Leukocytes, UA: NEGATIVE
Protein, ur: 30 mg/dL — AB

## 2013-01-13 LAB — PROTIME-INR: INR: 1.09 (ref 0.00–1.49)

## 2013-01-13 LAB — SURGICAL PCR SCREEN: Staphylococcus aureus: NEGATIVE

## 2013-01-13 LAB — CBC
MCH: 30.9 pg (ref 26.0–34.0)
MCHC: 34.2 g/dL (ref 30.0–36.0)
MCV: 90.3 fL (ref 78.0–100.0)
Platelets: 163 10*3/uL (ref 150–400)
RDW: 15.9 % — ABNORMAL HIGH (ref 11.5–15.5)

## 2013-01-13 MED ORDER — HYDROCODONE-ACETAMINOPHEN 5-325 MG PO TABS: ORAL_TABLET | ORAL | Status: DC

## 2013-01-13 NOTE — Progress Notes (Signed)
Pt is not stopping Plavix per MD instruction.

## 2013-01-13 NOTE — Telephone Encounter (Signed)
Phone call to pt. regarding need to change her surgery date from 01/14/13 to 01/20/13, due to change in Dr. Darrick Penna schedule.  Pt. checked with her family, and agrees to change date of Right Fem-Pop BP to Tuesday, 01/20/13. Pt. Will be going to her Pre-op appt. today.   Pt. requested refill of pain medication due to continuous right foot pain.  Will order Hydrocodone/ Acetaminophen 5/325 mg,  per standing order, @ pt's pharmacy.  Pt. agrees with plan.

## 2013-01-13 NOTE — Pre-Procedure Instructions (Addendum)
MAIZIE GARNO  01/13/2013   Your procedure is scheduled on:  Tuesday, September 16th.  Report to [facility] at 5:30AM.  Call this number if you have problems the morning of surgery: [phone]   Remember:   Do not eat food or drink liquids after midnight.   Take these medicines the morning of surgery with A SIP OF WATER: carvedilol (COREG),DULoxetine (CYMBALTA),pantoprazole (PROTONIX),pregabalin (LYRICA).   May use inhalers and bring Albuterol inhaler to the hospital with you.  Take if needed: ALPRAZolam (XANAX),diphenhydrAMINE (BENADRYL),HYDROcodone-acetaminophen (NORCO/VICODIN), NTG.       Do not wear jewelry, make-up or nail polish.  Do not wear lotions, powders, or perfumes. You may wear deodorant.  Do not shave 48 hours prior to surgery. Men may shave face and neck.  Do not bring valuables to the hospital.  [facility] is not responsible  for any belongings or valuables.  Contacts, dentures or bridgework may not be worn into surgery.  Leave suitcase in the car. After surgery it may be brought to your room.  For patients admitted to the hospital, checkout time is 11:00 AM the day of discharge.   Special Instructions: Shower with CHG wash (Bactoshield) two nights before surgery and the night before surgery.   Please read over the following fact sheets that you were given: Pain Booklet, Coughing and Deep Breathing, Blood Transfusion Information and Surgical Site Infection Prevention

## 2013-01-13 NOTE — Progress Notes (Signed)
Anesthesia Chart Review:  Patient is a 67 year old female scheduled for right FPBG on 01/20/13 by Dr. Darrick Penna.   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 PCP is Dr. Farris Has.  Cardiologist is [name].  Patient was evaluated by Dr. [name] partner Dr. Tenny Craw on 01/02/13 for preoperative clearance.  Her note states, "From a cardiac standpoint I feel she is at mild to moderate risk for cardiac complication with surgery. She does not have symptoms to suggest cardiac ischemia. Note myoview in 2013 showed no signif ischemia and her symptoms have not changed since that time. LV function is moderately down by my review of last echo On exam she has some mild increased volume but is in NAD I think she can proceed with surgery with close cardiac (as well as pulmonary and renal) follow up in there perioperative period. I would not recomm further cardiac testing for now."  Echo on 10/21/11 showed: - Left ventricle: The cavity size was normal. Wall thickness was increased in a pattern of mild LVH. The estimated ejection fraction was in the range of 15% to 20%. Severe diffuse hypokinesis. Doppler parameters are consistent with abnormal left ventricular relaxation (grade 1 diastolic dysfunction). Doppler parameters are consistent with elevated mean left atrial filling pressure. - Aortic valve: A bicuspid morphology cannot be excluded;mildly thickened, moderately calcified leaflets. - Right ventricle: Systolic function was mildly reduced. - Atrial septum: No defect or patent foramen ovale was identified. - Pulmonary arteries: PA peak pressure: 32mm Hg (S). Dr. Tenny Craw' noted from 01/02/13 indicates, "(LVEF by echo in June 2013 was reported 15 to 20% I have reviewed It appears to be more 35%)."  Nuclear stress est on 10/24/11 showed: Abnormal Lexiscan Myoview with  old antero-apical MI and with moderate systolic dysfunction. EF 31% with global hypokinesis. Also noted on raw images is a bright target which may be in left breast or left chest wall or axilla. Clinical correlation advised.   EKG on 01/02/13 showed SR with first degree AVB, occasional PVC's, LAE, septal infarct (age undetermined), T wave abnormality, consider lateral ischemia.  Carotid duplex on 05/20/11 showed: Right: mild mixed plaque origin ICA. Vertebral flow not insonated. Left: mild to moderate mixed plaque origin and proximal ICA and ECA.Vertebral flow is antegrade.Bilateral: no significant ICA stenosis.  CXR on 01/13/13 showed continued enlarged cardiac shadow, clear lungs, no acute abnormalities seen.  (Of note, she had respiratory acidosis following her arteriogram for which she became drowsy following Benadryl but symptoms improved as she became more alert, and she was ultimately discharged home that day.)   Preoperative labs noted.  Cr 1.54 (down from 2.00 on 01/02/13; with baseline appearing to be ~ 1.4).  Non-fasting glucose was elevated at 303.  A1C on 12/19/12 was 8.2--which would correlate more with average glucose levels < 200.  She will get a fasting CBG on arrival.  She has an extensive medical history but with with recent cardiology evaluation for clearance.  Her CXR and renal function appear stable.  She will be evaluated by her assigned anesthesiologist on the day of surgery. If no acute cardiopulmonary issues then I would anticipate that she could proceed as planned.  Could consider getting baseline preoperative ABG--defer decision to her anesthesiologist or surgeon.  Velna Ochs [facility]/Anesthesiology Phone [phone] 01/13/2013 4:38 PM

## 2013-01-19 MED ORDER — VANCOMYCIN HCL IN DEXTROSE 1-5 GM/200ML-% IV SOLN
1000.0000 mg | INTRAVENOUS | Status: AC
  Administered 2013-01-20: 1000 mg via INTRAVENOUS
  Filled 2013-01-19: qty 200

## 2013-01-20 ENCOUNTER — Encounter (HOSPITAL_COMMUNITY): Payer: Self-pay | Admitting: Vascular Surgery

## 2013-01-20 ENCOUNTER — Inpatient Hospital Stay (HOSPITAL_COMMUNITY): Payer: Medicare Other | Admitting: Anesthesiology

## 2013-01-20 ENCOUNTER — Inpatient Hospital Stay (HOSPITAL_COMMUNITY)
Admission: RE | Admit: 2013-01-20 | Discharge: 2013-01-26 | DRG: 253 | Disposition: A | Discharge status: Home Health | Payer: Medicare Other | Source: Ambulatory Visit | Attending: Vascular Surgery | Admitting: Vascular Surgery

## 2013-01-20 ENCOUNTER — Encounter (HOSPITAL_COMMUNITY)
Admission: RE | Disposition: A | Discharge status: Home Health | Payer: Self-pay | Source: Ambulatory Visit | Attending: Vascular Surgery

## 2013-01-20 ENCOUNTER — Encounter (HOSPITAL_COMMUNITY): Payer: Self-pay | Admitting: Anesthesiology

## 2013-01-20 DIAGNOSIS — I129 Hypertensive chronic kidney disease with stage 1 through stage 4 chronic kidney disease, or unspecified chronic kidney disease: Secondary | ICD-10-CM | POA: Diagnosis present

## 2013-01-20 DIAGNOSIS — I739 Peripheral vascular disease, unspecified: Principal | ICD-10-CM | POA: Diagnosis present

## 2013-01-20 DIAGNOSIS — M129 Arthropathy, unspecified: Secondary | ICD-10-CM | POA: Diagnosis present

## 2013-01-20 DIAGNOSIS — F3289 Other specified depressive episodes: Secondary | ICD-10-CM | POA: Diagnosis present

## 2013-01-20 DIAGNOSIS — I5022 Chronic systolic (congestive) heart failure: Secondary | ICD-10-CM | POA: Diagnosis present

## 2013-01-20 DIAGNOSIS — Z8673 Personal history of transient ischemic attack (TIA), and cerebral infarction without residual deficits: Secondary | ICD-10-CM

## 2013-01-20 DIAGNOSIS — L98499 Non-pressure chronic ulcer of skin of other sites with unspecified severity: Secondary | ICD-10-CM

## 2013-01-20 DIAGNOSIS — Z7902 Long term (current) use of antithrombotics/antiplatelets: Secondary | ICD-10-CM

## 2013-01-20 DIAGNOSIS — I70229 Atherosclerosis of native arteries of extremities with rest pain, unspecified extremity: Secondary | ICD-10-CM

## 2013-01-20 DIAGNOSIS — F329 Major depressive disorder, single episode, unspecified: Secondary | ICD-10-CM | POA: Diagnosis present

## 2013-01-20 DIAGNOSIS — I252 Old myocardial infarction: Secondary | ICD-10-CM

## 2013-01-20 DIAGNOSIS — I509 Heart failure, unspecified: Secondary | ICD-10-CM | POA: Diagnosis present

## 2013-01-20 DIAGNOSIS — Z23 Encounter for immunization: Secondary | ICD-10-CM

## 2013-01-20 DIAGNOSIS — K219 Gastro-esophageal reflux disease without esophagitis: Secondary | ICD-10-CM | POA: Diagnosis present

## 2013-01-20 DIAGNOSIS — Z951 Presence of aortocoronary bypass graft: Secondary | ICD-10-CM

## 2013-01-20 DIAGNOSIS — Z794 Long term (current) use of insulin: Secondary | ICD-10-CM

## 2013-01-20 DIAGNOSIS — E119 Type 2 diabetes mellitus without complications: Secondary | ICD-10-CM | POA: Diagnosis present

## 2013-01-20 DIAGNOSIS — N189 Chronic kidney disease, unspecified: Secondary | ICD-10-CM | POA: Diagnosis present

## 2013-01-20 DIAGNOSIS — R5381 Other malaise: Secondary | ICD-10-CM | POA: Diagnosis present

## 2013-01-20 DIAGNOSIS — Z79899 Other long term (current) drug therapy: Secondary | ICD-10-CM

## 2013-01-20 DIAGNOSIS — R339 Retention of urine, unspecified: Secondary | ICD-10-CM | POA: Diagnosis not present

## 2013-01-20 DIAGNOSIS — Z96649 Presence of unspecified artificial hip joint: Secondary | ICD-10-CM

## 2013-01-20 DIAGNOSIS — E785 Hyperlipidemia, unspecified: Secondary | ICD-10-CM | POA: Diagnosis present

## 2013-01-20 DIAGNOSIS — J45909 Unspecified asthma, uncomplicated: Secondary | ICD-10-CM | POA: Diagnosis present

## 2013-01-20 DIAGNOSIS — F17200 Nicotine dependence, unspecified, uncomplicated: Secondary | ICD-10-CM | POA: Diagnosis present

## 2013-01-20 DIAGNOSIS — L97509 Non-pressure chronic ulcer of other part of unspecified foot with unspecified severity: Secondary | ICD-10-CM | POA: Diagnosis present

## 2013-01-20 DIAGNOSIS — I2510 Atherosclerotic heart disease of native coronary artery without angina pectoris: Secondary | ICD-10-CM | POA: Diagnosis present

## 2013-01-20 DIAGNOSIS — Z8612 Personal history of poliomyelitis: Secondary | ICD-10-CM

## 2013-01-20 HISTORY — PX: PATCH ANGIOPLASTY: SHX6230

## 2013-01-20 HISTORY — PX: FEMORAL-POPLITEAL BYPASS GRAFT: SHX937

## 2013-01-20 LAB — CBC
HCT: 36.8 % (ref 36.0–46.0)
MCH: 29.6 pg (ref 26.0–34.0)
MCV: 90.9 fL (ref 78.0–100.0)
RBC: 4.05 MIL/uL (ref 3.87–5.11)
WBC: 8.1 10*3/uL (ref 4.0–10.5)

## 2013-01-20 LAB — CREATININE, SERUM: GFR calc Af Amer: 45 mL/min — ABNORMAL LOW (ref >90)

## 2013-01-20 LAB — GLUCOSE, CAPILLARY
Glucose-Capillary: 188 mg/dL — ABNORMAL HIGH (ref 70–99)
Glucose-Capillary: 116 mg/dL — ABNORMAL HIGH (ref 70–99)

## 2013-01-20 SURGERY — BYPASS GRAFT FEMORAL-POPLITEAL ARTERY
Anesthesia: General | Site: Leg Lower | Laterality: Right | Wound class: Clean

## 2013-01-20 MED ORDER — ALBUTEROL SULFATE HFA 108 (90 BASE) MCG/ACT IN AERS: 2.0000 | INHALATION_SPRAY | Freq: Four times a day (QID) | RESPIRATORY_TRACT | Status: DC | PRN

## 2013-01-20 MED ORDER — LACTATED RINGERS IV SOLN
INTRAVENOUS | Status: DC | PRN
  Administered 2013-01-20 (×3): via INTRAVENOUS

## 2013-01-20 MED ORDER — TORSEMIDE 20 MG PO TABS
40.0000 mg | ORAL_TABLET | Freq: Two times a day (BID) | ORAL | Status: DC
  Administered 2013-01-20 – 2013-01-26 (×12): 40 mg via ORAL
  Filled 2013-01-20 (×14): qty 2

## 2013-01-20 MED ORDER — HYDROMORPHONE HCL PF 1 MG/ML IJ SOLN
INTRAMUSCULAR | Status: AC
  Filled 2013-01-20: qty 1

## 2013-01-20 MED ORDER — ONDANSETRON HCL 4 MG/2ML IJ SOLN: 4.0000 mg | Freq: Once | INTRAMUSCULAR | Status: DC | PRN

## 2013-01-20 MED ORDER — HYDROCODONE-ACETAMINOPHEN 5-325 MG PO TABS
1.0000 | ORAL_TABLET | Freq: Four times a day (QID) | ORAL | Status: DC | PRN
  Administered 2013-01-21: 1 via ORAL
  Administered 2013-01-22 – 2013-01-24 (×2): 2 via ORAL
  Filled 2013-01-20: qty 1
  Filled 2013-01-20 (×2): qty 2
  Filled 2013-01-20 (×2): qty 1

## 2013-01-20 MED ORDER — POLYETHYLENE GLYCOL 3350 17 G PO PACK
17.0000 g | PACK | Freq: Every day | ORAL | Status: DC
  Administered 2013-01-21 – 2013-01-24 (×3): 17 g via ORAL
  Filled 2013-01-20 (×7): qty 1

## 2013-01-20 MED ORDER — THROMBIN 20000 UNITS EX SOLR
CUTANEOUS | Status: AC
  Filled 2013-01-20: qty 20000

## 2013-01-20 MED ORDER — POTASSIUM CHLORIDE IN NACL 20-0.9 MEQ/L-% IV SOLN
INTRAVENOUS | Status: DC
  Administered 2013-01-20: 16:00:00 via INTRAVENOUS
  Filled 2013-01-20 (×6): qty 1000

## 2013-01-20 MED ORDER — VANCOMYCIN HCL IN DEXTROSE 1-5 GM/200ML-% IV SOLN
1000.0000 mg | Freq: Two times a day (BID) | INTRAVENOUS | Status: AC
  Administered 2013-01-20: 1000 mg via INTRAVENOUS
  Filled 2013-01-20: qty 200

## 2013-01-20 MED ORDER — LOSARTAN POTASSIUM 50 MG PO TABS
50.0000 mg | ORAL_TABLET | Freq: Every day | ORAL | Status: DC
  Administered 2013-01-21 – 2013-01-26 (×6): 50 mg via ORAL
  Filled 2013-01-20 (×6): qty 1

## 2013-01-20 MED ORDER — ATORVASTATIN CALCIUM 80 MG PO TABS
80.0000 mg | ORAL_TABLET | Freq: Every day | ORAL | Status: DC
  Administered 2013-01-20 – 2013-01-25 (×6): 80 mg via ORAL
  Filled 2013-01-20 (×7): qty 1

## 2013-01-20 MED ORDER — ONDANSETRON HCL 4 MG/2ML IJ SOLN: 4.0000 mg | Freq: Four times a day (QID) | INTRAMUSCULAR | Status: DC | PRN

## 2013-01-20 MED ORDER — ONDANSETRON HCL 4 MG/2ML IJ SOLN
INTRAMUSCULAR | Status: DC | PRN
  Administered 2013-01-20: 4 mg via INTRAVENOUS

## 2013-01-20 MED ORDER — POTASSIUM CHLORIDE ER 10 MEQ PO TBCR
10.0000 meq | EXTENDED_RELEASE_TABLET | Freq: Every day | ORAL | Status: DC
  Administered 2013-01-21 – 2013-01-26 (×6): 10 meq via ORAL
  Filled 2013-01-20 (×6): qty 1

## 2013-01-20 MED ORDER — GLYCOPYRROLATE 0.2 MG/ML IJ SOLN
INTRAMUSCULAR | Status: DC | PRN
  Administered 2013-01-20: 0.4 mg via INTRAVENOUS

## 2013-01-20 MED ORDER — NITROGLYCERIN 0.4 MG SL SUBL: 0.4000 mg | SUBLINGUAL_TABLET | SUBLINGUAL | Status: DC | PRN

## 2013-01-20 MED ORDER — HYDROMORPHONE HCL PF 1 MG/ML IJ SOLN
0.5000 mg | INTRAMUSCULAR | Status: DC | PRN
  Administered 2013-01-20 – 2013-01-21 (×3): 1 mg via INTRAVENOUS
  Filled 2013-01-20 (×3): qty 1

## 2013-01-20 MED ORDER — ALPRAZOLAM 0.25 MG PO TABS
0.5000 mg | ORAL_TABLET | Freq: Every day | ORAL | Status: DC | PRN
  Administered 2013-01-21: 0.5 mg via ORAL
  Filled 2013-01-20: qty 2
  Filled 2013-01-20: qty 1

## 2013-01-20 MED ORDER — INSULIN GLARGINE 100 UNIT/ML ~~LOC~~ SOLN
28.0000 [IU] | Freq: Every morning | SUBCUTANEOUS | Status: DC
  Administered 2013-01-21 – 2013-01-26 (×6): 28 [IU] via SUBCUTANEOUS
  Filled 2013-01-20 (×6): qty 0.28

## 2013-01-20 MED ORDER — PHENOL 1.4 % MT LIQD
1.0000 | OROMUCOSAL | Status: DC | PRN
Start: 2013-01-20 — End: 2013-01-26

## 2013-01-20 MED ORDER — DOCUSATE SODIUM 100 MG PO CAPS
100.0000 mg | ORAL_CAPSULE | Freq: Every day | ORAL | Status: DC
  Administered 2013-01-21 – 2013-01-26 (×5): 100 mg via ORAL
  Filled 2013-01-20 (×6): qty 1

## 2013-01-20 MED ORDER — METOPROLOL TARTRATE 1 MG/ML IV SOLN: 2.0000 mg | INTRAVENOUS | Status: DC | PRN

## 2013-01-20 MED ORDER — PROPOFOL 10 MG/ML IV BOLUS
INTRAVENOUS | Status: DC | PRN
  Administered 2013-01-20: 140 mg via INTRAVENOUS

## 2013-01-20 MED ORDER — CLOPIDOGREL BISULFATE 75 MG PO TABS
75.0000 mg | ORAL_TABLET | Freq: Every day | ORAL | Status: DC
  Administered 2013-01-21 – 2013-01-26 (×6): 75 mg via ORAL
  Filled 2013-01-20 (×7): qty 1

## 2013-01-20 MED ORDER — HYDROMORPHONE HCL PF 1 MG/ML IJ SOLN
0.2500 mg | INTRAMUSCULAR | Status: DC | PRN
  Administered 2013-01-20: 0.25 mg via INTRAVENOUS

## 2013-01-20 MED ORDER — HYDRALAZINE HCL 20 MG/ML IJ SOLN: 10.0000 mg | INTRAMUSCULAR | Status: DC | PRN

## 2013-01-20 MED ORDER — EPHEDRINE SULFATE 50 MG/ML IJ SOLN
INTRAMUSCULAR | Status: DC | PRN
  Administered 2013-01-20 (×2): 10 mg via INTRAVENOUS
  Administered 2013-01-20 (×2): 5 mg via INTRAVENOUS
  Administered 2013-01-20: 10 mg via INTRAVENOUS
  Administered 2013-01-20 (×2): 5 mg via INTRAVENOUS

## 2013-01-20 MED ORDER — SODIUM CHLORIDE 0.9 % IV SOLN: INTRAVENOUS | Status: DC

## 2013-01-20 MED ORDER — PREGABALIN 50 MG PO CAPS
50.0000 mg | ORAL_CAPSULE | Freq: Two times a day (BID) | ORAL | Status: DC
  Administered 2013-01-20 – 2013-01-26 (×12): 50 mg via ORAL
  Filled 2013-01-20 (×12): qty 1

## 2013-01-20 MED ORDER — LABETALOL HCL 5 MG/ML IV SOLN
10.0000 mg | INTRAVENOUS | Status: DC | PRN
  Filled 2013-01-20: qty 4

## 2013-01-20 MED ORDER — FENTANYL CITRATE 0.05 MG/ML IJ SOLN
INTRAMUSCULAR | Status: DC | PRN
  Administered 2013-01-20 (×2): 50 ug via INTRAVENOUS
  Administered 2013-01-20: 100 ug via INTRAVENOUS

## 2013-01-20 MED ORDER — LIDOCAINE HCL (CARDIAC) 20 MG/ML IV SOLN
INTRAVENOUS | Status: DC | PRN
  Administered 2013-01-20: 60 mg via INTRAVENOUS

## 2013-01-20 MED ORDER — ROCURONIUM BROMIDE 100 MG/10ML IV SOLN
INTRAVENOUS | Status: DC | PRN
  Administered 2013-01-20: 50 mg via INTRAVENOUS

## 2013-01-20 MED ORDER — INFLUENZA VAC SPLIT QUAD 0.5 ML IM SUSP
0.5000 mL | INTRAMUSCULAR | Status: AC
  Administered 2013-01-21: 0.5 mL via INTRAMUSCULAR
  Filled 2013-01-20: qty 0.5

## 2013-01-20 MED ORDER — THROMBIN 20000 UNITS EX SOLR
CUTANEOUS | Status: DC | PRN
  Administered 2013-01-20: 09:00:00 via TOPICAL

## 2013-01-20 MED ORDER — CARVEDILOL 12.5 MG PO TABS
12.5000 mg | ORAL_TABLET | Freq: Two times a day (BID) | ORAL | Status: DC
  Administered 2013-01-20 – 2013-01-26 (×11): 12.5 mg via ORAL
  Filled 2013-01-20 (×14): qty 1

## 2013-01-20 MED ORDER — ENOXAPARIN SODIUM 30 MG/0.3ML ~~LOC~~ SOLN
30.0000 mg | Freq: Every day | SUBCUTANEOUS | Status: DC
  Administered 2013-01-20 – 2013-01-25 (×6): 30 mg via SUBCUTANEOUS
  Filled 2013-01-20 (×7): qty 0.3

## 2013-01-20 MED ORDER — POTASSIUM CHLORIDE CRYS ER 20 MEQ PO TBCR
20.0000 meq | EXTENDED_RELEASE_TABLET | Freq: Every day | ORAL | Status: DC | PRN
Start: 2013-01-20 — End: 2013-01-26

## 2013-01-20 MED ORDER — HEPARIN SODIUM (PORCINE) 1000 UNIT/ML IJ SOLN
INTRAMUSCULAR | Status: DC | PRN
  Administered 2013-01-20: 3000 [IU] via INTRAVENOUS
  Administered 2013-01-20: 8000 [IU] via INTRAVENOUS

## 2013-01-20 MED ORDER — SODIUM CHLORIDE 0.9 % IV SOLN: 500.0000 mL | Freq: Once | INTRAVENOUS | Status: AC | PRN

## 2013-01-20 MED ORDER — ACETAMINOPHEN 325 MG PO TABS
325.0000 mg | ORAL_TABLET | ORAL | Status: DC | PRN
  Administered 2013-01-23: 650 mg via ORAL
  Filled 2013-01-20: qty 2

## 2013-01-20 MED ORDER — BUDESONIDE-FORMOTEROL FUMARATE 160-4.5 MCG/ACT IN AERO
2.0000 | INHALATION_SPRAY | Freq: Two times a day (BID) | RESPIRATORY_TRACT | Status: DC
  Administered 2013-01-20 – 2013-01-26 (×9): 2 via RESPIRATORY_TRACT
  Filled 2013-01-20 (×2): qty 6

## 2013-01-20 MED ORDER — PHENYLEPHRINE HCL 10 MG/ML IJ SOLN
10.0000 mg | INTRAVENOUS | Status: DC | PRN
  Administered 2013-01-20: 20 ug/min via INTRAVENOUS

## 2013-01-20 MED ORDER — SODIUM CHLORIDE 0.9 % IR SOLN
Status: DC | PRN
  Administered 2013-01-20: 08:00:00

## 2013-01-20 MED ORDER — ALBUMIN HUMAN 5 % IV SOLN
INTRAVENOUS | Status: DC | PRN
  Administered 2013-01-20 (×2): via INTRAVENOUS

## 2013-01-20 MED ORDER — SPIRONOLACTONE 25 MG PO TABS
25.0000 mg | ORAL_TABLET | Freq: Every day | ORAL | Status: DC
  Administered 2013-01-21 – 2013-01-26 (×6): 25 mg via ORAL
  Filled 2013-01-20 (×6): qty 1

## 2013-01-20 MED ORDER — DULOXETINE HCL 60 MG PO CPEP
60.0000 mg | ORAL_CAPSULE | Freq: Every day | ORAL | Status: DC
  Administered 2013-01-21 – 2013-01-26 (×6): 60 mg via ORAL
  Filled 2013-01-20 (×6): qty 1

## 2013-01-20 MED ORDER — PANTOPRAZOLE SODIUM 40 MG PO TBEC
40.0000 mg | DELAYED_RELEASE_TABLET | Freq: Every day | ORAL | Status: DC
  Administered 2013-01-21 – 2013-01-26 (×6): 40 mg via ORAL
  Filled 2013-01-20 (×6): qty 1

## 2013-01-20 MED ORDER — NEOSTIGMINE METHYLSULFATE 1 MG/ML IJ SOLN
INTRAMUSCULAR | Status: DC | PRN
  Administered 2013-01-20: 3 mg via INTRAVENOUS

## 2013-01-20 MED ORDER — PHENYLEPHRINE HCL 10 MG/ML IJ SOLN
INTRAMUSCULAR | Status: DC | PRN
  Administered 2013-01-20 (×3): 80 ug via INTRAVENOUS

## 2013-01-20 MED ORDER — ARTIFICIAL TEARS OP OINT
TOPICAL_OINTMENT | OPHTHALMIC | Status: DC | PRN
  Administered 2013-01-20: 1 via OPHTHALMIC

## 2013-01-20 MED ORDER — ACETAMINOPHEN 650 MG RE SUPP: 325.0000 mg | RECTAL | Status: DC | PRN

## 2013-01-20 MED ORDER — BISACODYL 10 MG RE SUPP: 10.0000 mg | Freq: Every day | RECTAL | Status: DC | PRN

## 2013-01-20 MED ORDER — 0.9 % SODIUM CHLORIDE (POUR BTL) OPTIME
TOPICAL | Status: DC | PRN
  Administered 2013-01-20: 1000 mL

## 2013-01-20 SURGICAL SUPPLY — 50 items
ADH SKN CLS APL DERMABOND .7 (GAUZE/BANDAGES/DRESSINGS) ×2
BANDAGE GAUZE ELAST BULKY 4 IN (GAUZE/BANDAGES/DRESSINGS) ×1 IMPLANT
CANISTER SUCTION 2500CC (MISCELLANEOUS) ×2 IMPLANT
CLIP TI MEDIUM 24 (CLIP) ×2 IMPLANT
CLIP TI WIDE RED SMALL 24 (CLIP) ×2 IMPLANT
COVER SURGICAL LIGHT HANDLE (MISCELLANEOUS) ×2 IMPLANT
DERMABOND ADVANCED (GAUZE/BANDAGES/DRESSINGS) ×2
DERMABOND ADVANCED .7 DNX12 (GAUZE/BANDAGES/DRESSINGS) IMPLANT
DRAIN SNY WOU (WOUND CARE) IMPLANT
DRAPE WARM FLUID 44X44 (DRAPE) ×2 IMPLANT
DRSG EMULSION OIL 3X3 NADH (GAUZE/BANDAGES/DRESSINGS) ×1 IMPLANT
ELECT CAUTERY BLADE 6.4 (BLADE) ×1 IMPLANT
ELECT REM PT RETURN 9FT ADLT (ELECTROSURGICAL) ×2
ELECTRODE REM PT RTRN 9FT ADLT (ELECTROSURGICAL) ×1 IMPLANT
EVACUATOR SILICONE 100CC (DRAIN) IMPLANT
GEL ULTRASOUND 20GR AQUASONIC (MISCELLANEOUS) ×1 IMPLANT
GLOVE BIO SURGEON STRL SZ7.5 (GLOVE) ×2 IMPLANT
GLOVE BIOGEL PI IND STRL 7.0 (GLOVE) IMPLANT
GLOVE BIOGEL PI INDICATOR 7.0 (GLOVE) ×4
GLOVE ECLIPSE 6.5 STRL STRAW (GLOVE) ×3 IMPLANT
GLOVE SS BIOGEL STRL SZ 6.5 (GLOVE) IMPLANT
GLOVE SUPERSENSE BIOGEL SZ 6.5 (GLOVE) ×1
GLOVE SURG SS PI 6.5 STRL IVOR (GLOVE) ×2 IMPLANT
GOWN PREVENTION PLUS XLARGE (GOWN DISPOSABLE) ×2 IMPLANT
GOWN PREVENTION PLUS XXLARGE (GOWN DISPOSABLE) ×1 IMPLANT
GOWN STRL NON-REIN LRG LVL3 (GOWN DISPOSABLE) ×7 IMPLANT
GRAFT PROPATEN W/RING 6X80X60 (Vascular Products) ×1 IMPLANT
KIT BASIN OR (CUSTOM PROCEDURE TRAY) ×2 IMPLANT
KIT ROOM TURNOVER OR (KITS) ×2 IMPLANT
NS IRRIG 1000ML POUR BTL (IV SOLUTION) ×4 IMPLANT
PACK PERIPHERAL VASCULAR (CUSTOM PROCEDURE TRAY) ×2 IMPLANT
PAD ARMBOARD 7.5X6 YLW CONV (MISCELLANEOUS) ×4 IMPLANT
PENCIL BUTTON HOLSTER BLD 10FT (ELECTRODE) ×1 IMPLANT
SPONGE SURGIFOAM ABS GEL 100 (HEMOSTASIS) ×1 IMPLANT
STAPLER VISISTAT 35W (STAPLE) IMPLANT
SUT PROLENE 5 0 C 1 24 (SUTURE) ×2 IMPLANT
SUT PROLENE 6 0 CC (SUTURE) ×6 IMPLANT
SUT SILK 2 0 SH (SUTURE) ×2 IMPLANT
SUT SILK 3 0 (SUTURE)
SUT SILK 3-0 18XBRD TIE 12 (SUTURE) IMPLANT
SUT VIC AB 2-0 CTX 36 (SUTURE) ×3 IMPLANT
SUT VIC AB 3-0 SH 27 (SUTURE) ×4
SUT VIC AB 3-0 SH 27X BRD (SUTURE) ×2 IMPLANT
SUT VIC AB 4-0 PS2 27 (SUTURE) ×2 IMPLANT
TAPE CLOTH SURG 4X10 WHT LF (GAUZE/BANDAGES/DRESSINGS) ×1 IMPLANT
TOWEL OR 17X24 6PK STRL BLUE (TOWEL DISPOSABLE) ×4 IMPLANT
TOWEL OR 17X26 10 PK STRL BLUE (TOWEL DISPOSABLE) ×4 IMPLANT
TRAY FOLEY CATH 16FRSI W/METER (SET/KITS/TRAYS/PACK) ×2 IMPLANT
UNDERPAD 30X30 INCONTINENT (UNDERPADS AND DIAPERS) ×2 IMPLANT
WATER STERILE IRR 1000ML POUR (IV SOLUTION) ×2 IMPLANT

## 2013-01-20 NOTE — Interval H&P Note (Signed)
History and Physical Interval Note:  01/20/2013 7:26 AM  [name]  has presented today for surgery, with the diagnosis of Peripheral Vascular Disease/Ischemia of  Right Foot  The various methods of treatment have been discussed with the patient and family. After consideration of risks, benefits and other options for treatment, the patient has consented to  Procedure(s): BYPASS GRAFT RIGHT FEMORAL-BELOW KNEE POPLITEAL ARTERY (Right) as a surgical intervention .  The patient's history has been reviewed, patient examined, no change in status, stable for surgery.  I have reviewed the patient's chart and labs.  Questions were answered to the patient's satisfaction.     FIELDS,CHARLES E

## 2013-01-20 NOTE — Op Note (Signed)
Procedure: Right femoral to below knee popliteal bypass with Propaten, vein patch right popliteal artery  Preoperative diagnosis: Rest pain right foot with non healing ulcer  Postoperative diagnosis: Same  Anesthesia: General  Asst.: Della Goo, PA-C  Operative findings:     Severely diseased small right popliteal artery               6 mm ring supported PTFE propaten  Operative details: After obtaining informed consent, the patient was taken to the operating room. The patient was placed in supine position on the operating room table. After induction of general anesthesia and endotracheal intubation, a Foley catheter was placed. Next, the patient's entire right lower extremity was prepped and draped in the usual sterile fashion.   An incision was made on the medial aspect of the leg below the knee.  The saphenous vein had been harvested for CABG and was unusable.  The incision was deepened and the below knee popliteal space was entered.  The popliteal artery was dissected free circumferentially.  It was calcified in islands but was clampable.   A longitudinal incision was then made in the right groin and carried down through the subcutaneous tissues to expose the right common femoral artery. The common femoral artery was dissected free circumferentially. There was a pulse within the common femoral artery. The distal external iliac artery was dissected free circumferentially underneath the inguinal ligament.  A vessel loop was also placed around the distal external iliac artery. The profunda femoris and superficial femoral arteries were also dissected free circumferentially as well and vessel loops placed around these.  There was some calcification in the femoral artery as well but with some soft areas.  Next a vein branch was identified in the medial portion of the groin incision and this was harvested to be used as a vein patch for the distal anastomosis.  Side branches were ligated and divided  between silk ties or clips.  The vein was of good quality 2.5 mm diameter.  It was gently distended with heparinized saline and opened longitudinally.  The vein was then placed on the back table in case a patch was needed.  A tunnel was then created between the heads of the gastrocnemius muscle subsartorial up to the groin.  The patient was given 8000 units of heparin.  After appropriate circulation time, the distal left external iliac artery was controlled with a small Cooley clamp. The profunda was controlled with a vessel loop and the SFA controlled with a fistula clamp.   A longitudinal opening was made in the common femoral artery on its anterior surface.  The vessel was calcified and thickened and unsewable in its condition due to significant plaque.    A 6 mm ring supported Propaten graft was then brought up on the operative field and spatulated.  The graft was then sewn end of graft to side of artery using a running 5 0 Prolene.  Proximal clamp and distal clamps were removed and there was good pulsatile flow in the profunda femoris artery immediately.  The SFA was chronically occluded.  The graft was then brought through the subsartorial tunnel down to the below-knee popliteal artery. The below-knee popliteal artery was controlled proximally with a Henley clamp and distally with a vessel loop. A longitudinal opening was made in the distal below-knee popliteal artery in an area that was fairly free of calcification. The popliteal artery was severely diseased.  I felt that a vein patch angioplasty would improve patency.  The  previously harvested vein was placed in a reversed configuration and sewn on as a patch using a running 6 0 Prolene suture.  The graft was then cut to length and spatulated and sewn end of graft to side of artery using running 6-0 Prolene suture.  At completion of the anastomosis everything was forebled backbled and thoroughly flushed. The remainder of the anastomosis was completed and  all clamps were removed restoring pulsatile flow to the below-knee popliteal artery.  An arteriogram was not performed due to the patients low GFR and advanced age putting her at risk for renal dysfunction.  There was monophasic to biphasic Doppler flow in the posterior tibial and dorsalis pedis areas of the foot.  The DP signal was more prominent.  Three repair stitch was placed at the patch portion of the distal anastomosis.  The patient had been given an additional 3000 units of heparin during the case.    Each anastomosis was then packed with thrombin and gelfoam until hemostasis was obtained.  The wounds were then irrigated and the gelfoam removed.  After hemostasis was obtained, the deep layers and subcutaneous layers of the below-knee popliteal incision were closed with running 3-0 Vicryl suture. The skin was closed with a 4 0 Vicryl subcuticular suture.     The groin was inspected and found to be hemostatic. This was then closed in multiple layers of running 2 0 and 3-0 Vicryl suture and 4-0 subcuticular stitch. Dermabond was applied to the groin incision.  The patient tolerated the procedure well and there were no complications. Instrument sponge and needle counts correct in the case. Patient was taken to the recovery in stable condition.  Fabienne Bruns, MD [facility] [place] Office: [phone] Pager: [phone]

## 2013-01-20 NOTE — Anesthesia Postprocedure Evaluation (Signed)
  Anesthesia Post-op Note  Patient: [name]  Procedure(s) Performed: Procedure(s): BYPASS GRAFT RIGHT FEMORAL-BELOW KNEE POPLITEAL ARTERY WITH RINGED GORTEX GRAFT  (Right) VEIN  PATCH ANGIOPLASTY (Right)  Patient Location: PACU  Anesthesia Type:General  Level of Consciousness: awake, alert , oriented and patient cooperative  Airway and Oxygen Therapy: Patient Spontanous Breathing  Post-op Pain: mod  Post-op Assessment: Post-op Vital signs reviewed, Patient's Cardiovascular Status Stable, Respiratory Function Stable, Patent Airway, No signs of Nausea or vomiting and Pain level controlled  Post-op Vital Signs: stable  Complications: No apparent anesthesia complications

## 2013-01-20 NOTE — Anesthesia Preprocedure Evaluation (Signed)
Anesthesia Evaluation  Patient identified by MRN, date of birth, ID band Patient awake    Reviewed: Allergy & Precautions, H&P , NPO status , Patient's Chart, lab work & pertinent test results  Airway       Dental   Pulmonary shortness of breath, asthma , former smoker,          Cardiovascular hypertension, + angina + CAD, + Past MI, + CABG, + Peripheral Vascular Disease and +CHF     Neuro/Psych Depression CVA    GI/Hepatic GERD-  ,  Endo/Other  diabetes, Type 2, Insulin Dependent  Renal/GU Renal InsufficiencyRenal disease     Musculoskeletal   Abdominal   Peds  Hematology   Anesthesia Other Findings   Reproductive/Obstetrics                           Anesthesia Physical Anesthesia Plan  ASA: III  Anesthesia Plan: General   Post-op Pain Management:    Induction: Intravenous  Airway Management Planned: Oral ETT  Additional Equipment:   Intra-op Plan:   Post-operative Plan: Extubation in OR  Informed Consent: I have reviewed the patients History and Physical, chart, labs and discussed the procedure including the risks, benefits and alternatives for the proposed anesthesia with the patient or authorized representative who has indicated his/her understanding and acceptance.     Plan Discussed with: CRNA, Anesthesiologist and Surgeon  Anesthesia Plan Comments:         Anesthesia Quick Evaluation

## 2013-01-20 NOTE — Transfer of Care (Signed)
Immediate Anesthesia Transfer of Care Note  Patient: [name]  Procedure(s) Performed: Procedure(s): BYPASS GRAFT RIGHT FEMORAL-BELOW KNEE POPLITEAL ARTERY WITH RINGED GORTEX GRAFT  (Right) VEIN  PATCH ANGIOPLASTY (Right)  Patient Location: PACU  Anesthesia Type:General  Level of Consciousness: awake, alert  and patient cooperative  Airway & Oxygen Therapy: Patient Spontanous Breathing and Patient connected to nasal cannula oxygen  Post-op Assessment: Report given to PACU RN and Post -op Vital signs reviewed and stable  Post vital signs: Reviewed and stable  Complications: No apparent anesthesia complications

## 2013-01-20 NOTE — Anesthesia Procedure Notes (Signed)
Procedure Name: Intubation Date/Time: 01/20/2013 7:37 AM Performed by: Arlice Colt B Pre-anesthesia Checklist: Patient identified, Emergency Drugs available, Suction available, Patient being monitored and Timeout performed Patient Re-evaluated:Patient Re-evaluated prior to inductionOxygen Delivery Method: Circle system utilized Preoxygenation: Pre-oxygenation with 100% oxygen Intubation Type: IV induction Ventilation: Mask ventilation without difficulty Laryngoscope Size: Mac and 3 Grade View: Grade I Tube type: Oral Tube size: 7.5 mm Number of attempts: 1 Airway Equipment and Method: Stylet Placement Confirmation: ETT inserted through vocal cords under direct vision,  positive ETCO2 and breath sounds checked- equal and bilateral Secured at: 21 cm Tube secured with: Tape Dental Injury: Teeth and Oropharynx as per pre-operative assessment

## 2013-01-20 NOTE — Progress Notes (Signed)
Utilization review completed.  

## 2013-01-20 NOTE — H&P (View-Only) (Signed)
[facility] [place] HISTORY AND PHYSICAL   History of Present Illness:  Patient is a 66 y.o. year old female who presents for evaluation of right foot ischemia. The patient has complained of right foot heaviness and a "dead sensation" and purplish color changes since having a fall 3 weeks ago. She also reports decreased range of motion. She denies prior history of claudication. However she does not walk much. She has fairly severe heart failure with her last ejection fraction being 15-20%. She is followed by Dr. not certain cardiology. There considering transferring her care to Dr. Gala Romney in the heart failure clinic. Other medical problems include coronary artery disease, diabetes, hypertension, asthma, bronchitis, renal dysfunction (creatinine 1.4 in May). The patient is on home oxygen at night.  Past Medical History  Diagnosis Date  . Stroke 05/2011  . Coronary artery disease     a. s/p CABG 2007 (L-RI, S-dRCA, S-LAD, S-OM);  b. myoview 6/13:  anteroapical MI, no ischemia, EF 31%  . Diabetes mellitus   . Hypertension   . 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 Asthma   . Bronchitis   . Reflux   . Hyperlipidemia   . Chronic systolic heart failure     a. echo 6/13: mild LVH, EF 15-20%, diff HK, Gr 1 DD, mild reduced RVSF, PASP 32  . GERD (gastroesophageal reflux disease)   . Arthritis   . CKD (chronic kidney disease)     Past Surgical History  Procedure Laterality Date  . Coronary artery bypass graft      LIMA to RI, SVG to distal RCA, SVG to LAD, SVG to OM. 2007  . Joint replacement      Right hip replacement   . Debridement skin / sq / muscle of trunk      s/p staph infection from CABG 2007  . Cystectomy      Subcutaneous  . Partial hip arthroplasty    . Stroke       Social History History  Substance Use Topics  . Smoking status: Current Every Day Smoker -- 0.50 packs/day for 50 years    Types: Cigarettes  . Smokeless tobacco: Never Used  .  Alcohol Use: No    Family History Family History  Problem Relation Age of Onset  . Coronary artery disease Father 47    Unclear if it was an MI  . Stroke Mother 31  . Coronary artery disease Brother 73  . Coronary artery disease Brother 82    Allergies  Allergies  Allergen Reactions  . Codeine     unknown  . Ivp Dye [Iodinated Diagnostic Agents]     unknown  . Penicillins     unknown  . Sulfa Antibiotics     unknown     Current Outpatient Prescriptions  Medication Sig Dispense Refill  . albuterol (PROVENTIL HFA;VENTOLIN HFA) 108 (90 BASE) MCG/ACT inhaler Inhale 2 puffs into the lungs every 6 (six) hours as needed. For shortness of breath/wheezing      . ALPRAZolam (XANAX) 0.5 MG tablet Take 0.5 mg by mouth daily as needed. For anxiety      . budesonide-formoterol (SYMBICORT) 160-4.5 MCG/ACT inhaler Inhale 2 puffs into the lungs 2 (two) times daily.       . carvedilol (COREG) 12.5 MG tablet Take 0.5 tablets (6.25 mg total) by mouth every 12 (twelve) hours.  60 tablet    . clopidogrel (PLAVIX) 75 MG tablet Take 75 mg by mouth  daily.      . diphenhydrAMINE (BENADRYL) 25 mg capsule Take 25 mg by mouth every 6 (six) hours as needed. For allergies      . DULoxetine (CYMBALTA) 60 MG capsule Take 60 mg by mouth daily.      Marland Kitchen HYDROcodone-acetaminophen (NORCO) 5-325 MG per tablet Take 1 tablet by mouth every 8 (eight) hours as needed for pain.  30 tablet  0  . insulin glargine (LANTUS) 100 UNIT/ML injection Inject 28 Units into the skin at bedtime.      . insulin lispro (HUMALOG) 100 UNIT/ML injection Inject 6-11 Units into the skin 3 (three) times daily before meals. Per sliding scale      . losartan (COZAAR) 50 MG tablet Take 1 tablet (50 mg total) by mouth daily.  30 tablet  5  . nitroGLYCERIN (NITROSTAT) 0.4 MG SL tablet Place 0.4 mg under the tongue every 5 (five) minutes as needed. For chest pain.      . pantoprazole (PROTONIX) 40 MG tablet Take 40 mg by mouth daily.      .  pregabalin (LYRICA) 50 MG capsule Take 50 mg by mouth 2 (two) times daily.      . rosuvastatin (CRESTOR) 40 MG tablet Take 40 mg by mouth daily.      Marland Kitchen spironolactone (ALDACTONE) 25 MG tablet Take 1 tablet (25 mg total) by mouth daily.  30 tablet  5  . torsemide (DEMADEX) 20 MG tablet Take 2 tablets (40 mg total) by mouth 2 (two) times daily.  120 tablet  5  . [DISCONTINUED] potassium chloride (K-DUR,KLOR-CON) 10 MEQ tablet Take 10 mEq by mouth daily.        No current facility-administered medications for this visit.    ROS:   General:  No weight loss, Fever, chills  HEENT: No recent headaches, no nasal bleeding, no visual changes, no sore throat  Neurologic: No dizziness, blackouts, seizures. No recent symptoms of stroke or mini- stroke. No recent episodes of slurred speech, or temporary blindness.  Cardiac: No recent episodes of chest pain/pressure, no shortness of breath at rest.  No shortness of breath with exertion.  Denies history of atrial fibrillation or irregular heartbeat  Vascular: No history of rest pain in feet.  No history of claudication.  No history of non-healing ulcer, No history of DVT   Pulmonary: No home oxygen, no productive cough, no hemoptysis,  No asthma or wheezing  Musculoskeletal:  [ ]  Arthritis, [ ]  Low back pain,  [ ]  Joint pain  Hematologic:No history of hypercoagulable state.  No history of easy bleeding.  No history of anemia  Gastrointestinal: No hematochezia or melena,  No gastroesophageal reflux, no trouble swallowing  Urinary: [ ]  chronic Kidney disease, [ ]  on HD - [ ]  MWF or [ ]  TTHS, [ ]  Burning with urination, [ ]  Frequent urination, [ ]  Difficulty urinating;   Skin: No rashes  Psychological: No history of anxiety,  No history of depression   Physical Examination  Filed Vitals:   12/25/12 1200  BP: 119/89  Pulse: 73  Height: 5' (1.524 m)  Weight: 159 lb (72.122 kg)  SpO2: 100%    Body mass index is 31.05 kg/(m^2).  General:   Alert and oriented, no acute distress HEENT: Normal Neck: No bruit or JVD Pulmonary: Clear to auscultation bilaterally Cardiac: Regular Rate and Rhythm without murmur Abdomen: Soft, non-tender, non-distended, no mass Skin: No rash, bluish congested appearance to both feet suggestive of congestive failure. However she  also has dependent rubor of the right foot no ulcerations Extremity Pulses:  2+ radial, brachial, absent femoral, dorsalis pedis, posterior tibial pulses bilaterally Musculoskeletal: No deformity or edema  Neurologic: Upper and lower extremity motor 5/5 and symmetric  DATA: Duplex ultrasound dated August 21 is reviewed today. I reviewed and interpreted this study. She also had bilateral ABIs. 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 Her ABIs in the appearance of her foot is consistent with possible limb threatening ischemia.   PLAN:  I offered the patient aortogram and lower extremity runoff with possible intervention tomorrow. However she wishes to defer this to August 29. Risks benefits possible complications and procedure details including but not limited to vessel injury bleeding infection renal dysfunction were explained the patient today. She understands and agrees to proceed.  If the lesion is not amenable to percutaneous intervention she would need review by cardiology prior to considering bypass.   Tobacco cessation was emphasized and greater than 3 minutes they were spent regarding smoking cessation counseling.  Fabienne Bruns, MD [facility] Office: [phone] Pager: [phone]

## 2013-01-21 LAB — BASIC METABOLIC PANEL
CO2: 26 mEq/L (ref 19–32)
Calcium: 9 mg/dL (ref 8.4–10.5)
Chloride: 105 mEq/L (ref 96–112)
Glucose, Bld: 128 mg/dL — ABNORMAL HIGH (ref 70–99)
Sodium: 139 mEq/L (ref 135–145)

## 2013-01-21 LAB — CBC
Hemoglobin: 12 g/dL (ref 12.0–15.0)
MCH: 30.2 pg (ref 26.0–34.0)
MCV: 91.7 fL (ref 78.0–100.0)
Platelets: 113 10*3/uL — ABNORMAL LOW (ref 150–400)
RBC: 3.97 MIL/uL (ref 3.87–5.11)
WBC: 9.5 10*3/uL (ref 4.0–10.5)

## 2013-01-21 LAB — GLUCOSE, CAPILLARY: Glucose-Capillary: 135 mg/dL — ABNORMAL HIGH (ref 70–99)

## 2013-01-21 NOTE — Progress Notes (Signed)
In and out cath per MD order.  480cc of clear yellow obtained, some sediment noted, malodorous.  Will obtain a bladder scan if no void in 6 hours.

## 2013-01-21 NOTE — Plan of Care (Signed)
Problem: Acute Rehab OT Goals (only OT should resolve) Goal: OT Additional ADL Goal #1 Pt will sit EOB with max A bed mobility for grooming and UB ADLs with min support for balance

## 2013-01-21 NOTE — Progress Notes (Addendum)
  VASCULAR SURGERY BYPASS PROGRESS NOTE   1 Day Post-Op Right femoral to below knee popliteal bypass with Propaten, vein patch right popliteal artery   SUBJECTIVE: pt somnolent but easily arousable. She was like this after her arteriogram.  Some groin pain. Controlled with pain meds No pain in the foot  PHYSICAL EXAM: BP Readings from Last 3 Encounters:  01/21/13 103/52  01/21/13 103/52  01/13/13 133/76   Temp Readings from Last 3 Encounters:  01/21/13 99.1 F (37.3 C) Oral  01/21/13 99.1 F (37.3 C) Oral  01/13/13 98 F (36.7 C) Oral   Pulse Readings from Last 3 Encounters:  01/21/13 95  01/21/13 95  01/13/13 77   SpO2 Readings from Last 3 Encounters:  01/21/13 96%  01/21/13 96%  01/13/13 93%     Intake/Output Summary (Last 24 hours) at 01/21/13 0757 Last data filed at 01/21/13 1610  Gross per 24 hour  Intake 4101.25 ml  Output   1125 ml  Net 2976.25 ml    Extremities: Incisions clean, dry and intact DP/PT/peroneal - good monophasic doppler signal Right foot warm - great toe cyanosis resolved  LABS: Lab Results  Component Value Date   WBC 9.5 01/21/2013   HGB 12.0 01/21/2013   HCT 36.4 01/21/2013   MCV 91.7 01/21/2013   PLT 113* 01/21/2013   Lab Results  Component Value Date   CREATININE 1.42* 01/21/2013   Lab Results  Component Value Date   INR 1.09 01/13/2013       ASSESSMENT: 1 Day Post-Op Right femoral to below knee popliteal bypass with Propaten, vein patch right popliteal artery Incisions healing well Somnolent - will DC IV pain meds  PLAN:  Ambulate with PT/OT  GU: Foley out POD 1  Wound Management: keep pressure off right outer heel  DVT prophylaxis: lovenox Transfer to 2000    Agree with above.  Foot is much warm and subjectively feels better Will ambulate today Hopefully home next 1-2 days  Fabienne Bruns, MD [facility] [place] Office: [phone] Pager: [phone]

## 2013-01-21 NOTE — Progress Notes (Signed)
Physical Therapy Evaluation Patient Details Name: SPENSER CONG MRN: [id number] DOB: 1945/07/19 Today's Date: 01/21/2013 Time: 9562-1308 PT Time Calculation (min): 19 min  PT Assessment / Plan / Recommendation History of Present Illness  Pt admit for right femoral to BK pop BPG.    Clinical Impression  Pt admitted with right femoral to BK pop BPG. Pt currently with functional limitations due to the deficits listed below (see PT Problem List). Pt will need therapy at Rehab prior to d/c home unless pt improves over next few sessions.  Pt will benefit from skilled PT to increase their independence and safety with mobility to allow discharge to the venue listed below.    PT Assessment  Patient needs continued PT services    Follow Up Recommendations  CIR;Supervision/Assistance - 24 hour                Equipment Recommendations  None recommended by PT    Recommendations for Other Services Rehab consult   Frequency Min 3X/week    Precautions / Restrictions Precautions Precautions: Fall Restrictions Weight Bearing Restrictions: No   Pertinent Vitals/Pain VSS, 10/10 pain right foot      Mobility  Bed Mobility Bed Mobility: Sit to Supine Sit to Supine: 1: +2 Total assist;HOB flat Sit to Supine: Patient Percentage: 50% Details for Bed Mobility Assistance: Pt needed total assisst to bring LEs onto bed due to soreness LEs.  Also some assist with descent of trunk onto bed.   Transfers Transfers: Sit to Stand;Stand to Sit;Stand Pivot Transfers Sit to Stand: 1: +2 Total assist;With upper extremity assist;With armrests;From chair/3-in-1 Sit to Stand: Patient Percentage: 40% Stand to Sit: 1: +2 Total assist;With upper extremity assist;To bed Stand to Sit: Patient Percentage: 50% Stand Pivot Transfers: 1: +2 Total assist Stand Pivot Transfers: Patient Percentage: 50% Details for Transfer Assistance: Pt needed cues for hand placement.   Pt also needed assist for sit to stand.  Pain  limiting evaluation quite a bit.  Pt needed cues for postural control as well as pt maintained a somewhat flexed posture.   Ambulation/Gait Ambulation/Gait Assistance: Not tested (comment) Stairs: No Wheelchair Mobility Wheelchair Mobility: No         PT Diagnosis: Generalized weakness;Acute pain  PT Problem List: Decreased activity tolerance;Decreased balance;Decreased mobility;Decreased knowledge of use of DME;Decreased safety awareness;Decreased knowledge of precautions;Pain PT Treatment Interventions: DME instruction;Gait training;Functional mobility training;Therapeutic activities;Therapeutic exercise;Balance training;Patient/family education     PT Goals(Current goals can be found in the care plan section) Acute Rehab PT Goals Patient Stated Goal: to go home with assist PT Goal Formulation: With patient Time For Goal Achievement: 02/04/13 Potential to Achieve Goals: Good  Visit Information  Last PT Received On: 01/21/13 Assistance Needed: +2 History of Present Illness: Pt admit for right femoral to BK pop BPG.         Prior Functioning  Home Living Family/patient expects to be discharged to:: Private residence Living Arrangements: Other relatives Available Help at Discharge: Family;Available 24 hours/day Type of Home: House Home Access: Stairs to enter [employer] of Steps: 3 Entrance Stairs-Rails: Right Home Layout: One level Home Equipment: Cane - single point;Walker - 2 wheels Additional Comments: Pt stated she used cane outdoors Prior Function Level of Independence: Independent with assistive device(s) Communication Communication: Expressive difficulties Dominant Hand: Right    Cognition  Cognition Arousal/Alertness: Awake/alert Behavior During Therapy: Anxious Overall Cognitive Status: History of cognitive impairments - at baseline Area of Impairment: Memory;Orientation;Attention;Following commands;Safety/judgement;Awareness;Problem  solving Orientation Level: Disoriented to;Place;Time;Situation  Current Attention Level: Focused Memory: Decreased short-term memory Following Commands: Follows one step commands inconsistently;Follows one step commands with increased time Safety/Judgement: Decreased awareness of deficits;Decreased awareness of safety Problem Solving: Slow processing;Difficulty sequencing;Requires verbal cues;Requires tactile cues General Comments: Pt with prior CVA with delayed processing PTA.    Extremity/Trunk Assessment Upper Extremity Assessment Upper Extremity Assessment: Defer to OT evaluation Lower Extremity Assessment Lower Extremity Assessment: RLE deficits/detail RLE: Unable to fully assess due to pain   Balance    End of Session PT - End of Session Equipment Utilized During Treatment: Gait belt;Oxygen Activity Tolerance: Patient limited by fatigue;Patient limited by pain Patient left: in bed;with call bell/phone within reach;with bed alarm set Nurse Communication: Mobility status       [name],Mikale Silversmith 01/21/2013, 11:38 AM [facility] [phone] (pager)

## 2013-01-21 NOTE — Progress Notes (Signed)
Rehab Admissions Coordinator Note:  Patient was screened by Clois Dupes for appropriateness for an Inpatient Acute Rehab Consult.  At this time, we are recommending Skilled Nursing Facility.  [employer] Medicare will not approve an inpt rehab admission for this diagnosis.   Clois Dupes 01/21/2013, 11:48 AM  I can be reached at [phone].

## 2013-01-21 NOTE — Evaluation (Signed)
Occupational Therapy Evaluation Patient Details Name: [name] MRN: [id number] DOB: 11/17/1945 Today's Date: 01/21/2013 Time: 6578-4696 OT Time Calculation (min): 21 min  OT Assessment / Plan / Recommendation History of present illness Pt admit for right femoral to BK pop BPG.     Clinical Impression   Pt demos decline in function with ADLs and ADL m,obility safety with decreased endurance and strength, Pt with cognitive impairments/confusion. Pt would benefit from acute OT services to address impairments to increase level of function and safety    OT Assessment  Patient needs continued OT Services    Follow Up Recommendations  SNF;Supervision/Assistance - 24 hour    Barriers to Discharge Decreased caregiver support Uncertain if family would be able to provide adequate and safe, extensive assist pt needs at this time  Equipment Recommendations  None recommended by OT    Recommendations for Other Services    Frequency  Min 2X/week    Precautions / Restrictions Precautions Precautions: Fall Precaution Comments: R LE painful Restrictions Weight Bearing Restrictions: No   Pertinent Vitals/Pain Pain not rated, but pt groaning and states " it just hurts " ( R LE area )    ADL  Grooming: Performed;Wash/dry hands;Wash/dry face;Min guard;Set up Where Assessed - Grooming: Supported sitting Upper Body Bathing: Simulated;Maximal assistance Lower Body Bathing: Simulated;+1 Total assistance Upper Body Dressing: Performed;Maximal assistance Lower Body Dressing: +1 Total assistance Transfers/Ambulation Related to ADLs: Pt unable to perform BSC transfer due to pain limiting mobility. Has been able to perform with nrsg staff per notes ADL Comments: pt's grand dtr present duirng session    OT Diagnosis: Generalized weakness;Acute pain  OT Problem List: Decreased strength;Pain;Decreased activity tolerance;Decreased safety awareness;Decreased cognition;Impaired balance (sitting  and/or standing);Decreased knowledge of precautions;Decreased knowledge of use of DME or AE OT Treatment Interventions: Self-care/ADL training;Therapeutic activities;Therapeutic exercise;Neuromuscular education;Patient/family education;DME and/or AE instruction;Balance training   OT Goals(Current goals can be found in the care plan section) Acute Rehab OT Goals Patient Stated Goal: to go home with assist OT Goal Formulation: With patient/family Time For Goal Achievement: 01/28/13 Potential to Achieve Goals: Good ADL Goals Pt Will Perform Grooming: with set-up;with supervision;sitting Pt Will Perform Upper Body Bathing: with mod assist;sitting Pt Will Perform Lower Body Bathing: with max assist;with mod assist;sitting/lateral leans Pt Will Perform Upper Body Dressing: with mod assist;sitting Pt Will Transfer to Toilet: with total assist;with max assist;bedside commode  Visit Information  Last OT Received On: 01/21/13 Assistance Needed: +2 History of Present Illness: Pt admit for right femoral to BK pop BPG.         Prior Functioning     Home Living Family/patient expects to be discharged to:: Private residence Living Arrangements: Other relatives Available Help at Discharge: Family;Available 24 hours/day Type of Home: House Home Access: Stairs to enter [employer] of Steps: 3 Entrance Stairs-Rails: Right Home Layout: One level Home Equipment: Cane - single point;Walker - 2 wheels Additional Comments: Pt stated she used cane outdoors Prior Function Level of Independence: Independent with assistive device(s) Communication Communication: Expressive difficulties Dominant Hand: Right         Vision/Perception Vision - History Baseline Vision: Wears glasses only for reading Patient Visual Report: No change from baseline Perception Perception: Within Functional Limits   Cognition  Cognition Arousal/Alertness: Lethargic Behavior During Therapy:  Anxious Overall Cognitive Status: History of cognitive impairments - at baseline Area of Impairment: Memory;Orientation;Attention;Following commands;Safety/judgement;Awareness;Problem solving Orientation Level: Disoriented to;Place;Time;Situation Current Attention Level: Focused Memory: Decreased short-term memory Following Commands: Follows one step  commands inconsistently;Follows one step commands with increased time Safety/Judgement: Decreased awareness of deficits;Decreased awareness of safety Problem Solving: Slow processing;Difficulty sequencing;Requires verbal cues;Requires tactile cues General Comments: Pt with prior CVA with delayed processing PTA.    Extremity/Trunk Assessment Upper Extremity Assessment Upper Extremity Assessment: Overall WFL for tasks assessed;Generalized weakness     Mobility Bed Mobility Bed Mobility: Sit to Supine;Scooting to [facility];Sitting - Scoot to [place] of Bed;Supine to Sit Supine to Sit: 1: +2 Total assist;HOB elevated Sitting - Scoot to Edge of Bed: 1: +1 Total assist Sit to Supine: 1: +2 Total assist;HOB flat Sit to Supine: Patient Percentage: 50% Scooting to HOB: 1: +1 Total assist Details for Bed Mobility Assistance: Pt needed total assisst to bring LEs onto bed due to soreness LEs.  Also some assist with descent of trunk onto bed.   Transfers Transfers: Not assessed        Exercise     Balance Balance Balance Assessed: No   End of Session OT - End of Session Activity Tolerance: Patient limited by fatigue;Patient limited by lethargy;Patient limited by pain Patient left: in bed;with call bell/phone within reach;with bed alarm set;with family/visitor present  GO     Galen Manila 01/21/2013, 2:34 PM

## 2013-01-21 NOTE — Progress Notes (Addendum)
Pt transferred to 2W11. All belongings sent with pt at time of transfer. Pt on 2L [place], VSS per monitor. Report and care handoff given to [facility], [place]. Daughter [name] updated on phone of transfer.    Delynn Flavin, RN

## 2013-01-21 NOTE — Progress Notes (Signed)
Pt still unable to void after attempting to urinate on bed pan. Bladder scan showed questionable . Notified MD of this. Md said to wait 2 more hours to see if pt can void, if unable, then place foley catheter. Will pass on to night shift.

## 2013-01-21 NOTE — Progress Notes (Signed)
Pt unable to void after foley removed at 0600. Helped pt to bedside commode, pt unable to void but has feeling that she needs to. Bladder scanned pt and showed . Called MD and ordered in and out cath.

## 2013-01-22 ENCOUNTER — Encounter (HOSPITAL_COMMUNITY): Payer: Self-pay | Admitting: Vascular Surgery

## 2013-01-22 ENCOUNTER — Inpatient Hospital Stay (HOSPITAL_COMMUNITY): Payer: Medicare Other

## 2013-01-22 DIAGNOSIS — Z48812 Encounter for surgical aftercare following surgery on the circulatory system: Secondary | ICD-10-CM

## 2013-01-22 DIAGNOSIS — I739 Peripheral vascular disease, unspecified: Secondary | ICD-10-CM

## 2013-01-22 LAB — GLUCOSE, CAPILLARY: Glucose-Capillary: 114 mg/dL — ABNORMAL HIGH (ref 70–99)

## 2013-01-22 NOTE — Progress Notes (Signed)
VASCULAR LAB PRELIMINARY  ARTERIAL  ABI completed:    RIGHT    LEFT    PRESSURE WAVEFORM  PRESSURE WAVEFORM  BRACHIAL 97 Triphasic BRACHIAL 87 Triphasic  DP 82 Dampened Moniophasic DP 94 Monophasic  PT 94 Moniophasic PT 69 Dampened Monophasic    RIGHT LEFT  ABI 0.97 0.97   ABIs indicate normal arterial flow bilaterally. Abnormal Doppler waveforms  Would suggest a possible false  Elevation.  Leeam Cedrone, RVS 01/22/2013, 1:30 PM

## 2013-01-22 NOTE — Progress Notes (Addendum)
Voided spontaneously this am, still not really ambulatory Pain seems well controlled  Filed Vitals:   01/21/13 1708 01/21/13 2020 01/22/13 0533 01/22/13 0801  BP: 106/55 104/66 109/48 101/44  Pulse:  66 76   Temp:  98.8 F (37.1 C) 97.5 F (36.4 C)   TempSrc:  Oral Oral   Resp:  18 18   Height:      Weight:      SpO2:  94% 94%    Right leg incisions healing no erythema or drainage Right foot pink warm  CBC    Component Value Date/Time   WBC 9.5 01/21/2013 0340   WBC 9.2 12/19/2012 1601   RBC 3.97 01/21/2013 0340   RBC 4.66 12/19/2012 1601   HGB 12.0 01/21/2013 0340   HGB 13.7 12/19/2012 1601   HCT 36.4 01/21/2013 0340   HCT 42.9 12/19/2012 1601   PLT 113* 01/21/2013 0340   MCV 91.7 01/21/2013 0340   MCV 92.0 12/19/2012 1601   MCH 30.2 01/21/2013 0340   MCH 29.4 12/19/2012 1601   MCHC 33.0 01/21/2013 0340   MCHC 31.9 12/19/2012 1601   RDW 15.5 01/21/2013 0340   LYMPHSABS 2.0 05/17/2011 1358   MONOABS 0.6 05/17/2011 1358   EOSABS 0.2 05/17/2011 1358   BASOSABS 0.0 05/17/2011 1358    BMET    Component Value Date/Time   NA 139 01/21/2013 0340   K 4.7 01/21/2013 0340   CL 105 01/21/2013 0340   CO2 26 01/21/2013 0340   GLUCOSE 128* 01/21/2013 0340   BUN 29* 01/21/2013 0340   CREATININE 1.42* 01/21/2013 0340   CALCIUM 9.0 01/21/2013 0340   GFRNONAA 38* 01/21/2013 0340   GFRAA 44* 01/21/2013 0340    A: patent bypass right fem BK pop P 1.  Ambulate    2. If no improvement today needs SNF    3. Possible d/c tomorrow vs SNF  Fabienne Bruns, MD [facility] [place] Office: [phone] Pager: [phone]

## 2013-01-22 NOTE — Care Management Note (Signed)
    Page 1 of 2   01/26/2013     11:39:03 AM   CARE MANAGEMENT NOTE 01/26/2013  Patient:  [name], [name]   Account Number:  [id number]  Date Initiated:  01/22/2013  Documentation initiated by:  Caili Escalera  Subjective/Objective Assessment:   PT ADM S/P RT FEM POP ON 01/20/13.  PTA, PT LIVES WITH FAMILY MEMBERS.  SHE IS ON CHRONIC HOME OXYGEN.     Action/Plan:   P.T. /O.T. RECOMMENDING SNF FOR REHAB AT DC.  WILL CONSULT CSW TO FACILITATE DC TO SNF WHEN MEDICALLY STABLE FOR DC.   Anticipated DC Date:  01/26/2013   Anticipated DC Plan:  HOME W HOME HEALTH SERVICES  In-house referral  Clinical Social Worker      DC Planning Services  CM consult      [facility] Choice  HOME HEALTH   Choice offered to / List presented to:  C-1 Patient        HH arranged  HH-1 RN  HH-2 PT  HH-3 OT  HH-6 SOCIAL WORKER      HH agency  [facility]   Status of service:  Completed, signed off Medicare Important Message given?   (If response is "NO", the following Medicare IM given date fields will be blank) Date Medicare IM given:   Date Additional Medicare IM given:    Discharge Disposition:  HOME W HOME HEALTH SERVICES  Per UR Regulation:  Reviewed for med. necessity/level of care/duration of stay  If discussed at Long Length of Stay Meetings, dates discussed:    Comments:  01/26/13 Whyatt Klinger,RN,BSN [phone] PT FOR DC TODAY; HAS DECIDED TO GO HOME WITH DAUGHTER AS CAREGIVER.  STATES DAUGHTER WILL BE THERE 24H/DAY.  WILL NEED HH FOLLOW UP; REFERRAL TO [facility], PER PT CHOICE.  START OF CARE 24-48H POST DC DATE.  PT HAS CANE, RW, AND WC AT HOME.  DENIES NEED FOR ANY ADDITIONAL HOME DME.  01/23/13 Evva Din,RN,BSN [phone] INFORMED BY CSW THAT PT/FAMILY REFUSING SNF; STATES THEY PREFER HOME WITH HH CARE INSTEAD.  BEDSIDE NURSE CONCERNED ABOUT THIS, AS PT UNABLE TO EVEN HOLD PILL CUP.  PT WILL NEED MAX ASSISTANCE AT DC.  MAY NEED PHYSICIAN TO SPEAK WITH FAMILY REGARDING POSSIBLE REHAB  AT DC, UNLESS PT MAKES MORE PROGRESS.  WILL FOLLOW PROGRESS; HOPEFUL THAT IF SHE IMPROVES WILL BE ABLE TO SEND HOME WITH HH CARE.

## 2013-01-22 NOTE — Progress Notes (Signed)
Physical Therapy Treatment Patient Details Name: [name] MRN: [id number] DOB: 06-25-1945 Today's Date: 01/22/2013 Time: 7829-5621 PT Time Calculation (min): 29 min  PT Assessment / Plan / Recommendation  History of Present Illness Pt admit for right femoral to BK pop BPG.     PT Comments   Pt with continued AMS impairing ability to follow commands as well as pain inhibiting mobility. Pt educated for transfers, gait and function. Pt requires encouragement and max assist to initiate mobility and continues to require 2 person assist for safety with transfers with lack of control of transfers.    Follow Up Recommendations        Does the patient have the potential to tolerate intense rehabilitation     Barriers to Discharge        Equipment Recommendations       Recommendations for Other Services    Frequency     Progress towards PT Goals Progress towards PT goals: Progressing toward goals (very slowly)  Plan Current plan remains appropriate    Precautions / Restrictions Precautions Precautions: Fall   Pertinent Vitals/Pain Pt with O2 partially on on arrival with sats 83% on RA, on 4L maintained 92% throughout Pt reports pain at incision RLE and with cramping of distal right hamstring in stance     Mobility  Bed Mobility Supine to Sit: 3: Mod assist;HOB elevated Sitting - Scoot to Edge of Bed: 3: Mod assist Details for Bed Mobility Assistance: pt with assist to bring legs to EOB, elevate trunk and scoot to EOB with max cueing and trunk control as in sitting pt with posterior lean and unable to correct without assist Transfers Sit to Stand: 1: +2 Total assist;With upper extremity assist;With armrests;From chair/3-in-1 Sit to Stand: Patient Percentage: 60% Stand to Sit: 3: Mod assist;To chair/3-in-1 Stand Pivot Transfers: 1: +2 Total assist Stand Pivot Transfers: Patient Percentage: 60% Details for Transfer Assistance: cueing for hand placement and safety x 3 trials.  Initial standing pt ambulated 1 foot then demanded to sit with decreased control of pivot to chair and barely landing on surface. 3rd trial pt also without control of descent and not following commands chair pulled under pt. Pt with assist for anterior translation to stand Ambulation/Gait Ambulation/Gait Assistance: 1: +2 Total assist Ambulation/Gait: Patient Percentage: 70% Ambulation Distance (Feet): 5 Feet (1', 5', 3' respectively) Assistive device: Rolling walker Ambulation/Gait Assistance Details: decreased step length, pt maintaining flexed posture with propping on forearms on RW despite cues for posture and sequence  Gait Pattern: Step-to pattern;Trunk flexed;Narrow base of support;Decreased stance time - right Gait velocity: decreased Stairs: No    Exercises General Exercises - Lower Extremity Long Arc Quad: AAROM;Right;5 reps;Seated   PT Diagnosis:    PT Problem List:   PT Treatment Interventions:     PT Goals (current goals can now be found in the care plan section)    Visit Information  Last PT Received On: 01/22/13 Assistance Needed: +2 (safety with ambulation) History of Present Illness: Pt admit for right femoral to BK pop BPG.      Subjective Data      Cognition  Cognition Arousal/Alertness: Lethargic Behavior During Therapy: Anxious Overall Cognitive Status: History of cognitive impairments - at baseline Area of Impairment: Memory;Orientation;Attention;Following commands;Safety/judgement;Awareness;Problem solving Orientation Level: Disoriented to;Time Current Attention Level: Focused Memory: Decreased short-term memory Following Commands: Follows one step commands inconsistently;Follows one step commands with increased time Safety/Judgement: Decreased awareness of deficits;Decreased awareness of safety Problem Solving: Slow processing;Difficulty sequencing;Requires verbal  cues;Requires tactile cues    Balance  Balance Balance Assessed: Yes Static Sitting  Balance Static Sitting - Balance Support: Bilateral upper extremity supported;Feet supported Static Sitting - Level of Assistance: 3: Mod assist Static Sitting - Comment/# of Minutes: 5  End of Session PT - End of Session Equipment Utilized During Treatment: Gait belt;Oxygen Activity Tolerance: Patient limited by fatigue;Patient limited by pain Patient left: in chair;with call bell/phone within reach Nurse Communication: Mobility status   GP     Delorse Lek 01/22/2013, 12:21 PM Delaney Meigs, PT [phone]

## 2013-01-22 NOTE — Progress Notes (Signed)
Pt unable to void until this afternoon- output. Notified PA. Also notified that pt has still been very lethargic and BP 89/50. No new orders- will continue to monitor.

## 2013-01-22 NOTE — Progress Notes (Signed)
Placed vasaline gauze with dry gauze and kerliz over wounds on right ankle per MD order. No drainage noted. Pt tolerated well. Will continue to monitor.

## 2013-01-22 NOTE — Progress Notes (Signed)
[facility] [place]  Progress Note Bypass Surgery  Date of Surgery: 01/20/2013  Procedure(s): BYPASS GRAFT RIGHT FEMORAL-BELOW KNEE POPLITEAL ARTERY WITH RINGED GORTEX GRAFT  VEIN  PATCH ANGIOPLASTY Surgeon: Surgeon(s): Sherren Kerns, MD  2 Days Post-Op  History of Present Illness  [name] is a 67 y.o. female who is  2 Days Post-Op. The patient's pre-op symptoms of pain are Improved . Patients pain is well controlled.      Imaging: No results found.  Significant Diagnostic Studies: CBC Lab Results  Component Value Date   WBC 9.5 01/21/2013   HGB 12.0 01/21/2013   HCT 36.4 01/21/2013   MCV 91.7 01/21/2013   PLT 113* 01/21/2013    BMET    Component Value Date/Time   NA 139 01/21/2013 0340   K 4.7 01/21/2013 0340   CL 105 01/21/2013 0340   CO2 26 01/21/2013 0340   GLUCOSE 128* 01/21/2013 0340   BUN 29* 01/21/2013 0340   CREATININE 1.42* 01/21/2013 0340   CALCIUM 9.0 01/21/2013 0340   GFRNONAA 38* 01/21/2013 0340   GFRAA 44* 01/21/2013 0340    COAG Lab Results  Component Value Date   INR 1.09 01/13/2013   INR 1.03 04/16/2012   INR 1.07 05/17/2011   No results found for this basename: PTT    Physical Examination  BP Readings from Last 3 Encounters:  01/22/13 101/44  01/22/13 101/44  01/13/13 133/76   Temp Readings from Last 3 Encounters:  01/22/13 97.5 F (36.4 C) Oral  01/22/13 97.5 F (36.4 C) Oral  01/13/13 98 F (36.7 C) Oral   SpO2 Readings from Last 3 Encounters:  01/22/13 94%  01/22/13 94%  01/13/13 93%   Pulse Readings from Last 3 Encounters:  01/22/13 76  01/22/13 76  01/13/13 77    Pt is A&O x 3 right lower extremity: Incision/s is/are clean,dry.intact, and  healing without hematoma, erythema or drainage Limb is warm; with good color    Assessment/Plan: Pt. Doing well Post-op pain is controlled Wounds are healing well PT/OT for ambulation Continue wound care as ordered Will get social service consult  for possible SNF placement Thomasena Edis [facility]  01/22/2013 8:53 AM

## 2013-01-23 ENCOUNTER — Telehealth: Payer: Self-pay | Admitting: Vascular Surgery

## 2013-01-23 LAB — GLUCOSE, CAPILLARY
Glucose-Capillary: 77 mg/dL (ref 70–99)
Glucose-Capillary: 249 mg/dL — ABNORMAL HIGH (ref 70–99)

## 2013-01-23 NOTE — Accreditation Note (Signed)
Dr. Imogene Burn page awaiting return xall. Patient unable to void. Bladder scan 924 ml. Patient feels like she has to vd. but unable too abdo. distended. R.N. aware.

## 2013-01-23 NOTE — Progress Notes (Signed)
B;adder scan 924  Ml. In bladder. Patient attempted to vded on bsc. And was unable to vded. Will try again in one hour back to bed at 04:14. Patient wanted to wait another hour to see if she can vd.

## 2013-01-23 NOTE — Discharge Summary (Addendum)
[facility] Discharge Summary   Patient ID:  [name] MRN: [id number] DOB/AGE: 67-31-1947 67 y.o.  Admit date: 01/20/2013 Discharge date: 01/23/2013 Date of Surgery: 01/20/2013 Surgeon: Surgeon(s): Sherren Kerns, MD  Admission Diagnosis: Peripheral Vascular Disease  Discharge Diagnoses:  Peripheral Vascular Disease  Secondary Diagnoses: Past Medical History  Diagnosis Date  . Coronary artery disease     a. s/p CABG 2007 (L-RI, S-dRCA, S-LAD, S-OM);  b. myoview 6/13:  anteroapical MI, no ischemia, EF 31%  . Diabetes mellitus   . Hypertension   . Asthma   . Bronchitis   . Reflux   . Hyperlipidemia   . Chronic systolic heart failure     a. echo 6/13: mild LVH, EF 15-20%, diff HK, Gr 1 DD, mild reduced RVSF, PASP 32.  Marland Kitchen GERD (gastroesophageal reflux disease)   . Arthritis   . CKD (chronic kidney disease)   . Chronic back pain   . Hypoxia     a. Adm 09/2012 - discharged with home O2 in setting of CHF.  Marland Kitchen Abnormal stress test     a. Nuc 10/2011: bright target which may be in left breast or L chest wall or axilla. Pt reports she f/u with PCP and mammogram was OK (left breast cyst).  . Complication of anesthesia     slow to awaken  . 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 Anginal pain     "rarely, had some 2 months ago, not sure if heart or arm, cardiologist aware.  . CHF (congestive heart failure)   . Shortness of breath     at times- sensitive to perfume.  . Peripheral vascular disease   . Depression   . Stroke 05/2011    a. R ischemic CVA 05/2011. Right hand weakness.  . Pneumonia     2010ish  . Neuropathic pain     feet and hands  . Polio     as a child    Procedure(s): BYPASS GRAFT RIGHT FEMORAL-BELOW KNEE POPLITEAL ARTERY WITH RINGED GORTEX GRAFT  VEIN  PATCH ANGIOPLASTY  Discharged Condition: stable  HPI: Patient is a 66 y.o. year old female who presents for evaluation of right foot ischemia. The patient has complained of  right foot heaviness and a "dead sensation" and purplish color changes since having a fall 3 weeks ago. She also reports decreased range of motion. She denies prior history of claudication. However she does not walk much. She has fairly severe heart failure with her last ejection fraction being 15-20%. She is followed by Dr. not certain cardiology. There considering transferring her care to Dr. Gala Romney in the heart failure clinic. Other medical problems include coronary artery disease, diabetes, hypertension, asthma, bronchitis, renal dysfunction (creatinine 1.4 in May). The patient is on home oxygen at night.  01/20/2013 a right femoral to below knee popliteal bypass with Propaten, vein patch right popliteal artery was performed.  He incisions were healing well and doppler signals of PT are equal bilateral.  She is no easily aroused.  PT difficult secondary to alerted mental status.  She will need SNF placement for rehab care until she is more alert and able to perform ADL's.    A chest x-ray was ordered 01/22/2013 secondary to lung base crackles:  IMPRESSION:  Interval development of band-like consolidative opacity within the  right lower lung which may represent pneumonia, atelectasis or  sequelae of aspiration. Attention on follow-up.  Hospital Course:  [name] is  a 67 y.o. female is S/P Right Procedure(s): BYPASS GRAFT RIGHT FEMORAL-BELOW KNEE POPLITEAL ARTERY WITH RINGED GORTEX GRAFT  VEIN  PATCH ANGIOPLASTY Extubated: POD # 0 Physical exam: Doppler PT signals, skin warm to touch, superficial foot abrasions healing.   Post-op wounds clean, dry, intact or healing well Pt. Ambulating, voiding and taking PO diet without difficulty. Pt pain controlled with PO pain meds. Labs as below Complications:See hospital course.  Consults:     Significant Diagnostic Studies: CBC Lab Results  Component Value Date   WBC 9.5 01/21/2013   HGB 12.0 01/21/2013   HCT 36.4 01/21/2013   MCV 91.7  01/21/2013   PLT 113* 01/21/2013    BMET    Component Value Date/Time   NA 139 01/21/2013 0340   K 4.7 01/21/2013 0340   CL 105 01/21/2013 0340   CO2 26 01/21/2013 0340   GLUCOSE 128* 01/21/2013 0340   BUN 29* 01/21/2013 0340   CREATININE 1.42* 01/21/2013 0340   CALCIUM 9.0 01/21/2013 0340   GFRNONAA 38* 01/21/2013 0340   GFRAA 44* 01/21/2013 0340   COAG Lab Results  Component Value Date   INR 1.09 01/13/2013   INR 1.03 04/16/2012   INR 1.07 05/17/2011     Disposition:  Discharge to :Home with home health PT/OT Discharge Orders   Future Orders Complete By Expires   Call MD for:  redness, tenderness, or signs of infection (pain, swelling, bleeding, redness, odor or green/yellow discharge around incision site)  As directed    Call MD for:  severe or increased pain, loss or decreased feeling  in affected limb(s)  As directed    Call MD for:  temperature >100.5  As directed    Driving Restrictions  As directed    Comments:     No driving for 4 weeks   Increase activity slowly  As directed    Comments:     Walk with assistance use walker or cane as needed   Lifting restrictions  As directed    Comments:     No lifting for 6 weeks   May shower   As directed    Resume previous diet  As directed        Medication List         albuterol 108 (90 BASE) MCG/ACT inhaler  Commonly known as:  PROVENTIL HFA;VENTOLIN HFA  Inhale 2 puffs into the lungs every 6 (six) hours as needed for wheezing or shortness of breath.     ALPRAZolam 0.5 MG tablet  Commonly known as:  XANAX  Take 0.5 mg by mouth daily as needed for anxiety.     budesonide-formoterol 160-4.5 MCG/ACT inhaler  Commonly known as:  SYMBICORT  Inhale 2 puffs into the lungs 2 (two) times daily.     carvedilol 12.5 MG tablet  Commonly known as:  COREG  Take 12.5 mg by mouth every 12 (twelve) hours.     clopidogrel 75 MG tablet  Commonly known as:  PLAVIX  Take 75 mg by mouth daily.     diphenhydrAMINE 25 mg capsule   Commonly known as:  BENADRYL  Take 25 mg by mouth every 6 (six) hours as needed for allergies.     DULoxetine 60 MG capsule  Commonly known as:  CYMBALTA  Take 60 mg by mouth daily.     HYDROcodone-acetaminophen 5-325 MG per tablet  Commonly known as:  NORCO  Take 1-2 tablets every 6 hrs. / prn / pain     insulin  glargine 100 UNIT/ML injection  Commonly known as:  LANTUS  Inject 28 Units into the skin every morning.     insulin lispro 100 UNIT/ML injection  Commonly known as:  HUMALOG  Inject 6-11 Units into the skin 3 (three) times daily before meals. Per sliding scale     losartan 50 MG tablet  Commonly known as:  COZAAR  Take 1 tablet (50 mg total) by mouth daily.     nitroGLYCERIN 0.4 MG SL tablet  Commonly known as:  NITROSTAT  Place 0.4 mg under the tongue every 5 (five) minutes as needed for chest pain.     pantoprazole 40 MG tablet  Commonly known as:  PROTONIX  Take 40 mg by mouth daily.     potassium chloride 10 MEQ tablet  Commonly known as:  K-DUR  Take 10 mEq by mouth daily.     pregabalin 50 MG capsule  Commonly known as:  LYRICA  Take 50 mg by mouth 2 (two) times daily.     rosuvastatin 40 MG tablet  Commonly known as:  CRESTOR  Take 40 mg by mouth daily.     spironolactone 25 MG tablet  Commonly known as:  ALDACTONE  Take 25 mg by mouth daily.     torsemide 20 MG tablet  Commonly known as:  DEMADEX  Take 2 tablets (40 mg total) by mouth 2 (two) times daily.       Verbal and written Discharge instructions given to the patient. Wound care per Discharge AVS     Follow-up Information   Follow up with [name], MD. (Needs follow up on cheast x ray )    Specialty:  Specialist   Contact information:   [address] [place] [address] [phone]       Follow up with Sherren Kerns, MD In 4 weeks. (sent)    Specialty:  Vascular Surgery   Contact information:   [address] [place] [address] [phone]        Signed: [name] [facility] 01/23/2013, 8:25 AM  Addendum Note to D/C [name] is a 67 y.o. female who is S/P Procedure(s): BYPASS GRAFT RIGHT FEMORAL-BELOW KNEE POPLITEAL ARTERY WITH RINGED GORTEX GRAFT  VEIN  PATCH ANGIOPLASTY. The patient is stable and ready for discharge.  No changes to History, Physical Exam, Meds or D/C instructions  [name] 01/26/2013 7:56 AM    - For VQI Registry use --- Instructions: Press F2 to tab through selections.  Delete question if not applicable.   Post-op:  Wound infection: No  Graft infection: No  Transfusion: No  If yes, 0 units given New Arrhythmia: No Ipsilateral amputation: [x ] no, [ ]  Minor, [ ]  BKA, [ ]  AKA Discharge patency: [x ] Primary, [ ]  Primary assisted, [ ]  Secondary, [ ]  Occluded Patency judged by: [x ] Dopper only, [ ]  Palpable graft pulse, [ ]  Palpable distal pulse, [ ]  ABI inc. > 0.15, [ ]  Duplex Discharge ABI: R 0.97, L 0.97 D/C Ambulatory Status: Ambulatory with Assistance  Complications: MI: [x ] No, [ ]  Troponin only, [ ]  EKG or Clinical CHF: No Resp failure: [x ] none, [ ]  Pneumonia, [ ]  Ventilator Chg in renal function: [x ] none, [ ]  Inc. Cr > 0.5, [ ]  Temp. Dialysis, [ ]  Permanent dialysis Stroke: [x ] None, [ ]  Minor, [ ]  Major Return to OR: No  Reason for return to OR: [ ]  Bleeding, [ ]  Infection, [ ]   Thrombosis, [ ]  Revision  Discharge medications: Statin use:  Yes ASA use:  No  for medical reason   Plavix use:  Yes Beta blocker use: No  for medical reason   Coumadin use: No  for medical reason

## 2013-01-23 NOTE — Telephone Encounter (Signed)
Message copied by Jena Gauss on Fri Jan 23, 2013 11:37 AM ------      Message from: Melene Plan      Created: Fri Jan 23, 2013  9:20 AM                   ----- Message -----         From: Lars Mage, PA-C         Sent: 01/23/2013   8:20 AM           To: Melene Plan, RN            F/U in 4 weeks with Dr. Darrick Penna s/p by pass right LE ------

## 2013-01-23 NOTE — Progress Notes (Signed)
Clinical Social Work Department BRIEF PSYCHOSOCIAL ASSESSMENT 01/23/2013  Patient:  [name], [name]     Account Number:  [id number]     Admit date:  01/20/2013  Clinical Social Worker:  Carren Rang  Date/Time:  01/23/2013 11:18 AM  Referred by:  Physician  Date Referred:  01/22/2013 Referred for  SNF Placement   Other Referral:   Interview type:  Patient Other interview type:   CSW attempted to speak with patient who continuously nodded off. CSW asked if she could speak to family. Patient stated CSW can call her daughter [name].    PSYCHOSOCIAL DATA Living Status:  ALONE Admitted from facility:   Level of care:   Primary support name:  Mariell Nester [phone] Primary support relationship to patient:  CHILD, ADULT Degree of support available:   Good    CURRENT CONCERNS Current Concerns  Post-Acute Placement   Other Concerns:    SOCIAL WORK ASSESSMENT / PLAN CSW received referral for SNF placement. CSW introduced self and explained reason for visit. Patient continuously nodded off during conversation. CSW asked patient if she could call family and patient stated she can call her daughter, [name]. CSW attempted to reach daughter, left a voicemail and now awaiting a phone call back. CSW will complete FL2 for MD's signature. CSW will attempt to speak with patient and family again later today.   Assessment/plan status:  Psychosocial Support/Ongoing Assessment of Needs Other assessment/ plan:   Information/referral to community resources:   SNF information    PATIENT'S/FAMILY'S RESPONSE TO PLAN OF CARE: Patient reports that CSW can speak with daughter on behalf of the dc planning.       Maree Krabbe, MSW, [facility] [phone]

## 2013-01-23 NOTE — Progress Notes (Signed)
CSW attempted to speak with patient. Patient was asleep but woke up as CSW started to talk about placement options. Patient continued to nod off. CSW will attempt to speak to patient again later and will update when new information arises.  Maree Krabbe, MSW, [facility] [phone]

## 2013-01-23 NOTE — Progress Notes (Addendum)
[facility] [place]  Progress Note Bypass Surgery  Date of Surgery: 01/20/2013  Procedure(s): BYPASS GRAFT RIGHT FEMORAL-BELOW KNEE POPLITEAL ARTERY WITH RINGED GORTEX GRAFT  VEIN  PATCH ANGIOPLASTY Surgeon: Surgeon(s): Sherren Kerns, MD  3 Days Post-Op  History of Present Illness  [name] is a 67 y.o. female who is  3 Days Post-Op. The patient's pre-op symptoms of pain are Improved . Patients pain is well controlled.    VASC. LAB Studies:        ABI: Right 0.97;  Left 0.97;   Imaging: Dg Chest Port 1v Same Day  01/22/2013   *RADIOLOGY REPORT*  Clinical Data: Decreased oxygenation.  History of coronary artery disease, diabetes, hypertension, asthma.  PORTABLE CHEST - 1 VIEW SAME DAY  Comparison: Chest radiograph 01/13/2013  Findings: Stable enlarged cardiac and mediastinal contours status post median sternotomy and CABG procedure.  Calcification of the transverse thoracic aorta.  Interval development of linear consolidative opacity within the right lower lung.  No definite pleural effusion pneumothorax.  Regional skeleton is unremarkable.  IMPRESSION: Interval development of band-like consolidative opacity within the right lower lung which may represent pneumonia, atelectasis or sequelae of aspiration.  Attention on follow-up.   Original Report Authenticated By: Annia Belt, M.D    Significant Diagnostic Studies: CBC Lab Results  Component Value Date   WBC 9.5 01/21/2013   HGB 12.0 01/21/2013   HCT 36.4 01/21/2013   MCV 91.7 01/21/2013   PLT 113* 01/21/2013    BMET    Component Value Date/Time   NA 139 01/21/2013 0340   K 4.7 01/21/2013 0340   CL 105 01/21/2013 0340   CO2 26 01/21/2013 0340   GLUCOSE 128* 01/21/2013 0340   BUN 29* 01/21/2013 0340   CREATININE 1.42* 01/21/2013 0340   CALCIUM 9.0 01/21/2013 0340   GFRNONAA 38* 01/21/2013 0340   GFRAA 44* 01/21/2013 0340    COAG Lab Results  Component Value Date   INR 1.09 01/13/2013   INR 1.03  04/16/2012   INR 1.07 05/17/2011   No results found for this basename: PTT    Physical Examination  BP Readings from Last 3 Encounters:  01/23/13 100/69  01/23/13 100/69  01/13/13 133/76   Temp Readings from Last 3 Encounters:  01/23/13 98.1 F (36.7 C) Oral  01/23/13 98.1 F (36.7 C) Oral  01/13/13 98 F (36.7 C) Oral   SpO2 Readings from Last 3 Encounters:  01/23/13 97%  01/23/13 97%  01/13/13 93%   Pulse Readings from Last 3 Encounters:  01/23/13 72  01/23/13 72  01/13/13 77    Pt is A&O x 3 right lower extremity: Incision/s is/are clean,dry.intact, and  healing without hematoma, erythema or drainage Limb is warm; with good color PT doppler signals  Assessment/Plan: Pt. Doing well Post-op pain is controlled Wounds are healing well PT/OT for ambulation Continue wound care as ordered dry dressing daily for protection superficial wound.  [name] [facility]  01/23/2013 8:15 AM  History and exam as above Right foot warm  Incisions healing Having urinary retention. Awaiting SNF Stop IV fluid Remove foley in am  Fabienne Bruns, MD [facility] [place] Office: [phone] Pager: [phone]

## 2013-01-23 NOTE — Progress Notes (Signed)
Last time patient vded. on her own was at 15:00 today 500 ml. Patient still not vded since 15:00 abd. Distended. Bladder scan patient and it was >999 ml in bladder. Assist patient to bsc and patient vded 500 ml of urine. Assist back to bed and re scan patient and she still had >999 ml R.N. Aware and in and out cath done and got 900 ml of urine.

## 2013-01-23 NOTE — Progress Notes (Signed)
Daughter called CSW in regards to dc planning. Daughter stated that patient does not live alone, she lives with her granddaughter and grandson. Daughter explained to CSW that, "I know my mom and she would not want SNF." Daughter stated that patient has supervision at home bc her grandchildren do not have jobs and care for her at home. Daughter stated she was going to come up to the hospital later today to speak with patient, but was not wanting SNF. CSW asked daughter if she was sure she did not want SNF and daughter refused SNF but was interested in Home Health services. CSW referred this on to Case Manager who will follow up with daughter about Home Health. CSW signing off but will let Weekend CSW know of this situation. Please re consult CSW or contact weekend CSW if SNF is wanted at dc.  Maree Krabbe, MSW, Theresia Majors [phone]

## 2013-01-24 LAB — GLUCOSE, CAPILLARY
Glucose-Capillary: 153 mg/dL — ABNORMAL HIGH (ref 70–99)
Glucose-Capillary: 154 mg/dL — ABNORMAL HIGH (ref 70–99)

## 2013-01-24 NOTE — Progress Notes (Signed)
Pt without complaint says pain is less  Filed Vitals:   01/23/13 1930 01/23/13 2003 01/24/13 0505 01/24/13 0845  BP: 87/42 94/50 97/56    Pulse: 65  65   Temp: 98.1 F (36.7 C)  99.3 F (37.4 C)   TempSrc: Oral  Oral   Resp: 18  18   Height:      Weight:   160 lb 15 oz (73 kg)   SpO2: 96%  94% 94%    Right leg: incisions healing foot warm ulcers right foot clean   A: Pt overall very deconditioned but does not want to go to SNF Will continue to have her ambulate over the weekend Consider d/c Monday with Home health if family care and resources acceptable  P; see above  Fabienne Bruns, MD [facility] [place] Office: [phone] Pager: [phone]

## 2013-01-25 LAB — GLUCOSE, CAPILLARY: Glucose-Capillary: 159 mg/dL — ABNORMAL HIGH (ref 70–99)

## 2013-01-25 NOTE — Progress Notes (Signed)
[facility] [place]  Subjective  - less pain in leg, reluctant to go to SNF, wants home PT   Objective 113/68 68 98.9 F (37.2 C) (Oral) 18 96%  Intake/Output Summary (Last 24 hours) at 01/25/13 0931 Last data filed at 01/24/13 1500  Gross per 24 hour  Intake      0 ml  Output    650 ml  Net   -650 ml   Right leg incisions continue to heal Doppler right foot Right foot warm Ulcers unchanged  Assessment/Planning: S/p right fem pop Pt not really motivated, has only walked a few steps at a time since operation Ready for d/c from medical standpoint Will see what we can set up for her at home Plan d/c home tomorrow  Sherren Kerns 01/25/2013 9:31 AM --  Laboratory Lab Results: No results found for this basename: WBC, HGB, HCT, PLT,  in the last 72 hours BMET No results found for this basename: NA, K, CL, CO2, GLUCOSE, BUN, CREATININE, CALCIUM,  in the last 72 hours  COAG Lab Results  Component Value Date   INR 1.09 01/13/2013   INR 1.03 04/16/2012   INR 1.07 05/17/2011   No results found for this basename: PTT

## 2013-01-25 NOTE — Progress Notes (Signed)
Pt ambulated 15 feet with RW on room air. Patient tolerated well. Returned to bed via WC. Will continue to monitor. Fraser Din RN

## 2013-01-26 LAB — GLUCOSE, CAPILLARY
Glucose-Capillary: 160 mg/dL — ABNORMAL HIGH (ref 70–99)
Glucose-Capillary: 183 mg/dL — ABNORMAL HIGH (ref 70–99)

## 2013-01-26 MED ORDER — HYDROCODONE-ACETAMINOPHEN 5-325 MG PO TABS
ORAL_TABLET | ORAL | Status: DC
Start: 2013-01-26 — End: 2013-06-16

## 2013-01-26 NOTE — Progress Notes (Addendum)
[facility] [place]  Progress Note Bypass Surgery  Date of Surgery: 01/20/2013  Procedure(s):Right BYPASS GRAFT RIGHT FEMORAL-BELOW KNEE POPLITEAL ARTERY WITH RINGED GORTEX GRAFT  VEIN  PATCH ANGIOPLASTY Surgeon: Surgeon(s): Sherren Kerns, MD  6 Days Post-Op  History of Present Illness  [name] is a 67 y.o. female who is  6 Days Post-Op. The patient's pre-op symptoms of pain in the leg are Improved . Patients pain is well controlled.   Pt much more alert, awake- wants to go home  VASC. LAB Studies:        ABI: Right 0.97;  Left 0.97;  Physical Examination  BP Readings from Last 3 Encounters:  01/26/13 117/50  01/26/13 117/50  01/13/13 133/76   Temp Readings from Last 3 Encounters:  01/26/13 98 F (36.7 C) Oral  01/26/13 98 F (36.7 C) Oral  01/13/13 98 F (36.7 C) Oral   SpO2 Readings from Last 3 Encounters:  01/26/13 93%  01/26/13 93%  01/13/13 93%   Pulse Readings from Last 3 Encounters:  01/26/13 64  01/26/13 64  01/13/13 77    Pt is A&O x 3 right lower extremity: Incision/s is/are clean,dry.intact, and  healing without hematoma, erythema or drainage Limb is warm; with good color Mod edema right leg  Assessment/Plan: Pt. Doing well - much more awake and alert Post-op pain is controlled Wounds are healing well Home health PT/OT for ambulation Continue wound care as ordered  ROCZNIAK,REGINA J  01/26/2013 7:52 AM   Pt has now decided she wishes to pursue SNF.  Her daughter is going to look at [facility] today.  Lengthy discussion with pt that she needs to make a definite decision one way or the other.  She has been medically ready for discharge now for 4 days.  Disposition was set up to go home today but now family and patient have changed their mind.  On exam today  Her right foot is warm.  Her incisions are healing.  She is developing a new area of breakdown on her achilles due to her minimal participation in  activities.  We will continue to float her calf and encourage her to get out of bed.  Will involve social work again today for possible SNF  Fabienne Bruns, MD [facility] [place] Office: [phone] Pager: [phone]

## 2013-01-26 NOTE — Progress Notes (Signed)
01/26/13 Patient and family given AVS, discharge instructions, medication list, and paper prescritption given to patient. All questions were answered. patient 91% on RA will discharge home as ordered Freya Zobrist, Randall An RN

## 2013-01-26 NOTE — Progress Notes (Signed)
01/26/13 Patient informed nurse that she was going to be going home instead of going to SNF, Della Goo [facility] made aware and orders received to discharge home. Case management also aware Keona Sheffler, Randall An RN

## 2013-01-26 NOTE — Progress Notes (Signed)
Physical Therapy Treatment Patient Details Name: [name] MRN: [id number] DOB: November 19, 1945 Today's Date: 01/26/2013 Time: 8295-6213 PT Time Calculation (min): 38 min  PT Assessment / Plan / Recommendation  History of Present Illness Pt admit for right femoral to BK pop BPG.     PT Comments   Pt with improved mobility but only able to walk max 50' and will need 24hr supervision at home in addition to HHPT if family continues to deny SNF. Pt more alert and participative today but requires max cues for encouragement and initiation. Pt with extremely flexed posture with gait and improper use of RW making her a high fall risk. Pt encouraged to continue mobility with staff assist.    Follow Up Recommendations  Home health PT;Supervision/Assistance - 24 hour     Does the patient have the potential to tolerate intense rehabilitation     Barriers to Discharge        Equipment Recommendations  None recommended by PT    Recommendations for Other Services    Frequency     Progress towards PT Goals Progress towards PT goals: Progressing toward goals  Plan Discharge plan needs to be updated    Precautions / Restrictions Precautions Precautions: Fall Restrictions Weight Bearing Restrictions: No   Pertinent Vitals/Pain No pain VSS 3L throughout   Mobility  Bed Mobility Supine to Sit: 4: Min assist;HOB flat;With rails Sitting - Scoot to Edge of Bed: 5: Supervision Details for Bed Mobility Assistance: cueing for sequence with assist to roll and elevate trunk Transfers Sit to Stand: From chair/3-in-1;From bed;4: Min assist Stand to Sit: 4: Min assist;To chair/3-in-1 Details for Transfer Assistance: cueing for hand placement and safety x 3 trials Ambulation/Gait Ambulation/Gait Assistance: 4: Min assist Ambulation Distance (Feet): 40 Feet (40', 50', 15') Assistive device: Rolling walker Ambulation/Gait Assistance Details: cueing for posture, position in RW and safety. pt  maintains overly flexed posture with self posterior to RW Gait Pattern: Shuffle;Trunk flexed;Narrow base of support Gait velocity: decreased Stairs: Yes Stairs Assistance: 4: Min assist Stairs Assistance Details (indicate cue type and reason): cueing for sequence and safety with guarding and assist to maintain balance Stair Management Technique: One rail Right;One rail Left;Forwards;Step to pattern Number of Stairs: 3    Exercises General Exercises - Lower Extremity Long Arc Quad: AROM;10 reps;Both;Seated   PT Diagnosis:    PT Problem List:   PT Treatment Interventions:     PT Goals (current goals can now be found in the care plan section)    Visit Information  Last PT Received On: 01/26/13 Assistance Needed: +1 History of Present Illness: Pt admit for right femoral to BK pop BPG.      Subjective Data      Cognition  Cognition Arousal/Alertness: Awake/alert Behavior During Therapy: Flat affect Overall Cognitive Status: History of cognitive impairments - at baseline    Balance     End of Session PT - End of Session Equipment Utilized During Treatment: Gait belt;Oxygen Activity Tolerance: Patient tolerated treatment well Patient left: in chair;with call bell/phone within reach Nurse Communication: Mobility status   GP     Toney Sang [facility] 01/26/2013, 12:25 PM Delaney Meigs, PT [phone]

## 2013-01-29 ENCOUNTER — Ambulatory Visit (INDEPENDENT_AMBULATORY_CARE_PROVIDER_SITE_OTHER): Payer: Self-pay | Admitting: Vascular Surgery

## 2013-01-29 ENCOUNTER — Encounter: Payer: Self-pay | Admitting: Vascular Surgery

## 2013-01-29 ENCOUNTER — Telehealth: Payer: Self-pay

## 2013-01-29 VITALS — BP 111/60 | HR 94 | Temp 100.2°F | Resp 14 | Ht 60.0 in | Wt 159.0 lb

## 2013-01-29 DIAGNOSIS — I739 Peripheral vascular disease, unspecified: Secondary | ICD-10-CM

## 2013-01-29 DIAGNOSIS — M79609 Pain in unspecified limb: Secondary | ICD-10-CM

## 2013-01-29 DIAGNOSIS — M7989 Other specified soft tissue disorders: Secondary | ICD-10-CM

## 2013-01-29 MED ORDER — CEPHALEXIN 500 MG PO CAPS: 500.0000 mg | ORAL_CAPSULE | Freq: Three times a day (TID) | ORAL | Status: DC

## 2013-01-29 NOTE — Telephone Encounter (Signed)
Rec'd call from daughter.  Reports pt. Has redness, swelling and pus-like drainage from right groin incision.  Also reports bloody drainage from right knee incision; denies redness at this site.  Reports pt. C/o chills today.  Discussed with Dr. Darrick Penna; to bring pt. to office for evaluation today.  Advised daughter to bring pt. to office as soon as possible this afternoon, and she will be worked in.  Daughter verb. understanding.

## 2013-01-29 NOTE — Progress Notes (Signed)
Patient is a 67 year old female who recently underwent right femoral to below-knee popliteal bypass with propaten and a vein patch distally. This was done on September 16. She was discharged from the hospital 3 days ago. She began complaining of swelling in her right groin and some redness yesterday. She also started to have some drainage from her below-knee incision yesterday. She presented to the office today for further followup. She apparently had a low-grade temperature of 100.  Physical exam:  Filed Vitals:   01/29/13 1438  BP: 111/60  Pulse: 94  Temp: 100.2 F (37.9 C)  TempSrc: Oral  Resp: 14  Height: 5' (1.524 m)  Weight: 159 lb (72.122 kg)  SpO2: 92%    Right groin incision intact no drainage there is some surrounding skin erythema but this seems more like irritation from the Dermabond she is tender to palpation in this area.  Right below-knee incision some serous drainage from the inferior aspect. There is some edema in the leg. She has biphasic pedal Doppler signals. The patient has an eroded ulcerated area in her right Achilles that she developed in the hospital this has not eroded further but the patient's daughter states she still is not really active and has not really walking in her skilled nursing facility.  Assessment: Drainage right leg was some edema. This could just represent postoperative changes. However, it is concerning that she could have possible early graft infection.  Plan: Patient was placed on Keflex 500 mg 3 times a day today for a ten-day course. She'll followup in one week. If the wounds persists or deteriorates further she may require I&D. If the graft becomes infected she'll most likely need this removed. She would be at risk of limb loss at that point. She did not tolerate her bypass procedure very well this time. She has not been motivated to rehabilitate and has not been active participant in her rehabilitation I would not consider her a candidate for  further revascularization due to her overall debility. Hopefully the wounds in her right leg will heal without further event.  Fabienne Bruns, MD [facility] [place] Office: [phone] Pager: [phone]

## 2013-02-04 ENCOUNTER — Encounter: Payer: Self-pay | Admitting: Vascular Surgery

## 2013-02-05 ENCOUNTER — Ambulatory Visit (INDEPENDENT_AMBULATORY_CARE_PROVIDER_SITE_OTHER): Payer: Self-pay | Admitting: Vascular Surgery

## 2013-02-05 ENCOUNTER — Encounter: Payer: Self-pay | Admitting: Vascular Surgery

## 2013-02-05 ENCOUNTER — Other Ambulatory Visit: Payer: Self-pay

## 2013-02-05 VITALS — BP 92/55 | HR 79 | Temp 98.2°F | Ht 60.0 in | Wt 159.0 lb

## 2013-02-05 DIAGNOSIS — I739 Peripheral vascular disease, unspecified: Secondary | ICD-10-CM

## 2013-02-05 MED ORDER — VANCOMYCIN HCL IN DEXTROSE 1-5 GM/200ML-% IV SOLN
1000.0000 mg | INTRAVENOUS | Status: AC
  Administered 2013-02-06: 1000 mg via INTRAVENOUS
  Filled 2013-02-05: qty 200

## 2013-02-05 NOTE — Progress Notes (Signed)
I called patient numerous times and did not get an answer on phone.. There was no voice mail.

## 2013-02-05 NOTE — Progress Notes (Signed)
Patient is a 67 year old female who recently underwent right femoral to below-knee popliteal bypass with propaten and a vein patch distally. This was done on September 16. She was discharged from the hospital 10 days ago. She began complaining of swelling in her right groin and some redness last week. The below-knee incision has stopped draining at this point. She presented to the office today for further followup. She states overall swelling in her leg is improved. She is having some drainage from her right groin.  Physical exam:    Filed Vitals:   02/05/13 1008  BP: 92/55  Pulse: 79  Temp: 98.2 F (36.8 C)  TempSrc: Oral  Height: 5' (1.524 m)  Weight: 159 lb (72.122 kg)  SpO2: 96%    Right groin incision macerated with some necrotic superficial tissue she is tender to palpation in this area. She was unable to tolerate debridement of this in the office  Right below-knee incision is healing. There is some edema in the leg. She has biphasic pedal Doppler signals. The patient has an eroded ulcerated area in her right Achilles that is improved since last week.   Assessment: Drainage right leg was some edema Now with break down of her right groin incision   Plan: Debridement right groin in the operating room tomorrow.  We'll make a determination at that time a debridement whether or not she requires hospital admission.  Fabienne Bruns, MD [facility] [place] Office: [phone] Pager: [phone]

## 2013-02-06 ENCOUNTER — Encounter (HOSPITAL_COMMUNITY): Payer: Self-pay | Admitting: Anesthesiology

## 2013-02-06 ENCOUNTER — Encounter (HOSPITAL_COMMUNITY)
Admission: RE | Disposition: A | Discharge status: Home Health | Payer: Self-pay | Source: Ambulatory Visit | Attending: Vascular Surgery

## 2013-02-06 ENCOUNTER — Ambulatory Visit (HOSPITAL_COMMUNITY): Payer: Medicare Other | Admitting: Anesthesiology

## 2013-02-06 ENCOUNTER — Inpatient Hospital Stay (HOSPITAL_COMMUNITY)
Admission: RE | Admit: 2013-02-06 | Discharge: 2013-02-27 | DRG: 856 | Disposition: A | Discharge status: Home Health | Payer: Medicare Other | Source: Ambulatory Visit | Attending: Vascular Surgery | Admitting: Vascular Surgery

## 2013-02-06 DIAGNOSIS — E875 Hyperkalemia: Secondary | ICD-10-CM | POA: Diagnosis not present

## 2013-02-06 DIAGNOSIS — I959 Hypotension, unspecified: Secondary | ICD-10-CM | POA: Diagnosis present

## 2013-02-06 DIAGNOSIS — T8189XA Other complications of procedures, not elsewhere classified, initial encounter: Secondary | ICD-10-CM | POA: Diagnosis present

## 2013-02-06 DIAGNOSIS — F329 Major depressive disorder, single episode, unspecified: Secondary | ICD-10-CM | POA: Diagnosis present

## 2013-02-06 DIAGNOSIS — E870 Hyperosmolality and hypernatremia: Secondary | ICD-10-CM | POA: Diagnosis not present

## 2013-02-06 DIAGNOSIS — I739 Peripheral vascular disease, unspecified: Secondary | ICD-10-CM | POA: Diagnosis present

## 2013-02-06 DIAGNOSIS — B9689 Other specified bacterial agents as the cause of diseases classified elsewhere: Secondary | ICD-10-CM | POA: Diagnosis present

## 2013-02-06 DIAGNOSIS — I252 Old myocardial infarction: Secondary | ICD-10-CM

## 2013-02-06 DIAGNOSIS — J449 Chronic obstructive pulmonary disease, unspecified: Secondary | ICD-10-CM | POA: Diagnosis present

## 2013-02-06 DIAGNOSIS — E43 Unspecified severe protein-calorie malnutrition: Secondary | ICD-10-CM | POA: Diagnosis present

## 2013-02-06 DIAGNOSIS — D62 Acute posthemorrhagic anemia: Secondary | ICD-10-CM | POA: Diagnosis not present

## 2013-02-06 DIAGNOSIS — I509 Heart failure, unspecified: Secondary | ICD-10-CM | POA: Diagnosis present

## 2013-02-06 DIAGNOSIS — N189 Chronic kidney disease, unspecified: Secondary | ICD-10-CM | POA: Diagnosis present

## 2013-02-06 DIAGNOSIS — Z79899 Other long term (current) drug therapy: Secondary | ICD-10-CM

## 2013-02-06 DIAGNOSIS — R0902 Hypoxemia: Secondary | ICD-10-CM | POA: Diagnosis present

## 2013-02-06 DIAGNOSIS — K219 Gastro-esophageal reflux disease without esophagitis: Secondary | ICD-10-CM | POA: Diagnosis present

## 2013-02-06 DIAGNOSIS — Y832 Surgical operation with anastomosis, bypass or graft as the cause of abnormal reaction of the patient, or of later complication, without mention of misadventure at the time of the procedure: Secondary | ICD-10-CM | POA: Diagnosis present

## 2013-02-06 DIAGNOSIS — I129 Hypertensive chronic kidney disease with stage 1 through stage 4 chronic kidney disease, or unspecified chronic kidney disease: Secondary | ICD-10-CM | POA: Diagnosis present

## 2013-02-06 DIAGNOSIS — Z951 Presence of aortocoronary bypass graft: Secondary | ICD-10-CM

## 2013-02-06 DIAGNOSIS — E669 Obesity, unspecified: Secondary | ICD-10-CM | POA: Diagnosis present

## 2013-02-06 DIAGNOSIS — IMO0002 Reserved for concepts with insufficient information to code with codable children: Secondary | ICD-10-CM | POA: Diagnosis not present

## 2013-02-06 DIAGNOSIS — F19921 Other psychoactive substance use, unspecified with intoxication with delirium: Secondary | ICD-10-CM | POA: Diagnosis not present

## 2013-02-06 DIAGNOSIS — F3289 Other specified depressive episodes: Secondary | ICD-10-CM | POA: Diagnosis present

## 2013-02-06 DIAGNOSIS — N170 Acute kidney failure with tubular necrosis: Secondary | ICD-10-CM | POA: Diagnosis present

## 2013-02-06 DIAGNOSIS — J4489 Other specified chronic obstructive pulmonary disease: Secondary | ICD-10-CM | POA: Diagnosis present

## 2013-02-06 DIAGNOSIS — E872 Acidosis, unspecified: Secondary | ICD-10-CM | POA: Diagnosis present

## 2013-02-06 DIAGNOSIS — T8140XA Infection following a procedure, unspecified, initial encounter: Principal | ICD-10-CM | POA: Diagnosis present

## 2013-02-06 DIAGNOSIS — E785 Hyperlipidemia, unspecified: Secondary | ICD-10-CM | POA: Diagnosis present

## 2013-02-06 DIAGNOSIS — D72829 Elevated white blood cell count, unspecified: Secondary | ICD-10-CM | POA: Diagnosis present

## 2013-02-06 DIAGNOSIS — Z6831 Body mass index (BMI) 31.0-31.9, adult: Secondary | ICD-10-CM

## 2013-02-06 DIAGNOSIS — L97509 Non-pressure chronic ulcer of other part of unspecified foot with unspecified severity: Secondary | ICD-10-CM | POA: Diagnosis present

## 2013-02-06 DIAGNOSIS — E119 Type 2 diabetes mellitus without complications: Secondary | ICD-10-CM | POA: Diagnosis present

## 2013-02-06 DIAGNOSIS — Z87891 Personal history of nicotine dependence: Secondary | ICD-10-CM

## 2013-02-06 DIAGNOSIS — Z8673 Personal history of transient ischemic attack (TIA), and cerebral infarction without residual deficits: Secondary | ICD-10-CM

## 2013-02-06 DIAGNOSIS — G589 Mononeuropathy, unspecified: Secondary | ICD-10-CM | POA: Diagnosis present

## 2013-02-06 DIAGNOSIS — I5022 Chronic systolic (congestive) heart failure: Secondary | ICD-10-CM | POA: Diagnosis present

## 2013-02-06 DIAGNOSIS — T4275XA Adverse effect of unspecified antiepileptic and sedative-hypnotic drugs, initial encounter: Secondary | ICD-10-CM | POA: Diagnosis not present

## 2013-02-06 DIAGNOSIS — Z96649 Presence of unspecified artificial hip joint: Secondary | ICD-10-CM

## 2013-02-06 DIAGNOSIS — Z794 Long term (current) use of insulin: Secondary | ICD-10-CM

## 2013-02-06 DIAGNOSIS — I2510 Atherosclerotic heart disease of native coronary artery without angina pectoris: Secondary | ICD-10-CM | POA: Diagnosis present

## 2013-02-06 HISTORY — DX: Transient alteration of awareness: R40.4

## 2013-02-06 HISTORY — PX: I & D EXTREMITY: SHX5045

## 2013-02-06 LAB — CREATININE, SERUM
Creatinine, Ser: 4.24 mg/dL — ABNORMAL HIGH (ref 0.50–1.10)
GFR calc Af Amer: 12 mL/min — ABNORMAL LOW (ref >90)
GFR calc non Af Amer: 10 mL/min — ABNORMAL LOW (ref >90)

## 2013-02-06 LAB — POCT I-STAT 4, (NA,K, GLUC, HGB,HCT)
Glucose, Bld: 97 mg/dL (ref 70–99)
HCT: 39 % (ref 36.0–46.0)
Sodium: 137 mEq/L (ref 135–145)

## 2013-02-06 LAB — CBC
HCT: 33.6 % — ABNORMAL LOW (ref 36.0–46.0)
Hemoglobin: 11.6 g/dL — ABNORMAL LOW (ref 12.0–15.0)
MCHC: 34.5 g/dL (ref 30.0–36.0)
MCV: 88.4 fL (ref 78.0–100.0)
RDW: 14.4 % (ref 11.5–15.5)

## 2013-02-06 LAB — TROPONIN I
Troponin I: <0.3 ng/mL (ref <0.30)
Troponin I: <0.3 ng/mL (ref <0.30)

## 2013-02-06 LAB — GLUCOSE, CAPILLARY: Glucose-Capillary: 165 mg/dL — ABNORMAL HIGH (ref 70–99)

## 2013-02-06 SURGERY — IRRIGATION AND DEBRIDEMENT EXTREMITY
Anesthesia: General | Site: Groin | Laterality: Right | Wound class: Dirty or Infected

## 2013-02-06 MED ORDER — PHENYLEPHRINE HCL 10 MG/ML IJ SOLN
INTRAMUSCULAR | Status: DC | PRN
  Administered 2013-02-06: 80 ug via INTRAVENOUS
  Administered 2013-02-06 (×2): 120 ug via INTRAVENOUS
  Administered 2013-02-06: 80 ug via INTRAVENOUS

## 2013-02-06 MED ORDER — ALPRAZOLAM 0.5 MG PO TABS
0.5000 mg | ORAL_TABLET | Freq: Every day | ORAL | Status: DC | PRN
  Administered 2013-02-10: 0.5 mg via ORAL
  Filled 2013-02-06: qty 1

## 2013-02-06 MED ORDER — ONDANSETRON HCL 4 MG/2ML IJ SOLN
4.0000 mg | Freq: Four times a day (QID) | INTRAMUSCULAR | Status: DC | PRN
  Administered 2013-02-12 – 2013-02-13 (×2): 4 mg via INTRAVENOUS
  Filled 2013-02-06 (×2): qty 2

## 2013-02-06 MED ORDER — LABETALOL HCL 5 MG/ML IV SOLN
10.0000 mg | INTRAVENOUS | Status: DC | PRN
  Filled 2013-02-06: qty 4

## 2013-02-06 MED ORDER — SODIUM CHLORIDE 0.9 % IV SOLN
INTRAVENOUS | Status: DC
  Administered 2013-02-06: 75 mL/h via INTRAVENOUS
  Administered 2013-02-07 (×2): via INTRAVENOUS

## 2013-02-06 MED ORDER — NITROGLYCERIN 0.4 MG SL SUBL: 0.4000 mg | SUBLINGUAL_TABLET | SUBLINGUAL | Status: DC | PRN

## 2013-02-06 MED ORDER — POTASSIUM CHLORIDE ER 10 MEQ PO TBCR
10.0000 meq | EXTENDED_RELEASE_TABLET | Freq: Every day | ORAL | Status: DC
  Filled 2013-02-06 (×2): qty 1

## 2013-02-06 MED ORDER — ALBUMIN HUMAN 5 % IV SOLN
12.5000 g | Freq: Once | INTRAVENOUS | Status: AC
  Administered 2013-02-06: 12.5 g via INTRAVENOUS

## 2013-02-06 MED ORDER — BUDESONIDE-FORMOTEROL FUMARATE 160-4.5 MCG/ACT IN AERO
2.0000 | INHALATION_SPRAY | Freq: Two times a day (BID) | RESPIRATORY_TRACT | Status: DC
  Administered 2013-02-06 – 2013-02-27 (×35): 2 via RESPIRATORY_TRACT
  Filled 2013-02-06 (×4): qty 6

## 2013-02-06 MED ORDER — PROMETHAZINE HCL 25 MG/ML IJ SOLN: 6.2500 mg | INTRAMUSCULAR | Status: DC | PRN

## 2013-02-06 MED ORDER — SPIRONOLACTONE 25 MG PO TABS
25.0000 mg | ORAL_TABLET | Freq: Every day | ORAL | Status: DC
  Filled 2013-02-06: qty 1

## 2013-02-06 MED ORDER — PHENOL 1.4 % MT LIQD: 1.0000 | OROMUCOSAL | Status: DC | PRN

## 2013-02-06 MED ORDER — MORPHINE SULFATE 2 MG/ML IJ SOLN
2.0000 mg | INTRAMUSCULAR | Status: DC | PRN
Start: 2013-02-06 — End: 2013-02-07
  Administered 2013-02-07: 2 mg via INTRAVENOUS
  Filled 2013-02-06 (×2): qty 1

## 2013-02-06 MED ORDER — ONDANSETRON HCL 4 MG/2ML IJ SOLN
INTRAMUSCULAR | Status: DC | PRN
  Administered 2013-02-06: 4 mg via INTRAVENOUS

## 2013-02-06 MED ORDER — INSULIN ASPART 100 UNIT/ML ~~LOC~~ SOLN
0.0000 [IU] | Freq: Three times a day (TID) | SUBCUTANEOUS | Status: DC
  Administered 2013-02-09: 3 [IU] via SUBCUTANEOUS
  Administered 2013-02-09: 7 [IU] via SUBCUTANEOUS
  Administered 2013-02-09 – 2013-02-10 (×2): 5 [IU] via SUBCUTANEOUS
  Administered 2013-02-10: 2 [IU] via SUBCUTANEOUS
  Administered 2013-02-10: 5 [IU] via SUBCUTANEOUS
  Administered 2013-02-11: 3 [IU] via SUBCUTANEOUS
  Administered 2013-02-12 (×2): 7 [IU] via SUBCUTANEOUS
  Administered 2013-02-13: 3 [IU] via SUBCUTANEOUS
  Administered 2013-02-13: 5 [IU] via SUBCUTANEOUS
  Administered 2013-02-13: 3 [IU] via SUBCUTANEOUS

## 2013-02-06 MED ORDER — MIDAZOLAM HCL 2 MG/2ML IJ SOLN: 1.0000 mg | INTRAMUSCULAR | Status: DC | PRN

## 2013-02-06 MED ORDER — VANCOMYCIN HCL IN DEXTROSE 1-5 GM/200ML-% IV SOLN
1000.0000 mg | INTRAVENOUS | Status: DC
  Filled 2013-02-06: qty 200

## 2013-02-06 MED ORDER — ALBUMIN HUMAN 5 % IV SOLN
INTRAVENOUS | Status: AC
  Filled 2013-02-06: qty 250

## 2013-02-06 MED ORDER — DOCUSATE SODIUM 100 MG PO CAPS
100.0000 mg | ORAL_CAPSULE | Freq: Every day | ORAL | Status: DC
  Administered 2013-02-09 – 2013-02-27 (×14): 100 mg via ORAL
  Filled 2013-02-06 (×17): qty 1

## 2013-02-06 MED ORDER — PHENYLEPHRINE HCL 10 MG/ML IJ SOLN
10.0000 mg | INTRAVENOUS | Status: DC | PRN
  Administered 2013-02-06: 50 ug/min via INTRAVENOUS

## 2013-02-06 MED ORDER — PROPOFOL 10 MG/ML IV BOLUS
INTRAVENOUS | Status: DC | PRN
  Administered 2013-02-06: 80 mg via INTRAVENOUS

## 2013-02-06 MED ORDER — ACETAMINOPHEN 650 MG RE SUPP
325.0000 mg | RECTAL | Status: DC | PRN
Start: 2013-02-06 — End: 2013-02-23

## 2013-02-06 MED ORDER — SODIUM CHLORIDE 0.9 % IV SOLN: 500.0000 mL | Freq: Once | INTRAVENOUS | Status: AC | PRN

## 2013-02-06 MED ORDER — DIPHENHYDRAMINE HCL 25 MG PO CAPS: 25.0000 mg | ORAL_CAPSULE | Freq: Four times a day (QID) | ORAL | Status: DC | PRN

## 2013-02-06 MED ORDER — METOPROLOL TARTRATE 1 MG/ML IV SOLN: 2.0000 mg | INTRAVENOUS | Status: DC | PRN

## 2013-02-06 MED ORDER — SODIUM CHLORIDE 0.9 % IV SOLN
INTRAVENOUS | Status: DC
  Administered 2013-02-06: 09:00:00 via INTRAVENOUS

## 2013-02-06 MED ORDER — INSULIN GLARGINE 100 UNIT/ML ~~LOC~~ SOLN
28.0000 [IU] | Freq: Every day | SUBCUTANEOUS | Status: DC
  Filled 2013-02-06 (×2): qty 0.28

## 2013-02-06 MED ORDER — ACETAMINOPHEN 325 MG PO TABS
325.0000 mg | ORAL_TABLET | ORAL | Status: DC | PRN
  Administered 2013-02-09 – 2013-02-12 (×3): 650 mg via ORAL
  Administered 2013-02-14: 325 mg via ORAL
  Administered 2013-02-15 – 2013-02-19 (×3): 650 mg via ORAL
  Filled 2013-02-06 (×7): qty 2

## 2013-02-06 MED ORDER — ALUM & MAG HYDROXIDE-SIMETH 200-200-20 MG/5ML PO SUSP: 15.0000 mL | ORAL | Status: DC | PRN

## 2013-02-06 MED ORDER — ATORVASTATIN CALCIUM 20 MG PO TABS
20.0000 mg | ORAL_TABLET | Freq: Every day | ORAL | Status: DC
  Administered 2013-02-07 – 2013-02-15 (×5): 20 mg via ORAL
  Filled 2013-02-06 (×14): qty 1

## 2013-02-06 MED ORDER — PREGABALIN 50 MG PO CAPS
50.0000 mg | ORAL_CAPSULE | Freq: Two times a day (BID) | ORAL | Status: DC
  Administered 2013-02-07 (×2): 50 mg via ORAL
  Filled 2013-02-06 (×2): qty 1

## 2013-02-06 MED ORDER — HYDROCODONE-ACETAMINOPHEN 5-325 MG PO TABS: 1.0000 | ORAL_TABLET | Freq: Four times a day (QID) | ORAL | Status: DC | PRN

## 2013-02-06 MED ORDER — ASPIRIN 81 MG PO CHEW
81.0000 mg | CHEWABLE_TABLET | Freq: Every day | ORAL | Status: DC
  Administered 2013-02-07 – 2013-02-27 (×19): 81 mg via ORAL
  Filled 2013-02-06 (×18): qty 1

## 2013-02-06 MED ORDER — PANTOPRAZOLE SODIUM 40 MG PO TBEC
40.0000 mg | DELAYED_RELEASE_TABLET | Freq: Every day | ORAL | Status: DC
  Administered 2013-02-07 – 2013-02-12 (×4): 40 mg via ORAL
  Filled 2013-02-06 (×4): qty 1

## 2013-02-06 MED ORDER — DULOXETINE HCL 60 MG PO CPEP
60.0000 mg | ORAL_CAPSULE | Freq: Every day | ORAL | Status: DC
  Administered 2013-02-07 – 2013-02-12 (×4): 60 mg via ORAL
  Filled 2013-02-06 (×8): qty 1

## 2013-02-06 MED ORDER — CARVEDILOL 12.5 MG PO TABS
12.5000 mg | ORAL_TABLET | Freq: Once | ORAL | Status: AC
  Administered 2013-02-06: 12.5 mg via ORAL
  Filled 2013-02-06 (×2): qty 1

## 2013-02-06 MED ORDER — 0.9 % SODIUM CHLORIDE (POUR BTL) OPTIME
TOPICAL | Status: DC | PRN
  Administered 2013-02-06: 2000 mL

## 2013-02-06 MED ORDER — HYDRALAZINE HCL 20 MG/ML IJ SOLN
10.0000 mg | INTRAMUSCULAR | Status: DC | PRN
  Administered 2013-02-11: 10 mg via INTRAVENOUS
  Filled 2013-02-06: qty 1

## 2013-02-06 MED ORDER — FENTANYL CITRATE 0.05 MG/ML IJ SOLN
25.0000 ug | INTRAMUSCULAR | Status: DC | PRN
  Administered 2013-02-06: 25 ug via INTRAVENOUS

## 2013-02-06 MED ORDER — TORSEMIDE 20 MG PO TABS
40.0000 mg | ORAL_TABLET | Freq: Two times a day (BID) | ORAL | Status: DC
  Filled 2013-02-06 (×4): qty 2

## 2013-02-06 MED ORDER — LOSARTAN POTASSIUM 50 MG PO TABS
50.0000 mg | ORAL_TABLET | Freq: Every day | ORAL | Status: DC
  Filled 2013-02-06: qty 1

## 2013-02-06 MED ORDER — PIPERACILLIN-TAZOBACTAM 3.375 G IVPB
3.3750 g | Freq: Three times a day (TID) | INTRAVENOUS | Status: DC
  Administered 2013-02-06 – 2013-02-07 (×2): 3.375 g via INTRAVENOUS
  Filled 2013-02-06 (×3): qty 50

## 2013-02-06 MED ORDER — GUAIFENESIN-DM 100-10 MG/5ML PO SYRP: 15.0000 mL | ORAL_SOLUTION | ORAL | Status: DC | PRN

## 2013-02-06 MED ORDER — LIDOCAINE HCL (CARDIAC) 10 MG/ML IV SOLN
INTRAVENOUS | Status: DC | PRN
  Administered 2013-02-06: 80 mg via INTRAVENOUS

## 2013-02-06 MED ORDER — FENTANYL CITRATE 0.05 MG/ML IJ SOLN: 50.0000 ug | Freq: Once | INTRAMUSCULAR | Status: DC

## 2013-02-06 MED ORDER — SODIUM CHLORIDE 0.9 % IV SOLN
INTRAVENOUS | Status: DC | PRN
  Administered 2013-02-06: 11:00:00 via INTRAVENOUS

## 2013-02-06 MED ORDER — POTASSIUM CHLORIDE CRYS ER 20 MEQ PO TBCR: 20.0000 meq | EXTENDED_RELEASE_TABLET | Freq: Once | ORAL | Status: AC | PRN

## 2013-02-06 MED ORDER — FENTANYL CITRATE 0.05 MG/ML IJ SOLN
INTRAMUSCULAR | Status: AC
  Filled 2013-02-06: qty 2

## 2013-02-06 MED ORDER — FENTANYL CITRATE 0.05 MG/ML IJ SOLN
INTRAMUSCULAR | Status: DC | PRN
  Administered 2013-02-06: 50 ug via INTRAVENOUS

## 2013-02-06 MED ORDER — CARVEDILOL 12.5 MG PO TABS
12.5000 mg | ORAL_TABLET | Freq: Two times a day (BID) | ORAL | Status: DC
  Administered 2013-02-07: 12.5 mg via ORAL
  Filled 2013-02-06 (×5): qty 1

## 2013-02-06 MED ORDER — ENOXAPARIN SODIUM 30 MG/0.3ML ~~LOC~~ SOLN
30.0000 mg | SUBCUTANEOUS | Status: DC
  Administered 2013-02-07 – 2013-02-10 (×4): 30 mg via SUBCUTANEOUS
  Filled 2013-02-06 (×6): qty 0.3

## 2013-02-06 MED ORDER — ALBUTEROL SULFATE HFA 108 (90 BASE) MCG/ACT IN AERS
2.0000 | INHALATION_SPRAY | Freq: Four times a day (QID) | RESPIRATORY_TRACT | Status: DC | PRN
  Filled 2013-02-06: qty 6.7

## 2013-02-06 MED ORDER — DOPAMINE-DEXTROSE 3.2-5 MG/ML-% IV SOLN: 3.0000 ug/kg/min | INTRAVENOUS | Status: DC

## 2013-02-06 SURGICAL SUPPLY — 37 items
BANDAGE ELASTIC 4 VELCRO ST LF (GAUZE/BANDAGES/DRESSINGS) IMPLANT
BANDAGE ELASTIC 6 VELCRO ST LF (GAUZE/BANDAGES/DRESSINGS) IMPLANT
BANDAGE GAUZE ELAST BULKY 4 IN (GAUZE/BANDAGES/DRESSINGS) IMPLANT
CANISTER SUCTION 2500CC (MISCELLANEOUS) ×2 IMPLANT
CANISTER WOUND CARE 500ML ATS (WOUND CARE) ×1 IMPLANT
COVER SURGICAL LIGHT HANDLE (MISCELLANEOUS) ×2 IMPLANT
DRSG VAC ATS SM SENSATRAC (GAUZE/BANDAGES/DRESSINGS) ×1 IMPLANT
ELECT REM PT RETURN 9FT ADLT (ELECTROSURGICAL) ×2
ELECTRODE REM PT RTRN 9FT ADLT (ELECTROSURGICAL) ×1 IMPLANT
GLOVE BIO SURGEON STRL SZ7.5 (GLOVE) ×2 IMPLANT
GLOVE BIOGEL M 6.5 STRL (GLOVE) ×2 IMPLANT
GLOVE BIOGEL PI IND STRL 6.5 (GLOVE) IMPLANT
GLOVE BIOGEL PI IND STRL 7.0 (GLOVE) IMPLANT
GLOVE BIOGEL PI INDICATOR 6.5 (GLOVE) ×2
GLOVE BIOGEL PI INDICATOR 7.0 (GLOVE) ×2
GLOVE SS BIOGEL STRL SZ 7 (GLOVE) IMPLANT
GLOVE SUPERSENSE BIOGEL SZ 7 (GLOVE) ×1
GLOVE SURG SS PI 7.0 STRL IVOR (GLOVE) ×1 IMPLANT
GOWN PREVENTION PLUS XLARGE (GOWN DISPOSABLE) ×2 IMPLANT
GOWN STRL NON-REIN LRG LVL3 (GOWN DISPOSABLE) ×6 IMPLANT
GOWN STRL REIN XL XLG (GOWN DISPOSABLE) ×1 IMPLANT
KIT BASIN OR (CUSTOM PROCEDURE TRAY) ×2 IMPLANT
KIT ROOM TURNOVER OR (KITS) ×2 IMPLANT
NS IRRIG 1000ML POUR BTL (IV SOLUTION) ×2 IMPLANT
PACK GENERAL/GYN (CUSTOM PROCEDURE TRAY) IMPLANT
PACK UNIVERSAL I (CUSTOM PROCEDURE TRAY) IMPLANT
PAD ARMBOARD 7.5X6 YLW CONV (MISCELLANEOUS) ×4 IMPLANT
SPONGE GAUZE 4X4 12PLY (GAUZE/BANDAGES/DRESSINGS) ×2 IMPLANT
STAPLER VISISTAT 35W (STAPLE) IMPLANT
SUT ETHILON 3 0 PS 1 (SUTURE) IMPLANT
SUT VIC AB 2-0 CTX 36 (SUTURE) IMPLANT
SUT VIC AB 3-0 SH 27 (SUTURE)
SUT VIC AB 3-0 SH 27X BRD (SUTURE) IMPLANT
SUT VICRYL 4-0 PS2 18IN ABS (SUTURE) IMPLANT
TOWEL OR 17X24 6PK STRL BLUE (TOWEL DISPOSABLE) ×3 IMPLANT
TOWEL OR 17X26 10 PK STRL BLUE (TOWEL DISPOSABLE) ×2 IMPLANT
WATER STERILE IRR 1000ML POUR (IV SOLUTION) ×3 IMPLANT

## 2013-02-06 NOTE — H&P (View-Only) (Signed)
Patient is a 67-year-old female who recently underwent right femoral to below-knee popliteal bypass with propaten and a vein patch distally. This was done on September 16. She was discharged from the hospital 10 days ago. She began complaining of swelling in her right groin and some redness last week. The below-knee incision has stopped draining at this point. She presented to the office today for further followup. She states overall swelling in her leg is improved. She is having some drainage from her right groin.  Physical exam:    Filed Vitals:   02/05/13 1008  BP: 92/55  Pulse: 79  Temp: 98.2 F (36.8 C)  TempSrc: Oral  Height: 5' (1.524 m)  Weight: 159 lb (72.122 kg)  SpO2: 96%    Right groin incision macerated with some necrotic superficial tissue she is tender to palpation in this area. She was unable to tolerate debridement of this in the office  Right below-knee incision is healing. There is some edema in the leg. She has biphasic pedal Doppler signals. The patient has an eroded ulcerated area in her right Achilles that is improved since last week.   Assessment: Drainage right leg was some edema Now with break down of her right groin incision   Plan: Debridement right groin in the operating room tomorrow.  We'll make a determination at that time a debridement whether or not she requires hospital admission.  Charles Fields, MD [facility] [place] Office: [phone] Pager: [phone]  

## 2013-02-06 NOTE — Preoperative (Signed)
Beta Blockers   Reason not to administer Beta Blockers:Not Applicable 

## 2013-02-06 NOTE — Interval H&P Note (Signed)
History and Physical Interval Note:  02/06/2013 10:47 AM  [name]  has presented today for surgery, with the diagnosis of wound infection right groin  The various methods of treatment have been discussed with the patient and family. After consideration of risks, benefits and other options for treatment, the patient has consented to  Procedure(s): DEBRIDEMENT RIGHT GROIN WOUND (Right) as a surgical intervention .  The patient's history has been reviewed, patient examined, no change in status, stable for surgery.  I have reviewed the patient's chart and labs.  Questions were answered to the patient's satisfaction.     FIELDS,CHARLES E

## 2013-02-06 NOTE — Op Note (Signed)
Procedure: Debridement right groin placement of VAC dressing  Preoperative diagnosis: Poorly healing right groin wound  Postoperative diagnosis: Same  Anesthesia: Gen.  Assistant: Doreatha Massed PA-C  Operative findings: Fat necrosis with lymphatic drainage/purulent drainage, culture sent  Operative details: After obtaining informed consent, the patient was taken to the operating room. The patient was placed in supine position on the operating room table. After induction of general anesthesia and placement of a laryngeal mask the patient's right lower extremity was prepped and draped in the usual sterile fashion. Next the right groin was debrided sharply with a scalpel. All necrotic tissue was removed back to bleeding healthy surrounding tissue. The necrotic tissue extended several centimeters into the depth of the groin. However the graft was not completely exposed. There was some milky white fluid in the subcutaneous tissues. There were also some lymphatics. After the wound was fairly clean overall a VAC dressing was applied. The patient tolerated the procedure well and there were no complications. Interest sponge and needle count was correct in the case. The patient was taken to recover in stable condition.  Fabienne Bruns, MD [facility] [place] Office: [phone] Pager: [phone]

## 2013-02-06 NOTE — Progress Notes (Signed)
02/06/13 0833  OBSTRUCTIVE SLEEP APNEA  Have you ever been diagnosed with sleep apnea through a sleep study? No  Do you snore loudly (loud enough to be heard through closed doors)?  1  Do you often feel tired, fatigued, or sleepy during the daytime? 1  Has anyone observed you stop breathing during your sleep? 0  Do you have, or are you being treated for high blood pressure? 1  BMI more than 35 kg/m2? 0  Age over 67 years old? 1  Neck circumference greater than 40 cm/18 inches? 0  Gender: 0  Obstructive Sleep Apnea Score 4  Score 4 or greater  Results sent to PCP

## 2013-02-06 NOTE — Anesthesia Preprocedure Evaluation (Addendum)
Anesthesia Evaluation  Patient identified by MRN, date of birth, ID band Patient awake    Reviewed: Allergy & Precautions, H&P , NPO status , Patient's Chart, lab work & pertinent test results  Airway Mallampati: II TM Distance: <3 FB Neck ROM: Full    Dental  (+) Edentulous Upper, Partial Lower and Dental Advisory Given   Pulmonary shortness of breath, asthma , COPDformer smoker,  + rhonchi         Cardiovascular hypertension, Pt. on home beta blockers + angina + CAD, + Past MI, + CABG, + Peripheral Vascular Disease and +CHF Rhythm:Regular Rate:Normal     Neuro/Psych Depression CVA    GI/Hepatic GERD-  Medicated and Controlled,  Endo/Other  diabetes, Type 2, Insulin Dependent  Renal/GU Renal InsufficiencyRenal disease     Musculoskeletal   Abdominal (+) + obese,   Peds  Hematology   Anesthesia Other Findings Feels weak today Infected graft site  Reproductive/Obstetrics                         Anesthesia Physical Anesthesia Plan  ASA: IV  Anesthesia Plan: General   Post-op Pain Management:    Induction: Intravenous  Airway Management Planned: LMA  Additional Equipment:   Intra-op Plan:   Post-operative Plan: Extubation in OR  Informed Consent: I have reviewed the patients History and Physical, chart, labs and discussed the procedure including the risks, benefits and alternatives for the proposed anesthesia with the patient or authorized representative who has indicated his/her understanding and acceptance.     Plan Discussed with: CRNA and Surgeon  Anesthesia Plan Comments:         Anesthesia Quick Evaluation

## 2013-02-06 NOTE — Progress Notes (Signed)
I/O cath done using hospital protocol, received 650 cc's of cloudy amber urine.  Patient tolerated procedure without event.  Continue to watch.

## 2013-02-06 NOTE — Anesthesia Postprocedure Evaluation (Signed)
  Anesthesia Post-op Note  Patient: [name]  Procedure(s) Performed: Procedure(s): DEBRIDEMENT RIGHT GROIN WOUND & APPLICATION OF WOUND VAC. (Right)  Patient Location: PACU  Anesthesia Type:General  Level of Consciousness: awake  Airway and Oxygen Therapy: Patient Spontanous Breathing  Post-op Pain: mild  Post-op Assessment: Post-op Vital signs reviewed, Patient's Cardiovascular Status Stable, Respiratory Function Stable, Patent Airway, No signs of Nausea or vomiting and Pain level controlled  Post-op Vital Signs: Reviewed and stable  Complications: No apparent anesthesia complications

## 2013-02-06 NOTE — Progress Notes (Signed)
ANTIBIOTIC CONSULT NOTE - INITIAL  Pharmacy Consult for vancomycin / zosyn Indication: rule out sepsis  Allergies  Allergen Reactions  . Sulfa Antibiotics Anaphylaxis and Shortness Of Breath  . Codeine Nausea Only and Other (See Comments)    Severe headache  . Ivp Dye [Iodinated Diagnostic Agents] Hives and Other (See Comments)    Severe anxiety  . Penicillins Swelling and Other (See Comments)    "huge sores"    Patient Measurements: Height: 5' (152.4 cm) Weight: 159 lb (72.122 kg) IBW/kg (Calculated) : 45.5  Vital Signs: Temp: 94.6 F (34.8 C) (10/03 1632) Temp src: Axillary (10/03 1632) BP: 105/49 mmHg (10/03 1600) Pulse Rate: 71 (10/03 1600) Intake/Output from previous day:   Intake/Output from this shift: Total I/O In: 400 [I.V.:400] Out: -   Labs:  Recent Labs  02/06/13 0840  HGB 13.3   Estimated Creatinine Clearance: 34.5 ml/min (by C-G formula based on Cr of 1.42). No results found for this basename: VANCOTROUGH, Leodis Binet, VANCORANDOM, GENTTROUGH, GENTPEAK, GENTRANDOM, TOBRATROUGH, TOBRAPEAK, TOBRARND, AMIKACINPEAK, AMIKACINTROU, AMIKACIN,  in the last 72 hours   Microbiology: Recent Results (from the past 720 hour(s))  SURGICAL PCR SCREEN     Status: None   Collection Time    01/13/13  2:18 PM      Result Value Range Status   MRSA, PCR NEGATIVE  NEGATIVE Final   Staphylococcus aureus NEGATIVE  NEGATIVE Final   Comment:            The Xpert SA Assay (FDA     approved for NASAL specimens     in patients over 85 years of age),     is one component of     a comprehensive surveillance     program.  Test performance has     been validated by [employer] for patients greater     than or equal to 62 year old.     It is not intended     to diagnose infection nor to     guide or monitor treatment.    Medical History: Past Medical History  Diagnosis Date  . Coronary artery disease     a. s/p CABG 2007 (L-RI, S-dRCA, S-LAD, S-OM);  b. myoview  6/13:  anteroapical MI, no ischemia, EF 31%  . Diabetes mellitus   . Hypertension   . Asthma   . Bronchitis   . Reflux   . Hyperlipidemia   . Chronic systolic heart failure     a. echo 6/13: mild LVH, EF 15-20%, diff HK, Gr 1 DD, mild reduced RVSF, PASP 32.  Marland Kitchen GERD (gastroesophageal reflux disease)   . Arthritis   . CKD (chronic kidney disease)   . Chronic back pain   . Hypoxia     a. Adm 09/2012 - discharged with home O2 in setting of CHF.  Marland Kitchen Abnormal stress test     a. Nuc 10/2011: bright target which may be in left breast or L chest wall or axilla. Pt reports she f/u with PCP and mammogram was OK (left breast cyst).  . Complication of anesthesia     slow to awaken  . 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 Anginal pain     "rarely, had some 2 months ago, not sure if heart or arm, cardiologist aware.  . CHF (congestive heart failure)   . Shortness of breath     at times- sensitive to perfume.  . Peripheral vascular disease   .  Depression   . Stroke 05/2011    a. R ischemic CVA 05/2011. Right hand weakness.  . Pneumonia     2010ish  . Neuropathic pain     feet and hands  . Polio     as a child  . Altered level of consciousness     2 weeks now     Assessment: 66yof with wound infection right groin.  She went to OR today for debridment.   Cx in process.  She is afebrile 98.2, WBC WNL.  Cr 1.42 CrCl 59ml/min.  She received vancomycin 1gm preop today so will not require another dose until 10/4, she has hx pcn allergy but has tolerated keflex in past and ok for Zosyn per MD.     Goal of Therapy:  Vancomycin trough level 15-20 mcg/ml  Plan:  Zosyn 3.375GM IV q8 EI Vancomycin 1GM q24 - dose given preop today next dose due 10/4 1000  Leota Sauers Pharm.D. CPP, BCPS Clinical Pharmacist [phone] 02/06/2013 6:02 PM

## 2013-02-06 NOTE — Progress Notes (Signed)
Unable to void and has no urge to void. Bladder scan done with greater than 630 cc's. I/O cath to be done per protocol.  Continue to watch.

## 2013-02-06 NOTE — Progress Notes (Signed)
12 lead ekg obtained.

## 2013-02-06 NOTE — Progress Notes (Signed)
Pt did not take her Coreg this am.  Daughter states that she was afraid to give it, as her BP has been low.  Dr Gypsy Balsam called and informed of above and of vs.  Order place to give med.  Given as ordered.

## 2013-02-06 NOTE — Transfer of Care (Signed)
Immediate Anesthesia Transfer of Care Note  Patient: [name]  Procedure(s) Performed: Procedure(s): DEBRIDEMENT RIGHT GROIN WOUND & APPLICATION OF WOUND VAC. (Right)  Patient Location: PACU  Anesthesia Type:General  Level of Consciousness: awake and patient cooperative  Airway & Oxygen Therapy: Patient Spontanous Breathing and Patient connected to nasal cannula oxygen  Post-op Assessment: Report given to PACU RN and Post -op Vital signs reviewed and stable  Post vital signs: Reviewed  Complications: No apparent anesthesia complications

## 2013-02-07 LAB — GLUCOSE, CAPILLARY
Glucose-Capillary: 125 mg/dL — ABNORMAL HIGH (ref 70–99)
Glucose-Capillary: 119 mg/dL — ABNORMAL HIGH (ref 70–99)
Glucose-Capillary: 114 mg/dL — ABNORMAL HIGH (ref 70–99)

## 2013-02-07 LAB — BASIC METABOLIC PANEL
BUN: 97 mg/dL — ABNORMAL HIGH (ref 6–23)
CO2: 15 mEq/L — ABNORMAL LOW (ref 19–32)
Chloride: 104 mEq/L (ref 96–112)
Creatinine, Ser: 4.29 mg/dL — ABNORMAL HIGH (ref 0.50–1.10)
GFR calc Af Amer: 11 mL/min — ABNORMAL LOW (ref >90)
Glucose, Bld: 98 mg/dL (ref 70–99)
Potassium: 4.6 mEq/L (ref 3.5–5.1)

## 2013-02-07 LAB — CBC
HCT: 34.2 % — ABNORMAL LOW (ref 36.0–46.0)
Hemoglobin: 11.7 g/dL — ABNORMAL LOW (ref 12.0–15.0)
MCH: 30.2 pg (ref 26.0–34.0)
MCV: 88.4 fL (ref 78.0–100.0)
RBC: 3.87 MIL/uL (ref 3.87–5.11)
RDW: 14.3 % (ref 11.5–15.5)
WBC: 18.5 10*3/uL — ABNORMAL HIGH (ref 4.0–10.5)

## 2013-02-07 LAB — HEMOGLOBIN A1C: Mean Plasma Glucose: 206 mg/dL — ABNORMAL HIGH (ref <117)

## 2013-02-07 MED ORDER — PIPERACILLIN-TAZOBACTAM IN DEX 2-0.25 GM/50ML IV SOLN
2.2500 g | Freq: Three times a day (TID) | INTRAVENOUS | Status: DC
  Administered 2013-02-07 – 2013-02-13 (×17): 2.25 g via INTRAVENOUS
  Filled 2013-02-07 (×20): qty 50

## 2013-02-07 MED ORDER — ENSURE PUDDING PO PUDG
1.0000 | Freq: Three times a day (TID) | ORAL | Status: DC
  Administered 2013-02-09: 1 via ORAL

## 2013-02-07 MED ORDER — MORPHINE SULFATE 2 MG/ML IJ SOLN
0.5000 mg | INTRAMUSCULAR | Status: DC | PRN
  Administered 2013-02-07 (×2): 0.5 mg via INTRAVENOUS

## 2013-02-07 NOTE — Progress Notes (Addendum)
[facility] Progress Note  02/07/2013 8:26 AM 1 Day Post-Op  Subjective:  Lethargic this am, but arousable   Afebrile 80's-110's systolic HR 60's-90's regular 04% 5LO2NC  Filed Vitals:   02/07/13 0755  BP: 114/50  Pulse: 90  Temp:   Resp: 27    Physical Exam: Incisions:  Right groin with vac dressing with good seal   CBC    Component Value Date/Time   WBC 18.5* 02/07/2013 0120   WBC 9.2 12/19/2012 1601   RBC 3.87 02/07/2013 0120   RBC 4.66 12/19/2012 1601   HGB 11.7* 02/07/2013 0120   HGB 13.7 12/19/2012 1601   HCT 34.2* 02/07/2013 0120   HCT 42.9 12/19/2012 1601   PLT 189 02/07/2013 0120   MCV 88.4 02/07/2013 0120   MCV 92.0 12/19/2012 1601   MCH 30.2 02/07/2013 0120   MCH 29.4 12/19/2012 1601   MCHC 34.2 02/07/2013 0120   MCHC 31.9 12/19/2012 1601   RDW 14.3 02/07/2013 0120   LYMPHSABS 2.0 05/17/2011 1358   MONOABS 0.6 05/17/2011 1358   EOSABS 0.2 05/17/2011 1358   BASOSABS 0.0 05/17/2011 1358    BMET    Component Value Date/Time   NA 140 02/07/2013 0120   K 4.6 02/07/2013 0120   CL 104 02/07/2013 0120   CO2 15* 02/07/2013 0120   GLUCOSE 98 02/07/2013 0120   BUN 97* 02/07/2013 0120   CREATININE 4.29* 02/07/2013 0120   CALCIUM 8.2* 02/07/2013 0120   GFRNONAA 10* 02/07/2013 0120   GFRAA 11* 02/07/2013 0120    INR    Component Value Date/Time   INR 1.09 01/13/2013 1419     Intake/Output Summary (Last 24 hours) at 02/07/13 0826 Last data filed at 02/07/13 0600  Gross per 24 hour  Intake   1525 ml  Output   1350 ml  Net    175 ml     Assessment:  67 y.o. female is s/p:  Debridement right groin placement of VAC dressing  1 Day Post-Op  Plan: -ARF-significant increase in Cr from a couple of weeks ago.  Discontinue ARB, Demadex, and spironolactone.  -DVT prophylaxis:  Lovenox 30 mg -soft BP this am-hold coreg for this morning and resume this evening.  Continue IVF  -pt did have to have I&O cath over night x 2.  Will put in foley cath today for 24  hrs for strict UOP --if renal function does not improve, she will need a renal consult. -check labs in am -wound vac with good seal-probable vac change tomorrow -WBC trending upward-continue ABx-pt received Vancomycin pre-op and is being held at this time due to renal function and Zosyn dose decreased due to renal function. -wound culture from OR is pending -encourage po intake -mobilize - OOB to chair tid.   Doreatha Massed, PA-C [facility] [phone] 02/07/2013 8:26 AM    History and exam as above. Mental status similar to when I have seen her before.  She has a tendency to fall asleep mid sentence.    New worsening of chronic renal failure: meds stopped as listed above.  Will keep foley for now.  Had some retention last admission.  Urine output has been adequate but will leave foley for now in light of increased Creatinine.  If not improved with hydration and med changes over weekend will get Nephrology consult.  Most likely this is multifactorial dehydration, medication, infection pre renal ATN  Right groin continue VAC will change tomorrow to assess need for further  debridement vs removal of graft, continue antibiotics   PT OT needs to ambulate, was very resistant to this prior admission and developed decubitus right ankle.  Need to emphasize mobility  Fabienne Bruns, MD [facility] [place] Office: [phone] Pager: [phone]

## 2013-02-07 NOTE — Progress Notes (Signed)
ANTIBIOTIC CONSULT NOTE - FOLLOW UP  Pharmacy Consult for Vancomycin and Zosyn  Indication: groin wound infection  Allergies  Allergen Reactions  . Sulfa Antibiotics Anaphylaxis and Shortness Of Breath  . Codeine Nausea Only and Other (See Comments)    Severe headache  . Ivp Dye [Iodinated Diagnostic Agents] Hives and Other (See Comments)    Severe anxiety  . Penicillins Swelling and Other (See Comments)    "huge sores"    Patient Measurements: Height: 5' (152.4 cm) Weight: 159 lb (72.122 kg) IBW/kg (Calculated) : 45.5  Vital Signs: Temp: 98.4 F (36.9 C) (10/03 2300) Temp src: Oral (10/03 2300) BP: 109/48 mmHg (10/04 0000) Pulse Rate: 85 (10/04 0000) Intake/Output from previous day: 10/03 0701 - 10/04 0700 In: 1075 [I.V.:1075] Out: 1300 [Urine:1300] Intake/Output from this shift: Total I/O In: 375 [I.V.:375] Out: 0   Labs:  Recent Labs  02/06/13 0840 02/06/13 1959 02/07/13 0120  WBC  --  16.9* 18.5*  HGB 13.3 11.6* 11.7*  PLT  --  181 189  CREATININE  --  4.24* 4.29*   Estimated Creatinine Clearance: 11.4 ml/min (by C-G formula based on Cr of 4.29). No results found for this basename: VANCOTROUGH, VANCOPEAK, VANCORANDOM, GENTTROUGH, GENTPEAK, GENTRANDOM, TOBRATROUGH, TOBRAPEAK, TOBRARND, AMIKACINPEAK, AMIKACINTROU, AMIKACIN,  in the last 72 hours   Assessment: 67 yo female with infected groin wound s/p debridement, acute on chronic renal failure, for empiric antibiotics  Goal of Therapy:  Vancomycin trough level 10-15 mcg/ml  Plan:  Change Zosyn 2.25 g IV q8h Hold further vancomycin doses for now. F/U renal function  Shataya Winkles, Gary Fleet 02/07/2013,2:39 AM

## 2013-02-07 NOTE — Progress Notes (Signed)
Patient is lethargic and cannot follow instructions with MDI. RN notified

## 2013-02-07 NOTE — Evaluation (Signed)
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  Patient now admitted with rt groin wound, and is s/p I&D with VAC placed.  Clinical Impression  Patient presents with problems listed below.  Will benefit from acute PT to maximize independence prior to discharge.  Recommend ST-SNF at discharge for continued therapy.    PT Assessment  Patient needs continued PT services    Follow Up Recommendations  SNF    Does the patient have the potential to tolerate intense rehabilitation      Barriers to Discharge        Equipment Recommendations  None recommended by PT    Recommendations for Other Services     Frequency Min 3X/week    Precautions / Restrictions Precautions Precautions: Fall Restrictions Weight Bearing Restrictions: No   Pertinent Vitals/Pain Pain limiting mobility      Mobility  Bed Mobility Bed Mobility: Supine to Sit;Sitting - Scoot to Edge of Bed Supine to Sit: 1: +2 Total assist;HOB elevated Supine to Sit: Patient Percentage: 20% Sitting - Scoot to Edge of Bed: 3: Mod assist Details for Bed Mobility Assistance: Verbal and tactile cues for technique.  Assist to move bil. LE's off of bed, and to raise trunk to sitting.  Once in sitting, patient able to maintain balance with close supervisio. Transfers Transfers: Sit to Stand;Stand to Sit;Stand Pivot Transfers Sit to Stand: 1: +2 Total assist;With upper extremity assist;From bed Sit to Stand: Patient Percentage: 40% Stand to Sit: 1: +2 Total assist;With upper extremity assist;To chair/3-in-1 Stand to Sit: Patient Percentage: 40% Stand Pivot Transfers: 1: +2 Total assist Stand Pivot Transfers: Patient Percentage: 40% Details for Transfer Assistance:  Verbal and tactile cues for hand placement.  Patient attempted standing x3 with max cuing.  Patient yelling out.  On 3rd attempt, patient able to take 1-2 steps and pivoted to chair. Ambulation/Gait Ambulation/Gait Assistance: Not tested (comment)    Exercises     PT Diagnosis: Difficulty walking;Generalized weakness;Acute pain;Altered mental status  PT Problem List: Decreased strength;Decreased range of motion;Decreased activity tolerance;Decreased balance;Decreased mobility;Decreased cognition;Decreased knowledge of use of DME;Decreased safety awareness;Cardiopulmonary status limiting activity;Pain PT Treatment Interventions: DME instruction;Gait training;Functional mobility training;Balance training;Patient/family education     PT Goals(Current goals can be found in the care plan section) Acute Rehab PT Goals Patient Stated Goal: None stated PT Goal Formulation: With patient Time For Goal Achievement: 02/21/13 Potential to Achieve Goals: Fair  Visit Information  Last PT Received On: 02/07/13 Assistance Needed: +2 History of Present Illness: Patient is a 67 yo female who 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  Patient now admitted with rt groin wound, and is s/p I&D with VAC placed.       Prior Functioning  Home Living Family/patient expects to be discharged to:: Skilled nursing facility Living Arrangements: Other relatives Home Equipment: Gilmer Mor - single point;Walker - 2 wheels Prior Function Level of Independence: Needs assistance Gait / Transfers Assistance Needed: Using RW with min assist following bypass surgery Communication Communication: No difficulties Dominant Hand: Right    Cognition  Cognition Arousal/Alertness: Lethargic Behavior During Therapy: Anxious;Flat affect Overall Cognitive Status: No family/caregiver present to determine baseline cognitive functioning    Extremity/Trunk Assessment Upper Extremity Assessment Upper Extremity Assessment: Generalized  weakness  Lower Extremity Assessment Lower Extremity Assessment: RLE deficits/detail;Generalized weakness RLE Deficits / Details: Pain at wound site.  Decreased strength and ROM due to pain. RLE: Unable to fully assess due to pain   Balance Balance Balance Assessed: Yes Static Sitting Balance Static Sitting - Balance Support: Bilateral upper extremity supported;Feet supported Static Sitting - Level of Assistance: 5: Stand by assistance Static Sitting - Comment/# of Minutes: 5 Static Standing Balance Static Standing - Balance Support: Bilateral upper extremity supported Static Standing - Level of Assistance: 1: +2 Total assist Static Standing - Comment/# of Minutes: 30 seconds on 3 attempts.  Poor balance - unable to achieve fully upright position  End of Session PT - End of Session Equipment Utilized During Treatment: Gait belt;Oxygen Activity Tolerance: Patient limited by pain;Patient limited by lethargy Patient left: in chair;with call bell/phone within reach Nurse Communication: Mobility status  GP     Vena Austria 02/07/2013, 11:22 AM Durenda Hurt. Renaldo Fiddler, [facility] Acute Rehab Services Pager [phone]

## 2013-02-08 ENCOUNTER — Inpatient Hospital Stay (HOSPITAL_COMMUNITY): Payer: Medicare Other

## 2013-02-08 LAB — URINALYSIS, ROUTINE W REFLEX MICROSCOPIC
Glucose, UA: NEGATIVE mg/dL
Nitrite: NEGATIVE
Protein, ur: 100 mg/dL — AB
Specific Gravity, Urine: 1.019 (ref 1.005–1.030)
pH: 5 (ref 5.0–8.0)

## 2013-02-08 LAB — CBC
MCHC: 33.3 g/dL (ref 30.0–36.0)
MCV: 90.1 fL (ref 78.0–100.0)
Platelets: 173 10*3/uL (ref 150–400)
RDW: 14.8 % (ref 11.5–15.5)
WBC: 18.3 10*3/uL — ABNORMAL HIGH (ref 4.0–10.5)

## 2013-02-08 LAB — BASIC METABOLIC PANEL
Chloride: 105 mEq/L (ref 96–112)
Creatinine, Ser: 4.73 mg/dL — ABNORMAL HIGH (ref 0.50–1.10)
GFR calc Af Amer: 10 mL/min — ABNORMAL LOW (ref >90)
GFR calc non Af Amer: 9 mL/min — ABNORMAL LOW (ref >90)
Potassium: 4.9 mEq/L (ref 3.5–5.1)

## 2013-02-08 LAB — GLUCOSE, CAPILLARY
Glucose-Capillary: 130 mg/dL — ABNORMAL HIGH (ref 70–99)
Glucose-Capillary: 212 mg/dL — ABNORMAL HIGH (ref 70–99)
Glucose-Capillary: 223 mg/dL — ABNORMAL HIGH (ref 70–99)

## 2013-02-08 LAB — URINE MICROSCOPIC-ADD ON

## 2013-02-08 LAB — SODIUM, URINE, RANDOM: Sodium, Ur: 30 mEq/L

## 2013-02-08 MED ORDER — INSULIN GLARGINE 100 UNIT/ML ~~LOC~~ SOLN
28.0000 [IU] | Freq: Every day | SUBCUTANEOUS | Status: DC
  Administered 2013-02-10 – 2013-02-13 (×3): 28 [IU] via SUBCUTANEOUS
  Filled 2013-02-08 (×6): qty 0.28

## 2013-02-08 MED ORDER — JEVITY 1.2 CAL PO LIQD
1000.0000 mL | ORAL | Status: DC
  Administered 2013-02-08: 1000 mL
  Filled 2013-02-08 (×5): qty 1000

## 2013-02-08 MED ORDER — VANCOMYCIN HCL IN DEXTROSE 1-5 GM/200ML-% IV SOLN
1000.0000 mg | Freq: Once | INTRAVENOUS | Status: AC
  Administered 2013-02-08: 1000 mg via INTRAVENOUS
  Filled 2013-02-08: qty 200

## 2013-02-08 MED ORDER — SODIUM BICARBONATE 8.4 % IV SOLN
INTRAVENOUS | Status: DC
  Administered 2013-02-08 (×2): via INTRAVENOUS
  Filled 2013-02-08 (×9): qty 150

## 2013-02-08 NOTE — Progress Notes (Signed)
Nursing Panda advanced per radiology recommendation, recheck KUB.  Panda in correct position to start TF.  Will remove stylet and begin TF as ordered.

## 2013-02-08 NOTE — Progress Notes (Signed)
Brief Nutrition Note  Consult received for enteral/tube feeding initiation and management.  Adult Enteral Nutrition Protocol initiated. Full assessment to follow.  Admitting Dx: nonhealing wound right groin  Body mass index is 31.05 kg/(m^2). Pt meets criteria for obesity, unspecified based on current BMI.  Labs:   Recent Labs Lab 02/06/13 0840 02/06/13 1959 02/07/13 0120 02/08/13 0525  NA 137  --  140 140  K 3.9  --  4.6 4.9  CL  --   --  104 105  CO2  --   --  15* 17*  BUN  --   --  97* 95*  CREATININE  --  4.24* 4.29* 4.73*  CALCIUM  --   --  8.2* 8.4  GLUCOSE 97  --  98 143*    Linnell Fulling, RD, LDN Pager #: [phone] After-Hours Pager #: [phone]

## 2013-02-08 NOTE — Progress Notes (Signed)
Dr. Hyman Hopes updated on patient's urine output; will continue to monitor.  Vivi Martens RN

## 2013-02-08 NOTE — Consult Note (Signed)
Referring Provider: No ref. provider found Primary Care Physician:  [name], MD Primary Nephrologist:    Reason for Consultation:  Acute oliguric renal failure HPI: Patient is a 67 year old female who recently underwent right femoral to below-knee popliteal bypass with propaten and a vein patch distally. This was done on September 16. . She began complaining of swelling in her right groin and some redness last week. The below-knee incision has stopped draining at this point. She presented to the office 10/2 . She states overall swelling in her leg is improved. She is having some drainage from her right groin she underwent debridement right groin 10/3. Baseline creatinine 1.5 in September.   Creatinine 4.24       Urine output    1700             10/3          4.29                                   635             10/4          4.73                                   100              10/5  Vancomycin  9.8        Past Medical History  Diagnosis Date  . Coronary artery disease     a. s/p CABG 2007 (L-RI, S-dRCA, S-LAD, S-OM);  b. myoview 6/13:  anteroapical MI, no ischemia, EF 31%  . Diabetes mellitus   . Hypertension   . Asthma   . Bronchitis   . Reflux   . Hyperlipidemia   . Chronic systolic heart failure     a. echo 6/13: mild LVH, EF 15-20%, diff HK, Gr 1 DD, mild reduced RVSF, PASP 32.  Marland Kitchen GERD (gastroesophageal reflux disease)   . Arthritis   . CKD (chronic kidney disease)   . Chronic back pain   . Hypoxia     a. Adm 09/2012 - discharged with home O2 in setting of CHF.  Marland Kitchen Abnormal stress test     a. Nuc 10/2011: bright target which may be in left breast or L chest wall or axilla. Pt reports she f/u with PCP and mammogram was OK (left breast cyst).  . Complication of anesthesia     slow to awaken  . 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 Anginal pain     "rarely, had some 2 months ago, not sure if heart or arm, cardiologist aware.  . CHF (congestive heart  failure)   . Shortness of breath     at times- sensitive to perfume.  . Peripheral vascular disease   . Depression   . Stroke 05/2011    a. R ischemic CVA 05/2011. Right hand weakness.  . Pneumonia     2010ish  . Neuropathic pain     feet and hands  . Polio     as a child  . Altered level of consciousness     2 weeks now    Past Surgical History  Procedure Laterality Date  . Coronary artery bypass graft      LIMA to RI, SVG to distal  RCA, SVG to LAD, SVG to OM. 2007  . Joint replacement      Right hip replacement   . Debridement skin / sq / muscle of trunk      s/p staph infection from CABG 2007  . Cystectomy      Subcutaneous  . Partial hip arthroplasty    . Stroke    . Femoral-popliteal bypass graft Right 01/20/2013    Procedure: BYPASS GRAFT RIGHT FEMORAL-BELOW KNEE POPLITEAL ARTERY WITH RINGED GORTEX GRAFT ;  Surgeon: Sherren Kerns, MD;  Location: [facility] OR;  Service: Vascular;  Laterality: Right;  . Patch angioplasty Right 01/20/2013    Procedure: VEIN  PATCH ANGIOPLASTY;  Surgeon: Sherren Kerns, MD;  Location: [facility] OR;  Service: Vascular;  Laterality: Right;    Prior to Admission medications   Medication Sig Start Date End Date Taking? Authorizing Provider  albuterol (PROVENTIL HFA;VENTOLIN HFA) 108 (90 BASE) MCG/ACT inhaler Inhale 2 puffs into the lungs every 6 (six) hours as needed for wheezing or shortness of breath.    Yes Historical Provider, MD  ALPRAZolam Prudy Feeler) 0.5 MG tablet Take 0.5 mg by mouth daily as needed for anxiety.    Yes Historical Provider, MD  budesonide-formoterol (SYMBICORT) 160-4.5 MCG/ACT inhaler Inhale 2 puffs into the lungs 2 (two) times daily.    Yes Historical Provider, MD  carvedilol (COREG) 12.5 MG tablet Take 12.5 mg by mouth 2 (two) times daily with a meal.  04/17/12 04/17/13 Yes Rhetta Mura, MD  cephALEXin (KEFLEX) 500 MG capsule Take 1 capsule (500 mg total) by mouth 3 (three) times daily. 01/29/13  Yes Sherren Kerns, MD   clopidogrel (PLAVIX) 75 MG tablet Take 75 mg by mouth daily.   Yes Historical Provider, MD  diphenhydrAMINE (BENADRYL) 25 mg capsule Take 25 mg by mouth every 6 (six) hours as needed for allergies.    Yes Historical Provider, MD  DULoxetine (CYMBALTA) 60 MG capsule Take 60 mg by mouth daily.   Yes Historical Provider, MD  HYDROcodone-acetaminophen (NORCO) 5-325 MG per tablet Take 1-2 tablets every 6 hrs. / prn / pain 01/26/13  Yes Regina J Roczniak, PA-C  insulin glargine (LANTUS) 100 UNIT/ML injection Inject 28 Units into the skin every morning.    Yes Historical Provider, MD  insulin lispro (HUMALOG) 100 UNIT/ML injection Inject 6-11 Units into the skin 3 (three) times daily before meals. Per sliding scale   Yes Historical Provider, MD  losartan (COZAAR) 50 MG tablet Take 1 tablet (50 mg total) by mouth daily. 09/29/12  Yes Beatrice Lecher, PA-C  nitroGLYCERIN (NITROSTAT) 0.4 MG SL tablet Place 0.4 mg under the tongue every 5 (five) minutes as needed for chest pain.    Yes Historical Provider, MD  pantoprazole (PROTONIX) 40 MG tablet Take 40 mg by mouth daily.   Yes Historical Provider, MD  potassium chloride (K-DUR) 10 MEQ tablet Take 10 mEq by mouth daily.   Yes Historical Provider, MD  pregabalin (LYRICA) 50 MG capsule Take 50 mg by mouth 2 (two) times daily.   Yes Historical Provider, MD  rosuvastatin (CRESTOR) 40 MG tablet Take 40 mg by mouth daily.   Yes Historical Provider, MD  spironolactone (ALDACTONE) 25 MG tablet Take 25 mg by mouth daily. 09/29/12  Yes Scott T Alben Spittle, PA-C  torsemide (DEMADEX) 20 MG tablet Take 2 tablets (40 mg total) by mouth 2 (two) times daily. 09/29/12  Yes Beatrice Lecher, PA-C    Current Facility-Administered Medications  Medication Dose Route Frequency  Provider Last Rate Last Dose  . acetaminophen (TYLENOL) tablet 325-650 mg  325-650 mg Oral Q4H PRN Samantha J Rhyne, PA-C       Or  . acetaminophen (TYLENOL) suppository 325-650 mg  325-650 mg Rectal Q4H PRN  Samantha J Rhyne, PA-C      . albuterol (PROVENTIL HFA;VENTOLIN HFA) 108 (90 BASE) MCG/ACT inhaler 2 puff  2 puff Inhalation Q6H PRN Samantha J Rhyne, PA-C      . ALPRAZolam Prudy Feeler) tablet 0.5 mg  0.5 mg Oral Daily PRN Ames Coupe Rhyne, PA-C      . alum & mag hydroxide-simeth (MAALOX/MYLANTA) 200-200-20 MG/5ML suspension 15-30 mL  15-30 mL Oral Q2H PRN Samantha J Rhyne, PA-C      . aspirin chewable tablet 81 mg  81 mg Oral Daily Samantha J Rhyne, PA-C   81 mg at 02/07/13 0930  . atorvastatin (LIPITOR) tablet 20 mg  20 mg Oral q1800 Samantha J Rhyne, PA-C   20 mg at 02/07/13 1832  . budesonide-formoterol (SYMBICORT) 160-4.5 MCG/ACT inhaler 2 puff  2 puff Inhalation BID Ames Coupe Rhyne, PA-C   2 puff at 02/07/13 1938  . docusate sodium (COLACE) capsule 100 mg  100 mg Oral Daily Samantha J Rhyne, PA-C      . DOPamine (INTROPIN) 800 mg in dextrose 5 % 250 mL infusion  3-5 mcg/kg/min Intravenous Continuous Samantha J Rhyne, PA-C      . DULoxetine (CYMBALTA) DR capsule 60 mg  60 mg Oral Daily Ames Coupe Rhyne, PA-C   60 mg at 02/07/13 1478  . enoxaparin (LOVENOX) injection 30 mg  30 mg Subcutaneous Q24H Samantha J Rhyne, PA-C   30 mg at 02/08/13 1238  . feeding supplement (ENSURE) pudding 1 Container  1 Container Oral TID WC Samantha J Rhyne, PA-C      . guaiFENesin-dextromethorphan (ROBITUSSIN DM) 100-10 MG/5ML syrup 15 mL  15 mL Oral Q4H PRN Samantha J Rhyne, PA-C      . hydrALAZINE (APRESOLINE) injection 10 mg  10 mg Intravenous Q2H PRN Samantha J Rhyne, PA-C      . insulin aspart (novoLOG) injection 0-9 Units  0-9 Units Subcutaneous TID WC Samantha J Rhyne, PA-C      . insulin glargine (LANTUS) injection 28 Units  28 Units Subcutaneous Daily Samantha J Rhyne, PA-C      . labetalol (NORMODYNE,TRANDATE) injection 10 mg  10 mg Intravenous Q2H PRN Samantha J Rhyne, PA-C      . metoprolol (LOPRESSOR) injection 2-5 mg  2-5 mg Intravenous Q2H PRN Samantha J Rhyne, PA-C      . nitroGLYCERIN (NITROSTAT) SL  tablet 0.4 mg  0.4 mg Sublingual Q5 min PRN Samantha J Rhyne, PA-C      . ondansetron (ZOFRAN) injection 4 mg  4 mg Intravenous Q6H PRN Samantha J Rhyne, PA-C      . pantoprazole (PROTONIX) EC tablet 40 mg  40 mg Oral Daily Samantha J Rhyne, PA-C   40 mg at 02/07/13 0930  . phenol (CHLORASEPTIC) mouth spray 1 spray  1 spray Mouth/Throat PRN Samantha J Rhyne, PA-C      . piperacillin-tazobactam (ZOSYN) IVPB 2.25 g  2.25 g Intravenous Q8H Sherren Kerns, MD   2.25 g at 02/08/13 1238  . sodium bicarbonate 150 mEq in dextrose 5 % 1,000 mL infusion   Intravenous Continuous Garnetta Buddy, MD 125 mL/hr at 02/08/13 1016    . vancomycin (VANCOCIN) IVPB 1000 mg/200 mL premix  1,000 mg Intravenous Once [employer],  RPH        Allergies as of 02/05/2013 - Review Complete 02/05/2013  Allergen Reaction Noted  . Sulfa antibiotics Anaphylaxis and Shortness Of Breath 05/17/2011  . Codeine Nausea Only and Other (See Comments) 05/17/2011  . Ivp dye [iodinated diagnostic agents] Hives and Other (See Comments) 10/20/2011  . Penicillins Swelling and Other (See Comments) 05/17/2011    Family History  Problem Relation Age of Onset  . Coronary artery disease Father 40    Unclear if it was an MI  . Stroke Mother 29  . Coronary artery disease Brother 73  . Coronary artery disease Brother 56    History   Social History  . Marital Status: Divorced    Spouse Name: N/A    Number of Children: 4  . Years of Education: N/A   Occupational History  . Retired  [employer]    [employer]   Social History Main Topics  . Smoking status: Former Smoker -- 0.00 packs/day for 50 years    Types: Cigarettes    Quit date: 01/06/2013  . Smokeless tobacco: Never Used  . Alcohol Use: No  . Drug Use: No  . Sexual Activity: Not on file   Other Topics Concern  . Not on file   Social History Narrative   Lives with grandson 50 years old.    Review of Systems: Not easily obtained due to patient's lethargic  state  Physical Exam: Vital signs in last 24 hours: Temp:  [97.4 F (36.3 C)-98.7 F (37.1 C)] 98.5 F (36.9 C) (10/05 1134) Pulse Rate:  [76-84] 84 (10/05 0349) Resp:  [20-26] 22 (10/05 0349) BP: (91-116)/(49-52) 93/52 mmHg (10/05 0349) SpO2:  [93 %-96 %] 93 % (10/05 0349) Last BM Date: 02/06/13 General:   Chronically ill appearing Head:  Normocephalic and atraumatic. Eyes:  Sclera clear, no icterus.   Conjunctiva pale Ears:  Normal auditory acuity. Nose:  No deformity, discharge,  or lesions. Mouth:  No deformity or lesions, dentition normal. Neck:  Supple; no masses or thyromegaly. JVP not elevated Lungs:  Clear throughout to auscultation.   No wheezes, crackles, or rhonchi. No acute distress. Heart:  Regular rate and rhythm; no murmurs, clicks, rubs,  or gallops. Abdomen:  Soft, nontender and nondistended. No masses, hepatosplenomegaly or hernias noted. Normal bowel sounds, without guarding, and without rebound.   Msk:  Symmetrical without gross deformities. Normal posture. Pulses:  No carotid, renal, femoral bruits.  Extremities:  Without clubbing or edema. Neurologic:  Lethargic but attempts to answer questions Skin:  Intact without significant lesions or rashes. Cervical Nodes:  No significant cervical adenopathy. Psych:  Alert and cooperative. Normal mood and affect.  Intake/Output from previous day: 10/04 0701 - 10/05 0700 In: 2705 [P.O.:480; I.V.:1725; IV Piggyback:500] Out: 635 [Urine:575; Drains:60] Intake/Output this shift: Total I/O In: -  Out: 100 [Urine:100]  Lab Results:  Recent Labs  02/06/13 1959 02/07/13 0120 02/08/13 0525  WBC 16.9* 18.5* 18.3*  HGB 11.6* 11.7* 10.9*  HCT 33.6* 34.2* 32.7*  PLT 181 189 173   BMET  Recent Labs  02/06/13 0840 02/06/13 1959 02/07/13 0120 02/08/13 0525  NA 137  --  140 140  K 3.9  --  4.6 4.9  CL  --   --  104 105  CO2  --   --  15* 17*  GLUCOSE 97  --  98 143*  BUN  --   --  97* 95*  CREATININE  --   4.24* 4.29* 4.73*  CALCIUM  --   --  8.2* 8.4   LFT No results found for this basename: PROT, ALBUMIN, AST, ALT, ALKPHOS, BILITOT, BILIDIR, IBILI,  in the last 72 hours PT/INR No results found for this basename: LABPROT, INR,  in the last 72 hours Hepatitis Panel No results found for this basename: HEPBSAG, HCVAB, HEPAIGM, HEPBIGM,  in the last 72 hours  Studies/Results: No results found.  Assessment/Plan:  Acute renal Failure   - I suspect ATN ischemic due to hypotension   -renal ultrasound   -check CPK r/o rhabdomyolysis   -urine sodium  HTN/Volume   -low blood pressure               - IV hydration   -may need pressors  Metabolic Acidosis   -mild    -IV bicarbonate at 125 cc/ hr  Electrolytes   -stable  Will  Follow , patient may need dialysis. I have stopped narcotics and lyrica to see if mental status improves        LOS: 2 Ajna Moors W @TODAY @2 :01 PM

## 2013-02-08 NOTE — Progress Notes (Signed)
ANTIBIOTIC CONSULT NOTE - Follow Up  Pharmacy Consult for vancomycin / zosyn Indication: rule out sepsis  Allergies  Allergen Reactions  . Sulfa Antibiotics Anaphylaxis and Shortness Of Breath  . Codeine Nausea Only and Other (See Comments)    Severe headache  . Ivp Dye [Iodinated Diagnostic Agents] Hives and Other (See Comments)    Severe anxiety  . Penicillins Swelling and Other (See Comments)    "huge sores"    Patient Measurements: Height: 5' (152.4 cm) Weight: 159 lb (72.122 kg) IBW/kg (Calculated) : 45.5  Vital Signs: Temp: 98.5 F (36.9 C) (10/05 1134) Temp src: Oral (10/05 1134) BP: 93/52 mmHg (10/05 0349) Pulse Rate: 84 (10/05 0349) Intake/Output from previous day: 10/04 0701 - 10/05 0700 In: 2705 [P.O.:480; I.V.:1725; IV Piggyback:500] Out: 635 [Urine:575; Drains:60] Intake/Output from this shift: Total I/O In: -  Out: 100 [Urine:100]  Labs:  Recent Labs  02/06/13 1959 02/07/13 0120 02/08/13 0525  WBC 16.9* 18.5* 18.3*  HGB 11.6* 11.7* 10.9*  PLT 181 189 173  CREATININE 4.24* 4.29* 4.73*   Estimated Creatinine Clearance: 10.4 ml/min (by C-G formula based on Cr of 4.73).  Recent Labs  02/08/13 1140  VANCORANDOM 9.8     Microbiology: Recent Results (from the past 720 hour(s))  SURGICAL PCR SCREEN     Status: None   Collection Time    01/13/13  2:18 PM      Result Value Range Status   MRSA, PCR NEGATIVE  NEGATIVE Final   Staphylococcus aureus NEGATIVE  NEGATIVE Final   Comment:            The Xpert SA Assay (FDA     approved for NASAL specimens     in patients over 51 years of age),     is one component of     a comprehensive surveillance     program.  Test performance has     been validated by [employer] for patients greater     than or equal to 28 year old.     It is not intended     to diagnose infection nor to     guide or monitor treatment.  WOUND CULTURE     Status: None   Collection Time    02/06/13 11:31 AM   Result Value Range Status   Specimen Description WOUND RIGHT GROIN   Final   Special Requests PT ON VANCOMYCIN   Final   Gram Stain     Final   Value: NO WBC SEEN     NO SQUAMOUS EPITHELIAL CELLS SEEN     MODERATE GRAM NEGATIVE RODS     Performed at [facility]   Culture     Final   Value: MODERATE GRAM NEGATIVE RODS     Performed at [facility]   Report Status PENDING   Incomplete    Medical History: Past Medical History  Diagnosis Date  . Coronary artery disease     a. s/p CABG 2007 (L-RI, S-dRCA, S-LAD, S-OM);  b. myoview 6/13:  anteroapical MI, no ischemia, EF 31%  . Diabetes mellitus   . Hypertension   . Asthma   . Bronchitis   . Reflux   . Hyperlipidemia   . Chronic systolic heart failure     a. echo 6/13: mild LVH, EF 15-20%, diff HK, Gr 1 DD, mild reduced RVSF, PASP 32.  Marland Kitchen GERD (gastroesophageal reflux disease)   . Arthritis   . CKD (  chronic kidney disease)   . Chronic back pain   . Hypoxia     a. Adm 09/2012 - discharged with home O2 in setting of CHF.  Marland Kitchen Abnormal stress test     a. Nuc 10/2011: bright target which may be in left breast or L chest wall or axilla. Pt reports she f/u with PCP and mammogram was OK (left breast cyst).  . Complication of anesthesia     slow to awaken  . 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 Anginal pain     "rarely, had some 2 months ago, not sure if heart or arm, cardiologist aware.  . CHF (congestive heart failure)   . Shortness of breath     at times- sensitive to perfume.  . Peripheral vascular disease   . Depression   . Stroke 05/2011    a. R ischemic CVA 05/2011. Right hand weakness.  . Pneumonia     2010ish  . Neuropathic pain     feet and hands  . Polio     as a child  . Altered level of consciousness     2 weeks now     Assessment: 66yof with wound infection right groin, s/p debridement on 10/3.   Cx in process.  She is afebrile 98.2, WBC 18.3    With her current renal function, would  anticipate q48 hours dosing. However as her SCr continues to rise, we are dosing intermittently based on her renal function trend and vancomycin levels.   Her SCr today has increased to 4.73 (4.24 > 4.29 > 4.73). (SCr from 01/2013 was 1.42). CrCl ~10.4 ml/min. She last received vancomycin on 10/3.   Ms. Geis is also being treated with zosyn (hx pcn allergy but has tolerated keflex in past and ok for Zosyn per MD).  Wound culture 10/3 >> gram negative rods (though patient had received vancomycin prior to culture)  10/5 Vanc random: 9.8- - level is subtherapeutic. Will redose today.  Goal of Therapy:  Vancomycin trough level 15-20 mcg/ml  Plan:  -Continue Zosyn 2.25 GM IV q8  -Give vancomycin 1 g IV x 1 dose.  -Continue to monitor renal function and dose vancomycin accordingly -Monitor cultures, clinical progression  Precious Segall C. Olie Dibert, PharmD Clinical Pharmacist-Resident Pager: [phone] Pharmacy: [phone] 02/08/2013 1:17 PM

## 2013-02-08 NOTE — Progress Notes (Signed)
Son came to see patient; was not sure about starting tube feeds and wanted [place] to wait until he spoke with Dr. Darrick Penna; after speaking with Dr. Darrick Penna, he agreed to tube feeds.  Will continue to monitor.  Vivi Martens RN

## 2013-02-08 NOTE — Progress Notes (Addendum)
[facility] Progress Note  02/08/2013 7:02 AM 2 Days Post-Op  Subjective:  Sleeping-awakes to voice and light  Afebrile HR 70's-90's 90's-110's systolic 93% 2LO2NC  Filed Vitals:   02/08/13 0349  BP: 93/52  Pulse: 84  Temp: 97.5 F (36.4 C)  Resp: 22    Physical Exam: Incisions:  Right groin with minimal granulation at the base; fibrinous tissue throughout; no purulent material; odorous.  Extremities:  + doppler signal right PT  CBC    Component Value Date/Time   WBC 18.3* 02/08/2013 0525   WBC 9.2 12/19/2012 1601   RBC 3.63* 02/08/2013 0525   RBC 4.66 12/19/2012 1601   HGB 10.9* 02/08/2013 0525   HGB 13.7 12/19/2012 1601   HCT 32.7* 02/08/2013 0525   HCT 42.9 12/19/2012 1601   PLT 173 02/08/2013 0525   MCV 90.1 02/08/2013 0525   MCV 92.0 12/19/2012 1601   MCH 30.0 02/08/2013 0525   MCH 29.4 12/19/2012 1601   MCHC 33.3 02/08/2013 0525   MCHC 31.9 12/19/2012 1601   RDW 14.8 02/08/2013 0525   LYMPHSABS 2.0 05/17/2011 1358   MONOABS 0.6 05/17/2011 1358   EOSABS 0.2 05/17/2011 1358   BASOSABS 0.0 05/17/2011 1358    BMET    Component Value Date/Time   NA 140 02/08/2013 0525   K 4.9 02/08/2013 0525   CL 105 02/08/2013 0525   CO2 17* 02/08/2013 0525   GLUCOSE 143* 02/08/2013 0525   BUN 95* 02/08/2013 0525   CREATININE 4.73* 02/08/2013 0525   CALCIUM 8.4 02/08/2013 0525   GFRNONAA 9* 02/08/2013 0525   GFRAA 10* 02/08/2013 0525    INR    Component Value Date/Time   INR 1.09 01/13/2013 1419     Intake/Output Summary (Last 24 hours) at 02/08/13 1610 Last data filed at 02/08/13 0600  Gross per 24 hour  Intake   2705 ml  Output    635 ml  Net   2070 ml   Wound Culture 02/06/13: Gram Stain  NO WBC SEEN NO SQUAMOUS EPITHELIAL CELLS SEEN MODERATE GRAM NEGATIVE RODS Performed at [facility]   Culture reincubated for better growth     Assessment:  67 y.o. female is s/p:  Debridement right groin placement of VAC dressing   2 Days  Post-Op  Plan: -vac taken down this am-minimal granulation tissue at base.  No purulent material.  Gram stain from wound in OR 02/06/13 reveals moderate GNR.  Continue Zosyn/Vanc per pharmacy until culture and sensitivities are back.  Pt afebrile, but WBC still increased. -will replace vac and place on a T/T/S change.  Will check wound on Tuesday and if it looks as if she needs more debridement, will schedule her for the OR on Wed -renal function continues to decline with decreasing UOP.  Will get a renal consult (spoke to Dr. Hyman Hopes). -minimal po intake-will place NGT for TF per nutrition to optimize wound healing -DM-will get DM coordinator to see pt to optimize glucose control.  -DVT prophylaxis:  Lovenox 30mg  daily -mobilize and OOB to chair    Doreatha Massed, PA-C [facility] [phone] 02/08/2013 7:02 AM  Right groin wound with some granulation at base.  No exposed graft.  Wound is overall clean but pale.  Pt is not eating.  Will add supplemental tube feeds.  Acute worsening of chronic renal failure.  Dr Hyman Hopes to see today. Cultures showing gram neg rods continue Zosyn for now.  Right foot still perfused patent bypass.  Ruta Hinds, MD [facility] [place] Office: [phone] Pager: [phone]

## 2013-02-09 ENCOUNTER — Inpatient Hospital Stay (HOSPITAL_COMMUNITY): Payer: Medicare Other

## 2013-02-09 LAB — GLUCOSE, CAPILLARY
Glucose-Capillary: 277 mg/dL — ABNORMAL HIGH (ref 70–99)
Glucose-Capillary: 313 mg/dL — ABNORMAL HIGH (ref 70–99)
Glucose-Capillary: 284 mg/dL — ABNORMAL HIGH (ref 70–99)
Glucose-Capillary: 218 mg/dL — ABNORMAL HIGH (ref 70–99)
Glucose-Capillary: 168 mg/dL — ABNORMAL HIGH (ref 70–99)

## 2013-02-09 LAB — BASIC METABOLIC PANEL
BUN: 90 mg/dL — ABNORMAL HIGH (ref 6–23)
CO2: 28 mEq/L (ref 19–32)
Chloride: 96 mEq/L (ref 96–112)
GFR calc Af Amer: 11 mL/min — ABNORMAL LOW (ref >90)
GFR calc non Af Amer: 9 mL/min — ABNORMAL LOW (ref >90)
Glucose, Bld: 311 mg/dL — ABNORMAL HIGH (ref 70–99)
Potassium: 4 mEq/L (ref 3.5–5.1)
Sodium: 137 mEq/L (ref 135–145)

## 2013-02-09 LAB — WOUND CULTURE: Gram Stain: NONE SEEN

## 2013-02-09 MED ORDER — JEVITY 1.2 CAL PO LIQD
1000.0000 mL | ORAL | Status: DC
  Administered 2013-02-10: 1000 mL
  Administered 2013-02-12 – 2013-02-14 (×3)
  Administered 2013-02-15: 1000 mL
  Filled 2013-02-09 (×11): qty 1000

## 2013-02-09 MED ORDER — PRO-STAT SUGAR FREE PO LIQD
30.0000 mL | Freq: Three times a day (TID) | ORAL | Status: DC
  Administered 2013-02-09 – 2013-02-16 (×17): 30 mL
  Filled 2013-02-09 (×24): qty 30

## 2013-02-09 MED ORDER — ENSURE PUDDING PO PUDG
1.0000 | Freq: Three times a day (TID) | ORAL | Status: DC
  Administered 2013-02-09 – 2013-02-16 (×9): 1 via ORAL

## 2013-02-09 NOTE — Progress Notes (Signed)
Admit: 02/06/2013 LOS: 3  76F w/ PVD s/p Fem-Pop bypass 9/16 c/b by wound infection s/p OR debriedment 10/3 and known CKD (BL SCr 1.5) with AKI likely ATN  Subjective:  SCr stable/slightly improved in past 24h.   Had renal US, no obstruction.    10/05 0701 - 10/06 0700 In: 2802.1 [I.V.:2252.1; NG/GT:450; IV Piggyback:100] Out: 695 [Urine:625; Emesis/NG output:20; Drains:50]  Filed Weights   02/06/13 1700 02/09/13 0400  Weight: 72.122 kg (159 lb) 73.3 kg (161 lb 9.6 oz)    Current meds: reviewed including NaHCO3 @ 125/hr Current Labs: reviewed   Physical Exam:  Blood pressure 127/63, pulse 73, temperature 97.3 F (36.3 C), temperature source Oral, resp. rate 34, height 5' (1.524 m), weight 73.3 kg (161 lb 9.6 oz), SpO2 97.00%. NAD ENT: NGT in place EYES: EOMI CV:RRR, nl s1s2 LUNGS: scattered wheezing, nl wob.   EXT: no LEE ABD: soft, nontender SKIN bandages noted.  Assessment/Plan 1. AoCKD: Likely ATN.  Atheroembolic disease is possible, will add differential to CBC and C3, C4 levels tomorrow AM labs.  Electrolytes stable.   2. Acidosis: improved on NaHCO3 gtt, given concern for fluid retention will reduce to 78mL/hr.  Now on enteral nutrition    Sabra Heck MD 02/09/2013, 2:08 PM   Recent Labs Lab 02/07/13 0120 02/08/13 0525 02/09/13 0820  NA 140 140 137  K 4.6 4.9 4.0  CL 104 105 96  CO2 15* 17* 28  GLUCOSE 98 143* 311*  BUN 97* 95* 90*  CREATININE 4.29* 4.73* 4.52*  CALCIUM 8.2* 8.4 7.9*    Recent Labs Lab 02/06/13 1959 02/07/13 0120 02/08/13 0525  WBC 16.9* 18.5* 18.3*  HGB 11.6* 11.7* 10.9*  HCT 33.6* 34.2* 32.7*  MCV 88.4 88.4 90.1  PLT 181 189 173    Current Facility-Administered Medications  Medication Dose Route Frequency Provider Last Rate Last Dose  . acetaminophen (TYLENOL) tablet 325-650 mg  325-650 mg Oral Q4H PRN Ames Coupe Rhyne, PA-C   650 mg at 02/09/13 0831   Or  . acetaminophen (TYLENOL) suppository 325-650 mg  325-650 mg  Rectal Q4H PRN Samantha J Rhyne, PA-C      . albuterol (PROVENTIL HFA;VENTOLIN HFA) 108 (90 BASE) MCG/ACT inhaler 2 puff  2 puff Inhalation Q6H PRN Samantha J Rhyne, PA-C      . ALPRAZolam Prudy Feeler) tablet 0.5 mg  0.5 mg Oral Daily PRN Ames Coupe Rhyne, PA-C      . alum & mag hydroxide-simeth (MAALOX/MYLANTA) 200-200-20 MG/5ML suspension 15-30 mL  15-30 mL Oral Q2H PRN Samantha J Rhyne, PA-C      . aspirin chewable tablet 81 mg  81 mg Oral Daily Samantha J Rhyne, PA-C   81 mg at 02/07/13 0930  . atorvastatin (LIPITOR) tablet 20 mg  20 mg Oral q1800 Samantha J Rhyne, PA-C   20 mg at 02/07/13 1832  . budesonide-formoterol (SYMBICORT) 160-4.5 MCG/ACT inhaler 2 puff  2 puff Inhalation BID Ames Coupe Rhyne, PA-C   2 puff at 02/09/13 0830  . docusate sodium (COLACE) capsule 100 mg  100 mg Oral Daily Samantha J Rhyne, PA-C      . DOPamine (INTROPIN) 800 mg in dextrose 5 % 250 mL infusion  3-5 mcg/kg/min Intravenous Continuous Samantha J Rhyne, PA-C      . DULoxetine (CYMBALTA) DR capsule 60 mg  60 mg Oral Daily Ames Coupe Rhyne, PA-C   60 mg at 02/07/13 1610  . enoxaparin (LOVENOX) injection 30 mg  30 mg Subcutaneous Q24H Lelon Mast J  Rhyne, PA-C   30 mg at 02/09/13 1118  . feeding supplement (ENSURE) pudding 1 Container  1 Container Oral TID BM Ailene Ards, RD      . feeding supplement (JEVITY 1.2 CAL) liquid 1,000 mL  1,000 mL Per Tube Continuous Ailene Ards, RD      . feeding supplement (PRO-STAT SUGAR FREE 64) liquid 30 mL  30 mL Per Tube TID Ailene Ards, RD      . guaiFENesin-dextromethorphan (ROBITUSSIN DM) 100-10 MG/5ML syrup 15 mL  15 mL Oral Q4H PRN Samantha J Rhyne, PA-C      . hydrALAZINE (APRESOLINE) injection 10 mg  10 mg Intravenous Q2H PRN Samantha J Rhyne, PA-C      . insulin aspart (novoLOG) injection 0-9 Units  0-9 Units Subcutaneous TID WC Ames Coupe Rhyne, PA-C   5 Units at 02/09/13 1223  . insulin glargine (LANTUS) injection 28 Units  28 Units Subcutaneous  Daily Samantha J Rhyne, PA-C      . labetalol (NORMODYNE,TRANDATE) injection 10 mg  10 mg Intravenous Q2H PRN Samantha J Rhyne, PA-C      . metoprolol (LOPRESSOR) injection 2-5 mg  2-5 mg Intravenous Q2H PRN Samantha J Rhyne, PA-C      . nitroGLYCERIN (NITROSTAT) SL tablet 0.4 mg  0.4 mg Sublingual Q5 min PRN Samantha J Rhyne, PA-C      . ondansetron (ZOFRAN) injection 4 mg  4 mg Intravenous Q6H PRN Samantha J Rhyne, PA-C      . pantoprazole (PROTONIX) EC tablet 40 mg  40 mg Oral Daily Samantha J Rhyne, PA-C   40 mg at 02/07/13 0930  . phenol (CHLORASEPTIC) mouth spray 1 spray  1 spray Mouth/Throat PRN Samantha J Rhyne, PA-C      . piperacillin-tazobactam (ZOSYN) IVPB 2.25 g  2.25 g Intravenous Q8H Sherren Kerns, MD   2.25 g at 02/09/13 1118  . sodium bicarbonate 150 mEq in dextrose 5 % 1,000 mL infusion   Intravenous Continuous Garnetta Buddy, MD 125 mL/hr at 02/08/13 2116

## 2013-02-09 NOTE — Progress Notes (Signed)
Inpatient Diabetes Program Recommendations  AACE/ADA: New Consensus Statement on Inpatient Glycemic Control (2013)  Target Ranges:  Prepandial:   less than 140 mg/dL      Peak postprandial:   less than 180 mg/dL (1-2 hours)      Critically ill patients:  140 - 180 mg/dL  Results for JAYDN, FINCHER (MRN [id number]) as of 02/09/2013 08:53  Ref. Range 02/08/2013 08:00 02/08/2013 10:58 02/08/2013 16:53 02/08/2013 21:41 02/08/2013 23:16 02/09/2013 04:15  Glucose-Capillary Latest Range: 70-99 mg/dL 409 (H) 811 (H) 914 (H) 223 (H) 213 (H) 277 (H)    Inpatient Diabetes Program Recommendations Insulin - Basal: needs home dose of Lantus (ordered this morning) May need addition of Novolog tube feed coverage but will reassess once Lantus has been started (given) Thank you  Piedad Climes BSN, RN,CDE Inpatient Diabetes Coordinator [phone] (team pager)

## 2013-02-09 NOTE — Progress Notes (Signed)
Utilization review completed.  

## 2013-02-09 NOTE — Progress Notes (Signed)
Physical Therapy Treatment Patient Details Name: [name] MRN: [id number] DOB: 1946/02/15 Today's Date: 02/09/2013 Time: 7829-5621 PT Time Calculation (min): 21 min  PT Assessment / Plan / Recommendation  History of Present Illness Pt with infection to R going from previous R fem to BK pop BPG   PT Comments   Pt con't to have lethargy, cognitive deficits and, R LE pain limiting OOB mobility progress. Pt unable to tolerate ambulation at this time. Pt con't to benefit from SNF upon d/c from hospital for maximal functional recovery.   Follow Up Recommendations  SNF     Does the patient have the potential to tolerate intense rehabilitation     Barriers to Discharge        Equipment Recommendations  None recommended by PT    Recommendations for Other Services    Frequency Min 3X/week   Progress towards PT Goals Progress towards PT goals: Progressing toward goals (slowly)  Plan Current plan remains appropriate    Precautions / Restrictions Precautions Precautions: Fall Restrictions Weight Bearing Restrictions: No   Pertinent Vitals/Pain Unable to rate    Mobility  Bed Mobility Bed Mobility: Supine to Sit;Sitting - Scoot to Edge of Bed Supine to Sit: 1: +2 Total assist;HOB elevated Supine to Sit: Patient Percentage: 30% Sitting - Scoot to Edge of Bed: 1: +2 Total assist Sitting - Scoot to Edge of Bed: Patient Percentage: 30% Details for Bed Mobility Assistance: max directional verbal and tactile cues, assist for R LE management due to pain Transfers Transfers: Sit to Stand;Stand to Sit;Stand Pivot Transfers Sit to Stand: 1: +2 Total assist;With upper extremity assist;From bed Sit to Stand: Patient Percentage: 40% Stand to Sit: 1: +2 Total assist;With upper extremity assist;To chair/3-in-1 Stand to Sit: Patient Percentage: 40% Stand Pivot Transfers: 1: +2 Total assist Stand Pivot Transfers: Patient Percentage: 40% Details for Transfer Assistance: pt guarded due to  R LE pain, max tactile and directional v/c's for transfer Ambulation/Gait Ambulation/Gait Assistance: Not tested (comment)    Exercises     PT Diagnosis:    PT Problem List:   PT Treatment Interventions:     PT Goals (current goals can now be found in the care plan section)    Visit Information  Last PT Received On: 02/09/13 Assistance Needed: +2 History of Present Illness: Pt with infection to R going from previous R fem to BK pop BPG    Subjective Data      Cognition  Cognition Arousal/Alertness: Lethargic Behavior During Therapy: Flat affect Overall Cognitive Status: No family/caregiver present to determine baseline cognitive functioning Orientation Level: Disoriented to;Time;Situation Current Attention Level: Focused Memory: Decreased short-term memory Problem Solving: Slow processing;Requires verbal cues;Decreased initiation;Difficulty sequencing;Requires tactile cues    Balance  Balance Balance Assessed: Yes Static Sitting Balance Static Sitting - Balance Support: Bilateral upper extremity supported;Feet supported Static Sitting - Level of Assistance: 4: Min assist Static Sitting - Comment/# of Minutes: 3  End of Session PT - End of Session Equipment Utilized During Treatment: Gait belt;Oxygen Activity Tolerance: Patient limited by pain;Patient limited by lethargy Patient left: in chair;with call bell/phone within reach (mittens on ) Nurse Communication: Mobility status   GP     Marcene Brawn 02/09/2013, 12:32 PM  Lewis Shock, PT, DPT Pager #: [phone] Office #: [phone]

## 2013-02-09 NOTE — Progress Notes (Signed)
[facility]  Patient Status: Active (receiving services up to time of hospitalization)  [facility] is providing the following services: RN, PT, OT and MSW  If patient discharges after hours, please call [phone].   [name] 02/09/2013, 9:27 AM

## 2013-02-09 NOTE — Progress Notes (Signed)
[facility] [place]  Subjective  - Still lethargic but arousable with stimulation,  Pulled feeding tube this morning   Objective 127/63 73 98.2 F (36.8 C) (Oral) 34 97%  Intake/Output Summary (Last 24 hours) at 02/09/13 1137 Last data filed at 02/09/13 0900  Gross per 24 hour  Intake 3152.08 ml  Output    720 ml  Net 2432.08 ml   RLE- VAC on right groin, right foot pink warm with doppler signals  Assessment/Planning: Renal failure creatinine pleateau still with reasonable urine output appreciate renal assistance, does not appear to be uremic or acidotic or fluid overloaded at this point, keep foley  Right groin wound continue VAC recheck tomorrow may need further debridement Wednesday, continue antibiotics high  Risk for graft infection  Severe protein calorie malnutrition: spoke with son yesterday and emphasized she needs feeding tube to supplement her for wound healing  FIELDS,CHARLES E 02/09/2013 11:37 AM --  Laboratory Lab Results:  Recent Labs  02/07/13 0120 02/08/13 0525  WBC 18.5* 18.3*  HGB 11.7* 10.9*  HCT 34.2* 32.7*  PLT 189 173   BMET  Recent Labs  02/08/13 0525 02/09/13 0820  NA 140 137  K 4.9 4.0  CL 105 96  CO2 17* 28  GLUCOSE 143* 311*  BUN 95* 90*  CREATININE 4.73* 4.52*  CALCIUM 8.4 7.9*    COAG Lab Results  Component Value Date   INR 1.09 01/13/2013   INR 1.03 04/16/2012   INR 1.07 05/17/2011   No results found for this basename: PTT

## 2013-02-09 NOTE — Progress Notes (Addendum)
INITIAL NUTRITION ASSESSMENT  DOCUMENTATION CODES Per approved criteria  -Obesity Unspecified   INTERVENTION:  Once feeding tube re-inserted, re-initiate Jevity 1.2 formula at 20 ml/hr and increase by 10 ml every 4 hours to goal rate of 45 ml/hr with Prostat liquid protein 30 ml 3 times daily via tube to provide 1596 kcals, 105 gm protein, 872 ml of free water RD to follow for nutrition care plan  NUTRITION DIAGNOSIS: Inadequate oral intake related to lethargy as evidenced by PO intake 30%   Goal: EN to meet > 90% of estimated nutrition needs  Monitor:  EN regimen & tolerance, PO intake, weight, labs, I/O's  Reason for Assessment: Consult  67 y.o. female  Admitting Dx: nonhealing wound right groin  ASSESSMENT: Patient with PMH of stroke and CABG; recently underwent right femoral to below-knee popliteal bypass on 9/16; began complaining of swelling in right groin and some redness; presented to the Vascular Surgery office with drainage from her right groin.  Patient s/p procedure 10/3: DEBRIDEMENT RIGHT GROIN PLACEMENT OF VAC DRESSING  Small-bore feeding tube out upon RD visit; patient pulled.  Per abdominal X-ray tube tip was at distal fundus near antrum.  Jevity 1.2 formula previously infusing at 50 ml/hr providing 1440 kcals, 67 gm protien, 968 ml of free water.  Per RN, ate a little this AM, ~ 30%.  Nephrology following; patient may need dialysis.  RD consulted for EN management.  Height: Ht Readings from Last 1 Encounters:  02/06/13 5' (1.524 m)    Weight: Wt Readings from Last 1 Encounters:  02/09/13 161 lb 9.6 oz (73.3 kg)    Ideal Body Weight: 100 lb  % Ideal Body Weight: 161%  Wt Readings from Last 10 Encounters:  02/09/13 161 lb 9.6 oz (73.3 kg)  02/09/13 161 lb 9.6 oz (73.3 kg)  02/05/13 159 lb (72.122 kg)  01/29/13 159 lb (72.122 kg)  01/26/13 159 lb 8 oz (72.349 kg)  01/26/13 159 lb 8 oz (72.349 kg)  01/13/13 155 lb (70.308 kg)  01/02/13 156 lb  (70.761 kg)  01/02/13 156 lb (70.761 kg)  12/25/12 159 lb (72.122 kg)    Usual Body Weight: 159 lb  % Usual Body Weight: 101%  BMI:  Body mass index is 31.56 kg/(m^2).  Estimated Nutritional Needs: Kcal: 1500-1700 Protein: 100-110 gm Fluid: 1.5-1.7 L  Skin: wound VAC to right groin  Diet Order: Full Liquid  EDUCATION NEEDS: -No education needs identified at this time   Intake/Output Summary (Last 24 hours) at 02/09/13 1119 Last data filed at 02/09/13 0900  Gross per 24 hour  Intake 3152.08 ml  Output    820 ml  Net 2332.08 ml    Labs:   Recent Labs Lab 02/07/13 0120 02/08/13 0525 02/09/13 0820  NA 140 140 137  K 4.6 4.9 4.0  CL 104 105 96  CO2 15* 17* 28  BUN 97* 95* 90*  CREATININE 4.29* 4.73* 4.52*  CALCIUM 8.2* 8.4 7.9*  GLUCOSE 98 143* 311*    CBG (last 3)   Recent Labs  02/08/13 2316 02/09/13 0415 02/09/13 0757  GLUCAP 213* 277* 313*    Scheduled Meds: . aspirin  81 mg Oral Daily  . atorvastatin  20 mg Oral q1800  . budesonide-formoterol  2 puff Inhalation BID  . docusate sodium  100 mg Oral Daily  . DULoxetine  60 mg Oral Daily  . enoxaparin (LOVENOX) injection  30 mg Subcutaneous Q24H  . feeding supplement  1 Container Oral TID WC  .  insulin aspart  0-9 Units Subcutaneous TID WC  . insulin glargine  28 Units Subcutaneous Daily  . pantoprazole  40 mg Oral Daily  . piperacillin-tazobactam (ZOSYN)  IV  2.25 g Intravenous Q8H    Continuous Infusions: . DOPamine    . feeding supplement (JEVITY 1.2 CAL) 1,000 mL (02/09/13 0100)  .  sodium bicarbonate  infusion 1000 mL 125 mL/hr at 02/08/13 2116    Past Medical History  Diagnosis Date  . Coronary artery disease     a. s/p CABG 2007 (L-RI, S-dRCA, S-LAD, S-OM);  b. myoview 6/13:  anteroapical MI, no ischemia, EF 31%  . Diabetes mellitus   . Hypertension   . Asthma   . Bronchitis   . Reflux   . Hyperlipidemia   . Chronic systolic heart failure     a. echo 6/13: mild LVH, EF  15-20%, diff HK, Gr 1 DD, mild reduced RVSF, PASP 32.  Marland Kitchen GERD (gastroesophageal reflux disease)   . Arthritis   . CKD (chronic kidney disease)   . Chronic back pain   . Hypoxia     a. Adm 09/2012 - discharged with home O2 in setting of CHF.  Marland Kitchen Abnormal stress test     a. Nuc 10/2011: bright target which may be in left breast or L chest wall or axilla. Pt reports she f/u with PCP and mammogram was OK (left breast cyst).  . Complication of anesthesia     slow to awaken  . 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 Anginal pain     "rarely, had some 2 months ago, not sure if heart or arm, cardiologist aware.  . CHF (congestive heart failure)   . Shortness of breath     at times- sensitive to perfume.  . Peripheral vascular disease   . Depression   . Stroke 05/2011    a. R ischemic CVA 05/2011. Right hand weakness.  . Pneumonia     2010ish  . Neuropathic pain     feet and hands  . Polio     as a child  . Altered level of consciousness     2 weeks now    Past Surgical History  Procedure Laterality Date  . Coronary artery bypass graft      LIMA to RI, SVG to distal RCA, SVG to LAD, SVG to OM. 2007  . Joint replacement      Right hip replacement   . Debridement skin / sq / muscle of trunk      s/p staph infection from CABG 2007  . Cystectomy      Subcutaneous  . Partial hip arthroplasty    . Stroke    . Femoral-popliteal bypass graft Right 01/20/2013    Procedure: BYPASS GRAFT RIGHT FEMORAL-BELOW KNEE POPLITEAL ARTERY WITH RINGED GORTEX GRAFT ;  Surgeon: Sherren Kerns, MD;  Location: [facility] OR;  Service: Vascular;  Laterality: Right;  . Patch angioplasty Right 01/20/2013    Procedure: VEIN  PATCH ANGIOPLASTY;  Surgeon: Sherren Kerns, MD;  Location: [facility] OR;  Service: Vascular;  Laterality: Right;    Maureen Chatters, RD, LDN Pager #: [phone] After-Hours Pager #: [phone]

## 2013-02-09 NOTE — Progress Notes (Signed)
Attempted to see pt.  PT saw pt this am and just returned to bed.  Pt had pulled out feeding tube that was just replaced and pt calmly lying in bed for  first time today.  Will defer eval until tomorrow morning. Tory Emerald, [place] [phone]

## 2013-02-10 ENCOUNTER — Encounter (HOSPITAL_COMMUNITY): Payer: Self-pay | Admitting: Vascular Surgery

## 2013-02-10 ENCOUNTER — Inpatient Hospital Stay (HOSPITAL_COMMUNITY): Payer: Medicare Other

## 2013-02-10 LAB — RENAL FUNCTION PANEL
BUN: 80 mg/dL — ABNORMAL HIGH (ref 6–23)
Chloride: 97 mEq/L (ref 96–112)
Glucose, Bld: 234 mg/dL — ABNORMAL HIGH (ref 70–99)
Phosphorus: 6.3 mg/dL — ABNORMAL HIGH (ref 2.3–4.6)
Potassium: 3.8 mEq/L (ref 3.5–5.1)

## 2013-02-10 LAB — CBC WITH DIFFERENTIAL/PLATELET
Basophils Relative: 0 % (ref 0–1)
Eosinophils Absolute: 0.1 10*3/uL (ref 0.0–0.7)
HCT: 31.4 % — ABNORMAL LOW (ref 36.0–46.0)
Hemoglobin: 10.8 g/dL — ABNORMAL LOW (ref 12.0–15.0)
Lymphocytes Relative: 8 % — ABNORMAL LOW (ref 12–46)
MCH: 29.8 pg (ref 26.0–34.0)
MCHC: 34.4 g/dL (ref 30.0–36.0)
Monocytes Absolute: 0.8 10*3/uL (ref 0.1–1.0)
Monocytes Relative: 6 % (ref 3–12)
Neutro Abs: 10.9 10*3/uL — ABNORMAL HIGH (ref 1.7–7.7)
WBC: 12.9 10*3/uL — ABNORMAL HIGH (ref 4.0–10.5)

## 2013-02-10 LAB — URINE CULTURE: Culture: NO GROWTH

## 2013-02-10 LAB — GLUCOSE, CAPILLARY
Glucose-Capillary: 137 mg/dL — ABNORMAL HIGH (ref 70–99)
Glucose-Capillary: 209 mg/dL — ABNORMAL HIGH (ref 70–99)
Glucose-Capillary: 266 mg/dL — ABNORMAL HIGH (ref 70–99)
Glucose-Capillary: 289 mg/dL — ABNORMAL HIGH (ref 70–99)

## 2013-02-10 LAB — C4 COMPLEMENT: Complement C4, Body Fluid: 25 mg/dL (ref 10–40)

## 2013-02-10 MED ORDER — VANCOMYCIN HCL IN DEXTROSE 1-5 GM/200ML-% IV SOLN
1000.0000 mg | INTRAVENOUS | Status: DC
  Administered 2013-02-11: 1000 mg via INTRAVENOUS
  Filled 2013-02-10: qty 200

## 2013-02-10 MED ORDER — LACTATED RINGERS IV SOLN
INTRAVENOUS | Status: DC
  Administered 2013-02-10: 50 mL/h via INTRAVENOUS

## 2013-02-10 MED ORDER — ALPRAZOLAM 0.5 MG PO TABS
0.5000 mg | ORAL_TABLET | Freq: Once | ORAL | Status: AC
  Administered 2013-02-10: 0.5 mg via ORAL
  Filled 2013-02-10: qty 1

## 2013-02-10 NOTE — Progress Notes (Addendum)
[facility] [place]  Subjective  - confused, restraints in place.   Objective 147/85 94 98.1 F (36.7 C) (Axillary) 24 93%  Intake/Output Summary (Last 24 hours) at 02/10/13 0745 Last data filed at 02/10/13 0700  Gross per 24 hour  Intake   2205 ml  Output   1295 ml  Net    910 ml   Right foot warm and active range of motion. Doppler signals Wound vac in place   Assessment/Planning: Kangaroo tube in place. Severe protein calorie malnutrition Wound vac will be changed later today with Dr. Darrick Penna.  [name] [facility] 02/10/2013 7:45 AM --  Laboratory Lab Results:  Recent Labs  02/08/13 0525 02/10/13 0440  WBC 18.3* 12.9*  HGB 10.9* 10.8*  HCT 32.7* 31.4*  PLT 173 179   BMET  Recent Labs  02/09/13 0820 02/10/13 0440  NA 137 141  K 4.0 3.8  CL 96 97  CO2 28 29  GLUCOSE 311* 234*  BUN 90* 80*  CREATININE 4.52* 3.76*  CALCIUM 7.9* 7.8*    COAG Lab Results  Component Value Date   INR 1.09 01/13/2013   INR 1.03 04/16/2012   INR 1.07 05/17/2011   No results found for this basename: PTT    More confused today than yesterday.  Knows name only. BUN/Cr slightly improved Feeding tube in place Right groin with some granulation 10% but large amount of fibrinous exudate  A: Poorly healing right groin will need further debridement in OR tomorrow possible graft removal Leukocytosis improved hopefully indication active infection is improved Severe malnutrition continue tube feeds Renal failure reasonable urine output BUN Cr drifting down, appreciate renal service assistance Granddaughter updated on pt condition  Heidi Bruns, MD [facility] [place] Office: [phone] Pager: [phone]

## 2013-02-10 NOTE — Progress Notes (Signed)
Chaplain responded to referral from nurse. Upon entering room, patient seemed friendly and alert. However, she quickly became confused and had difficulty finishing sentences. Patient then appeared to doze off. Chaplain said goodbye, and she briefly roused and responded. Chaplain will follow up if necessary.

## 2013-02-10 NOTE — Progress Notes (Signed)
Admit: 02/06/2013 LOS: 4  30F w/ PVD s/p Fem-Pop bypass 9/16 c/b by wound infection s/p OR debriedment 10/3 and known CKD (BL SCr 1.5) with AKI likely ATN  Subjective:  Confused/Delirious overnight req restraints Plan for OR tomorrow for further debridement Now on enteral nutrition UOP improved, SCr decreased from yesterday No Eos on CBC Leukocytosis improved  10/06 0701 - 10/07 0700 In: 2205 [I.V.:1350; NG/GT:755; IV Piggyback:100] Out: 1295 [Urine:1020; Drains:275]  Filed Weights   02/06/13 1700 02/09/13 0400 02/10/13 0500  Weight: 72.122 kg (159 lb) 73.3 kg (161 lb 9.6 oz) 75.1 kg (165 lb 9.1 oz)    Current meds: reviewed including Vanc, Zosyn, NaHCO3 @ 81mL/hr Current Labs: reviewed   Physical Exam:  Blood pressure 161/83, pulse 87, temperature 97.5 F (36.4 C), temperature source Oral, resp. rate 20, height 5' (1.524 m), weight 75.1 kg (165 lb 9.1 oz), SpO2 95.00%. NAD ENT: NGT in place EYES: EOMI CV:RRR, nl s1s2 LUNGS: scattered wheezing, nl wob.   EXT: no LEE ABD: soft, nontender SKIN bandages noted.  Assessment/Plan 1. AoCKD: Likely ATN.  Atheroembolic disease considered but no eosinophilia on CBC, complement levels pending.  Some recovery over past 24h.  Electrolytes stable.  Will stop Maalox given renal faiulre.   2. Acidosis: NaHCO3 normalized.  Will switch over to LR at 62mL/hr.     Sabra Heck MD 02/10/2013, 10:47 AM   Recent Labs Lab 02/08/13 0525 02/09/13 0820 02/10/13 0440  NA 140 137 141  K 4.9 4.0 3.8  CL 105 96 97  CO2 17* 28 29  GLUCOSE 143* 311* 234*  BUN 95* 90* 80*  CREATININE 4.73* 4.52* 3.76*  CALCIUM 8.4 7.9* 7.8*  PHOS  --   --  6.3*    Recent Labs Lab 02/07/13 0120 02/08/13 0525 02/10/13 0440  WBC 18.5* 18.3* 12.9*  NEUTROABS  --   --  10.9*  HGB 11.7* 10.9* 10.8*  HCT 34.2* 32.7* 31.4*  MCV 88.4 90.1 86.5  PLT 189 173 179    Current Facility-Administered Medications  Medication Dose Route Frequency Provider Last  Rate Last Dose  . acetaminophen (TYLENOL) tablet 325-650 mg  325-650 mg Oral Q4H PRN Ames Coupe Rhyne, PA-C   650 mg at 02/09/13 0831   Or  . acetaminophen (TYLENOL) suppository 325-650 mg  325-650 mg Rectal Q4H PRN Samantha J Rhyne, PA-C      . albuterol (PROVENTIL HFA;VENTOLIN HFA) 108 (90 BASE) MCG/ACT inhaler 2 puff  2 puff Inhalation Q6H PRN Samantha J Rhyne, PA-C      . ALPRAZolam Prudy Feeler) tablet 0.5 mg  0.5 mg Oral Daily PRN Ames Coupe Rhyne, PA-C   0.5 mg at 02/10/13 1039  . alum & mag hydroxide-simeth (MAALOX/MYLANTA) 200-200-20 MG/5ML suspension 15-30 mL  15-30 mL Oral Q2H PRN Samantha J Rhyne, PA-C      . aspirin chewable tablet 81 mg  81 mg Oral Daily Samantha J Rhyne, PA-C   81 mg at 02/10/13 1032  . atorvastatin (LIPITOR) tablet 20 mg  20 mg Oral q1800 Samantha J Rhyne, PA-C   20 mg at 02/09/13 1800  . budesonide-formoterol (SYMBICORT) 160-4.5 MCG/ACT inhaler 2 puff  2 puff Inhalation BID Ames Coupe Rhyne, PA-C   2 puff at 02/10/13 0819  . docusate sodium (COLACE) capsule 100 mg  100 mg Oral Daily Samantha J Rhyne, PA-C   100 mg at 02/10/13 1032  . DOPamine (INTROPIN) 800 mg in dextrose 5 % 250 mL infusion  3-5 mcg/kg/min Intravenous Continuous Lelon Mast  J Rhyne, PA-C      . DULoxetine (CYMBALTA) DR capsule 60 mg  60 mg Oral Daily Samantha J Rhyne, PA-C   60 mg at 02/10/13 1032  . enoxaparin (LOVENOX) injection 30 mg  30 mg Subcutaneous Q24H Samantha J Rhyne, PA-C   30 mg at 02/10/13 1032  . feeding supplement (ENSURE) pudding 1 Container  1 Container Oral TID BM Ailene Ards, RD   1 Container at 02/09/13 1400  . feeding supplement (JEVITY 1.2 CAL) liquid 1,000 mL  1,000 mL Per Tube Continuous Ailene Ards, RD 45 mL/hr at 02/10/13 0900 1,000 mL at 02/10/13 0900  . feeding supplement (PRO-STAT SUGAR FREE 64) liquid 30 mL  30 mL Per Tube TID Ailene Ards, RD   30 mL at 02/10/13 1032  . guaiFENesin-dextromethorphan (ROBITUSSIN DM) 100-10 MG/5ML syrup 15 mL  15 mL  Oral Q4H PRN Samantha J Rhyne, PA-C      . hydrALAZINE (APRESOLINE) injection 10 mg  10 mg Intravenous Q2H PRN Samantha J Rhyne, PA-C      . insulin aspart (novoLOG) injection 0-9 Units  0-9 Units Subcutaneous TID WC Ames Coupe Rhyne, PA-C   5 Units at 02/10/13 0811  . insulin glargine (LANTUS) injection 28 Units  28 Units Subcutaneous Daily Ames Coupe Rhyne, PA-C   28 Units at 02/10/13 1033  . labetalol (NORMODYNE,TRANDATE) injection 10 mg  10 mg Intravenous Q2H PRN Samantha J Rhyne, PA-C      . metoprolol (LOPRESSOR) injection 2-5 mg  2-5 mg Intravenous Q2H PRN Samantha J Rhyne, PA-C      . nitroGLYCERIN (NITROSTAT) SL tablet 0.4 mg  0.4 mg Sublingual Q5 min PRN Samantha J Rhyne, PA-C      . ondansetron (ZOFRAN) injection 4 mg  4 mg Intravenous Q6H PRN Samantha J Rhyne, PA-C      . pantoprazole (PROTONIX) EC tablet 40 mg  40 mg Oral Daily Samantha J Rhyne, PA-C   40 mg at 02/10/13 1032  . phenol (CHLORASEPTIC) mouth spray 1 spray  1 spray Mouth/Throat PRN Samantha J Rhyne, PA-C      . piperacillin-tazobactam (ZOSYN) IVPB 2.25 g  2.25 g Intravenous Q8H Sherren Kerns, MD   2.25 g at 02/10/13 0447  . sodium bicarbonate 150 mEq in dextrose 5 % 1,000 mL infusion   Intravenous Continuous Arita Miss, MD 50 mL/hr at 02/09/13 1900

## 2013-02-10 NOTE — Progress Notes (Signed)
Inpatient Diabetes Program Recommendations  AACE/ADA: New Consensus Statement on Inpatient Glycemic Control (2013)  Target Ranges:  Prepandial:   less than 140 mg/dL      Peak postprandial:   less than 180 mg/dL (1-2 hours)      Critically ill patients:  140 - 180 mg/dL  Results for ANIELLA, WANDREY (MRN [id number]) as of 02/10/2013 09:21  Ref. Range 02/09/2013 15:32 02/09/2013 20:24 02/10/2013 00:05 02/10/2013 04:37 02/10/2013 07:47  Glucose-Capillary Latest Range: 70-99 mg/dL 956 (H) 213 (H) 086 (H) 209 (H) 266 (H)   Inpatient Diabetes Program received consult 10/5: "maximize glucose control (wound infection)" .  Diabetes Coordinator assessed patient 10/6 for glycemic control and found CBGs 200-300.  Patient needs basal insulin which was already ordered to be given yesterday morning.  Upon reassessment this morning it was discovered that Lantus was not given yesterday.  This coordinator called the RN taking care of patient to inquire why and it was reported that Dr. Darrick Penna wanted the Lantus held bc patient had removed feeding tube.  RN reports that Lantus will be given this morning therefore expect improvement in CBGs.  Will follow. Thank you  Piedad Climes BSN, RN,CDE Inpatient Diabetes Coordinator [phone] (team pager)

## 2013-02-10 NOTE — Progress Notes (Signed)
OT Cancellation Note  Patient Details Name: [name] MRN: [id number] DOB: 1946-04-10   Cancelled Treatment:    Reason Eval/Treat Not Completed: Other (comment) Pt scheduled for further debridement tomorrow. Will assess after surgery. Please note activity level after surgery. Thank you. [facility] Lillia Lengel, OTR/L  [phone] 02/10/2013 02/10/2013, 5:01 PM

## 2013-02-10 NOTE — Progress Notes (Signed)
ANTIBIOTIC CONSULT NOTE - Follow Up  Pharmacy Consult for Vancomycin / Zosyn Indication: R groin abscess  Allergies  Allergen Reactions  . Sulfa Antibiotics Anaphylaxis and Shortness Of Breath  . Codeine Nausea Only and Other (See Comments)    Severe headache  . Ivp Dye [Iodinated Diagnostic Agents] Hives and Other (See Comments)    Severe anxiety  . Penicillins Swelling and Other (See Comments)    "huge sores". Pt has tolerated both Zosyn and Keflex in the past.    Patient Measurements: Height: 5' (152.4 cm) Weight: 165 lb 9.1 oz (75.1 kg) IBW/kg (Calculated) : 45.5  Vital Signs: Temp: 97.5 F (36.4 C) (10/07 0800) Temp src: Oral (10/07 0800) BP: 161/83 mmHg (10/07 0800) Pulse Rate: 87 (10/07 0800) Intake/Output from previous day: 10/06 0701 - 10/07 0700 In: 2205 [I.V.:1350; NG/GT:755; IV Piggyback:100] Out: 1295 [Urine:1020; Drains:275] Intake/Output from this shift: Total I/O In: 100 [I.V.:100] Out: 300 [Urine:250; Drains:50]  Labs:  Recent Labs  02/08/13 0525 02/09/13 0820 02/10/13 0440  WBC 18.3*  --  12.9*  HGB 10.9*  --  10.8*  PLT 173  --  179  CREATININE 4.73* 4.52* 3.76*   Estimated Creatinine Clearance: 13.3 ml/min (by C-G formula based on Cr of 3.76).  Recent Labs  02/08/13 1140 02/10/13 0440  VANCORANDOM 9.8 20.6     Microbiology: Recent Results (from the past 720 hour(s))  SURGICAL PCR SCREEN     Status: None   Collection Time    01/13/13  2:18 PM      Result Value Range Status   MRSA, PCR NEGATIVE  NEGATIVE Final   Staphylococcus aureus NEGATIVE  NEGATIVE Final   Comment:            The Xpert SA Assay (FDA     approved for NASAL specimens     in patients over 4 years of age),     is one component of     a comprehensive surveillance     program.  Test performance has     been validated by [employer] for patients greater     than or equal to 27 year old.     It is not intended     to diagnose infection nor to     guide or  monitor treatment.  WOUND CULTURE     Status: None   Collection Time    02/06/13 11:31 AM      Result Value Range Status   Specimen Description WOUND RIGHT GROIN   Final   Special Requests PT ON VANCOMYCIN   Final   Gram Stain     Final   Value: NO WBC SEEN     NO SQUAMOUS EPITHELIAL CELLS SEEN     MODERATE GRAM NEGATIVE RODS     Performed at [facility]   Culture     Final   Value: MODERATE SERRATIA MARCESCENS     Performed at [facility]   Report Status 02/09/2013 FINAL   Final   Organism ID, Bacteria SERRATIA MARCESCENS   Final  URINE CULTURE     Status: None   Collection Time    02/08/13  3:26 PM      Result Value Range Status   Specimen Description URINE, CLEAN CATCH   Final   Special Requests NONE   Final   Culture  Setup Time     Final   Value: 02/08/2013 23:11     Performed at  [facility] [employer] Count     Final   Value: NO GROWTH     Performed at [facility]     Final   Value: NO GROWTH     Performed at [facility]   Report Status 02/10/2013 FINAL   Final    Assessment: Pt on Vancomycin and Zosyn Day #5 for R groin abscess. S/p OR debridement 10/3, plan back to OR again 10/8. WBC down to 12.9. Afeb.  Vanc 10/3 (intermittent doses due to renal fxn) >> 10/5 Random Vanc 9.8 10/7 Random Vanc 20.6 Zosyn 10/3 >>  10/3 R groin abscess >> Sens Cefepime/Ceftaz/Roceph/Cipro/Gent/Tobra/Bact Resis Ancef/Cefoxitin 10/5 Urine>>neg  Goal of Therapy:  Vancomycin trough level 15-20 mcg/ml  Plan:  - Zosyn 2.25 gm IV q8h. - Will go ahead and schedule Vancomycin 1gm IV q48h to start tomorrow a.m. - if SCr doesn't cont downward trend, may need to get more random levels - Consider d/c Vanc with Serratia in R groin abscess - could consider de-escalating abx to Cipro  Christoper Fabian, PharmD, BCPS Clinical pharmacist, pager [phone] 02/10/2013 10:44 AM

## 2013-02-11 ENCOUNTER — Inpatient Hospital Stay (HOSPITAL_COMMUNITY): Payer: Medicare Other

## 2013-02-11 ENCOUNTER — Inpatient Hospital Stay (HOSPITAL_COMMUNITY): Payer: Medicare Other | Admitting: Certified Registered Nurse Anesthetist

## 2013-02-11 ENCOUNTER — Encounter (HOSPITAL_COMMUNITY)
Admission: RE | Disposition: A | Discharge status: Home Health | Payer: Self-pay | Source: Ambulatory Visit | Attending: Vascular Surgery

## 2013-02-11 ENCOUNTER — Encounter (HOSPITAL_COMMUNITY): Payer: Self-pay | Admitting: Certified Registered Nurse Anesthetist

## 2013-02-11 ENCOUNTER — Encounter (HOSPITAL_COMMUNITY): Payer: Medicare Other | Admitting: Certified Registered Nurse Anesthetist

## 2013-02-11 DIAGNOSIS — T8189XA Other complications of procedures, not elsewhere classified, initial encounter: Secondary | ICD-10-CM

## 2013-02-11 HISTORY — PX: GROIN DEBRIDEMENT: SHX5159

## 2013-02-11 LAB — GLUCOSE, CAPILLARY
Glucose-Capillary: 243 mg/dL — ABNORMAL HIGH (ref 70–99)
Glucose-Capillary: 211 mg/dL — ABNORMAL HIGH (ref 70–99)
Glucose-Capillary: 177 mg/dL — ABNORMAL HIGH (ref 70–99)

## 2013-02-11 LAB — CBC
HCT: 33.4 % — ABNORMAL LOW (ref 36.0–46.0)
Hemoglobin: 11 g/dL — ABNORMAL LOW (ref 12.0–15.0)
MCH: 29.6 pg (ref 26.0–34.0)
MCHC: 32.9 g/dL (ref 30.0–36.0)
WBC: 15 10*3/uL — ABNORMAL HIGH (ref 4.0–10.5)

## 2013-02-11 LAB — BASIC METABOLIC PANEL
BUN: 77 mg/dL — ABNORMAL HIGH (ref 6–23)
Chloride: 101 mEq/L (ref 96–112)
GFR calc Af Amer: 19 mL/min — ABNORMAL LOW (ref >90)
Glucose, Bld: 167 mg/dL — ABNORMAL HIGH (ref 70–99)
Potassium: 3.5 mEq/L (ref 3.5–5.1)

## 2013-02-11 SURGERY — DEBRIDEMENT, INGUINAL REGION
Anesthesia: General | Site: Groin | Laterality: Right | Wound class: Dirty or Infected

## 2013-02-11 MED ORDER — GLYCOPYRROLATE 0.2 MG/ML IJ SOLN
INTRAMUSCULAR | Status: DC | PRN
  Administered 2013-02-11: .8 mg via INTRAVENOUS

## 2013-02-11 MED ORDER — ARTIFICIAL TEARS OP OINT
TOPICAL_OINTMENT | OPHTHALMIC | Status: DC | PRN
  Administered 2013-02-11: 1 via OPHTHALMIC

## 2013-02-11 MED ORDER — ROCURONIUM BROMIDE 100 MG/10ML IV SOLN
INTRAVENOUS | Status: DC | PRN
  Administered 2013-02-11: 30 mg via INTRAVENOUS

## 2013-02-11 MED ORDER — 0.9 % SODIUM CHLORIDE (POUR BTL) OPTIME
TOPICAL | Status: DC | PRN
  Administered 2013-02-11: 1000 mL

## 2013-02-11 MED ORDER — LACTATED RINGERS IV SOLN
INTRAVENOUS | Status: DC
  Administered 2013-02-11 – 2013-02-14 (×3): via INTRAVENOUS

## 2013-02-11 MED ORDER — NEOSTIGMINE METHYLSULFATE 1 MG/ML IJ SOLN
INTRAMUSCULAR | Status: DC | PRN
  Administered 2013-02-11: 5 mg via INTRAVENOUS

## 2013-02-11 MED ORDER — SODIUM CHLORIDE 0.9 % IR SOLN
Status: DC | PRN
  Administered 2013-02-11: 14:00:00

## 2013-02-11 MED ORDER — THROMBIN 20000 UNITS EX SOLR
CUTANEOUS | Status: AC
  Filled 2013-02-11: qty 20000

## 2013-02-11 MED ORDER — DEXAMETHASONE SODIUM PHOSPHATE 4 MG/ML IJ SOLN
INTRAMUSCULAR | Status: DC | PRN
  Administered 2013-02-11: 8 mg via INTRAVENOUS

## 2013-02-11 MED ORDER — ALBUTEROL SULFATE (5 MG/ML) 0.5% IN NEBU
2.5000 mg | INHALATION_SOLUTION | RESPIRATORY_TRACT | Status: DC
  Administered 2013-02-11 – 2013-02-12 (×4): 2.5 mg via RESPIRATORY_TRACT
  Filled 2013-02-11 (×4): qty 0.5

## 2013-02-11 MED ORDER — PROPOFOL 10 MG/ML IV BOLUS
INTRAVENOUS | Status: DC | PRN
  Administered 2013-02-11: 50 mg via INTRAVENOUS

## 2013-02-11 MED ORDER — FUROSEMIDE 10 MG/ML IJ SOLN
80.0000 mg | Freq: Two times a day (BID) | INTRAMUSCULAR | Status: DC
  Administered 2013-02-11 – 2013-02-12 (×2): 80 mg via INTRAVENOUS
  Filled 2013-02-11 (×7): qty 8

## 2013-02-11 MED ORDER — IPRATROPIUM BROMIDE 0.02 % IN SOLN
0.5000 mg | RESPIRATORY_TRACT | Status: DC
  Administered 2013-02-11 – 2013-02-12 (×4): 0.5 mg via RESPIRATORY_TRACT
  Filled 2013-02-11 (×4): qty 2.5

## 2013-02-11 MED ORDER — LIDOCAINE HCL (CARDIAC) 20 MG/ML IV SOLN
INTRAVENOUS | Status: DC | PRN
  Administered 2013-02-11: 40 mg via INTRAVENOUS

## 2013-02-11 MED ORDER — SODIUM CHLORIDE 0.9 % IV SOLN
INTRAVENOUS | Status: DC | PRN
  Administered 2013-02-11: 12:00:00 via INTRAVENOUS

## 2013-02-11 MED ORDER — FENTANYL CITRATE 0.05 MG/ML IJ SOLN
INTRAMUSCULAR | Status: DC | PRN
  Administered 2013-02-11: 50 ug via INTRAVENOUS

## 2013-02-11 MED ORDER — ALBUTEROL SULFATE (5 MG/ML) 0.5% IN NEBU
2.5000 mg | INHALATION_SOLUTION | RESPIRATORY_TRACT | Status: DC | PRN
  Administered 2013-02-12: 2.5 mg via RESPIRATORY_TRACT
  Filled 2013-02-11 (×2): qty 0.5

## 2013-02-11 MED ORDER — ONDANSETRON HCL 4 MG/2ML IJ SOLN
INTRAMUSCULAR | Status: DC | PRN
  Administered 2013-02-11: 4 mg via INTRAMUSCULAR

## 2013-02-11 SURGICAL SUPPLY — 71 items
ADH SKN CLS APL DERMABOND .7 (GAUZE/BANDAGES/DRESSINGS)
BANDAGE ESMARK 6X9 LF (GAUZE/BANDAGES/DRESSINGS) IMPLANT
BANDAGE GAUZE ELAST BULKY 4 IN (GAUZE/BANDAGES/DRESSINGS) IMPLANT
BLADE KNIFE  10 PERSONNA (BLADE) ×2 IMPLANT
BNDG CMPR 9X6 STRL LF SNTH (GAUZE/BANDAGES/DRESSINGS)
BNDG ESMARK 6X9 LF (GAUZE/BANDAGES/DRESSINGS)
CANISTER SUCTION 2500CC (MISCELLANEOUS) ×3 IMPLANT
CANISTER WOUND CARE 500ML ATS (WOUND CARE) ×2 IMPLANT
CANNULA VESSEL 3MM 2 BLNT TIP (CANNULA) ×2 IMPLANT
CLIP TI MEDIUM 24 (CLIP) ×3 IMPLANT
CLIP TI WIDE RED SMALL 24 (CLIP) ×3 IMPLANT
CORONARY SUCKER SOFT TIP 10052 (MISCELLANEOUS) IMPLANT
COVER SURGICAL LIGHT HANDLE (MISCELLANEOUS) ×3 IMPLANT
CUFF TOURNIQUET SINGLE 24IN (TOURNIQUET CUFF) IMPLANT
CUFF TOURNIQUET SINGLE 34IN LL (TOURNIQUET CUFF) IMPLANT
CUFF TOURNIQUET SINGLE 44IN (TOURNIQUET CUFF) IMPLANT
DECANTER SPIKE VIAL GLASS SM (MISCELLANEOUS) IMPLANT
DERMABOND ADVANCED (GAUZE/BANDAGES/DRESSINGS)
DERMABOND ADVANCED .7 DNX12 (GAUZE/BANDAGES/DRESSINGS) IMPLANT
DRAIN SNY WOU (WOUND CARE) IMPLANT
DRAPE WARM FLUID 44X44 (DRAPE) ×3 IMPLANT
DRAPE X-RAY CASS 24X20 (DRAPES) IMPLANT
DRSG COVADERM 4X10 (GAUZE/BANDAGES/DRESSINGS) IMPLANT
DRSG COVADERM 4X8 (GAUZE/BANDAGES/DRESSINGS) IMPLANT
DRSG OPSITE 11X17.75 LRG (GAUZE/BANDAGES/DRESSINGS) ×4 IMPLANT
ELECT REM PT RETURN 9FT ADLT (ELECTROSURGICAL) ×3
ELECTRODE REM PT RTRN 9FT ADLT (ELECTROSURGICAL) ×2 IMPLANT
EVACUATOR SILICONE 100CC (DRAIN) IMPLANT
GLOVE BIO SURGEON STRL SZ 6.5 (GLOVE) ×4 IMPLANT
GLOVE BIO SURGEON STRL SZ7.5 (GLOVE) ×3 IMPLANT
GLOVE BIOGEL PI IND STRL 7.0 (GLOVE) ×4 IMPLANT
GLOVE BIOGEL PI IND STRL 7.5 (GLOVE) ×1 IMPLANT
GLOVE BIOGEL PI INDICATOR 7.0 (GLOVE) ×4
GLOVE BIOGEL PI INDICATOR 7.5 (GLOVE) ×1
GLOVE ECLIPSE 6.5 STRL STRAW (GLOVE) ×2 IMPLANT
GOWN PREVENTION PLUS XLARGE (GOWN DISPOSABLE) ×3 IMPLANT
GOWN STRL NON-REIN LRG LVL3 (GOWN DISPOSABLE) ×9 IMPLANT
KIT BASIN OR (CUSTOM PROCEDURE TRAY) ×3 IMPLANT
KIT ROOM TURNOVER OR (KITS) ×3 IMPLANT
MARKER GRAFT CORONARY BYPASS (MISCELLANEOUS) IMPLANT
NS IRRIG 1000ML POUR BTL (IV SOLUTION) ×6 IMPLANT
PACK GENERAL/GYN (CUSTOM PROCEDURE TRAY) ×3 IMPLANT
PACK PERIPHERAL VASCULAR (CUSTOM PROCEDURE TRAY) ×3 IMPLANT
PACK UNIVERSAL I (CUSTOM PROCEDURE TRAY) ×3 IMPLANT
PAD ARMBOARD 7.5X6 YLW CONV (MISCELLANEOUS) ×6 IMPLANT
PADDING CAST COTTON 6X4 STRL (CAST SUPPLIES) IMPLANT
SET COLLECT BLD 21X3/4 12 (NEEDLE) IMPLANT
SPONGE GAUZE 4X4 12PLY (GAUZE/BANDAGES/DRESSINGS) ×3 IMPLANT
SPONGE SURGIFOAM ABS GEL 100 (HEMOSTASIS) IMPLANT
STAPLER VISISTAT 35W (STAPLE) IMPLANT
STOPCOCK 4 WAY LG BORE MALE ST (IV SETS) IMPLANT
SUT ETHILON 3 0 PS 1 (SUTURE) IMPLANT
SUT PROLENE 5 0 C 1 24 (SUTURE) ×3 IMPLANT
SUT PROLENE 6 0 CC (SUTURE) ×3 IMPLANT
SUT PROLENE 7 0 BV 1 (SUTURE) IMPLANT
SUT PROLENE 7 0 BV1 MDA (SUTURE) IMPLANT
SUT SILK 2 0 SH (SUTURE) ×3 IMPLANT
SUT SILK 3 0 (SUTURE)
SUT SILK 3-0 18XBRD TIE 12 (SUTURE) IMPLANT
SUT VIC AB 2-0 CTX 36 (SUTURE) ×6 IMPLANT
SUT VIC AB 3-0 SH 27 (SUTURE) ×6
SUT VIC AB 3-0 SH 27X BRD (SUTURE) ×4 IMPLANT
SUT VIC AB 4-0 PS2 27 (SUTURE) IMPLANT
SUT VICRYL 4-0 PS2 18IN ABS (SUTURE) IMPLANT
TAPE UMBILICAL COTTON 1/8X30 (MISCELLANEOUS) IMPLANT
TOWEL OR 17X24 6PK STRL BLUE (TOWEL DISPOSABLE) ×6 IMPLANT
TOWEL OR 17X26 10 PK STRL BLUE (TOWEL DISPOSABLE) ×6 IMPLANT
TRAY FOLEY CATH 16FRSI W/METER (SET/KITS/TRAYS/PACK) ×3 IMPLANT
TUBING EXTENTION W/L.L. (IV SETS) IMPLANT
UNDERPAD 30X30 INCONTINENT (UNDERPADS AND DIAPERS) ×3 IMPLANT
WATER STERILE IRR 1000ML POUR (IV SOLUTION) ×3 IMPLANT

## 2013-02-11 NOTE — Progress Notes (Signed)
Has redness to left wrist, no open areas.  Restraints off for ROM and skin check. Replaced, continue to watch.  Resting.

## 2013-02-11 NOTE — Progress Notes (Signed)
PT Cancellation Note  Patient Details Name: [name] MRN: [id number] DOB: July 12, 1945   Cancelled Treatment:    Reason Eval/Treat Not Completed: Patient at procedure or test/unavailable. Pt in OR for I&D of wound at groin. PT to return as able.   Marcene Brawn 02/11/2013, 2:37 PM

## 2013-02-11 NOTE — Anesthesia Procedure Notes (Signed)
Procedure Name: Intubation Date/Time: 02/11/2013 12:34 PM Performed by: Rogelia Boga Pre-anesthesia Checklist: Patient identified, Emergency Drugs available, Suction available, Patient being monitored and Timeout performed Patient Re-evaluated:Patient Re-evaluated prior to inductionOxygen Delivery Method: Circle system utilized Preoxygenation: Pre-oxygenation with 100% oxygen Intubation Type: IV induction Ventilation: Mask ventilation without difficulty and Oral airway inserted - appropriate to patient size Laryngoscope Size: Mac and 4 Grade View: Grade I Tube type: Subglottic suction tube Number of attempts: 1 Airway Equipment and Method: Stylet Placement Confirmation: ETT inserted through vocal cords under direct vision,  positive ETCO2 and breath sounds checked- equal and bilateral Secured at: 20 cm Tube secured with: Tape Dental Injury: Teeth and Oropharynx as per pre-operative assessment

## 2013-02-11 NOTE — Anesthesia Preprocedure Evaluation (Addendum)
Anesthesia Evaluation  Patient identified by MRN, date of birth, ID bandGeneral Assessment Comment:Sedated. Responds to stimulation.  Reviewed: Allergy & Precautions, H&P , NPO status , Patient's Chart, lab work & pertinent test results, reviewed documented beta blocker date and time   History of Anesthesia Complications (+) PROLONGED EMERGENCE  Airway       Dental no notable dental hx. (+) Edentulous Upper, Partial Lower and Dental Advisory Given   Pulmonary shortness of breath, asthma ,  + rhonchi   Pulmonary exam normal       Cardiovascular hypertension, On Home Beta Blockers and On Medications + CAD, + Past MI, + Peripheral Vascular Disease and +CHF Rhythm:Regular Rate:Normal     Neuro/Psych PSYCHIATRIC DISORDERS Depression CVA    GI/Hepatic Neg liver ROS, GERD-  Medicated and Controlled,  Endo/Other  diabetes, Type 1, Insulin Dependent  Renal/GU Renal disease  negative genitourinary   Musculoskeletal   Abdominal   Peds  Hematology negative hematology ROS (+)   Anesthesia Other Findings   Reproductive/Obstetrics negative OB ROS                          Anesthesia Physical Anesthesia Plan  ASA: IV  Anesthesia Plan: General   Post-op Pain Management:    Induction: Intravenous  Airway Management Planned: Oral ETT  Additional Equipment:   Intra-op Plan:   Post-operative Plan: Extubation in OR and Possible Post-op intubation/ventilation  Informed Consent: I have reviewed the patients History and Physical, chart, labs and discussed the procedure including the risks, benefits and alternatives for the proposed anesthesia with the patient or authorized representative who has indicated his/her understanding and acceptance.   Dental advisory given  Plan Discussed with: CRNA  Anesthesia Plan Comments:         Anesthesia Quick Evaluation

## 2013-02-11 NOTE — Op Note (Signed)
Procedure: Debridement right groin Preoperative diagnosis: Poorly healing right groin wound  Postoperative diagnosis: Same  Anesthesia: Gen.  Asst: Doreatha Massed PA-C  Operative findings: #1 cultures taken right groin #2 necrotic fat soft tissue and anterior abdominal wall fascia #3 no obvious exposed graft  Operative details: After obtaining informed consent, the patient was taken to the operating room. The patient was placed in supine position on the operating table. After induction of general anesthesia and endotracheal intubation, the patient's right and left lower extremities were prepped and draped in the usual sterile fashion. The right groin was debrided sharply with a scalpel. There was a large amount of fibrinous exudate and necrotic fat. The fascia on the anterior abdominal wall was necrotic. All of the groin was debrided back to tissue that appeared healthy. I did not attempt to expose the graft and the base of the wound. However I did clean all necrotic tissue out down to this level. The groin wound was thoroughly irrigated with normal saline solution. A wound VAC was then applied to the right groin. The patient tolerated the procedure well and there were no complications. Instrument sponge and needle counts were correct at the end of the case. The Patient was extubated in the operating room and taken to recovery in stable condition.  Fabienne Bruns, MD [facility] [place] Office: [phone] Pager: [phone]

## 2013-02-11 NOTE — Anesthesia Postprocedure Evaluation (Signed)
  Anesthesia Post-op Note  Patient: [name]  Procedure(s) Performed: Procedure(s): GROIN DEBRIDEMENT & WOUND VAC APPLICATION (Right)  Patient Location: PACU  Anesthesia Type:General  Level of Consciousness: sedated  Airway and Oxygen Therapy: Patient Spontanous Breathing and Patient connected to face mask oxygen  Post-op Pain: none  Post-op Assessment: Post-op Vital signs reviewed, Patient's Cardiovascular Status Stable, Respiratory Function Stable, Patent Airway, No signs of Nausea or vomiting and Pain level controlled  Post-op Vital Signs: Reviewed and stable  Complications: No apparent anesthesia complications  Patient was maintained on vent due to obtundation pre-induction and poor cardiac reserves. Possible post-op vent was part of the pre-op concerns.

## 2013-02-11 NOTE — Progress Notes (Signed)
Back from PACU, wound vac draining bloody secretions.  Patient without complaints.  Continue to watch.

## 2013-02-11 NOTE — Transfer of Care (Signed)
Immediate Anesthesia Transfer of Care Note  Patient: [name]  Procedure(s) Performed: Procedure(s): GROIN DEBRIDEMENT & WOUND VAC APPLICATION (Right)  Patient Location: PACU  Anesthesia Type:General  Level of Consciousness: awake, responds to stimulation and Patient remains intubated per anesthesia plan  Airway & Oxygen Therapy: Patient placed on Ventilator (see vital sign flow sheet for setting)  Post-op Assessment: Report given to PACU RN and Post -op Vital signs reviewed and stable  Post vital signs: Reviewed and stable  Complications: No apparent anesthesia complications

## 2013-02-11 NOTE — Progress Notes (Signed)
OT CANCELLATION  Pt in surgical procedure. Will continue as appropriate. Thank you.  [facility], OTR/L  [phone] 02/11/2013

## 2013-02-11 NOTE — Progress Notes (Signed)
Admit: 02/06/2013 LOS: 5  [name] w/ PVD s/p Fem-Pop bypass 9/16 c/b by wound infection s/p OR debriedment 10/3 and known CKD (BL SCr 1.5) with AKI likely ATN  Subjective:  Continued confusion overnight and today Inc WOB, needing 4L O2 with SpO2 in low 90s.   On for OR today COPD at home, using LABA/ICS.  No albuterol so far here  10/07 0701 - 10/08 0700 In: 1060 [I.V.:600; NG/GT:360; IV Piggyback:100] Out: 2040 [Urine:1950; Drains:90]  Filed Weights   02/09/13 0400 02/10/13 0500 02/10/13 0800  Weight: 73.3 kg (161 lb 9.6 oz) 75.1 kg (165 lb 9.1 oz) 78.4 kg (172 lb 13.5 oz)    Current meds: reviewed including Vanc, Zosyn, LR @ 50/hr (stopped), Lasix 80 IV BID (started) Current Labs: reviewed, complement levels normal  02/11/13: pCXR reviewed by me, suggests pulmonary edema  Physical Exam:  Blood pressure 164/97, pulse 101, temperature 98.1 F (36.7 C), temperature source Oral, resp. rate 35, height 5' (1.524 m), weight 78.4 kg (172 lb 13.5 oz), SpO2 92.00%. Mild/Moderate tachypnea, remains with eyes closed ENT: NGT in place EYES: EOMI CV:RRR, nl s1s2 LUNGS: scattered wheezing, rhonchrous bs  EXT: no LEE ABD: soft, nontender SKIN bandages noted.  Assessment/Plan 1. AoCKD: Likely ATN.  Atheroembolic disease considered but no eosinophilia on CBC, complement levels normal.  Labs pending for this AM.   2. Acidosis: NaHCO3 normalized.  Stop all IVFs given respiratory problems 3. Hypoxia, Tachypnea: Will treat for COPD with scheduled A/A nebs and prn albuterol as well.  pCXR suggests some edema and have given lasix 80 IV BID, LR stopped.  On broad coverage ABX.   4. S/p F-P bypass and inguinal wound infection; per VVS.  On vanc/zosyn   Sabra Heck MD 02/11/2013, 11:04 AM   Recent Labs Lab 02/08/13 0525 02/09/13 0820 02/10/13 0440  NA 140 137 141  K 4.9 4.0 3.8  CL 105 96 97  CO2 17* 28 29  GLUCOSE 143* 311* 234*  BUN 95* 90* 80*  CREATININE 4.73* 4.52* 3.76*  CALCIUM 8.4  7.9* 7.8*  PHOS  --   --  6.3*    Recent Labs Lab 02/07/13 0120 02/08/13 0525 02/10/13 0440  WBC 18.5* 18.3* 12.9*  NEUTROABS  --   --  10.9*  HGB 11.7* 10.9* 10.8*  HCT 34.2* 32.7* 31.4*  MCV 88.4 90.1 86.5  PLT 189 173 179    Current Facility-Administered Medications  Medication Dose Route Frequency Provider Last Rate Last Dose  . acetaminophen (TYLENOL) tablet 325-650 mg  325-650 mg Oral Q4H PRN Ames Coupe Rhyne, PA-C   650 mg at 02/09/13 0831   Or  . acetaminophen (TYLENOL) suppository 325-650 mg  325-650 mg Rectal Q4H PRN Samantha J Rhyne, PA-C      . albuterol (PROVENTIL HFA;VENTOLIN HFA) 108 (90 BASE) MCG/ACT inhaler 2 puff  2 puff Inhalation Q6H PRN Samantha J Rhyne, PA-C      . ipratropium (ATROVENT) nebulizer solution 0.5 mg  0.5 mg Nebulization Q4H Arita Miss, MD   0.5 mg at 02/11/13 1026   And  . albuterol (PROVENTIL) (5 MG/ML) 0.5% nebulizer solution 2.5 mg  2.5 mg Nebulization Q4H Arita Miss, MD   2.5 mg at 02/11/13 1026  . albuterol (PROVENTIL) (5 MG/ML) 0.5% nebulizer solution 2.5 mg  2.5 mg Nebulization Q2H PRN Arita Miss, MD      . ALPRAZolam Prudy Feeler) tablet 0.5 mg  0.5 mg Oral Daily PRN Ames Coupe Rhyne, PA-C   0.5  mg at 02/10/13 1039  . aspirin chewable tablet 81 mg  81 mg Oral Daily Samantha J Rhyne, PA-C   81 mg at 02/10/13 1032  . atorvastatin (LIPITOR) tablet 20 mg  20 mg Oral q1800 Samantha J Rhyne, PA-C   20 mg at 02/10/13 1709  . budesonide-formoterol (SYMBICORT) 160-4.5 MCG/ACT inhaler 2 puff  2 puff Inhalation BID Ames Coupe Rhyne, PA-C   2 puff at 02/10/13 2102  . docusate sodium (COLACE) capsule 100 mg  100 mg Oral Daily Samantha J Rhyne, PA-C   100 mg at 02/10/13 1032  . DOPamine (INTROPIN) 800 mg in dextrose 5 % 250 mL infusion  3-5 mcg/kg/min Intravenous Continuous Samantha J Rhyne, PA-C      . DULoxetine (CYMBALTA) DR capsule 60 mg  60 mg Oral Daily Samantha J Rhyne, PA-C   60 mg at 02/10/13 1032  . enoxaparin (LOVENOX) injection 30 mg   30 mg Subcutaneous Q24H Samantha J Rhyne, PA-C   30 mg at 02/10/13 1032  . feeding supplement (ENSURE) pudding 1 Container  1 Container Oral TID BM Ailene Ards, RD   1 Container at 02/09/13 1400  . feeding supplement (JEVITY 1.2 CAL) liquid 1,000 mL  1,000 mL Per Tube Continuous Ailene Ards, RD   1,000 mL at 02/10/13 2216  . feeding supplement (PRO-STAT SUGAR FREE 64) liquid 30 mL  30 mL Per Tube TID Ailene Ards, RD   30 mL at 02/10/13 2119  . furosemide (LASIX) injection 80 mg  80 mg Intravenous BID Arita Miss, MD   80 mg at 02/11/13 1007  . guaiFENesin-dextromethorphan (ROBITUSSIN DM) 100-10 MG/5ML syrup 15 mL  15 mL Oral Q4H PRN Samantha J Rhyne, PA-C      . hydrALAZINE (APRESOLINE) injection 10 mg  10 mg Intravenous Q2H PRN Ames Coupe Rhyne, PA-C   10 mg at 02/11/13 0005  . insulin aspart (novoLOG) injection 0-9 Units  0-9 Units Subcutaneous TID WC Ames Coupe Rhyne, PA-C   3 Units at 02/11/13 0853  . insulin glargine (LANTUS) injection 28 Units  28 Units Subcutaneous Daily Ames Coupe Rhyne, PA-C   28 Units at 02/10/13 1033  . labetalol (NORMODYNE,TRANDATE) injection 10 mg  10 mg Intravenous Q2H PRN Samantha J Rhyne, PA-C      . metoprolol (LOPRESSOR) injection 2-5 mg  2-5 mg Intravenous Q2H PRN Samantha J Rhyne, PA-C      . nitroGLYCERIN (NITROSTAT) SL tablet 0.4 mg  0.4 mg Sublingual Q5 min PRN Samantha J Rhyne, PA-C      . ondansetron (ZOFRAN) injection 4 mg  4 mg Intravenous Q6H PRN Samantha J Rhyne, PA-C      . pantoprazole (PROTONIX) EC tablet 40 mg  40 mg Oral Daily Samantha J Rhyne, PA-C   40 mg at 02/10/13 1032  . phenol (CHLORASEPTIC) mouth spray 1 spray  1 spray Mouth/Throat PRN Samantha J Rhyne, PA-C      . piperacillin-tazobactam (ZOSYN) IVPB 2.25 g  2.25 g Intravenous Q8H Sherren Kerns, MD   2.25 g at 02/11/13 0412  . vancomycin (VANCOCIN) IVPB 1000 mg/200 mL premix  1,000 mg Intravenous Q48H Hilario Quarry [place], RPH   1,000 mg at 02/11/13 [phone]

## 2013-02-12 ENCOUNTER — Encounter (HOSPITAL_COMMUNITY)
Admission: RE | Disposition: A | Discharge status: Home Health | Payer: Self-pay | Source: Ambulatory Visit | Attending: Vascular Surgery

## 2013-02-12 ENCOUNTER — Inpatient Hospital Stay (HOSPITAL_COMMUNITY): Payer: Medicare Other | Admitting: Anesthesiology

## 2013-02-12 ENCOUNTER — Inpatient Hospital Stay (HOSPITAL_COMMUNITY): Payer: Medicare Other

## 2013-02-12 ENCOUNTER — Encounter (HOSPITAL_COMMUNITY): Payer: Self-pay | Admitting: Anesthesiology

## 2013-02-12 ENCOUNTER — Encounter (HOSPITAL_COMMUNITY): Payer: Medicare Other | Admitting: Anesthesiology

## 2013-02-12 DIAGNOSIS — M7981 Nontraumatic hematoma of soft tissue: Secondary | ICD-10-CM

## 2013-02-12 HISTORY — PX: HEMATOMA EVACUATION: SHX5118

## 2013-02-12 LAB — CBC
HCT: 30.2 % — ABNORMAL LOW (ref 36.0–46.0)
Hemoglobin: 9.9 g/dL — ABNORMAL LOW (ref 12.0–15.0)
MCH: 29.6 pg (ref 26.0–34.0)
MCV: 90.4 fL (ref 78.0–100.0)
Platelets: 176 10*3/uL (ref 150–400)
RBC: 3.34 MIL/uL — ABNORMAL LOW (ref 3.87–5.11)
RDW: 14.3 % (ref 11.5–15.5)
WBC: 12.2 10*3/uL — ABNORMAL HIGH (ref 4.0–10.5)

## 2013-02-12 LAB — GLUCOSE, CAPILLARY
Glucose-Capillary: 205 mg/dL — ABNORMAL HIGH (ref 70–99)
Glucose-Capillary: 266 mg/dL — ABNORMAL HIGH (ref 70–99)
Glucose-Capillary: 322 mg/dL — ABNORMAL HIGH (ref 70–99)
Glucose-Capillary: 303 mg/dL — ABNORMAL HIGH (ref 70–99)
Glucose-Capillary: 295 mg/dL — ABNORMAL HIGH (ref 70–99)
Glucose-Capillary: 306 mg/dL — ABNORMAL HIGH (ref 70–99)

## 2013-02-12 LAB — BASIC METABOLIC PANEL
BUN: 81 mg/dL — ABNORMAL HIGH (ref 6–23)
CO2: 32 mEq/L (ref 19–32)
Calcium: 8.1 mg/dL — ABNORMAL LOW (ref 8.4–10.5)
Chloride: 100 mEq/L (ref 96–112)
GFR calc non Af Amer: 18 mL/min — ABNORMAL LOW (ref >90)
Glucose, Bld: 338 mg/dL — ABNORMAL HIGH (ref 70–99)
Potassium: 4.1 mEq/L (ref 3.5–5.1)
Sodium: 144 mEq/L (ref 135–145)

## 2013-02-12 LAB — HEMOGLOBIN AND HEMATOCRIT, BLOOD: Hemoglobin: 7.2 g/dL — ABNORMAL LOW (ref 12.0–15.0)

## 2013-02-12 SURGERY — EVACUATION HEMATOMA
Anesthesia: General | Site: Groin | Laterality: Right | Wound class: Contaminated

## 2013-02-12 MED ORDER — SODIUM CHLORIDE 0.9 % IR SOLN
Status: DC | PRN
  Administered 2013-02-12: 3000 mL

## 2013-02-12 MED ORDER — MORPHINE SULFATE 2 MG/ML IJ SOLN
INTRAMUSCULAR | Status: AC
  Administered 2013-02-12: 0.5 mg via INTRAVENOUS
  Filled 2013-02-12: qty 1

## 2013-02-12 MED ORDER — SUCCINYLCHOLINE CHLORIDE 20 MG/ML IJ SOLN
INTRAMUSCULAR | Status: DC | PRN
  Administered 2013-02-12: 60 mg via INTRAVENOUS

## 2013-02-12 MED ORDER — THROMBIN 20000 UNITS EX SOLR
OROMUCOSAL | Status: DC | PRN
  Administered 2013-02-12: 17:00:00 via TOPICAL

## 2013-02-12 MED ORDER — 0.9 % SODIUM CHLORIDE (POUR BTL) OPTIME
TOPICAL | Status: DC | PRN
  Administered 2013-02-12: 1000 mL

## 2013-02-12 MED ORDER — NEOSTIGMINE METHYLSULFATE 1 MG/ML IJ SOLN
INTRAMUSCULAR | Status: DC | PRN
  Administered 2013-02-12: 2 mg via INTRAVENOUS

## 2013-02-12 MED ORDER — FUROSEMIDE 10 MG/ML IJ SOLN
10.0000 mg | INTRAMUSCULAR | Status: DC
  Administered 2013-02-13: 10 mg via INTRAVENOUS

## 2013-02-12 MED ORDER — FENTANYL CITRATE 0.05 MG/ML IJ SOLN
INTRAMUSCULAR | Status: DC | PRN
  Administered 2013-02-12: 50 ug via INTRAVENOUS
  Administered 2013-02-12: 25 ug via INTRAVENOUS

## 2013-02-12 MED ORDER — ARTIFICIAL TEARS OP OINT
TOPICAL_OINTMENT | OPHTHALMIC | Status: DC | PRN
  Administered 2013-02-12: 1 via OPHTHALMIC

## 2013-02-12 MED ORDER — IPRATROPIUM BROMIDE 0.02 % IN SOLN
0.5000 mg | Freq: Four times a day (QID) | RESPIRATORY_TRACT | Status: DC
  Administered 2013-02-12: 0.5 mg via RESPIRATORY_TRACT
  Filled 2013-02-12: qty 2.5

## 2013-02-12 MED ORDER — IPRATROPIUM BROMIDE 0.02 % IN SOLN
0.5000 mg | Freq: Two times a day (BID) | RESPIRATORY_TRACT | Status: DC
  Administered 2013-02-13 – 2013-02-20 (×16): 0.5 mg via RESPIRATORY_TRACT
  Filled 2013-02-12 (×17): qty 2.5

## 2013-02-12 MED ORDER — ALBUTEROL SULFATE (5 MG/ML) 0.5% IN NEBU
2.5000 mg | INHALATION_SOLUTION | Freq: Four times a day (QID) | RESPIRATORY_TRACT | Status: DC
  Administered 2013-02-12: 2.5 mg via RESPIRATORY_TRACT
  Filled 2013-02-12: qty 0.5

## 2013-02-12 MED ORDER — INSULIN ASPART 100 UNIT/ML ~~LOC~~ SOLN
SUBCUTANEOUS | Status: DC | PRN
  Administered 2013-02-12: 4 [IU] via SUBCUTANEOUS

## 2013-02-12 MED ORDER — ETOMIDATE 2 MG/ML IV SOLN
INTRAVENOUS | Status: DC | PRN
  Administered 2013-02-12: 16 mg via INTRAVENOUS

## 2013-02-12 MED ORDER — INSULIN ASPART 100 UNIT/ML ~~LOC~~ SOLN
4.0000 [IU] | SUBCUTANEOUS | Status: DC
  Filled 2013-02-12: qty 0.04

## 2013-02-12 MED ORDER — GLYCOPYRROLATE 0.2 MG/ML IJ SOLN
INTRAMUSCULAR | Status: DC | PRN
  Administered 2013-02-12: 0.4 mg via INTRAVENOUS

## 2013-02-12 MED ORDER — THROMBIN 20000 UNITS EX SOLR
CUTANEOUS | Status: AC
  Filled 2013-02-12: qty 20000

## 2013-02-12 MED ORDER — FENTANYL CITRATE 0.05 MG/ML IJ SOLN: 25.0000 ug | INTRAMUSCULAR | Status: DC | PRN

## 2013-02-12 MED ORDER — ROCURONIUM BROMIDE 100 MG/10ML IV SOLN
INTRAVENOUS | Status: DC | PRN
  Administered 2013-02-12: 10 mg via INTRAVENOUS

## 2013-02-12 MED ORDER — ALBUTEROL SULFATE (5 MG/ML) 0.5% IN NEBU
2.5000 mg | INHALATION_SOLUTION | Freq: Two times a day (BID) | RESPIRATORY_TRACT | Status: DC
  Administered 2013-02-13 – 2013-02-20 (×16): 2.5 mg via RESPIRATORY_TRACT
  Filled 2013-02-12 (×17): qty 0.5

## 2013-02-12 MED ORDER — THROMBIN 20000 UNITS EX KIT
PACK | CUTANEOUS | Status: DC | PRN
  Administered 2013-02-12: 17:00:00 via TOPICAL

## 2013-02-12 MED ORDER — SODIUM CHLORIDE 0.9 % IV SOLN
INTRAVENOUS | Status: DC | PRN
  Administered 2013-02-12 (×2): via INTRAVENOUS

## 2013-02-12 MED ORDER — PHENYLEPHRINE HCL 10 MG/ML IJ SOLN
INTRAMUSCULAR | Status: DC | PRN
  Administered 2013-02-12 (×5): 80 ug via INTRAVENOUS

## 2013-02-12 MED ORDER — PROMETHAZINE HCL 25 MG/ML IJ SOLN: 6.2500 mg | INTRAMUSCULAR | Status: DC | PRN

## 2013-02-12 SURGICAL SUPPLY — 26 items
CANISTER SUCTION 2500CC (MISCELLANEOUS) ×3 IMPLANT
COVER SURGICAL LIGHT HANDLE (MISCELLANEOUS) ×3 IMPLANT
DRSG VAC ATS SM SENSATRAC (GAUZE/BANDAGES/DRESSINGS) ×2 IMPLANT
ELECT REM PT RETURN 9FT ADLT (ELECTROSURGICAL) ×3
ELECTRODE REM PT RTRN 9FT ADLT (ELECTROSURGICAL) ×2 IMPLANT
GLOVE BIOGEL PI IND STRL 6.5 (GLOVE) ×3 IMPLANT
GLOVE BIOGEL PI IND STRL 7.5 (GLOVE) ×3 IMPLANT
GLOVE BIOGEL PI INDICATOR 6.5 (GLOVE) ×3
GLOVE BIOGEL PI INDICATOR 7.5 (GLOVE) ×2
GLOVE ECLIPSE 6.5 STRL STRAW (GLOVE) ×2 IMPLANT
GLOVE SURG SS PI 7.5 STRL IVOR (GLOVE) ×5 IMPLANT
GOWN PREVENTION PLUS XXLARGE (GOWN DISPOSABLE) ×1 IMPLANT
GOWN STRL NON-REIN LRG LVL3 (GOWN DISPOSABLE) ×15 IMPLANT
HANDPIECE INTERPULSE COAX TIP (DISPOSABLE) ×3
KIT BASIN OR (CUSTOM PROCEDURE TRAY) ×3 IMPLANT
KIT ROOM TURNOVER OR (KITS) ×3 IMPLANT
NS IRRIG 1000ML POUR BTL (IV SOLUTION) ×4 IMPLANT
PACK GENERAL/GYN (CUSTOM PROCEDURE TRAY) ×3 IMPLANT
PAD ARMBOARD 7.5X6 YLW CONV (MISCELLANEOUS) ×6 IMPLANT
PAD NEG PRESSURE SENSATRAC (MISCELLANEOUS) ×2 IMPLANT
SET HNDPC FAN SPRY TIP SCT (DISPOSABLE) ×1 IMPLANT
SUT VIC AB 2-0 CT1 27 (SUTURE) ×3
SUT VIC AB 2-0 CT1 TAPERPNT 27 (SUTURE) ×1 IMPLANT
TOWEL OR 17X24 6PK STRL BLUE (TOWEL DISPOSABLE) ×6 IMPLANT
TOWEL OR 17X26 10 PK STRL BLUE (TOWEL DISPOSABLE) ×6 IMPLANT
WATER STERILE IRR 1000ML POUR (IV SOLUTION) ×4 IMPLANT

## 2013-02-12 NOTE — Progress Notes (Signed)
10/9  CBGs today  322-311 mg/dl   40/9  [phone] mg/dl Change CBGs to every 4 hours and Novolog SENITIVE correction scale to every 4 hours while on continuous tube feedings.  If CBGs continue to be greater than 180 mg/dl, add Novolog 3 units every 4 hours for tube feeding coverage.  Do not give tube feeding coverage if tube feedings stopped at any time.  Will continue to follow while in hospital.  Smith Mince RN BSN CDE

## 2013-02-12 NOTE — Progress Notes (Signed)
OT Cancellation Note  Patient Details Name: [name] MRN: [id number] DOB: 1945-09-25   Cancelled Treatment:    Reason Eval/Treat Not Completed: Medical issues which prohibited therapy. Pt actively bleeding from groin wound, is NPO in case they have to take her back to surgery.  Evette Georges [phone] 02/12/2013, 3:23 PM

## 2013-02-12 NOTE — Preoperative (Signed)
Beta Blockers   Reason not to administer Beta Blockers:Apparently not on beta blockers in several days

## 2013-02-12 NOTE — Progress Notes (Signed)
VVS Note:  Called to see pt for continued oozing BRB from left groin open wound.  The medial wall of the wound has had active ooze under the flap.    Pressure was held with some decrease in bleeding. It did not appear to be pulsatile arterial flow from graft.  Thrombus sheets x 2 placed in area of bleeding and dry dressing placed  Will recheck Hgb/HCT at 4 pm.  Pt has T&S. Will recheck wound this afternoon

## 2013-02-12 NOTE — Progress Notes (Signed)
Utilization review completed.  

## 2013-02-12 NOTE — Op Note (Signed)
OPERATIVE NOTE   PROCEDURE: 1. Right groin irrigation and mechanical debridement 2. Evacuation of right groin hematoma 3. Placement negative pressure dressing to right groin wound  PRE-OPERATIVE DIAGNOSIS: right groin bleeding  POST-OPERATIVE DIAGNOSIS: same as above   SURGEON: Leonides Sake, MD  ASSISTANT(S): Della Goo, [facility]   ANESTHESIA: general  ESTIMATED BLOOD LOSS: 50 cc  FINDING(S): 1. ~30 clot adherent to base of wound 2. Few bleeding points in subcutaneous tissue controlled with electrocautery 3. No active bleeding despite provocation with Pulsavac lavage 4. Palpable pulse in floor of wound without obvious graft in wound  SPECIMEN(S):  none  INDICATIONS:   [name] is a 67 y.o. female who presents with repeated active bleeding from the right groin.  Despite multiple interventions on the floor, the patient continued to have repeated bleeding episodes from the right groin.  I recommended returning to the operating room to get better evaluation of the right groin, as the patient was actively competing with management of the right groin wound on the floor.  Risks include but are not limited to: bleeding, infection, myocardial infarction, stroke, and need for additional procedures.  The family is aware and agrees to proceed.    DESCRIPTION: After obtaining full informed written consent, the patient was brought back to the operating room and placed supine upon the operating table.  The patient received IV antibiotics prior to induction.  After obtaining adequate anesthesia, the patient was prepped and draped in the standard fashion for: right femoropopliteal bypass removal.  I first evacuated clot from the right groin.  After evacuation of most of the clot, a few bleeding points in the subcutaneous tissue were controlled with electrocautery.  No obvious active bleeding was noted.  I obtained a Pulsavac and debrided the right groin with Pulsavac lavage with 1.5 L of normal  saline.  There were only a few point of bleeding provocated by this maneuver.  I controlled these spots with electrocautery.  I placed thrombin and gelfoam in the wound.  After a few minutes, I removed the gelfoam.  No active bleeding was noted.  I evaluated the floor of the wound and the graft was palpable deep but the floor appeared intact.  I obtained a small VAC sponge and fashioned it for the geometry of this right groin.  I placed the sponge in the wound and secured the VAC sponge in place with the adhesive strips.  A hole was cut in the center of the sponge and the lilypad was attached.  The lilypad was attached to the VAC pump and set to 75 continuous.  No leak was evident.  No bleeding in the [facility] circuit was noted.  COMPLICATIONS: none  CONDITION: stable  Leonides Sake, MD [facility] [place] Office: [phone] Pager: [phone]  02/12/2013, 5:28 PM

## 2013-02-12 NOTE — Clinical Documentation Improvement (Signed)
THIS DOCUMENT IS NOT A PERMANENT PART OF THE MEDICAL RECORD  Please update your documentation with the medical record to reflect your response to this query. If you need help knowing how to do this please call [phone].  02/12/13  To [place] [facility]Associates  In an effort to better capture your patient's severity of illness, reflect appropriate length of stay and utilization of resources, a review of the patient medical record has revealed the following indicators.    Based on your clinical judgment, please clarify and document in a progress note and/or discharge summary the clinical condition associated with the following supporting information:  In responding to this query please exercise your independent judgment.  The fact that a query is asked, does not imply that any particular answer is desired or expected.  Noted documentation of "confusion" and "delirious"  after surgery , requiring restraints.  If possible please provide a diagnosis for this condition if known.  Thank you   Possible Clinical Conditions?   Encephalopathy (describe type if known)                                             Metabolic/ Toxic  Drug induced confusion/delirium  Acute confusion  Acute delirium  Acute exacerbation of known dementia (indicate type)  Other Condition  Cannot Clinically Determine      :  Risk Factors: Acute Renal Failure, s/p Debridement of wound infection, Acidosis    Treatment: Restraints, IVF, Labs  Reviewed: additional documentation in the medical record  Thank [place] [place]  Clinical Documentation Specialist: [phone] Health Information Management [place]

## 2013-02-12 NOTE — OR Nursing (Signed)
Lar rep/ states the prior cbc drawn earlie, preop,r can not be located. CBC redrawn now and sent to lab

## 2013-02-12 NOTE — Transfer of Care (Signed)
Immediate Anesthesia Transfer of Care Note  Patient: [name]  Procedure(s) Performed: Procedure(s): EVACUATION HEMATOMA (Right)  Patient Location: PACU  Anesthesia Type:General  Level of Consciousness: confused  Airway & Oxygen Therapy: Patient Spontanous Breathing and Patient connected to face mask oxygen  Post-op Assessment: Report given to PACU RN and Post -op Vital signs reviewed and stable  Post vital signs: stable  Complications: No apparent anesthesia complications

## 2013-02-12 NOTE — Progress Notes (Signed)
NUTRITION FOLLOW UP  Intervention:    Continue current EN regimen RD to follow for nutrition care plan  Nutrition Dx:   Inadequate oral intake related now related to confusion, poor appetite as evidenced by need for EN support, ongoing  Goal:   EN to meet > 90% of estimated nutrition needs, met  Monitor:   EN regimen & tolerance, PO intake, weight, labs, I/O's  Assessment:   Patient with PMH of stroke and CABG; recently underwent right femoral to below-knee popliteal bypass on 9/16; began complaining of swelling in right groin and some redness; presented to the Vascular Surgery office with drainage from her right groin.   Patient s/p procedure 10/3:  DEBRIDEMENT RIGHT GROIN  PLACEMENT OF VAC DRESSING  Patient s/p procedure 10/8: DEBRIDEMENT RIGHT GROIN & WOUND VAC APPLICATION  Jevity 1.2 formula infusing at goal rate of 45 ml/hr via NGT with Prostat liquid protein 30 ml 3 times daily via tube providing 1596 kcals, 105 gm protein, 872 ml of free water.  Tolerating well.  Per VS note, plan to stop VAC for now given recent bleeding.  Height: Ht Readings from Last 1 Encounters:  02/06/13 5' (1.524 m)    Weight Status:   Wt Readings from Last 1 Encounters:  02/12/13 164 lb 0.4 oz (74.4 kg)    Re-estimated needs:  Kcal: 1500-1700 Protein: 100-110 gm Fluid: 1.5-1.7 L  Skin: wound VAC to right groin  Diet Order: NPO   Intake/Output Summary (Last 24 hours) at 02/12/13 1105 Last data filed at 02/12/13 0800  Gross per 24 hour  Intake   2075 ml  Output   1895 ml  Net    180 ml    Labs:   Recent Labs Lab 02/09/13 0820 02/10/13 0440 02/11/13 1123 02/12/13 0550  NA 137 141 144 144  K 4.0 3.8 3.5 4.1  CL 96 97 101 100  CO2 28 29 32 32  BUN 90* 80* 77* 81*  CREATININE 4.52* 3.76* 2.77* 2.65*  CALCIUM 7.9* 7.8* 8.4 8.1*  PHOS  --  6.3*  --   --   GLUCOSE 311* 234* 167* 338*    CBG (last 3)   Recent Labs  02/11/13 2345 02/12/13 0349 02/12/13 0746   GLUCAP 266* 322* 311*    Scheduled Meds: . albuterol  2.5 mg Nebulization Q6H   And  . ipratropium  0.5 mg Nebulization Q6H  . aspirin  81 mg Oral Daily  . atorvastatin  20 mg Oral q1800  . budesonide-formoterol  2 puff Inhalation BID  . docusate sodium  100 mg Oral Daily  . DULoxetine  60 mg Oral Daily  . feeding supplement (ENSURE)  1 Container Oral TID BM  . feeding supplement (PRO-STAT SUGAR FREE 64)  30 mL Per Tube TID  . furosemide  80 mg Intravenous BID  . insulin aspart  0-9 Units Subcutaneous TID WC  . insulin glargine  28 Units Subcutaneous Daily  . pantoprazole  40 mg Oral Daily  . piperacillin-tazobactam (ZOSYN)  IV  2.25 g Intravenous Q8H    Continuous Infusions: . feeding supplement (JEVITY 1.2 CAL) Stopped (02/11/13 0423)  . lactated ringers 50 mL/hr at 02/11/13 2000    Maureen Chatters, RD, LDN Pager #: [phone] After-Hours Pager #: [phone]

## 2013-02-12 NOTE — Progress Notes (Signed)
PT Cancellation Note  Patient Details Name: TIFFINEY SPARROW MRN: [id number] DOB: 1945/09/26   Cancelled Treatment:    Reason Eval/Treat Not Completed: Medical issues which prohibited therapy.  Pt with wound bleeding and RN request no movement at this time.  Will continue to follow.     Zairah Arista 02/12/2013, 3:01 PM  Jake Shark, PT DPT [phone]

## 2013-02-12 NOTE — Progress Notes (Signed)
Lelon Mast, PA,  in to see patient. Placed dressing to right groin.  Dressing now dry and intact.

## 2013-02-12 NOTE — Progress Notes (Addendum)
Call placed to [facility], PA-C, due to dressing saturated with bright red blood and clots.  Will see patient.

## 2013-02-12 NOTE — Progress Notes (Signed)
Back from surgery, no complaints, npwt in place a 28mm/hg.  Feeding tube out. Will try to reinsert.

## 2013-02-12 NOTE — Progress Notes (Signed)
Dr. Darrick Penna aware of bleeding right groin and PA's will see patient.  Now still with increase bleeding right groin and second dressing change done. Continue to monitor.

## 2013-02-12 NOTE — Progress Notes (Signed)
Admit: 02/06/2013 LOS: 6  50F w/ PVD s/p Fem-Pop bypass 9/16 c/b by wound infection s/p OR debriedment 10/3 and known CKD (BL SCr 1.5) with AKI likely ATN (recovering)  Subjective:  Had wound debridement in OR yesterday, too much bleeding for wound vac Breathng more comfortably Remains confused, oriented to self only On enteral nutrition  10/08 0701 - 10/09 0700 In: 2130 [I.V.:1400; NG/GT:630; IV Piggyback:100] Out: 1695 [Urine:1325; Drains:350; Blood:20]  Filed Weights   02/10/13 0500 02/10/13 0800 02/12/13 0500  Weight: 75.1 kg (165 lb 9.1 oz) 78.4 kg (172 lb 13.5 oz) 74.4 kg (164 lb 0.4 oz)    Current meds: reviewed including Vanc, Zosyn, LR @ 50/hr, Lasix 80 IV BID, A&A nebs standing and scheduled Current Labs: reviewed, complement levels were normal, no eosinophilia when checked.  Physical Exam:  Blood pressure 138/63, pulse 84, temperature 97.7 F (36.5 C), temperature source Oral, resp. rate 17, height 5' (1.524 m), weight 74.4 kg (164 lb 0.4 oz), SpO2 99.00%. Ill appearing, breathing more comfortably ENT: NGT in place EYES: EOMI CV:RRR, nl s1s2 LUNGS: scattered wheezing, rhonchrous bs  EXT: no LEE ABD: soft, nontender SKIN bandages noted. NEURO: oriented to self only.    Assessment/Plan 1. AoCKD: Likely ATN.  Atheroembolic disease considered but no eosinophilia on CBC, complement levels normal.  Seems to be recovering, stable electrolytes,  Good UOP.  Cont lasix for volume control now.   2. Acidosis: NaHCO3 normalized.  3. Hypoxia, Tachypnea: Now on scheduled scheduled A/A nebs and prn albuterol as well.  pCXR suggested some edema and on lasix 80 IV BID.  On broad coverage ABX.   4. S/p F-P bypass and inguinal wound infection; per VVS.   5. Delirium: agree with holding CNS acting meds.  Somewhati mproved this AM.     Sabra Heck MD 02/12/2013, 9:42 AM   Recent Labs Lab 02/09/13 0820 02/10/13 0440 02/11/13 1123 02/12/13 0550  NA 137 141 144 144  K 4.0 3.8  3.5 4.1  CL 96 97 101 100  CO2 28 29 32 32  GLUCOSE 311* 234* 167* 338*  BUN 90* 80* 77* 81*  CREATININE 4.52* 3.76* 2.77* 2.65*  CALCIUM 7.9* 7.8* 8.4 8.1*  PHOS  --  6.3*  --   --     Recent Labs Lab 02/10/13 0440 02/11/13 1123 02/12/13 0550  WBC 12.9* 15.0* 12.2*  NEUTROABS 10.9*  --   --   HGB 10.8* 11.0* 9.9*  HCT 31.4* 33.4* 30.2*  MCV 86.5 90.0 90.4  PLT 179 187 176    Current Facility-Administered Medications  Medication Dose Route Frequency Provider Last Rate Last Dose  . acetaminophen (TYLENOL) tablet 325-650 mg  325-650 mg Oral Q4H PRN Ames Coupe Rhyne, PA-C   650 mg at 02/12/13 0417   Or  . acetaminophen (TYLENOL) suppository 325-650 mg  325-650 mg Rectal Q4H PRN Samantha J Rhyne, PA-C      . albuterol (PROVENTIL HFA;VENTOLIN HFA) 108 (90 BASE) MCG/ACT inhaler 2 puff  2 puff Inhalation Q6H PRN Samantha J Rhyne, PA-C      . albuterol (PROVENTIL) (5 MG/ML) 0.5% nebulizer solution 2.5 mg  2.5 mg Nebulization Q2H PRN Arita Miss, MD      . albuterol (PROVENTIL) (5 MG/ML) 0.5% nebulizer solution 2.5 mg  2.5 mg Nebulization Q6H Arita Miss, MD       And  . ipratropium (ATROVENT) nebulizer solution 0.5 mg  0.5 mg Nebulization Q6H Arita Miss, MD      .  aspirin chewable tablet 81 mg  81 mg Oral Daily Samantha J Rhyne, PA-C   81 mg at 02/10/13 1032  . atorvastatin (LIPITOR) tablet 20 mg  20 mg Oral q1800 Samantha J Rhyne, PA-C   20 mg at 02/10/13 1709  . budesonide-formoterol (SYMBICORT) 160-4.5 MCG/ACT inhaler 2 puff  2 puff Inhalation BID Ames Coupe Rhyne, PA-C   2 puff at 02/10/13 2102  . docusate sodium (COLACE) capsule 100 mg  100 mg Oral Daily Samantha J Rhyne, PA-C   100 mg at 02/10/13 1032  . DULoxetine (CYMBALTA) DR capsule 60 mg  60 mg Oral Daily Samantha J Rhyne, PA-C   60 mg at 02/10/13 1032  . feeding supplement (ENSURE) pudding 1 Container  1 Container Oral TID BM Ailene Ards, RD   1 Container at 02/09/13 1400  . feeding supplement (JEVITY  1.2 CAL) liquid 1,000 mL  1,000 mL Per Tube Continuous Ailene Ards, RD   1,000 mL at 02/10/13 2216  . feeding supplement (PRO-STAT SUGAR FREE 64) liquid 30 mL  30 mL Per Tube TID Ailene Ards, RD   30 mL at 02/11/13 2219  . furosemide (LASIX) injection 80 mg  80 mg Intravenous BID Arita Miss, MD   80 mg at 02/12/13 0741  . guaiFENesin-dextromethorphan (ROBITUSSIN DM) 100-10 MG/5ML syrup 15 mL  15 mL Oral Q4H PRN Samantha J Rhyne, PA-C      . hydrALAZINE (APRESOLINE) injection 10 mg  10 mg Intravenous Q2H PRN Ames Coupe Rhyne, PA-C   10 mg at 02/11/13 0005  . insulin aspart (novoLOG) injection 0-9 Units  0-9 Units Subcutaneous TID WC Ames Coupe Rhyne, PA-C   7 Units at 02/12/13 0747  . insulin glargine (LANTUS) injection 28 Units  28 Units Subcutaneous Daily Ames Coupe Rhyne, PA-C   28 Units at 02/10/13 1033  . labetalol (NORMODYNE,TRANDATE) injection 10 mg  10 mg Intravenous Q2H PRN Samantha J Rhyne, PA-C      . lactated ringers infusion   Intravenous Continuous Pryor Ochoa, MD 50 mL/hr at 02/11/13 2000    . metoprolol (LOPRESSOR) injection 2-5 mg  2-5 mg Intravenous Q2H PRN Samantha J Rhyne, PA-C      . nitroGLYCERIN (NITROSTAT) SL tablet 0.4 mg  0.4 mg Sublingual Q5 min PRN Samantha J Rhyne, PA-C      . ondansetron (ZOFRAN) injection 4 mg  4 mg Intravenous Q6H PRN Samantha J Rhyne, PA-C      . pantoprazole (PROTONIX) EC tablet 40 mg  40 mg Oral Daily Samantha J Rhyne, PA-C   40 mg at 02/10/13 1032  . phenol (CHLORASEPTIC) mouth spray 1 spray  1 spray Mouth/Throat PRN Samantha J Rhyne, PA-C      . piperacillin-tazobactam (ZOSYN) IVPB 2.25 g  2.25 g Intravenous Q8H Sherren Kerns, MD   2.25 g at 02/12/13 [id number]

## 2013-02-12 NOTE — Progress Notes (Addendum)
[facility] [place]  Subjective  - less confused, knows name and that she is at [facility]   Objective 138/63 84 97.9 F (36.6 C) (Oral) 17 99%  Intake/Output Summary (Last 24 hours) at 02/12/13 0745 Last data filed at 02/12/13 0700  Gross per 24 hour  Intake   2130 ml  Output   1695 ml  Net    435 ml   Right groin with bleeding last pm.  VAC stopped, wound overall clean this am but with large amount of fresh hematoma in wound, most removed without active bleeding  Right foot pink and warm ulcerations healing  Assessment/Planning: S/p groin debridement yesterday will stop VAC for now with recent bleeding,  Hydrogel dressings q12h.  Reconsider VAC if no further ooze.  Still at very high risk for anastomotic breakdown and graft infection.  Diabetes still poorly controlled with multiple elevations greater than 300.  Diabetes coordinator following  Confusion multifactorial secondary to infection meds renal dysfunction malnutrition hopefully improve as overall clincal condition improves  Renal failure improving creatinine 2.6 moving closer to baseline (1.5)  Leukocytosis persists but improved original culture serratia, recultured yesterday, continue Zosyn, stop Vanc  Severe protein calorie malnutrition, encourage PO intake but continue tube feeds for full nutrition support.  Check albumin and prealbumin tomorrow  Blood loss anemia follow for now.  Will send type and screen today in case blood eventually needed.   DVT prophylaxis no Lovenox or heparin for now with recent bleeding.  SCDs only.  FIELDS,CHARLES E 02/12/2013 7:45 AM --  Laboratory Lab Results:  Recent Labs  02/11/13 1123 02/12/13 0550  WBC 15.0* 12.2*  HGB 11.0* 9.9*  HCT 33.4* 30.2*  PLT 187 176   BMET  Recent Labs  02/11/13 1123 02/12/13 0550  NA 144 144  K 3.5 4.1  CL 101 100  CO2 32 32  GLUCOSE 167* 338*  BUN 77* 81*  CREATININE 2.77* 2.65*  CALCIUM 8.4 8.1*     COAG Lab Results  Component Value Date   INR 1.09 01/13/2013   INR 1.03 04/16/2012   INR 1.07 05/17/2011   No results found for this basename: PTT

## 2013-02-12 NOTE — Interval H&P Note (Signed)
[facility] [place]  History and Physical Update  The patient was interviewed and re-examined.  The patient's previous History and Physical has been reviewed and is unchangedexcept for: interval multiple bleeding events.   Per Dr. Estanislado Spire exam previously, pt's wound cannot be adequately managed at bed side so right groin exploration in OR is needed.    Leonides Sake, MD [facility] [place] Office: [phone] Pager: [phone]  02/12/2013, 4:33 PM

## 2013-02-12 NOTE — Progress Notes (Signed)
In to perform dressing change to right groin, oozing bright red blood.  Dressing change done and Dr. Darrick Penna notified.

## 2013-02-12 NOTE — Anesthesia Preprocedure Evaluation (Addendum)
Anesthesia Evaluation  Patient identified by MRN, date of birth, ID band Patient confused  General Assessment Comment:Sedated. Responds to stimulation.  Reviewed: Allergy & Precautions, H&P , NPO status , Patient's Chart, lab work & pertinent test results, reviewed documented beta blocker date and time   History of Anesthesia Complications (+) PROLONGED EMERGENCE and history of anesthetic complications  Airway Mallampati: I TM Distance: >3 FB Neck ROM: Full    Dental no notable dental hx. (+) Dental Advisory Given   Pulmonary shortness of breath, asthma ,  + rhonchi   Pulmonary exam normal       Cardiovascular hypertension, On Home Beta Blockers and On Medications + CAD, + Past MI, + Peripheral Vascular Disease and +CHF Rhythm:Regular Rate:Normal  ECHO from 2013 notetd with EF 15-20%   Neuro/Psych PSYCHIATRIC DISORDERS CVA    GI/Hepatic Neg liver ROS, GERD-  Medicated and Controlled,  Endo/Other  diabetes, Poorly Controlled, Type 2, Insulin Dependent  Renal/GU CRF     Musculoskeletal   Abdominal   Peds  Hematology negative hematology ROS (+)   Anesthesia Other Findings Dental advisory deferred.  Pt very confused.  Reproductive/Obstetrics negative OB ROS                          Anesthesia Physical Anesthesia Plan  ASA: IV and emergent  Anesthesia Plan: General ETT   Post-op Pain Management:    Induction: Intravenous  Airway Management Planned: Oral ETT  Additional Equipment:   Intra-op Plan:   Post-operative Plan: Possible Post-op intubation/ventilation  Informed Consent: I have reviewed the patients History and Physical, chart, labs and discussed the procedure including the risks, benefits and alternatives for the proposed anesthesia with the patient or authorized representative who has indicated his/her understanding and acceptance.   Dental advisory given  Plan Discussed  with: CRNA and Surgeon  Anesthesia Plan Comments:         Anesthesia Quick Evaluation

## 2013-02-13 ENCOUNTER — Encounter (HOSPITAL_COMMUNITY): Payer: Self-pay | Admitting: Vascular Surgery

## 2013-02-13 LAB — GLUCOSE, CAPILLARY
Glucose-Capillary: 174 mg/dL — ABNORMAL HIGH (ref 70–99)
Glucose-Capillary: 187 mg/dL — ABNORMAL HIGH (ref 70–99)
Glucose-Capillary: 233 mg/dL — ABNORMAL HIGH (ref 70–99)
Glucose-Capillary: 217 mg/dL — ABNORMAL HIGH (ref 70–99)
Glucose-Capillary: 276 mg/dL — ABNORMAL HIGH (ref 70–99)
Glucose-Capillary: 377 mg/dL — ABNORMAL HIGH (ref 70–99)

## 2013-02-13 LAB — COMPREHENSIVE METABOLIC PANEL
ALT: 12 U/L (ref 0–35)
Albumin: 1.9 g/dL — ABNORMAL LOW (ref 3.5–5.2)
Alkaline Phosphatase: 47 U/L (ref 39–117)
BUN: 77 mg/dL — ABNORMAL HIGH (ref 6–23)
Calcium: 8 mg/dL — ABNORMAL LOW (ref 8.4–10.5)
Potassium: 4.2 mEq/L (ref 3.5–5.1)
Sodium: 157 mEq/L — ABNORMAL HIGH (ref 135–145)
Total Protein: 5.4 g/dL — ABNORMAL LOW (ref 6.0–8.3)

## 2013-02-13 LAB — NA AND K (SODIUM & POTASSIUM), RAND UR
Potassium Urine: 15 mEq/L
Sodium, Ur: 108 mEq/L

## 2013-02-13 LAB — BASIC METABOLIC PANEL
BUN: 74 mg/dL — ABNORMAL HIGH (ref 6–23)
CO2: 35 mEq/L — ABNORMAL HIGH (ref 19–32)
Chloride: 96 mEq/L (ref 96–112)
Creatinine, Ser: 2.38 mg/dL — ABNORMAL HIGH (ref 0.50–1.10)
GFR calc Af Amer: 23 mL/min — ABNORMAL LOW (ref >90)
GFR calc non Af Amer: 20 mL/min — ABNORMAL LOW (ref >90)
Potassium: 3.8 mEq/L (ref 3.5–5.1)
Sodium: 141 mEq/L (ref 135–145)

## 2013-02-13 LAB — CBC
MCHC: 33.6 g/dL (ref 30.0–36.0)
Platelets: 155 10*3/uL (ref 150–400)
RDW: 14.8 % (ref 11.5–15.5)
WBC: 14.5 10*3/uL — ABNORMAL HIGH (ref 4.0–10.5)

## 2013-02-13 LAB — PREALBUMIN: Prealbumin: 11.9 mg/dL — ABNORMAL LOW (ref 17.0–34.0)

## 2013-02-13 LAB — SODIUM: Sodium: 137 mEq/L (ref 135–145)

## 2013-02-13 MED ORDER — FUROSEMIDE 10 MG/ML IJ SOLN
INTRAMUSCULAR | Status: AC
  Filled 2013-02-13: qty 4

## 2013-02-13 MED ORDER — PIPERACILLIN-TAZOBACTAM 3.375 G IVPB
3.3750 g | Freq: Three times a day (TID) | INTRAVENOUS | Status: DC
  Administered 2013-02-13 – 2013-02-17 (×12): 3.375 g via INTRAVENOUS
  Filled 2013-02-13 (×16): qty 50

## 2013-02-13 MED ORDER — DEXTROSE 5 % IV SOLN
INTRAVENOUS | Status: DC
  Administered 2013-02-13: 11:00:00 via INTRAVENOUS

## 2013-02-13 MED ORDER — FREE WATER: 250.0000 mL | Status: DC

## 2013-02-13 MED ORDER — FREE WATER
250.0000 mL | Freq: Four times a day (QID) | Status: DC
  Administered 2013-02-13 (×2): 250 mL

## 2013-02-13 MED ORDER — PANTOPRAZOLE SODIUM 40 MG PO PACK
40.0000 mg | PACK | Freq: Every day | ORAL | Status: DC
  Administered 2013-02-13 – 2013-02-14 (×2): 40 mg
  Filled 2013-02-13 (×3): qty 20

## 2013-02-13 MED ORDER — PANTOPRAZOLE SODIUM 40 MG IV SOLR
40.0000 mg | INTRAVENOUS | Status: DC
  Filled 2013-02-13: qty 40

## 2013-02-13 NOTE — Progress Notes (Signed)
ANTIBIOTIC CONSULT NOTE - Follow Up  Pharmacy Consult for Zosyn Indication: R groin abscess  Allergies  Allergen Reactions  . Sulfa Antibiotics Anaphylaxis and Shortness Of Breath  . Codeine Nausea Only and Other (See Comments)    Severe headache  . Ivp Dye [Iodinated Diagnostic Agents] Hives and Other (See Comments)    Severe anxiety  . Penicillins Swelling and Other (See Comments)    "huge sores". Pt has tolerated both Zosyn and Keflex in the past.    Patient Measurements: Height: 5' (152.4 cm) Weight: 160 lb 11.5 oz (72.9 kg) IBW/kg (Calculated) : 45.5  Vital Signs: Temp: 97.8 F (36.6 C) (10/10 0730) Temp src: Oral (10/10 0730) BP: 126/63 mmHg (10/10 0812) Pulse Rate: 82 (10/10 0812) Intake/Output from previous day: 10/09 0701 - 10/10 0700 In: 2930.4 [P.O.:80; I.V.:1080; Blood:950.4; NG/GT:720; IV Piggyback:100] Out: 1675 [Urine:1675] Intake/Output from this shift: Total I/O In: 180 [NG/GT:180] Out: 0   Labs:  Recent Labs  02/11/13 1123 02/12/13 0550 02/12/13 1806 02/13/13 0755  WBC 15.0* 12.2*  --  14.5*  HGB 11.0* 9.9* 7.2* 10.3*  PLT 187 176  --  155  CREATININE 2.77* 2.65*  --  2.51*   Estimated Creatinine Clearance: 19.7 ml/min (by C-G formula based on Cr of 2.51). No results found for this basename: VANCOTROUGH, VANCOPEAK, VANCORANDOM, GENTTROUGH, GENTPEAK, GENTRANDOM, TOBRATROUGH, TOBRAPEAK, TOBRARND, AMIKACINPEAK, AMIKACINTROU, AMIKACIN,  in the last 72 hours   Microbiology: Recent Results (from the past 720 hour(s))  WOUND CULTURE     Status: None   Collection Time    02/06/13 11:31 AM      Result Value Range Status   Specimen Description WOUND RIGHT GROIN   Final   Special Requests PT ON VANCOMYCIN   Final   Gram Stain     Final   Value: NO WBC SEEN     NO SQUAMOUS EPITHELIAL CELLS SEEN     MODERATE GRAM NEGATIVE RODS     Performed at [facility]   Culture     Final   Value: MODERATE SERRATIA MARCESCENS     Performed at [facility]   Report Status 02/09/2013 FINAL   Final   Organism ID, Bacteria SERRATIA MARCESCENS   Final  URINE CULTURE     Status: None   Collection Time    02/08/13  3:26 PM      Result Value Range Status   Specimen Description URINE, CLEAN CATCH   Final   Special Requests NONE   Final   Culture  Setup Time     Final   Value: 02/08/2013 23:11     Performed at [facility] Count     Final   Value: NO GROWTH     Performed at [facility]   Culture     Final   Value: NO GROWTH     Performed at [facility]   Report Status 02/10/2013 FINAL   Final  WOUND CULTURE     Status: None   Collection Time    02/11/13  1:07 PM      Result Value Range Status   Specimen Description WOUND RIGHT GROIN   Final   Special Requests     Final   Value: PATIENT ON FOLLOWING ZOSYN RIGHT GROIN WOUND FOR CULTURE   Gram Stain     Final   Value: FEW WBC PRESENT, PREDOMINANTLY PMN     NO SQUAMOUS EPITHELIAL CELLS SEEN  NO ORGANISMS SEEN     Performed at [facility]   Culture     Final   Value: FEW GRAM NEGATIVE RODS     Performed at [facility]   Report Status PENDING   Incomplete    Assessment: Pt on Zosyn Day #7 for R groin abscess. S/p OR debridement 10/3 and 10/9,Wounds are clean and healing well. WBC 14.5. Afeb. SCr trending down 4.29>>2.51 over 4 days  Vanc 10/3 >>10/9 Zosyn 10/3 >>  10/3 R groin abscess >> Sens Cefepime/Ceftaz/Roceph/Cipro/Gent/Tobra/Bact Resis Ancef/Cefoxitin 10/5 Urine>>neg 10/8 R groin abscess>>  Goal of Therapy:  Eradication of infection  Plan:  - Increase to Zosyn 3.375 gm IV q8h. - Could consider de-escalating abx to Cipro if repeat cultures are same or negative  - Monitor for changes in renal function.  Vinnie Level, PharmD.  [place] License #[id number] Application for [place] reciprocity pending  Clinical Pharmacist Pager [phone]  I agree with above.  Christoper Fabian, PharmD, BCPS Clinical pharmacist,  pager [phone] 02/13/2013  2:57 PM

## 2013-02-13 NOTE — Anesthesia Postprocedure Evaluation (Signed)
  Anesthesia Post-op Note  Patient: [name]  Procedure(s) Performed: Procedure(s): EVACUATION HEMATOMA (Right)  Patient Location: PACU  Anesthesia Type:General  Level of Consciousness: awake  Airway and Oxygen Therapy: Patient Spontanous Breathing  Post-op Pain: mild  Post-op Assessment: Post-op Vital signs reviewed  Post-op Vital Signs: stable  Complications: No apparent anesthesia complications

## 2013-02-13 NOTE — Clinical Documentation Improvement (Signed)
THIS DOCUMENT IS NOT A PERMANENT PART OF THE MEDICAL RECORD  Please update your documentation with the medical record to reflect your response to this query. If you need help knowing how to do this please call [phone].  02/13/13   To Dr. [facility],  In a better effort to capture your patient's severity of illness, reflect appropriate length of stay and utilization of resources, a review of the patient medical record has revealed the following indicators.    Based on your clinical judgment, please clarify and document in a progress note and/or discharge summary the clinical condition associated with the following supporting information:  In responding to this query please exercise your independent judgment.  The fact that a query is asked, does not imply that any particular answer is desired or expected. Because that is documentation in the medical record of "debridement" clarification is needed.    Op note from 02/06/13 and 02/11/13 if possible please specify if  this area was also debrided  "The fascia on the anterior abdominal wall was necrotic. " and what level was debrided down to per documentation "clean all necrotic tissue out down to this level" and the type of debridement excisional or non excisional.  Thank you    Please document the below (4) key elements in a progress note:  1.  Type of Debridement:          Excisional Debridement-  Cutting away necrotic, devitalized tissue or slough to  Level of viable tissue using a sharp instrument (example, scalpel, scissors, etc).           NON Excisional Debridement-  The removal of necrotic, devitalized tissue or slough by means of scraping, mechanical brushing, flushing, or washing. (example: irrigation, whirlpool);  Minor removal of loose fragments   3.  Please document depth of the debridement:             Partial Thickness  Full Thickness  Skin and Subcutaneous Tissue  Subcutaneous Tissue and  Muscle  Subcutaneous Tissue, Muscle and Bone  Other   You may use possible, probable, or suspect with inpatient documentation. possible, probable, suspected diagnoses MUST be documented at the time of discharge  Reviewed: per phone call from nurse at Dr. Elise Benne office stating he had addressed the query and had no additional documentation to add  Thank Claud Kelp Lindia Garms  Clinical Documentation Specialist: [phone] Health Information Management [place]

## 2013-02-13 NOTE — Progress Notes (Signed)
Admit: 02/06/2013 LOS: 7  58F w/ PVD s/p Fem-Pop bypass 9/16 c/b by wound infection s/p OR debriedment 10/3 and known CKD (BL SCr 1.5) with AKI likely ATN (recovering)  Subjective:  Remains confused, awake this AM Returned ot OR yesterday in PM for continuted bleeding from inguinal wound, now with wound vac Na 157 today, was 144 yesterday 3u pRBC yesterday Has been on IV furosemide Stable on 2L [place] U Vol yesterday was 1.7L  10/09 0701 - 10/10 0700 In: 2930.4 [P.O.:80; I.V.:1080; Blood:950.4; NG/GT:720; IV Piggyback:100] Out: 1675 [Urine:1675]  Filed Weights   02/10/13 0800 02/12/13 0500 02/13/13 0403  Weight: 78.4 kg (172 lb 13.5 oz) 74.4 kg (164 lb 0.4 oz) 72.9 kg (160 lb 11.5 oz)    Current meds: reviewed including Zosyn, TFs @ 32mL/hr, LR @ 71mL/hr Current Labs: reviewed, previuosly: complement levels were normal, no eosinophilia when checked.  Physical Exam:  Blood pressure 126/63, pulse 82, temperature 97.8 F (36.6 C), temperature source Oral, resp. rate 19, height 5' (1.524 m), weight 72.9 kg (160 lb 11.5 oz), SpO2 98.00%. Ill appearing, breathing more comfortably; crying for mother; wants to go home ENT: NGT in place EYES: EOMI CV:RRR, nl s1s2 LUNGS: scattered wheezing, rhonchrous bs  EXT: no LEE ABD: soft, nontender SKIN bandages noted. Wound vac noted NEURO: oriented to self only.    Assessment/Plan 1. AoCKD: Likely ATN.  Atheroembolic disease considered but no eosinophilia on CBC, complement levels normal.  Now is in recovery phase.   2.  Hypernatremia: This is acute since Na was 144 yesterday.  Goal is to have Na back to <145 in next 24h.  Will start D5W at 125/hr and add free water flushes to  TFs q6h.  Check Na at 2pm and 8pm today.  Likely driven by impaired free water access + osmotic clearance (recovering ATN, enteral nutrition, furosemide, glucosuria).  U Na and K to check electrolyte free water clerance today and tomrrow in AM.  If over correcting will  back off on D5W gtt.   2. Acidosis: Resolved, now actually alkalotic likely 2/2 volume contraction; LR to 75/hr 3. Hypoxia, Tachypnea: Now on scheduled scheduled A/A nebs and prn albuterol as well.  Stopped furosemide today.  Doing much better, on 2L [place] 4. S/p F-P bypass and inguinal wound infection; per [facility]VS.   5. Delirium: need to address hypernatremia as above     Sabra Heck MD 02/13/2013, 10:06 AM   Recent Labs Lab 02/09/13 0820 02/10/13 0440 02/11/13 1123 02/12/13 0550 02/13/13 0755  NA 137 141 144 144 157*  K 4.0 3.8 3.5 4.1 4.2  CL 96 97 101 100 92*  CO2 28 29 32 32 36*  GLUCOSE 311* 234* 167* 338* 221*  BUN 90* 80* 77* 81* 77*  CREATININE 4.52* 3.76* 2.77* 2.65* 2.51*  CALCIUM 7.9* 7.8* 8.4 8.1* 8.0*  PHOS  --  6.3*  --   --   --     Recent Labs Lab 02/10/13 0440 02/11/13 1123 02/12/13 0550 02/12/13 1806 02/13/13 0755  WBC 12.9* 15.0* 12.2*  --  14.5*  NEUTROABS 10.9*  --   --   --   --   HGB 10.8* 11.0* 9.9* 7.2* 10.3*  HCT 31.4* 33.4* 30.2* 21.5* 30.7*  MCV 86.5 90.0 90.4  --  88.5  PLT 179 187 176  --  155    Current Facility-Administered Medications  Medication Dose Route Frequency Provider Last Rate Last Dose  . acetaminophen (TYLENOL) tablet 325-650 mg  325-650 mg Oral Q4H PRN Ames Coupe Rhyne, PA-C   650 mg at 02/12/13 0417   Or  . acetaminophen (TYLENOL) suppository 325-650 mg  325-650 mg Rectal Q4H PRN Samantha J Rhyne, PA-C      . albuterol (PROVENTIL HFA;VENTOLIN HFA) 108 (90 BASE) MCG/ACT inhaler 2 puff  2 puff Inhalation Q6H PRN Samantha J Rhyne, PA-C      . albuterol (PROVENTIL) (5 MG/ML) 0.5% nebulizer solution 2.5 mg  2.5 mg Nebulization Q2H PRN Arita Miss, MD   2.5 mg at 02/12/13 0956  . ipratropium (ATROVENT) nebulizer solution 0.5 mg  0.5 mg Nebulization BID Sherren Kerns, MD   0.5 mg at 02/13/13 1914   And  . albuterol (PROVENTIL) (5 MG/ML) 0.5% nebulizer solution 2.5 mg  2.5 mg Nebulization BID Sherren Kerns, MD   2.5 mg at  02/13/13 7829  . aspirin chewable tablet 81 mg  81 mg Oral Daily Samantha J Rhyne, PA-C   81 mg at 02/12/13 1000  . atorvastatin (LIPITOR) tablet 20 mg  20 mg Oral q1800 Samantha J Rhyne, PA-C   20 mg at 02/10/13 1709  . budesonide-formoterol (SYMBICORT) 160-4.5 MCG/ACT inhaler 2 puff  2 puff Inhalation BID Ames Coupe Rhyne, PA-C   2 puff at 02/13/13 0829  . docusate sodium (COLACE) capsule 100 mg  100 mg Oral Daily Samantha J Rhyne, PA-C   100 mg at 02/10/13 1032  . DULoxetine (CYMBALTA) DR capsule 60 mg  60 mg Oral Daily Samantha J Rhyne, PA-C   60 mg at 02/12/13 1025  . feeding supplement (ENSURE) pudding 1 Container  1 Container Oral TID BM Ailene Ards, RD   1 Container at 02/12/13 1000  . feeding supplement (JEVITY 1.2 CAL) liquid 1,000 mL  1,000 mL Per Tube Continuous Ailene Ards, RD 45 mL/hr at 02/13/13 [phone]    . feeding supplement (PRO-STAT SUGAR FREE 64) liquid 30 mL  30 mL Per Tube TID Ailene Ards, RD   30 mL at 02/12/13 2303  . free water 250 mL  250 mL Per Tube Q4H Arita Miss, MD      . furosemide (LASIX) injection 10 mg  10 mg Intravenous UD Fransisco Hertz, MD   10 mg at 02/13/13 0644  . guaiFENesin-dextromethorphan (ROBITUSSIN DM) 100-10 MG/5ML syrup 15 mL  15 mL Oral Q4H PRN Samantha J Rhyne, PA-C      . hydrALAZINE (APRESOLINE) injection 10 mg  10 mg Intravenous Q2H PRN Ames Coupe Rhyne, PA-C   10 mg at 02/11/13 0005  . insulin aspart (novoLOG) injection 0-9 Units  0-9 Units Subcutaneous TID WC Ames Coupe Rhyne, PA-C   3 Units at 02/13/13 [phone]  . insulin glargine (LANTUS) injection 28 Units  28 Units Subcutaneous Daily Ames Coupe Rhyne, PA-C   28 Units at 02/12/13 1025  . labetalol (NORMODYNE,TRANDATE) injection 10 mg  10 mg Intravenous Q2H PRN Samantha J Rhyne, PA-C      . lactated ringers infusion   Intravenous Continuous Pryor Ochoa, MD 50 mL/hr at 02/11/13 2000    . metoprolol (LOPRESSOR) injection 2-5 mg  2-5 mg Intravenous Q2H PRN Samantha J  Rhyne, PA-C      . nitroGLYCERIN (NITROSTAT) SL tablet 0.4 mg  0.4 mg Sublingual Q5 min PRN Samantha J Rhyne, PA-C      . ondansetron (ZOFRAN) injection 4 mg  4 mg Intravenous Q6H PRN Ames Coupe Rhyne, PA-C   4 mg at 02/12/13 1318  .  pantoprazole (PROTONIX) EC tablet 40 mg  40 mg Oral Daily Samantha J Rhyne, PA-C   40 mg at 02/12/13 1000  . phenol (CHLORASEPTIC) mouth spray 1 spray  1 spray Mouth/Throat PRN Samantha J Rhyne, PA-C      . piperacillin-tazobactam (ZOSYN) IVPB 2.25 g  2.25 g Intravenous Q8H Sherren Kerns, MD   2.25 g at 02/13/13 0401

## 2013-02-13 NOTE — Progress Notes (Addendum)
[facility] [place]  Progress Note Bypass Surgery  Date of Surgery: 02/06/2013 - 02/12/2013  Procedure(s): EVACUATION HEMATOMA Surgeon: Surgeon(s): Fransisco Hertz, MD  1 Day Post-Op  History of Present Illness  [name] is a 67 y.o. female who is  1 Day Post-Op.   Calls out with pain when checking wounds.  Confused and asking for her mother. VASC. LAB Studies:        ABI: Right 0.97;  Left 0.97;   Imaging: Dg Chest Port 1 View  02/11/2013   CLINICAL DATA:  Hypoxia, cough and congestion.  EXAM: PORTABLE CHEST - 1 VIEW  COMPARISON:  01/22/2013.  FINDINGS: Feeding tube is followed into the stomach with the tip projecting beyond the inferior margin of the image. Heart is enlarged. Mild diffuse bilateral airspace disease with consolidative changes at the left lung base. Probable left pleural effusion.  IMPRESSION: Favor congestive heart failure. Difficult to exclude left lower lobe pneumonia.   Electronically Signed   By: Leanna Battles M.D.   On: 02/11/2013 10:40   Dg Abd Portable 1v  02/12/2013   CLINICAL DATA:  Feeding tube placement  EXAM: PORTABLE ABDOMEN - 1 VIEW  COMPARISON:  Prior chest x-ray 02/10/2013  FINDINGS: The tip of the feeding tube is in the gastric antrum. The bowel gas pattern is nonobstructive. Stable left basilar opacity. 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 Degenerative disc disease and lower lumbar facet arthropathy.  IMPRESSION: The tip of the enteric feeding tube is in the gastric antrum.   Electronically Signed   By: Malachy Moan M.D.   On: 02/12/2013 20:18    Significant Diagnostic Studies: CBC Lab Results  Component Value Date   WBC 12.2* 02/12/2013   HGB 7.2* 02/12/2013   HCT 21.5* 02/12/2013   MCV 90.4 02/12/2013   PLT 176 02/12/2013    BMET    Component Value Date/Time   NA 144 02/12/2013 0550   K 4.1 02/12/2013 0550   CL 100 02/12/2013 0550   CO2 32 02/12/2013 0550   GLUCOSE 338* 02/12/2013 0550   BUN 81*  02/12/2013 0550   CREATININE 2.65* 02/12/2013 0550   CALCIUM 8.1* 02/12/2013 0550   GFRNONAA 18* 02/12/2013 0550   GFRAA 20* 02/12/2013 0550    COAG Lab Results  Component Value Date   INR 1.09 01/13/2013   INR 1.03 04/16/2012   INR 1.07 05/17/2011   No results found for this basename: PTT    Physical Examination  BP Readings from Last 3 Encounters:  02/13/13 134/78  02/13/13 134/78  02/13/13 134/78   Temp Readings from Last 3 Encounters:  02/13/13 97.9 F (36.6 C) Oral  02/13/13 97.9 F (36.6 C) Oral  02/13/13 97.9 F (36.6 C) Oral   SpO2 Readings from Last 3 Encounters:  02/13/13 95%  02/13/13 95%  02/13/13 95%   Pulse Readings from Last 3 Encounters:  02/13/13 79  02/13/13 79  02/13/13 79    Pt is A&O x 3 right lower extremity: Incision/s is/are clean,dry.intact.   With wound vac in place at groin site. Limb is warm; with good color Biphasic doppler signals PT/DP   Assessment/Plan: Pt. Doing well Post-op pain is controlled Wounds are clean, dry, intact or healing well, wound vac in groin. Transfused 3 units PRBC 02/12/2013 pending CBC today Kangaroo NG tube feeding Continue wound care as ordered  [name] [facility]  02/13/2013 7:50 AM   More alert No further bleeding, right foot warm with  doppler signals Albumin 1.9 Creatinine 2.5 trending down Need to continue feeding tube until she can take in enough calories to help with wound healing Start calorie count today.  Will leave feeding tube for at least another 72 hours to make sure she will eat Spoke with daughter and updated condition  Fabienne Bruns, MD [facility] [place] Office: [phone] Pager: [phone]

## 2013-02-13 NOTE — Progress Notes (Signed)
NUTRITION CONSULT/FOLLOW UP  Intervention:    Initiate 48 hour calorie count -- defer documentation of results until Monday, 8/13  Continue Ensure Pudding 3 times daily (170 kcals, 4 gm protein per 4 oz cup) RD to follow for nutrition care plan  Nutrition Dx:   Inadequate oral intake now related to delirium, poor appetite as evidenced by need for EN support, ongoing  New Goal:   Successful transition to oral diet to meet >/= 90% of estimated nutrition needs, pending  Monitor:   EN regimen & tolerance, PO intake, weight, labs, I/O's  Assessment:   Patient with PMH of stroke and CABG; recently underwent right femoral to below-knee popliteal bypass on 9/16; began complaining of swelling in right groin and some redness; presented to the Vascular Surgery office with drainage from her right groin.   Patient s/p procedure 10/3:  DEBRIDEMENT RIGHT GROIN  PLACEMENT OF VAC DRESSING   Patient s/p procedure 10/8:  DEBRIDEMENT RIGHT GROIN & WOUND VAC APPLICATION  Patient s/p procedure 10/9: EVACUATION HEMATOMA (Right)   Jevity 1.2 formula infusing at goal rate of 45 ml/hr via NGT with Prostat liquid protein 30 ml 3 times daily via tube providing 1596 kcals, 105 gm protein, 872 ml of free water.  Wound VAC restarted.  RD consulted for 48 hr calorie count.  Height: Ht Readings from Last 1 Encounters:  02/06/13 5' (1.524 m)    Weight Status:   Wt Readings from Last 1 Encounters:  02/13/13 160 lb 11.5 oz (72.9 kg)    Re-estimated needs:  Kcal: 1500-1700 Protein: 100-110 gm Fluid: 1.5-1.7 L  Skin: wound VAC to right groin  Diet Order: Dysphagia 3, thin liquids   Intake/Output Summary (Last 24 hours) at 02/13/13 1429 Last data filed at 02/13/13 1000  Gross per 24 hour  Intake 2320.41 ml  Output    800 ml  Net 1520.41 ml    Labs:   Recent Labs Lab 02/09/13 0820 02/10/13 0440 02/11/13 1123 02/12/13 0550 02/13/13 0755  NA 137 141 144 144 157*  K 4.0 3.8 3.5 4.1 4.2   CL 96 97 101 100 92*  CO2 28 29 32 32 36*  BUN 90* 80* 77* 81* 77*  CREATININE 4.52* 3.76* 2.77* 2.65* 2.51*  CALCIUM 7.9* 7.8* 8.4 8.1* 8.0*  PHOS  --  6.3*  --   --   --   GLUCOSE 311* 234* 167* 338* 221*    CBG (last 3)   Recent Labs  02/12/13 2347 02/13/13 0400 02/13/13 0815  GLUCAP 187* 233* 229*    Scheduled Meds: . ipratropium  0.5 mg Nebulization BID   And  . albuterol  2.5 mg Nebulization BID  . aspirin  81 mg Oral Daily  . atorvastatin  20 mg Oral q1800  . budesonide-formoterol  2 puff Inhalation BID  . docusate sodium  100 mg Oral Daily  . feeding supplement (ENSURE)  1 Container Oral TID BM  . feeding supplement (PRO-STAT SUGAR FREE 64)  30 mL Per Tube TID  . free water  250 mL Per Tube Q6H  . furosemide  10 mg Intravenous UD  . insulin aspart  0-9 Units Subcutaneous TID WC  . insulin glargine  28 Units Subcutaneous Daily  . pantoprazole (PROTONIX) IV  40 mg Intravenous Q24H  . piperacillin-tazobactam (ZOSYN)  IV  3.375 g Intravenous Q8H    Continuous Infusions: . dextrose 125 mL/hr at 02/13/13 1115  . feeding supplement (JEVITY 1.2 CAL) 1,000 mL (02/13/13 1000)  .  lactated ringers 50 mL/hr at 02/13/13 1116    Maureen Chatters, RD, LDN Pager #: [phone] After-Hours Pager #: [phone]

## 2013-02-14 LAB — WOUND CULTURE

## 2013-02-14 LAB — TYPE AND SCREEN
ABO/RH(D): A POS
Antibody Screen: NEGATIVE
Unit division: 0

## 2013-02-14 LAB — BASIC METABOLIC PANEL
BUN: 69 mg/dL — ABNORMAL HIGH (ref 6–23)
CO2: 33 mEq/L — ABNORMAL HIGH (ref 19–32)
Chloride: 98 mEq/L (ref 96–112)
Creatinine, Ser: 2.19 mg/dL — ABNORMAL HIGH (ref 0.50–1.10)
Glucose, Bld: 301 mg/dL — ABNORMAL HIGH (ref 70–99)
Potassium: 3.8 mEq/L (ref 3.5–5.1)

## 2013-02-14 LAB — GLUCOSE, CAPILLARY
Glucose-Capillary: 156 mg/dL — ABNORMAL HIGH (ref 70–99)
Glucose-Capillary: 177 mg/dL — ABNORMAL HIGH (ref 70–99)
Glucose-Capillary: 217 mg/dL — ABNORMAL HIGH (ref 70–99)
Glucose-Capillary: 291 mg/dL — ABNORMAL HIGH (ref 70–99)
Glucose-Capillary: 295 mg/dL — ABNORMAL HIGH (ref 70–99)
Glucose-Capillary: 360 mg/dL — ABNORMAL HIGH (ref 70–99)

## 2013-02-14 LAB — CBC
HCT: 29.5 % — ABNORMAL LOW (ref 36.0–46.0)
Hemoglobin: 10 g/dL — ABNORMAL LOW (ref 12.0–15.0)
MCV: 88.9 fL (ref 78.0–100.0)
RBC: 3.32 MIL/uL — ABNORMAL LOW (ref 3.87–5.11)
RDW: 14.9 % (ref 11.5–15.5)
WBC: 14.7 10*3/uL — ABNORMAL HIGH (ref 4.0–10.5)

## 2013-02-14 LAB — NA AND K (SODIUM & POTASSIUM), RAND UR: Potassium Urine: 7 mEq/L

## 2013-02-14 MED ORDER — INSULIN ASPART 100 UNIT/ML ~~LOC~~ SOLN
0.0000 [IU] | SUBCUTANEOUS | Status: DC
  Administered 2013-02-14: 3 [IU] via SUBCUTANEOUS
  Administered 2013-02-14: 8 [IU] via SUBCUTANEOUS
  Administered 2013-02-14: 3 [IU] via SUBCUTANEOUS
  Administered 2013-02-14 – 2013-02-15 (×2): 5 [IU] via SUBCUTANEOUS
  Administered 2013-02-15: 3 [IU] via SUBCUTANEOUS
  Administered 2013-02-15: 5 [IU] via SUBCUTANEOUS
  Administered 2013-02-15: 11 [IU] via SUBCUTANEOUS
  Administered 2013-02-15: 2 [IU] via SUBCUTANEOUS
  Administered 2013-02-15 – 2013-02-16 (×2): 3 [IU] via SUBCUTANEOUS
  Administered 2013-02-16: 5 [IU] via SUBCUTANEOUS
  Administered 2013-02-16: 2 [IU] via SUBCUTANEOUS
  Administered 2013-02-16: 3 [IU] via SUBCUTANEOUS
  Administered 2013-02-16: 5 [IU] via SUBCUTANEOUS
  Administered 2013-02-16: 3 [IU] via SUBCUTANEOUS
  Administered 2013-02-17 (×2): 2 [IU] via SUBCUTANEOUS

## 2013-02-14 MED ORDER — INSULIN GLARGINE 100 UNIT/ML ~~LOC~~ SOLN
28.0000 [IU] | Freq: Every day | SUBCUTANEOUS | Status: DC
  Administered 2013-02-14 – 2013-02-27 (×13): 28 [IU] via SUBCUTANEOUS
  Filled 2013-02-14 (×12): qty 0.28

## 2013-02-14 NOTE — Progress Notes (Deleted)
Nursing  Pt confused and attempting to crawl out of bed. Pt scared and disoriented. Unable to reorient pt more than a few minutes at a time. Side rails up and alarms on. Pt daughter, Steward Drone notified. Daughter to come back to hospital to sit with pt.

## 2013-02-14 NOTE — Progress Notes (Signed)
Admit: 02/06/2013 LOS: 8  24F w/ PVD s/p Fem-Pop bypass 9/16 c/b by wound infection s/p OR debriedment 10/3 and known CKD (BL SCr 1.5) with AKI likely ATN (recovering)  Subjective:  Pt with marked improvement in mental status.  More alert, appropriate.   Hypernatremia resolved yesterday (on D5W and FWF in NGT, but pt became able to take ad lib fluids).   D5W and FWF now stopped LR stopped this AM No currently on furosemide Pt feels well, very anxious, wants ot be home PT and OT not able to work with recently because of acute issues.    10/10 0701 - 10/11 0700 In: 3328.8 [P.O.:240; I.V.:2493.8; NG/GT:495; IV Piggyback:100] Out: 1550 [Urine:1500; Drains:50]  Filed Weights   02/12/13 0500 02/13/13 0403 02/14/13 0500  Weight: 74.4 kg (164 lb 0.4 oz) 72.9 kg (160 lb 11.5 oz) 74.1 kg (163 lb 5.8 oz)    Current meds: reviewed Current Labs: reviewed, previuosly: complement levels were normal, no eosinophilia when checked.  Physical Exam:  Blood pressure 161/69, pulse 78, temperature 98.1 F (36.7 C), temperature source Oral, resp. rate 16, height 5' (1.524 m), weight 74.1 kg (163 lb 5.8 oz), SpO2 93.00%. NAD, much more awake and appropriate ENT: NGT in place EYES: EOMI CV:RRR, nl s1s2 LUNGS: scattered wheezing, rhonchrous bs, minimal crackles on exam EXT: no LEE ABD: soft, nontender SKIN bandages noted. Wound vac noted NEURO: oriented to self, location  Assessment/Plan 1. AoCKD: Likely ATN.  Atheroembolic disease considered but no eosinophilia on CBC, complement levels normal.  Now is in recovery phase.   2.  Hypernatremia: Resolved with free water access.  Stable off FWF and D5W now that PO intake improved.  Electrolyte free water clearance was as expected  3. Hypoxia, Tachypnea: Now on scheduled scheduled A/A nebs and prn albuterol as well.  Currently off furosemide.  Doing much better, on 2L [place] 4. S/p F-P bypass and inguinal wound infection; per VVS.   5. Volume status: will stop  LR, no furosemide currently.  Good UOP.  Might need lower dose of furosemide movnig forward. 6. Delirium: markedly improved today.  Cymbalta was also removed.   7. Acidosis: Resolved   Sabra Heck MD 02/14/2013, 10:15 AM   Recent Labs Lab 02/09/13 0820 02/10/13 0440  02/13/13 0755 02/13/13 1511 02/13/13 2033 02/14/13 0430  NA 137 141  < > 157* 141 137 141  K 4.0 3.8  < > 4.2 3.8  --  3.8  CL 96 97  < > 92* 96  --  98  CO2 28 29  < > 36* 35*  --  33*  GLUCOSE 311* 234*  < > 221* 233*  --  301*  BUN 90* 80*  < > 77* 74*  --  69*  CREATININE 4.52* 3.76*  < > 2.51* 2.38*  --  2.19*  CALCIUM 7.9* 7.8*  < > 8.0* 8.1*  --  7.6*  PHOS  --  6.3*  --   --   --   --   --   < > = values in this interval not displayed.  Recent Labs Lab 02/10/13 0440  02/12/13 0550 02/12/13 1806 02/13/13 0755 02/14/13 0430  WBC 12.9*  < > 12.2*  --  14.5* 14.7*  NEUTROABS 10.9*  --   --   --   --   --   HGB 10.8*  < > 9.9* 7.2* 10.3* 10.0*  HCT 31.4*  < > 30.2* 21.5* 30.7* 29.5*  MCV 86.5  < >  90.4  --  88.5 88.9  PLT 179  < > 176  --  155 168  < > = values in this interval not displayed.  Current Facility-Administered Medications  Medication Dose Route Frequency Provider Last Rate Last Dose  . acetaminophen (TYLENOL) tablet 325-650 mg  325-650 mg Oral Q4H PRN Ames Coupe Rhyne, PA-C   325 mg at 02/14/13 0021   Or  . acetaminophen (TYLENOL) suppository 325-650 mg  325-650 mg Rectal Q4H PRN Samantha J Rhyne, PA-C      . albuterol (PROVENTIL HFA;VENTOLIN HFA) 108 (90 BASE) MCG/ACT inhaler 2 puff  2 puff Inhalation Q6H PRN Samantha J Rhyne, PA-C      . albuterol (PROVENTIL) (5 MG/ML) 0.5% nebulizer solution 2.5 mg  2.5 mg Nebulization Q2H PRN Arita Miss, MD   2.5 mg at 02/12/13 0956  . ipratropium (ATROVENT) nebulizer solution 0.5 mg  0.5 mg Nebulization BID Sherren Kerns, MD   0.5 mg at 02/13/13 1913   And  . albuterol (PROVENTIL) (5 MG/ML) 0.5% nebulizer solution 2.5 mg  2.5 mg Nebulization  BID Sherren Kerns, MD   2.5 mg at 02/13/13 1913  . aspirin chewable tablet 81 mg  81 mg Oral Daily Ames Coupe Rhyne, PA-C   81 mg at 02/14/13 0948  . atorvastatin (LIPITOR) tablet 20 mg  20 mg Oral q1800 Samantha J Rhyne, PA-C   20 mg at 02/10/13 1709  . budesonide-formoterol (SYMBICORT) 160-4.5 MCG/ACT inhaler 2 puff  2 puff Inhalation BID Ames Coupe Rhyne, PA-C   2 puff at 02/13/13 2120  . docusate sodium (COLACE) capsule 100 mg  100 mg Oral Daily Ames Coupe Rhyne, PA-C   100 mg at 02/14/13 0948  . feeding supplement (ENSURE) pudding 1 Container  1 Container Oral TID BM Ailene Ards, RD   1 Container at 02/14/13 0950  . feeding supplement (JEVITY 1.2 CAL) liquid 1,000 mL  1,000 mL Per Tube Continuous Ailene Ards, RD 45 mL/hr at 02/14/13 0645    . feeding supplement (PRO-STAT SUGAR FREE 64) liquid 30 mL  30 mL Per Tube TID Ailene Ards, RD   30 mL at 02/14/13 0952  . guaiFENesin-dextromethorphan (ROBITUSSIN DM) 100-10 MG/5ML syrup 15 mL  15 mL Oral Q4H PRN Samantha J Rhyne, PA-C      . hydrALAZINE (APRESOLINE) injection 10 mg  10 mg Intravenous Q2H PRN Ames Coupe Rhyne, PA-C   10 mg at 02/11/13 0005  . insulin aspart (novoLOG) injection 0-15 Units  0-15 Units Subcutaneous Q4H Nada Libman, MD   8 Units at 02/14/13 0914  . insulin glargine (LANTUS) injection 28 Units  28 Units Subcutaneous Daily Nada Libman, MD   28 Units at 02/14/13 0914  . labetalol (NORMODYNE,TRANDATE) injection 10 mg  10 mg Intravenous Q2H PRN Samantha J Rhyne, PA-C      . metoprolol (LOPRESSOR) injection 2-5 mg  2-5 mg Intravenous Q2H PRN Samantha J Rhyne, PA-C      . nitroGLYCERIN (NITROSTAT) SL tablet 0.4 mg  0.4 mg Sublingual Q5 min PRN Samantha J Rhyne, PA-C      . ondansetron (ZOFRAN) injection 4 mg  4 mg Intravenous Q6H PRN Ames Coupe Rhyne, PA-C   4 mg at 02/13/13 1920  . pantoprazole sodium (PROTONIX) 40 mg/20 mL oral suspension 40 mg  40 mg Per Tube Daily Hilario Quarry Amend, RPH    40 mg at 02/14/13 0951  . phenol (CHLORASEPTIC) mouth spray 1 spray  1  spray Mouth/Throat PRN Ames Coupe Rhyne, PA-C      . piperacillin-tazobactam (ZOSYN) IVPB 3.375 g  3.375 g Intravenous Q8H Candis Schatz Mancheril, RPH   3.375 g at 02/14/13 0440

## 2013-02-14 NOTE — Progress Notes (Signed)
Occupational Therapy Evaluation Patient Details Name: [name] MRN: [id number] DOB: 1945/12/01 Today's Date: 02/14/2013 Time: 4098-1191 OT Time Calculation (min): 42 min  OT Assessment / Plan / Recommendation History of present illness Pt with infection to R going from previous R fem to BK pop BPG   Clinical Impression   PTA, pt lived with family and was mod I with ADL and mobility. Pt did well during eval and ambulated @20  ft @ RW level with min A @ 2LO2. Fatigues easily. Alert and oriented with excellent participation with OT. Pt will benefit from skilled OT services to increase strength and mobility to maximize independence with ADL to facilitate D/C to next venue due to below deficits.    OT Assessment  Patient needs continued OT Services    Follow Up Recommendations  SNF;Supervision/Assistance - 24 hour    Barriers to Discharge Decreased caregiver support supportive family but work during the day  Equipment Recommendations  None recommended by OT    Recommendations for Other Services    Frequency  Min 2X/week    Precautions / Restrictions Precautions Precautions: Fall Precaution Comments: wound vac R groin   Pertinent Vitals/Pain Vitals stable    ADL  Eating/Feeding: Supervision/safety;Set up Grooming: Set up;Supervision/safety Where Assessed - Grooming: Supported sitting Upper Body Bathing: Minimal assistance Where Assessed - Upper Body Bathing: Supported sitting Lower Body Bathing: Maximal assistance Where Assessed - Lower Body Bathing: Supported sit to stand Upper Body Dressing: Minimal assistance Where Assessed - Upper Body Dressing: Supported sitting Lower Body Dressing: Maximal assistance Where Assessed - Lower Body Dressing: Supported sit to Pharmacist, hospital: Minimal assistance;Simulated Statistician Method: Sit to stand Equipment Used: Gait belt;Rolling walker Transfers/Ambulation Related to ADLs: min A ADL Comments: family assisted with  lines/IV pole    OT Diagnosis: Generalized weakness;Acute pain  OT Problem List: Decreased strength;Decreased activity tolerance;Decreased safety awareness;Decreased knowledge of use of DME or AE;Decreased knowledge of precautions;Cardiopulmonary status limiting activity;Obesity;Pain OT Treatment Interventions: Self-care/ADL training;Therapeutic exercise;Energy conservation;DME and/or AE instruction;Therapeutic activities;Patient/family education   OT Goals(Current goals can be found in the care plan section) Acute Rehab OT Goals Patient Stated Goal: to get home OT Goal Formulation: With patient Time For Goal Achievement: 02/28/13 Potential to Achieve Goals: Good  Visit Information  Last OT Received On: 02/14/13 Assistance Needed: +2 (for line mgnt) History of Present Illness: Pt with infection to R going from previous R fem to BK pop BPG       Prior Functioning     Home Living Family/patient expects to be discharged to:: Skilled nursing facility Prior Function Level of Independence: Needs assistance Gait / Transfers Assistance Needed: Using RW with min assist following bypass surgery Communication Communication: No difficulties Dominant Hand: Right         Vision/Perception Perception Perception: Within Functional Limits Praxis Praxis: Intact   Cognition  Cognition Arousal/Alertness: Awake/alert Behavior During Therapy: WFL for tasks assessed/performed Overall Cognitive Status: Impaired/Different from baseline General Comments: imrpving    Extremity/Trunk Assessment Upper Extremity Assessment Upper Extremity Assessment: Generalized weakness Lower Extremity Assessment Lower Extremity Assessment: Generalized weakness Cervical / Trunk Assessment Cervical / Trunk Assessment: Kyphotic (history of back problems)     Mobility Bed Mobility Bed Mobility: Left Sidelying to Sit;Rolling Left;Sitting - Scoot to Edge of Bed Rolling Left: 5: Supervision Left Sidelying to  Sit: 4: Min assist Supine to Sit: 4: Min assist;HOB elevated Transfers Transfers: Sit to Stand;Stand to Sit Sit to Stand: 4: Min assist;From bed Stand to Sit: 4:  Min assist;To chair/3-in-1 Details for Transfer Assistance: vc for hand placement     Exercise Other Exercises Other Exercises: BLE LE and BU AROM   Balance Static Sitting Balance Static Sitting - Balance Support: Feet supported;No upper extremity supported Static Sitting - Level of Assistance: 6: Modified independent (Device/Increase time) Static Standing Balance Static Standing - Balance Support: During functional activity Static Standing - Level of Assistance: 4: Min assist   End of Session OT - End of Session Equipment Utilized During Treatment: Gait belt;Rolling walker Activity Tolerance: Patient tolerated treatment well Patient left: in chair;with call bell/phone within reach;with family/visitor present Nurse Communication: Mobility status  GO     Laverta Harnisch,HILLARY 02/14/2013, 5:26 PM [facility], OTR/L  [phone] 02/14/2013

## 2013-02-14 NOTE — Progress Notes (Signed)
[facility] [place]  Subjective  -   The patient feels better today.  She continues to complain about her NG tube.  She is trying to tolerate an increase her oral intake.   Physical Exam:  Wound VAC remains in place without significant drainage Extremities are warm and well-perfused Abdomen is soft and nontender       Assessment/Plan:    The patient's son was at the bedside.  She has had no further bleeding from her right groin incision.  Cultures from the wound grew out Serratia.  She is on appropriate antibiotic coverage.  Continue with NG tube feeding.  Consideration for removal of the NG tube will be made on Monday depending on her oral intake.  She will need physical therapy.  She is off anticoagulation because of her bleeding.  I'll placed bilateral SCDs today for DVT prophylaxis  BRABHAM IV, V. WELLS 02/14/2013 11:41 AM --  Filed Vitals:   02/14/13 0920  BP: 161/69  Pulse: 78  Temp:   Resp: 16    Intake/Output Summary (Last 24 hours) at 02/14/13 1141 Last data filed at 02/14/13 1000  Gross per 24 hour  Intake 3433.75 ml  Output   1800 ml  Net 1633.75 ml     Laboratory CBC    Component Value Date/Time   WBC 14.7* 02/14/2013 0430   WBC 9.2 12/19/2012 1601   HGB 10.0* 02/14/2013 0430   HGB 13.7 12/19/2012 1601   HCT 29.5* 02/14/2013 0430   HCT 42.9 12/19/2012 1601   PLT 168 02/14/2013 0430    BMET    Component Value Date/Time   NA 141 02/14/2013 0430   K 3.8 02/14/2013 0430   CL 98 02/14/2013 0430   CO2 33* 02/14/2013 0430   GLUCOSE 301* 02/14/2013 0430   BUN 69* 02/14/2013 0430   CREATININE 2.19* 02/14/2013 0430   CALCIUM 7.6* 02/14/2013 0430   GFRNONAA 22* 02/14/2013 0430   GFRAA 26* 02/14/2013 0430    COAG Lab Results  Component Value Date   INR 1.09 01/13/2013   INR 1.03 04/16/2012   INR 1.07 05/17/2011   No results found for this basename: PTT    Antibiotics Anti-infectives   Start     Dose/Rate  Route Frequency Ordered Stop   02/13/13 1200  piperacillin-tazobactam (ZOSYN) IVPB 3.375 g     3.375 g 12.5 mL/hr over 240 Minutes Intravenous Every 8 hours 02/13/13 1037     02/11/13 0600  vancomycin (VANCOCIN) IVPB 1000 mg/200 mL premix  Status:  Discontinued     1,000 mg 200 mL/hr over 60 Minutes Intravenous Every 48 hours 02/10/13 1048 02/12/13 0818   02/08/13 1330  vancomycin (VANCOCIN) IVPB 1000 mg/200 mL premix     1,000 mg 200 mL/hr over 60 Minutes Intravenous  Once 02/08/13 1309 02/08/13 1600   02/07/13 1200  piperacillin-tazobactam (ZOSYN) IVPB 2.25 g  Status:  Discontinued     2.25 g 100 mL/hr over 30 Minutes Intravenous Every 8 hours 02/07/13 0304 02/13/13 1037   02/07/13 1000  vancomycin (VANCOCIN) IVPB 1000 mg/200 mL premix  Status:  Discontinued     1,000 mg 200 mL/hr over 60 Minutes Intravenous Every 24 hours 02/06/13 1801 02/07/13 0303   02/06/13 1830  piperacillin-tazobactam (ZOSYN) IVPB 3.375 g  Status:  Discontinued     3.375 g 12.5 mL/hr over 240 Minutes Intravenous Every 8 hours 02/06/13 1759 02/07/13 0304   02/05/13 1422  vancomycin (VANCOCIN) IVPB 1000 mg/200 mL premix  1,000 mg 200 mL/hr over 60 Minutes Intravenous 60 min pre-op 02/05/13 1422 02/06/13 1050       V. Charlena Cross, M.D. [facility] [place] Office: [phone] Pager:  [phone]

## 2013-02-15 LAB — CBC
MCH: 30.4 pg (ref 26.0–34.0)
MCHC: 33.3 g/dL (ref 30.0–36.0)
Platelets: 165 10*3/uL (ref 150–400)
RBC: 3.16 MIL/uL — ABNORMAL LOW (ref 3.87–5.11)
RDW: 14.8 % (ref 11.5–15.5)

## 2013-02-15 LAB — BASIC METABOLIC PANEL
Calcium: 7.7 mg/dL — ABNORMAL LOW (ref 8.4–10.5)
GFR calc Af Amer: 27 mL/min — ABNORMAL LOW (ref >90)
GFR calc non Af Amer: 24 mL/min — ABNORMAL LOW (ref >90)
Potassium: 3.9 mEq/L (ref 3.5–5.1)
Sodium: 138 mEq/L (ref 135–145)

## 2013-02-15 LAB — GLUCOSE, CAPILLARY
Glucose-Capillary: 203 mg/dL — ABNORMAL HIGH (ref 70–99)
Glucose-Capillary: 185 mg/dL — ABNORMAL HIGH (ref 70–99)
Glucose-Capillary: 179 mg/dL — ABNORMAL HIGH (ref 70–99)

## 2013-02-15 MED ORDER — PANTOPRAZOLE SODIUM 40 MG PO TBEC
40.0000 mg | DELAYED_RELEASE_TABLET | Freq: Every day | ORAL | Status: DC
  Administered 2013-02-15 – 2013-02-27 (×13): 40 mg via ORAL
  Filled 2013-02-15 (×13): qty 1

## 2013-02-15 NOTE — Progress Notes (Signed)
Admit: 02/06/2013 LOS: 9  71F w/ PVD s/p Fem-Pop bypass 9/16 c/b by wound infection s/p OR debriedment 10/3 and known CKD (BL SCr 1.5) with AKI likely ATN (recovering)  Subjective:  Pt with normalizaiton of mental status this AM, eating well, feels great Retired Child psychotherapist.  [employer] basketball fan Some confusion overnight No complaints, no SOB.  GOod UOP  10/11 0701 - 10/12 0700 In: 2027.5 [P.O.:585; I.V.:150; NG/GT:1155; IV Piggyback:137.5] Out: 2375 [Urine:2300; Drains:75]  Filed Weights   02/13/13 0403 02/14/13 0500 02/15/13 0500  Weight: 72.9 kg (160 lb 11.5 oz) 74.1 kg (163 lb 5.8 oz) 77.8 kg (171 lb 8.3 oz)    Current meds: reviewed Current Labs: reviewed, previuosly: complement levels were normal, no eosinophilia when checked.  Physical Exam:  Blood pressure 165/70, pulse 72, temperature 97.7 F (36.5 C), temperature source Oral, resp. rate 17, height 5' (1.524 m), weight 77.8 kg (171 lb 8.3 oz), SpO2 93.00%. NAD, much more awake and appropriate, sitting in chair ENT: NGT, EYES: EOMI CV:RRR, nl s1s2 LUNGS: relatively clear this AM, improved EXT: no LEE ABD: soft, nontender SKIN bandages noted. Wound vac noted NEURO: oriented to self, location GU: foley noted  Assessment/Plan 1. AoCKD: Likely ATN.  Atheroembolic disease considered but no eosinophilia on CBC, complement levels normal.  Now is in recovery phase.  OK with removal of foley.  Never rec HD.  Will arrange f/u appt with me in near future as pt w/ some CKD likley beforehand.  2.  Hypernatremia: Resolved with free water access.  Stable off FWF and D5W now that PO intake improved.  Electrolyte free water clearance was as expected 3. Hypoxia, Tachypnea: Now on scheduled scheduled A/A nebs and prn albuterol as well.  Currently off furosemide.  Doing much better, on 1L [place], uses O2 at home 4. S/p F-P bypass and inguinal wound infection w/ Serratia; per [facility].   5. Volume status: No IVFs.  Rec enteral nutrition and  taking PO well. 6. Delirium: markedly improved today.  Cymbalta was also removed.   7. Acidosis: Resolved   Sabra Heck MD 02/15/2013, 9:59 AM   Recent Labs Lab 02/09/13 0820 02/10/13 0440  02/13/13 1511 02/13/13 2033 02/14/13 0430 02/15/13 0340  NA 137 141  < > 141 137 141 138  K 4.0 3.8  < > 3.8  --  3.8 3.9  CL 96 97  < > 96  --  98 97  CO2 28 29  < > 35*  --  33* 32  GLUCOSE 311* 234*  < > 233*  --  301* 221*  BUN 90* 80*  < > 74*  --  69* 57*  CREATININE 4.52* 3.76*  < > 2.38*  --  2.19* 2.09*  CALCIUM 7.9* 7.8*  < > 8.1*  --  7.6* 7.7*  PHOS  --  6.3*  --   --   --   --   --   < > = values in this interval not displayed.  Recent Labs Lab 02/10/13 0440  02/13/13 0755 02/14/13 0430 02/15/13 0340  WBC 12.9*  < > 14.5* 14.7* 13.8*  NEUTROABS 10.9*  --   --   --   --   HGB 10.8*  < > 10.3* 10.0* 9.6*  HCT 31.4*  < > 30.7* 29.5* 28.8*  MCV 86.5  < > 88.5 88.9 91.1  PLT 179  < > 155 168 165  < > = values in this interval not displayed.  Current  Facility-Administered Medications  Medication Dose Route Frequency Provider Last Rate Last Dose  . acetaminophen (TYLENOL) tablet 325-650 mg  325-650 mg Oral Q4H PRN Ames Coupe Rhyne, PA-C   650 mg at 02/15/13 9147   Or  . acetaminophen (TYLENOL) suppository 325-650 mg  325-650 mg Rectal Q4H PRN Samantha J Rhyne, PA-C      . albuterol (PROVENTIL HFA;VENTOLIN HFA) 108 (90 BASE) MCG/ACT inhaler 2 puff  2 puff Inhalation Q6H PRN Samantha J Rhyne, PA-C      . albuterol (PROVENTIL) (5 MG/ML) 0.5% nebulizer solution 2.5 mg  2.5 mg Nebulization Q2H PRN Arita Miss, MD   2.5 mg at 02/12/13 0956  . ipratropium (ATROVENT) nebulizer solution 0.5 mg  0.5 mg Nebulization BID Sherren Kerns, MD   0.5 mg at 02/15/13 0933   And  . albuterol (PROVENTIL) (5 MG/ML) 0.5% nebulizer solution 2.5 mg  2.5 mg Nebulization BID Sherren Kerns, MD   2.5 mg at 02/15/13 0933  . aspirin chewable tablet 81 mg  81 mg Oral Daily Samantha J Rhyne, PA-C    81 mg at 02/15/13 0915  . atorvastatin (LIPITOR) tablet 20 mg  20 mg Oral q1800 Samantha J Rhyne, PA-C   20 mg at 02/14/13 1742  . budesonide-formoterol (SYMBICORT) 160-4.5 MCG/ACT inhaler 2 puff  2 puff Inhalation BID Ames Coupe Rhyne, PA-C   2 puff at 02/15/13 0933  . docusate sodium (COLACE) capsule 100 mg  100 mg Oral Daily Ames Coupe Rhyne, PA-C   100 mg at 02/14/13 0948  . feeding supplement (ENSURE) pudding 1 Container  1 Container Oral TID BM Ailene Ards, RD   1 Container at 02/15/13 0915  . feeding supplement (JEVITY 1.2 CAL) liquid 1,000 mL  1,000 mL Per Tube Continuous Ailene Ards, RD 45 mL/hr at 02/14/13 0645    . feeding supplement (PRO-STAT SUGAR FREE 64) liquid 30 mL  30 mL Per Tube TID Ailene Ards, RD   30 mL at 02/15/13 0913  . guaiFENesin-dextromethorphan (ROBITUSSIN DM) 100-10 MG/5ML syrup 15 mL  15 mL Oral Q4H PRN Samantha J Rhyne, PA-C      . hydrALAZINE (APRESOLINE) injection 10 mg  10 mg Intravenous Q2H PRN Ames Coupe Rhyne, PA-C   10 mg at 02/11/13 0005  . insulin aspart (novoLOG) injection 0-15 Units  0-15 Units Subcutaneous Q4H Nada Libman, MD   3 Units at 02/15/13 0912  . insulin glargine (LANTUS) injection 28 Units  28 Units Subcutaneous Daily Nada Libman, MD   28 Units at 02/15/13 0913  . labetalol (NORMODYNE,TRANDATE) injection 10 mg  10 mg Intravenous Q2H PRN Samantha J Rhyne, PA-C      . metoprolol (LOPRESSOR) injection 2-5 mg  2-5 mg Intravenous Q2H PRN Samantha J Rhyne, PA-C      . nitroGLYCERIN (NITROSTAT) SL tablet 0.4 mg  0.4 mg Sublingual Q5 min PRN Samantha J Rhyne, PA-C      . ondansetron (ZOFRAN) injection 4 mg  4 mg Intravenous Q6H PRN Ames Coupe Rhyne, PA-C   4 mg at 02/13/13 1920  . pantoprazole sodium (PROTONIX) 40 mg/20 mL oral suspension 40 mg  40 mg Per Tube Daily Hilario Quarry Amend, RPH   40 mg at 02/14/13 0951  . phenol (CHLORASEPTIC) mouth spray 1 spray  1 spray Mouth/Throat PRN Samantha J Rhyne, PA-C      .  piperacillin-tazobactam (ZOSYN) IVPB 3.375 g  3.375 g Intravenous Q8H Candis Schatz Mancheril, RPH   3.375  g at 02/15/13 0515

## 2013-02-15 NOTE — Progress Notes (Addendum)
[facility]   Subjective  - Alert and oriented.  Ready to watch [employer] football today.  Good PO intake.   Objective 165/70 72 97.7 F (36.5 C) (Oral) 17 94%  Intake/Output Summary (Last 24 hours) at 02/15/13 0930 Last data filed at 02/15/13 0800  Gross per 24 hour  Intake 1747.5 ml  Output   2375 ml  Net -627.5 ml    Doppler DP/ PT Skin is warm.  Positive edema, compartments soft. Abdomin soft  Assessment/Planning: Procedure(s): EVACUATION HEMATOMA  3 Days Post-OpSurgeon(s): Fransisco Hertz, MD D/C foley today Plan possible D/C NG tube feeding tomorrow.  Good PO intake documented. Confusion has subsided and patient is alert and oriented x 3 Continue antibiotics cultures grew Serratia SCDs in place  Ambulate daily astolerates Order PT to start tomorrow    [name] [facility] 02/15/2013 9:30 AM --  Laboratory Lab Results:  Recent Labs  02/14/13 0430 02/15/13 0340  WBC 14.7* 13.8*  HGB 10.0* 9.6*  HCT 29.5* 28.8*  PLT 168 165   BMET  Recent Labs  02/14/13 0430 02/15/13 0340  NA 141 138  K 3.8 3.9  CL 98 97  CO2 33* 32  GLUCOSE 301* 221*  BUN 69* 57*  CREATININE 2.19* 2.09*  CALCIUM 7.6* 7.7*    COAG Lab Results  Component Value Date   INR 1.09 01/13/2013   INR 1.03 04/16/2012   INR 1.07 05/17/2011   No results found for this basename: PTT    I agree with the above.  The patient is much more coherent this morning.  She is somewhat disappointed that her feeding tube will not be removed today because she has really worked on her oral intake.  She will be evaluated by Dr. Darrick Penna tomorrow for possible removal if she continues to eat adequately.  Her Foley will be removed today.  Her wound VAC in the right groin wound appeared to be stable.  Antibiotics will be continued for Serratia within her groin culture.  Ambulation will be encouraged today  Durene Cal

## 2013-02-16 LAB — CBC
MCH: 30.6 pg (ref 26.0–34.0)
MCHC: 33 g/dL (ref 30.0–36.0)
MCV: 92.7 fL (ref 78.0–100.0)
Platelets: 149 10*3/uL — ABNORMAL LOW (ref 150–400)
RDW: 15 % (ref 11.5–15.5)
WBC: 14 10*3/uL — ABNORMAL HIGH (ref 4.0–10.5)

## 2013-02-16 LAB — BASIC METABOLIC PANEL
CO2: 32 mEq/L (ref 19–32)
Calcium: 8.6 mg/dL (ref 8.4–10.5)
Creatinine, Ser: 2.12 mg/dL — ABNORMAL HIGH (ref 0.50–1.10)
GFR calc Af Amer: 27 mL/min — ABNORMAL LOW (ref >90)
GFR calc non Af Amer: 23 mL/min — ABNORMAL LOW (ref >90)
Sodium: 139 mEq/L (ref 135–145)

## 2013-02-16 LAB — GLUCOSE, CAPILLARY
Glucose-Capillary: 157 mg/dL — ABNORMAL HIGH (ref 70–99)
Glucose-Capillary: 184 mg/dL — ABNORMAL HIGH (ref 70–99)
Glucose-Capillary: 201 mg/dL — ABNORMAL HIGH (ref 70–99)

## 2013-02-16 MED ORDER — ADULT MULTIVITAMIN W/MINERALS CH
1.0000 | ORAL_TABLET | Freq: Every day | ORAL | Status: DC
  Administered 2013-02-16 – 2013-02-27 (×12): 1 via ORAL
  Filled 2013-02-16 (×12): qty 1

## 2013-02-16 MED ORDER — ENSURE COMPLETE PO LIQD
237.0000 mL | Freq: Three times a day (TID) | ORAL | Status: DC
  Administered 2013-02-16 – 2013-02-19 (×7): 237 mL via ORAL

## 2013-02-16 MED ORDER — MORPHINE SULFATE 2 MG/ML IJ SOLN
0.5000 mg | Freq: Once | INTRAMUSCULAR | Status: AC
  Administered 2013-02-16: 0.5 mg via INTRAVENOUS
  Filled 2013-02-16: qty 1

## 2013-02-16 MED ORDER — PRO-STAT SUGAR FREE PO LIQD
30.0000 mL | Freq: Three times a day (TID) | ORAL | Status: DC
  Administered 2013-02-16 – 2013-02-27 (×17): 30 mL via ORAL
  Filled 2013-02-16 (×35): qty 30

## 2013-02-16 NOTE — Progress Notes (Signed)
[facility] [place]  Progress Note Bypass Surgery  Date of Surgery: 02/06/2013 - 02/12/2013  Procedure(s): EVACUATION HEMATOMA Surgeon: Surgeon(s): Fransisco Hertz, MD  4 Days Post-Op  History of Present Illness  CHANTELL KUNKLER is a 67 y.o. female who is  4 Days Post-Op. The patient's pre-op symptoms of pain are Improved . Patients pain is well controlled.    VASC. LAB Studies:        ABI: Right 0.97;  Left 0.97;   Imaging: No results found.  Significant Diagnostic Studies: CBC Lab Results  Component Value Date   WBC 14.0* 02/16/2013   HGB 10.1* 02/16/2013   HCT 30.6* 02/16/2013   MCV 92.7 02/16/2013   PLT 149* 02/16/2013    BMET    Component Value Date/Time   NA 139 02/16/2013 0435   K 5.0 02/16/2013 0435   CL 99 02/16/2013 0435   CO2 32 02/16/2013 0435   GLUCOSE 139* 02/16/2013 0435   BUN 50* 02/16/2013 0435   CREATININE 2.12* 02/16/2013 0435   CALCIUM 8.6 02/16/2013 0435   GFRNONAA 23* 02/16/2013 0435   GFRAA 27* 02/16/2013 0435    COAG Lab Results  Component Value Date   INR 1.09 01/13/2013   INR 1.03 04/16/2012   INR 1.07 05/17/2011   No results found for this basename: PTT    Physical Examination  BP Readings from Last 3 Encounters:  02/16/13 154/72  02/16/13 154/72  02/16/13 154/72   Temp Readings from Last 3 Encounters:  02/16/13 98.2 F (36.8 C) Oral  02/16/13 98.2 F (36.8 C) Oral  02/16/13 98.2 F (36.8 C) Oral   SpO2 Readings from Last 3 Encounters:  02/16/13 96%  02/16/13 96%  02/16/13 96%   Pulse Readings from Last 3 Encounters:  02/16/13 82  02/16/13 82  02/16/13 82    Pt is A&O x 3 right lower extremity: Incision/s is/are clean,dry.intact, and  healing without hematoma, erythema or drainage Limb is warm; with good color Right groin wound vac in place out put 75 cc 02/15/2013 and 50 cc since MN.    Assessment/Plan: Pt. Doing well Post-op pain is controlled Wounds are healing well distally,  wound vac in place right groin PT/OT for ambulation Plan possible D/C NG tube feeding today. Good PO intake documented. Confusion has subsided and patient is alert and oriented x 3  Continue antibiotics cultures grew Serratia  SCDs in place    [place], [facility]  02/16/2013 7:31 AM

## 2013-02-16 NOTE — Progress Notes (Signed)
Subjective:  Awake alert and talkative Thinks her "wound vac is gross but she can stand it" Voiding well with foley out and had good BM Will be staying with son at discharge  Objective  Weight change: -0.5 kg (-1 lb 1.6 oz)  Intake/Output Summary (Last 24 hours) at 02/16/13 1033 Last data filed at 02/16/13 0918  Gross per 24 hour  Intake 1487.5 ml  Output   1550 ml  Net  -62.5 ml   Physical Exam:  Blood pressure 151/74, pulse 81, temperature 98.2 F (36.8 C), temperature source Oral, resp. rate 21, height 5' (1.524 m), weight 77.3 kg (170 lb 6.7 oz), SpO2 94.00%. NAD Awake, alert and appropriate CV:RRR, nl s1s2  LUNGS: relatively clear  EXT: no LEE  ABD: soft, nontender  SKIN bandages noted. Wound vac in place  NEURO: oriented to self, location  GU: foley has been removed   Recent Labs Lab 02/10/13 0440 02/11/13 1123 02/12/13 0550 02/13/13 0755 02/13/13 1511 02/13/13 2033 02/14/13 0430 02/15/13 0340 02/16/13 0435  NA 141 144 144 157* 141 137 141 138 139  K 3.8 3.5 4.1 4.2 3.8  --  3.8 3.9 5.0  CL 97 101 100 92* 96  --  98 97 99  CO2 29 32 32 36* 35*  --  33* 32 32  GLUCOSE 234* 167* 338* 221* 233*  --  301* 221* 139*  BUN 80* 77* 81* 77* 74*  --  69* 57* 50*  CREATININE 3.76* 2.77* 2.65* 2.51* 2.38*  --  2.19* 2.09* 2.12*  CALCIUM 7.8* 8.4 8.1* 8.0* 8.1*  --  7.6* 7.7* 8.6  PHOS 6.3*  --   --   --   --   --   --   --   --     Recent Labs Lab 02/10/13 0440 02/13/13 0755  AST  --  11  ALT  --  12  ALKPHOS  --  47  BILITOT  --  0.4  PROT  --  5.4*  ALBUMIN 2.1* 1.9*    Recent Labs Lab 02/10/13 0440  02/13/13 0755 02/14/13 0430 02/15/13 0340 02/16/13 0435  WBC 12.9*  < > 14.5* 14.7* 13.8* 14.0*  NEUTROABS 10.9*  --   --   --   --   --   HGB 10.8*  < > 10.3* 10.0* 9.6* 10.1*  HCT 31.4*  < > 30.7* 29.5* 28.8* 30.6*  MCV 86.5  < > 88.5 88.9 91.1 92.7  PLT 179  < > 155 168 165 149*  < > = values in this interval not displayed.   Recent Labs Lab  02/15/13 1627 02/15/13 2035 02/15/13 2358 02/16/13 0405 02/16/13 0802  GLUCAP 179* 239* 184* 136* 157*    Medications: . feeding supplement (JEVITY 1.2 CAL) 1,000 mL (02/15/13 1218)   . ipratropium  0.5 mg Nebulization BID   And  . albuterol  2.5 mg Nebulization BID  . aspirin  81 mg Oral Daily  . atorvastatin  20 mg Oral q1800  . budesonide-formoterol  2 puff Inhalation BID  . docusate sodium  100 mg Oral Daily  . feeding supplement (ENSURE)  1 Container Oral TID BM  . feeding supplement (PRO-STAT SUGAR FREE 64)  30 mL Per Tube TID  . insulin aspart  0-15 Units Subcutaneous Q4H  . insulin glargine  28 Units Subcutaneous Daily  . pantoprazole  40 mg Oral Daily  . piperacillin-tazobactam (ZOSYN)  IV  3.375 g Intravenous Q8H   . feeding  supplement (JEVITY 1.2 CAL) 1,000 mL (02/15/13 1218)   I  have reviewed scheduled and prn medications.  ASSESSMENT/RECOMMENDATIONS 64F w/ PVD s/p Fem-Pop bypass 9/16 complicated  by wound infection with Serratia s/p OR debridement 10/3,10/8,  evacuation of hematoma 10/9;  Known CKD with BL SCr 1.5 in September 2014 with AKI -  likely ATN. Peak creatinine 4.73 on 10/5 - recovering.    Assessment/Plan  1. AKI on CKD: Likely ATN. Atheroembolic disease considered but no eosinophilia on CBC, complement levels normal. In recovery phase. Foley out.  Creatinine down to 2.1  Will have f/u appt with Dr. Sabra Heck at [facility] on 03/12/13 at 1:15 PM  as pt w/ some CKD at baseline.    2. Hypernatremia: Resolved  3. Hypoxia, Tachypnea: Now on scheduled scheduled A/A nebs and prn albuterol as well. Currently off furosemide. Doing well on 1L [place], uses O2 at home  4. S/p F-P bypass and inguinal wound infection with multiple debreidements and w/ Serratia; per [facility].  5. Volume status: No IVFs. Taking PO well.  6. Delirium:Resolved  Cymbalta was also removed.  7. Acidosis: Resolved  At this time will sign off. Appt with Dr. Marisue Humble as noted above.   Heidi Bal,  MD [facility] [phone] pager 02/16/2013, 10:33 AM

## 2013-02-16 NOTE — Progress Notes (Signed)
Physical Therapy Treatment Patient Details Name: [name] MRN: [id number] DOB: [birth year] Today's Date: 02/16/2013 Time:  -     PT Assessment / Plan / Recommendation  History of Present Illness Pt with infection to R going from previous R fem to BK pop BPG   PT Comments   Pt with significant cognitive and functional improvement this date. Pt with 24/7 assist/supervision available and pending con't progression anticipate pt to be able to return home with family. Pt beginning to amb this date but con't to be deconditioned with decreased activity tolerance. Son reports he took care of patient at home with a wound vac on her heart for 2 months and feels comfortable taking her home. PT to con't to monitor patients progress.   Follow Up Recommendations  Home health PT;Supervision/Assistance - 24 hour     Does the patient have the potential to tolerate intense rehabilitation     Barriers to Discharge        Equipment Recommendations  Rolling walker with 5" wheels    Recommendations for Other Services    Frequency Min 3X/week   Progress towards PT Goals Progress towards PT goals: Progressing toward goals  Plan Discharge plan needs to be updated    Precautions / Restrictions Precautions Precautions: Fall Precaution Comments: wound vac R going Restrictions Weight Bearing Restrictions: No   Pertinent Vitals/Pain Pt denies pain    Mobility  Bed Mobility Bed Mobility: Not assessed Transfers Transfers: Sit to Stand;Stand to Sit Sit to Stand: 4: Min assist;From bed Stand to Sit: 4: Min assist;To chair/3-in-1 Details for Transfer Assistance: v/c's for safe hand placement, increased time Ambulation/Gait Ambulation/Gait Assistance: 4: Min assist (2nd person for lines and chair follow) Ambulation Distance (Feet): 20 Feet (x2, seated rest break in between) Assistive device: Rolling walker Ambulation/Gait Assistance Details: v/c's for walker management v/c's for safe hand  placement. + SOB/onset of fatigue Gait Pattern: Step-through pattern;Decreased stride length;Shuffle;Trunk flexed Gait velocity: decreased    Exercises     PT Diagnosis:    PT Problem List:   PT Treatment Interventions:     PT Goals (current goals can now be found in the care plan section)    Visit Information  Last PT Received On: 02/16/13 Assistance Needed: +2 (for lines/chair follow) History of Present Illness: Pt with infection to R going from previous R fem to BK pop BPG    Subjective Data      Cognition  Cognition Arousal/Alertness: Awake/alert Behavior During Therapy: WFL for tasks assessed/performed Overall Cognitive Status: Within Functional Limits for tasks assessed Area of Impairment: Problem solving Following Commands: Follows one step commands consistently Problem Solving: Slow processing General Comments: much improved from last week    Balance     End of Session PT - End of Session Equipment Utilized During Treatment: Gait belt Activity Tolerance: Patient limited by fatigue Patient left: in chair;with call bell/phone within reach;with family/visitor present;with nursing/sitter in room Nurse Communication: Mobility status   GP     Marcene Brawn 02/16/2013, 8:48 AM Lewis Shock, PT, DPT Pager #: [phone] Office #: [phone]

## 2013-02-16 NOTE — Progress Notes (Signed)
ANTIBIOTIC CONSULT NOTE - Follow Up  Pharmacy Consult for Zosyn Indication: R groin abscess  Allergies  Allergen Reactions  . Sulfa Antibiotics Anaphylaxis and Shortness Of Breath  . Codeine Nausea Only and Other (See Comments)    Severe headache  . Ivp Dye [Iodinated Diagnostic Agents] Hives and Other (See Comments)    Severe anxiety  . Penicillins Swelling and Other (See Comments)    "huge sores". Pt has tolerated both Zosyn and Keflex in the past.    Patient Measurements: Height: 5' (152.4 cm) Weight: 170 lb 6.7 oz (77.3 kg) IBW/kg (Calculated) : 45.5  Vital Signs: Temp: 98.2 F (36.8 C) (10/13 0408) Temp src: Oral (10/13 0408) BP: 151/74 mmHg (10/13 0806) Pulse Rate: 81 (10/13 0807) Intake/Output from previous day: 10/12 0701 - 10/13 0700 In: 1695 [P.O.:720; NG/GT:825; IV Piggyback:150] Out: 1800 [Urine:1750; Drains:50] Intake/Output from this shift: Total I/O In: 285 [P.O.:240; NG/GT:45] Out: 50 [Drains:50]  Labs:  Recent Labs  02/14/13 0430 02/15/13 0340 02/16/13 0435  WBC 14.7* 13.8* 14.0*  HGB 10.0* 9.6* 10.1*  PLT 168 165 149*  CREATININE 2.19* 2.09* 2.12*   Estimated Creatinine Clearance: 24 ml/min (by C-G formula based on Cr of 2.12). No results found for this basename: VANCOTROUGH, VANCOPEAK, VANCORANDOM, GENTTROUGH, GENTPEAK, GENTRANDOM, TOBRATROUGH, TOBRAPEAK, TOBRARND, AMIKACINPEAK, AMIKACINTROU, AMIKACIN,  in the last 72 hours   Microbiology: Recent Results (from the past 720 hour(s))  WOUND CULTURE     Status: None   Collection Time    02/06/13 11:31 AM      Result Value Range Status   Specimen Description WOUND RIGHT GROIN   Final   Special Requests PT ON VANCOMYCIN   Final   Gram Stain     Final   Value: NO WBC SEEN     NO SQUAMOUS EPITHELIAL CELLS SEEN     MODERATE GRAM NEGATIVE RODS     Performed at [facility]   Culture     Final   Value: MODERATE SERRATIA MARCESCENS     Performed at [facility]   Report Status  02/09/2013 FINAL   Final   Organism ID, Bacteria SERRATIA MARCESCENS   Final  URINE CULTURE     Status: None   Collection Time    02/08/13  3:26 PM      Result Value Range Status   Specimen Description URINE, CLEAN CATCH   Final   Special Requests NONE   Final   Culture  Setup Time     Final   Value: 02/08/2013 23:11     Performed at [facility] Count     Final   Value: NO GROWTH     Performed at [facility]   Culture     Final   Value: NO GROWTH     Performed at [facility]   Report Status 02/10/2013 FINAL   Final  WOUND CULTURE     Status: None   Collection Time    02/11/13  1:07 PM      Result Value Range Status   Specimen Description WOUND RIGHT GROIN   Final   Special Requests     Final   Value: PATIENT ON FOLLOWING ZOSYN RIGHT GROIN WOUND FOR CULTURE   Gram Stain     Final   Value: FEW WBC PRESENT, PREDOMINANTLY PMN     NO SQUAMOUS EPITHELIAL CELLS SEEN     NO ORGANISMS SEEN     Performed at [facility]   Culture     Final   Value: FEW SERRATIA MARCESCENS     Performed at [facility]   Report Status 02/14/2013 FINAL   Final   Organism ID, Bacteria SERRATIA MARCESCENS   Final    Assessment: Pt on Zosyn Day #10 for R groin abscess. S/p OR debridement 10/3 and 10/9,Wounds are clean and healing well, wound vac in place right groin. WBC 14. Afeb. Renal function stable, SCr trending down, stable 4.29>>2.12  Vanc 10/3 >>10/9 Zosyn 10/3 >>  10/3 R groin abscess >> Serratia marcescens (Sens - Cefepime/Ceftaz/Roceph/Cipro/Gent/Tobra/Bact; Resis Ancef/Cefoxitin) 10/5 Urine>>neg 10/8 R groin abscess>>Serratia marcescens - identical sensitivities   Goal of Therapy:  Eradication of infection  Plan:  - Continue to Zosyn 3.375 gm IV q8h. - Could consider narrow antibiotics to IV rocephin 2 gm Q 24 hrs. If PO antibiotics desired, may use ciprofloxacin 750 Q 24 hrs - Monitor for changes in renal function.  Bayard Hugger,  PharmD, BCPS  Clinical Pharmacist  Pager: [phone]   02/16/2013  11:26 AM

## 2013-02-16 NOTE — Progress Notes (Signed)
VVS Progress:  Wound vac changed. Wound granulating in. Mod area of fat necrosis upper portion of wound. No debridement needed. No active bleeding noted.

## 2013-02-16 NOTE — Progress Notes (Addendum)
NUTRITION CONSULT/FOLLOW UP  Intervention:    Discontinue calorie count.  Change to Ensure Complete PO TID to maximize protein intake, each supplement provides 350 kcal and 13 grams of protein. Pro-stat 30 ml PO TID to maximize protein intake; each serving provides 100 kcals and 15 gm protein.  Multivitamin one tablet daily.  Nutrition Dx:   Inadequate oral intake now related to delirium, poor appetite as evidenced by need for EN support, resolved.  New Goal:   Successful transition to oral diet to meet >/= 90% of estimated nutrition needs, progressing.  Monitor:   PO intake, weight trend, labs.  Assessment:   Patient with PMH of stroke and CABG; recently underwent right femoral to below-knee popliteal bypass on 9/16; began complaining of swelling in right groin and some redness; presented to the Vascular Surgery office with drainage from her right groin.   Patient s/p procedure 10/3:  DEBRIDEMENT RIGHT GROIN  PLACEMENT OF VAC DRESSING   Patient s/p procedure 10/8:  DEBRIDEMENT RIGHT GROIN & WOUND VAC APPLICATION  Patient s/p procedure 10/9: EVACUATION HEMATOMA (Right)   Calorie count has been ongoing over the weekend. Suspect that patient is meeting ~80-90% of her estimated energy needs, but less than 50% of estimated protein needs with current intake of meals and supplements.   Diet Order: Dysphagia 3 with thin liquids, Ensure Pudding TID  10/11 Dinner: 285 kcals, 13 gm protein 10/12 Breakfast: 365 kcals, 10 gm protein 10/12 Lunch: 446 kcals, 13 gm protein 10/12 Dinner: 135 kcals, 6 gm protein 10/13 Breakfast: 350 kcals, 12 gm protein 10/13 Lunch 190 kcals, 6 gm protein 10/13 RN reports that patient has had an Ensure liquid supplement today.  TF has been off since yesterday; NGT was removed.  Height: Ht Readings from Last 1 Encounters:  02/06/13 5' (1.524 m)    Weight Status:   Wt Readings from Last 1 Encounters:  02/16/13 170 lb 6.7 oz (77.3 kg)     Re-estimated needs:  Kcal: 1500-1700 Protein: 100-110 gm Fluid: 1.5-1.7 L  Skin: wound VAC to right groin; stage III pressure ulcer to right ankle  Diet Order: Dysphagia 3, thin liquids   Intake/Output Summary (Last 24 hours) at 02/16/13 1419 Last data filed at 02/16/13 0918  Gross per 24 hour  Intake 1032.5 ml  Output   1350 ml  Net -317.5 ml    Labs:   Recent Labs Lab 02/10/13 0440  02/14/13 0430 02/15/13 0340 02/16/13 0435  NA 141  < > 141 138 139  K 3.8  < > 3.8 3.9 5.0  CL 97  < > 98 97 99  CO2 29  < > 33* 32 32  BUN 80*  < > 69* 57* 50*  CREATININE 3.76*  < > 2.19* 2.09* 2.12*  CALCIUM 7.8*  < > 7.6* 7.7* 8.6  PHOS 6.3*  --   --   --   --   GLUCOSE 234*  < > 301* 221* 139*  < > = values in this interval not displayed.  CBG (last 3)   Recent Labs  02/16/13 0405 02/16/13 0802 02/16/13 1243  GLUCAP 136* 157* 201*    Scheduled Meds: . ipratropium  0.5 mg Nebulization BID   And  . albuterol  2.5 mg Nebulization BID  . aspirin  81 mg Oral Daily  . atorvastatin  20 mg Oral q1800  . budesonide-formoterol  2 puff Inhalation BID  . docusate sodium  100 mg Oral Daily  . feeding supplement (ENSURE)  1 Container Oral TID BM  . feeding supplement (PRO-STAT SUGAR FREE 64)  30 mL Per Tube TID  . insulin aspart  0-15 Units Subcutaneous Q4H  . insulin glargine  28 Units Subcutaneous Daily  . pantoprazole  40 mg Oral Daily  . piperacillin-tazobactam (ZOSYN)  IV  3.375 g Intravenous Q8H    Continuous Infusions: . feeding supplement (JEVITY 1.2 CAL) 1,000 mL (02/15/13 1218)    Joaquin Courts, RD, LDN, CNSC Pager [phone] After Hours Pager [phone]

## 2013-02-17 LAB — BASIC METABOLIC PANEL
BUN: 48 mg/dL — ABNORMAL HIGH (ref 6–23)
CO2: 30 mEq/L (ref 19–32)
Calcium: 8.8 mg/dL (ref 8.4–10.5)
Creatinine, Ser: 2.15 mg/dL — ABNORMAL HIGH (ref 0.50–1.10)
GFR calc non Af Amer: 23 mL/min — ABNORMAL LOW (ref >90)
Glucose, Bld: 106 mg/dL — ABNORMAL HIGH (ref 70–99)
Potassium: 5 mEq/L (ref 3.5–5.1)
Sodium: 141 mEq/L (ref 135–145)

## 2013-02-17 LAB — GLUCOSE, CAPILLARY
Glucose-Capillary: 103 mg/dL — ABNORMAL HIGH (ref 70–99)
Glucose-Capillary: 138 mg/dL — ABNORMAL HIGH (ref 70–99)
Glucose-Capillary: 95 mg/dL (ref 70–99)

## 2013-02-17 LAB — CBC
Hemoglobin: 9.5 g/dL — ABNORMAL LOW (ref 12.0–15.0)
MCH: 30.4 pg (ref 26.0–34.0)
MCHC: 32.3 g/dL (ref 30.0–36.0)
MCV: 94.2 fL (ref 78.0–100.0)
RBC: 3.12 MIL/uL — ABNORMAL LOW (ref 3.87–5.11)

## 2013-02-17 MED ORDER — CARVEDILOL 12.5 MG PO TABS
12.5000 mg | ORAL_TABLET | Freq: Two times a day (BID) | ORAL | Status: DC
  Administered 2013-02-17 – 2013-02-27 (×20): 12.5 mg via ORAL
  Filled 2013-02-17 (×22): qty 1

## 2013-02-17 MED ORDER — CIPROFLOXACIN HCL 750 MG PO TABS
750.0000 mg | ORAL_TABLET | Freq: Every day | ORAL | Status: DC
  Administered 2013-02-18 – 2013-02-27 (×10): 750 mg via ORAL
  Filled 2013-02-17 (×11): qty 1

## 2013-02-17 MED ORDER — ENOXAPARIN SODIUM 30 MG/0.3ML ~~LOC~~ SOLN
30.0000 mg | SUBCUTANEOUS | Status: DC
  Administered 2013-02-17 – 2013-02-26 (×10): 30 mg via SUBCUTANEOUS
  Filled 2013-02-17 (×11): qty 0.3

## 2013-02-17 MED ORDER — DULOXETINE HCL 60 MG PO CPEP
60.0000 mg | ORAL_CAPSULE | Freq: Every day | ORAL | Status: DC
  Administered 2013-02-18 – 2013-02-27 (×10): 60 mg via ORAL
  Filled 2013-02-17 (×11): qty 1

## 2013-02-17 MED ORDER — CLOTRIMAZOLE 1 % VA CREA
1.0000 | TOPICAL_CREAM | Freq: Every day | VAGINAL | Status: AC
  Administered 2013-02-17 – 2013-02-21 (×5): 1 via VAGINAL
  Filled 2013-02-17: qty 45

## 2013-02-17 MED ORDER — PREGABALIN 50 MG PO CAPS
50.0000 mg | ORAL_CAPSULE | Freq: Two times a day (BID) | ORAL | Status: DC
  Administered 2013-02-17 – 2013-02-27 (×20): 50 mg via ORAL
  Filled 2013-02-17 (×20): qty 1

## 2013-02-17 MED ORDER — PANTOPRAZOLE SODIUM 40 MG PO TBEC: 40.0000 mg | DELAYED_RELEASE_TABLET | Freq: Every day | ORAL | Status: DC

## 2013-02-17 MED ORDER — LOSARTAN POTASSIUM 50 MG PO TABS
50.0000 mg | ORAL_TABLET | Freq: Every day | ORAL | Status: DC
  Administered 2013-02-18: 50 mg via ORAL
  Filled 2013-02-17 (×3): qty 1

## 2013-02-17 NOTE — Progress Notes (Addendum)
  VASCULAR SURGERY BYPASS PROGRESS NOTE   5 Days Post-Op  SUBJECTIVE: pt doing well alert., forgetful but appropriate. No C/O this am. Eating well. Wound vac -100cc/24 hours serous drainage  PHYSICAL EXAM: BP Readings from Last 3 Encounters:  02/17/13 149/69  02/17/13 149/69  02/17/13 149/69   Temp Readings from Last 3 Encounters:  02/17/13 97.9 F (36.6 C) Oral  02/17/13 97.9 F (36.6 C) Oral  02/17/13 97.9 F (36.6 C) Oral   Pulse Readings from Last 3 Encounters:  02/17/13 73  02/17/13 73  02/17/13 73   SpO2 Readings from Last 3 Encounters:  02/17/13 99%  02/17/13 99%  02/17/13 99%     Intake/Output Summary (Last 24 hours) at 02/17/13 1610 Last data filed at 02/17/13 0444  Gross per 24 hour  Intake    820 ml  Output    950 ml  Net   -130 ml    Extremities: Incisions clean, dry and intact wound vac in place on right groin Legs warm with good motion and sensation  LABS: Lab Results  Component Value Date   WBC 13.1* 02/17/2013   HGB 9.5* 02/17/2013   HCT 29.4* 02/17/2013   MCV 94.2 02/17/2013   PLT 154 02/17/2013   Lab Results  Component Value Date   CREATININE 2.15* 02/17/2013     ASSESSMENT:  Open right groin wound - beginning to granulate  PLAN:  Ambulate  Wound Management: Vac change M,W,F  DVT prophylaxis: will restart low dose lovenox as Cr 2.15  CKD - stable  Transfer to floor today  HH for wound vac changes  Pt narcotic delirium resolving to baseline  Agree with the above.  Patient feels her abdomen is full.  She is passing flatus and having solid bowel movements.  Legs are warm.  Vac in right groin in place with minimal drainage.  Patient wants to go home.  Will discuss with PT/OT for dispo to home, maybe tomorrow or Thursday.  Patient is eating well after NG removed  Wells Brabham

## 2013-02-17 NOTE — Progress Notes (Signed)
Patient evaluated for community based chronic disease management services with [facility] Care Management Program as a benefit of patient's [facility] Medicare Insurance. Spoke with patient and son at bedside to explain [facility] Care Management services.  Patient has declined [facility] at this time but would like [facility] services upon discharge.  Left contact information and [facility] literature with patient. Made inpatient Case Manager aware that [facility] Care Management has evaluated. Of note, [facility] Care Management services does not replace or interfere with any services that are arranged by inpatient case management or social work.  For additional questions or referrals please contact Anibal Henderson BSN RN [facility] Liaison at [phone].

## 2013-02-17 NOTE — Progress Notes (Signed)
Physical Therapy Treatment Patient Details Name: [name] MRN: [id number] DOB: 23-Sep-1945 Today's Date: 02/17/2013 Time: 1026-1100 PT Time Calculation (min): 34 min  PT Assessment / Plan / Recommendation  History of Present Illness Pt with infection to R going from previous R fem to BK pop BPG   PT Comments   Spoke at length with Case management, pt, and grandson Alex regarding d/c planning/recommendations. Alex assures that his sister will be able to assist pt 24/7 and feels comfortable managing wound vac since they had to do it when she had open heart surgery. Pt con't to progress from mobility stand point but remains deconditioned with decreased activity tolerance. PT to con't to follow to progress mobility.   Follow Up Recommendations  Home health PT;Supervision/Assistance - 24 hour     Does the patient have the potential to tolerate intense rehabilitation     Barriers to Discharge        Equipment Recommendations  Rolling walker with 5" wheels    Recommendations for Other Services    Frequency Min 3X/week   Progress towards PT Goals Progress towards PT goals: Progressing toward goals  Plan Current plan remains appropriate    Precautions / Restrictions Precautions Precautions: Fall Precaution Comments: wound vac to R groin Restrictions Weight Bearing Restrictions: No   Pertinent Vitals/Pain Denies pain    Mobility  Bed Mobility Bed Mobility: Not assessed Transfers Transfers: Sit to Stand;Stand to Sit Sit to Stand: 4: Min guard;From chair/3-in-1 Stand to Sit: 4: Min guard;To chair/3-in-1 Details for Transfer Assistance: v/c's for safe hand placement Ambulation/Gait Ambulation/Gait Assistance: 4: Min guard Ambulation Distance (Feet): 50 Feet (x1, 20x1) Assistive device: Rolling walker Ambulation/Gait Assistance Details: v/c's to stay in walker and look foward, + onset of fatigue/SOB, SpO2 > 97% on RA Gait Pattern: Step-through pattern;Decreased stride  length;Shuffle;Trunk flexed Gait velocity: decreased    Exercises     PT Diagnosis:    PT Problem List:   PT Treatment Interventions:     PT Goals (current goals can now be found in the care plan section) Acute Rehab PT Goals Patient Stated Goal: home with grandson  Visit Information  Last PT Received On: 02/17/13 Assistance Needed: +1 History of Present Illness: Pt with infection to R going from previous R fem to BK pop BPG    Subjective Data  Subjective: Pt received up in chair with grandson present Patient Stated Goal: home with grandson   Cognition  Cognition Arousal/Alertness: Awake/alert Behavior During Therapy: WFL for tasks assessed/performed Overall Cognitive Status: Within Functional Limits for tasks assessed Area of Impairment: Problem solving Current Attention Level: Alternating Following Commands: Follows one step commands consistently Problem Solving: Slow processing General Comments: much improved from last week    Balance  Balance Balance Assessed: Yes Static Sitting Balance Static Sitting - Balance Support: Feet supported;No upper extremity supported Static Sitting - Level of Assistance: 6: Modified independent (Device/Increase time) Static Sitting - Comment/# of Minutes: 10 Static Standing Balance Static Standing - Balance Support: During functional activity Static Standing - Level of Assistance: 5: Stand by assistance Static Standing - Comment/# of Minutes: 2 while bathing and again while toileting.  End of Session PT - End of Session Equipment Utilized During Treatment: Gait belt Activity Tolerance: Patient limited by fatigue Patient left: in chair;with call bell/phone within reach;with family/visitor present;with nursing/sitter in room Nurse Communication: Mobility status   GP     Marcene Brawn 02/17/2013, 11:26 AM  Lewis Shock, PT, DPT Pager #: [phone]  Office #: [phone]

## 2013-02-17 NOTE — Progress Notes (Signed)
Utilization review completed.  

## 2013-02-17 NOTE — Care Management Note (Unsigned)
Page 1 of 2   02/26/2013     2:40:47 PM   CARE MANAGEMENT NOTE 02/26/2013  Patient:  [name], [name]   Account Number:  [id number]  Date Initiated:  02/09/2013  Documentation initiated by:  Donn Pierini  Subjective/Objective Assessment:   Pt admitted with right groin graft infection s/p [name]&D with wound VAC placement     Action/Plan:   PTA pt was at home with [facility] services through [facility] for RN/PT/OT/CSW- NCM to follow for d/c needs- pt may need higher level of care.   Anticipated DC Date:  02/20/2013   Anticipated DC Plan:  HOME W HOME HEALTH SERVICES  In-house referral  Clinical Social Worker      DC Planning Services  CM consult      [facility] Choice  HOME HEALTH   Choice offered to / List presented to:  C-1 Patient   DME arranged  VAC      DME agency  [facility]     HH arranged  HH-1 RN  HH-2 PT  HH-3 OT  HH-6 SOCIAL WORKER      HH agency  [facility]   Status of service:  In process, will continue to follow Medicare Important Message given?   (If response is "NO", the following Medicare IM given date fields will be blank) Date Medicare IM given:   Date Additional Medicare IM given:    Discharge Disposition:  HOME W HOME HEALTH SERVICES  Per UR Regulation:  Reviewed for med. necessity/level of care/duration of stay  If discussed at Long Length of Stay Meetings, dates discussed:   02/12/2013  02/17/2013    Comments:  02/26/13 Sabrin Dunlevy,RN,BSN [phone] ANTICIPATE DC ON 10/24; HH ARRANGED AND HOME VAC IN PATIENT'S ROOM.  02/23/13 Yuto Cajuste,RN,BSN [phone] WOUND DEBRIDEMENT TODAY IN OR.  WILL FOLLOW PROGRESS.  02-19-13 1:45pm Johny Shears - [phone] Home VAC in room.  For dressing change tomorrow to determine when she goes home.  02-18-13 3:15pm Avie Arenas, RNBSN [phone] Referral placed for [facility] and DME - no DME needed. HH PT/OT, nurse and SW resumed.  PHYSICIAN NEEDS TO SIGN VAC SHEET ON FRONT OF CHART AND FILL IN  MEASUREMENTS AND EQUIPMENT NEEDED.  Nurse notified also.  Sticky Note already placed in epic for reminder.  Referral placed to [facility] for resumption - now with wound VAC - M-W-F change.   02/17/13- 1100- Donn Pierini RN, BSN [phone] Referral for [facility] needs and Wound VAC- placed Wound VAC form on Shadow chart - Needs MD signature in order to fax for approval for home wound VAC----- spoke with pt and son - Alex at bedside-  per conversation pt states that she plans to return home with son Trinna Post whom she was living with PTA. Pt  has all needed DME at home that includes- RW, w/c, cane, grab bars, tub bench- pt does not want a BSC. Pt had [facility] in the home with [facility] after her last discharge and would like to continue services for [facility] with [facility]-  she will need resumption orders for HH-RN/PT/OT/CSW for discharge- Spoke with Darlin Drop with [facility] regarding potential d/c at the end of this week with wound VAC-    02/12/13- 1030- Donn Pierini RN, BSN [phone] Pt s/p repeat [name]&D- on 10/8- wound VAC on hold for now with bleeding issues- PT/OT following- Pt most likely will need SNF-will cont. to follow pt progression--pt had to return to OR late for rebleed- transfused 3 units and VAC  placed back on  02/10/13- 1420- Donn Pierini RN, BSN [phone] Plan for pt to return to OR in am for further [name]&D

## 2013-02-17 NOTE — Progress Notes (Signed)
Report given to [facility], RN, all questions answered.  Will transfer after finish lunch. Family at bedside no complaints.

## 2013-02-17 NOTE — Progress Notes (Signed)
Occupational Therapy Treatment Patient Details Name: [name] MRN: [id number] DOB: [birth year] Today's Date: 02/17/2013 Time: 7829-5621 OT Time Calculation (min): 31 min  OT Assessment / Plan / Recommendation  History of present illness Pt with infection to R going from previous R fem to BK pop BPG   OT comments  Pt very motivated to improve and did very well with LE adls today. Pt able to cross L leg over R to bathe and dress and almost was able to cross R over L.  Pt may not need AE unless just for convenience.  Pt fatigues quickly but very motivated to increase independence with all adls.  Follow Up Recommendations  SNF;Supervision/Assistance - 24 hour (Pt would like HHOT.Marland Kitchenwill continue to monitor dc needs)    Barriers to Discharge       Equipment Recommendations  None recommended by OT    Recommendations for Other Services    Frequency Min 2X/week   Progress towards OT Goals Progress towards OT goals: Progressing toward goals  Plan Discharge plan remains appropriate    Precautions / Restrictions Precautions Precautions: Fall Precaution Comments: wound vac R going Restrictions Weight Bearing Restrictions: No   Pertinent Vitals/Pain All vitals stable.  Pt on 1 L [place] O2.  C/O minor pain in R groin.    ADL  Eating/Feeding: Supervision/safety;Set up Where Assessed - Eating/Feeding: Chair Grooming: Set up;Supervision/safety Where Assessed - Grooming: Supported sitting Upper Body Bathing: Performed;Set up Where Assessed - Upper Body Bathing: Unsupported sitting Lower Body Bathing: Performed;Moderate assistance Where Assessed - Lower Body Bathing: Supported sit to stand Lower Body Dressing: Performed;Moderate assistance Where Assessed - Lower Body Dressing: Supported sit to stand Toilet Transfer: Control and instrumentation engineer Method: Surveyor, minerals: Materials engineer and Hygiene: Minimal assistance Where  Assessed - Engineer, mining and Hygiene: Standing Equipment Used: Gait belt;Rolling walker Transfers/Ambulation Related to ADLs: Pt took a few steps to commode with min guard.  Pt needs enouragement and reinforecment to try things on her own and then does very well. ADL Comments: Pt able to cross L leg over R for LE bathing and dressing and almost was able with the R LE.    OT Diagnosis:    OT Problem List:   OT Treatment Interventions:     OT Goals(current goals can now be found in the care plan section) Acute Rehab OT Goals Patient Stated Goal: to get home OT Goal Formulation: With patient Time For Goal Achievement: 02/28/13 Potential to Achieve Goals: Good ADL Goals Pt Will Perform Grooming: with supervision;standing Pt Will Perform Upper Body Bathing: with set-up;sitting Pt Will Perform Lower Body Bathing: with min assist;sit to/from stand;with adaptive equipment Pt Will Perform Upper Body Dressing: with mod assist;sitting Pt Will Transfer to Toilet: with supervision;ambulating;bedside commode Pt/caregiver will Perform Home Exercise Program: Increased strength;Both right and left upper extremity;With theraband;With Supervision  Visit Information  Last OT Received On: 02/17/13 Assistance Needed: +1 History of Present Illness: Pt with infection to R going from previous R fem to BK pop BPG    Subjective Data      Prior Functioning       Cognition  Cognition Arousal/Alertness: Awake/alert Behavior During Therapy: WFL for tasks assessed/performed Overall Cognitive Status: Within Functional Limits for tasks assessed Area of Impairment: Problem solving Current Attention Level: Alternating Problem Solving: Slow processing General Comments: much improved from last week    Mobility  Bed Mobility Bed Mobility: Not assessed Transfers Transfers: Sit to  Stand;Stand to Sit Sit to Stand: 4: Min guard;From chair/3-in-1 Stand to Sit: 4: Min guard;To  chair/3-in-1 Details for Transfer Assistance: Pt required cues on every transfer for hand placement but once hands placed safely pt only required assist with lines.    Exercises      Balance Balance Balance Assessed: Yes Static Sitting Balance Static Sitting - Balance Support: Feet supported;No upper extremity supported Static Sitting - Level of Assistance: 6: Modified independent (Device/Increase time) Static Sitting - Comment/# of Minutes: 10 Static Standing Balance Static Standing - Balance Support: During functional activity Static Standing - Level of Assistance: 5: Stand by assistance Static Standing - Comment/# of Minutes: 2 while bathing and again while toileting.   End of Session OT - End of Session Equipment Utilized During Treatment: Gait belt;Rolling walker Activity Tolerance: Patient tolerated treatment well Patient left: in chair;with call bell/phone within reach;with family/visitor present Nurse Communication: Mobility status  GO     Hope Budds 02/17/2013, 9:25 AM [phone]

## 2013-02-18 ENCOUNTER — Encounter: Payer: Self-pay | Admitting: Vascular Surgery

## 2013-02-18 LAB — GLUCOSE, CAPILLARY
Glucose-Capillary: 115 mg/dL — ABNORMAL HIGH (ref 70–99)
Glucose-Capillary: 143 mg/dL — ABNORMAL HIGH (ref 70–99)
Glucose-Capillary: 165 mg/dL — ABNORMAL HIGH (ref 70–99)
Glucose-Capillary: 69 mg/dL — ABNORMAL LOW (ref 70–99)

## 2013-02-18 LAB — BASIC METABOLIC PANEL
CO2: 28 mEq/L (ref 19–32)
Chloride: 102 mEq/L (ref 96–112)
Creatinine, Ser: 2.14 mg/dL — ABNORMAL HIGH (ref 0.50–1.10)
GFR calc Af Amer: 26 mL/min — ABNORMAL LOW (ref >90)
GFR calc non Af Amer: 23 mL/min — ABNORMAL LOW (ref >90)
Glucose, Bld: 152 mg/dL — ABNORMAL HIGH (ref 70–99)
Potassium: 5.3 mEq/L — ABNORMAL HIGH (ref 3.5–5.1)
Sodium: 139 mEq/L (ref 135–145)

## 2013-02-18 LAB — TYPE AND SCREEN
ABO/RH(D): A POS
Antibody Screen: NEGATIVE

## 2013-02-18 MED ORDER — ALPRAZOLAM 0.25 MG PO TABS: 0.5000 mg | ORAL_TABLET | Freq: Two times a day (BID) | ORAL | Status: DC | PRN

## 2013-02-18 MED ORDER — MORPHINE SULFATE 2 MG/ML IJ SOLN
1.0000 mg | INTRAMUSCULAR | Status: DC | PRN
  Administered 2013-02-25: 2 mg via INTRAVENOUS
  Filled 2013-02-18 (×2): qty 1

## 2013-02-18 MED ORDER — ATORVASTATIN CALCIUM 10 MG PO TABS
10.0000 mg | ORAL_TABLET | Freq: Every day | ORAL | Status: DC
  Administered 2013-02-18 – 2013-02-26 (×9): 10 mg via ORAL
  Filled 2013-02-18 (×10): qty 1

## 2013-02-18 MED ORDER — CIPROFLOXACIN HCL 750 MG PO TABS: 750.0000 mg | ORAL_TABLET | Freq: Every day | ORAL | Status: DC

## 2013-02-18 NOTE — Progress Notes (Addendum)
  VASCULAR SURGERY BYPASS PROGRESS NOTE   6 Days Post-Op debridement right groin wound  SUBJECTIVE: Pt states she feels good and is tolerating wound vac.  PHYSICAL EXAM: BP Readings from Last 3 Encounters:  02/18/13 157/77  02/18/13 157/77  02/18/13 157/77   Temp Readings from Last 3 Encounters:  02/18/13 97.6 F (36.4 C) Oral  02/18/13 97.6 F (36.4 C) Oral  02/18/13 97.6 F (36.4 C) Oral   Pulse Readings from Last 3 Encounters:  02/18/13 66  02/18/13 66  02/18/13 66   SpO2 Readings from Last 3 Encounters:  02/18/13 97%  02/18/13 97%  02/18/13 97%     Intake/Output Summary (Last 24 hours) at 02/18/13 1446 Last data filed at 02/18/13 1324  Gross per 24 hour  Intake    960 ml  Output   1551 ml  Net   -591 ml    Extremities: Incisions clean, dry and intact Right groin Wound is 7 x 6 x 2.5cm Exudative tissue more prominent in base of wound with mepatel dressing,  was more clean with just foam in wound  LABS: Lab Results  Component Value Date   WBC 13.1* 02/17/2013   HGB 9.5* 02/17/2013   HCT 29.4* 02/17/2013   MCV 94.2 02/17/2013   PLT 154 02/17/2013   Lab Results  Component Value Date   CREATININE 2.14* 02/18/2013   Lab Results  Component Value Date   INR 1.09 01/13/2013       ASSESSMENT: pt stable  More exudative tissue at base of wound than monday  PLAN:  Ambulate  Wound Management: wound vac - no mepatel to base of wound  Cont Cipro- serratia per culture  Agree with above.  Although wound is cleaner than last week there is still at least 50% of the wound that is not granulating.  She may need further debridement.  Will continue VAC for now and recheck on 10/17  Fabienne Bruns, MD [facility] Office: [phone] Pager: [phone]

## 2013-02-18 NOTE — Progress Notes (Signed)
Inpatient Diabetes Program Recommendations  AACE/ADA: New Consensus Statement on Inpatient Glycemic Control (2013)  Target Ranges:  Prepandial:   less than 140 mg/dL      Peak postprandial:   less than 180 mg/dL (1-2 hours)      Critically ill patients:  140 - 180 mg/dL  Results for MATIKA, BARTELL (MRN [id number]) as of 02/18/2013 10:39  Ref. Range 02/17/2013 18:20 02/17/2013 20:05 02/18/2013 00:01 02/18/2013 00:54 02/18/2013 04:13  Glucose-Capillary Latest Range: 70-99 mg/dL 409 (H) 95 69 (L) 92 811 (H)   Inpatient Diabetes Program Recommendations Insulin - Basal: consider decrease Lantus to 25 units  Correction (SSI): recommend restarting Novolog correction scale SENSITIVE scale (previously on moderate scale) Thank you  Piedad Climes BSN, RN,CDE Inpatient Diabetes Coordinator [phone] (team pager)

## 2013-02-19 ENCOUNTER — Telehealth: Payer: Self-pay | Admitting: Vascular Surgery

## 2013-02-19 ENCOUNTER — Encounter: Payer: Medicare Other | Admitting: Vascular Surgery

## 2013-02-19 LAB — CBC
HCT: 30 % — ABNORMAL LOW (ref 36.0–46.0)
Hemoglobin: 9.5 g/dL — ABNORMAL LOW (ref 12.0–15.0)
MCHC: 31.7 g/dL (ref 30.0–36.0)
MCV: 94.6 fL (ref 78.0–100.0)
RBC: 3.17 MIL/uL — ABNORMAL LOW (ref 3.87–5.11)
RDW: 16.1 % — ABNORMAL HIGH (ref 11.5–15.5)

## 2013-02-19 LAB — GLUCOSE, CAPILLARY
Glucose-Capillary: 58 mg/dL — ABNORMAL LOW (ref 70–99)
Glucose-Capillary: 66 mg/dL — ABNORMAL LOW (ref 70–99)
Glucose-Capillary: 100 mg/dL — ABNORMAL HIGH (ref 70–99)
Glucose-Capillary: 177 mg/dL — ABNORMAL HIGH (ref 70–99)
Glucose-Capillary: 211 mg/dL — ABNORMAL HIGH (ref 70–99)

## 2013-02-19 LAB — BASIC METABOLIC PANEL
BUN: 42 mg/dL — ABNORMAL HIGH (ref 6–23)
CO2: 27 mEq/L (ref 19–32)
Calcium: 9 mg/dL (ref 8.4–10.5)
GFR calc Af Amer: 27 mL/min — ABNORMAL LOW (ref >90)
GFR calc non Af Amer: 23 mL/min — ABNORMAL LOW (ref >90)
Glucose, Bld: 62 mg/dL — ABNORMAL LOW (ref 70–99)
Potassium: 5.7 mEq/L — ABNORMAL HIGH (ref 3.5–5.1)
Sodium: 138 mEq/L (ref 135–145)

## 2013-02-19 MED ORDER — GLUCOSE 40 % PO GEL
ORAL | Status: AC
  Administered 2013-02-19: 37.5 g
  Filled 2013-02-19: qty 1

## 2013-02-19 NOTE — Progress Notes (Signed)
[facility]  Subjective  - Slept ok   Objective 154/87 68 98 F (36.7 C) (Oral) 18 99%  Intake/Output Summary (Last 24 hours) at 02/19/13 0428 Last data filed at 02/18/13 1324  Gross per 24 hour  Intake    480 ml  Output    551 ml  Net    -71 ml   Right groin VAC serous drainage Right foot pink and warm, trace edema  Assessment/Planning: Poorly healing groin s/p fem pop Continue VAC Encourage nutrition VAC change tomorrow Continue Cipro Repeat CBC and BMET tomorrow  Sherren Kerns 02/19/2013 4:28 AM --  Laboratory Lab Results:  Recent Labs  02/16/13 0435 02/17/13 0500  WBC 14.0* 13.1*  HGB 10.1* 9.5*  HCT 30.6* 29.4*  PLT 149* 154   BMET  Recent Labs  02/17/13 0500 02/18/13 0450  NA 141 139  K 5.0 5.3*  CL 102 102  CO2 30 28  GLUCOSE 106* 152*  BUN 48* 45*  CREATININE 2.15* 2.14*  CALCIUM 8.8 9.1    COAG Lab Results  Component Value Date   INR 1.09 01/13/2013   INR 1.03 04/16/2012   INR 1.07 05/17/2011   No results found for this basename: PTT

## 2013-02-19 NOTE — Progress Notes (Signed)
Physical Therapy Treatment Patient Details Name: [name] MRN: [id number] DOB: 02-Jan-1946 Today's Date: 02/19/2013 Time: 0865-7846 PT Time Calculation (min): 36 min  PT Assessment / Plan / Recommendation  History of Present Illness Pt with infection to R going from previous R fem to BK pop BPG; s/p I&D Rt groin with wound VAC PMHx includes CABG, arthritis, polio   PT Comments   Pt very anxious re: ambulation and feels fatigued very quickly. Admits to fall in her home and one outside home in the past 6 months and fear of falling. Admits she did not have either her cane or RW with her when she fell. Discussed importance of maintaining strength and mobility so her risk of falling does not increase further. Max encouragement to ambulate 70 ft. Pt has 3 steps to enter home--will begin stair training next visit.   Follow Up Recommendations  Home health PT;Supervision/Assistance - 24 hour     Does the patient have the potential to tolerate intense rehabilitation     Barriers to Discharge        Equipment Recommendations  None recommended by PT (pt already has RW at home)    Recommendations for Other Services    Frequency Min 3X/week   Progress towards PT Goals Progress towards PT goals: Progressing toward goals  Plan Current plan remains appropriate    Precautions / Restrictions Precautions Precautions: Fall Precaution Comments: wound vac to R groin Restrictions Weight Bearing Restrictions: No   Pertinent Vitals/Pain SaO2 on RA 92% after ambulaiton (could not get device to register while walking--likely due to cold fingers)    Mobility  Bed Mobility Bed Mobility: Not assessed Transfers Transfers: Sit to Stand;Stand to Sit Sit to Stand: 3: Mod assist;4: Min guard;With upper extremity assist;Without upper extremity assist Stand to Sit: 4: Min guard Details for Transfer Assistance: pt able to stand with armrests and heavy use of UE assist with minguard assist; for  strengthening attempted sit to stand without UEs and pt required mod assist to continue vertical movement (due to weakness) Ambulation/Gait Ambulation/Gait Assistance: 4: Min assist Ambulation Distance (Feet): 70 Feet Assistive device: Rolling walker Ambulation/Gait Assistance Details: pushes RW too far ahead despite cues and explanation of reason to keep it closer to her body (for safety and energy conservation); assist to maneuver RW in turns; pt required assist and cues to fully pivot and back up to chair prior to beginning to sit Gait Pattern: Step-to pattern;Decreased dorsiflexion - right;Right foot flat;Left foot flat;Right flexed knee in stance;Lateral trunk lean to right;Trunk flexed Gait velocity: decreased Stairs: No (discussed attempting x1 step 10/17)    Exercises General Exercises - Lower Extremity Ankle Circles/Pumps: AROM;AAROM;Left;Right;20 reps;Limitations (reclined) Ankle Circles/Pumps Limitations: pt with limited Rt toe extension and no active DF on Rt; +plantarflexion on Rt Quad Sets: AROM;Both;10 reps (reclined) Other Exercises Other Exercises: sit to stand with and without UE assist x 3 consecutively and pt reported she was too fatigued and could do no more   PT Diagnosis:    PT Problem List:   PT Treatment Interventions:     PT Goals (current goals can now be found in the care plan section)    Visit Information  Last PT Received On: 02/19/13 Assistance Needed: +1 History of Present Illness: Pt with infection to R going from previous R fem to BK pop BPG; s/p I&D Rt groin with wound VAC PMHx includes CABG, arthritis, polio    Subjective Data  Subjective: I just don't feel right. I  don't trust myself to go very far   Cognition  Cognition Arousal/Alertness: Awake/alert Behavior During Therapy: WFL for tasks assessed/performed Overall Cognitive Status: Impaired/Different from baseline Area of Impairment: Memory Memory: Decreased short-term memory Problem  Solving: Slow processing General Comments: pt with word-finding problems throughout session; she attributes to anxiety; noted Rt  foot drop and she is unable to state how long she has had it or why (noted h/o Rt hip arthroplasty and h/o polio)    Balance     End of Session PT - End of Session Equipment Utilized During Treatment: Gait belt Activity Tolerance: Patient limited by fatigue Patient left: in chair;with call bell/phone within reach   GP     Brewster Wolters 02/19/2013, 10:53 AM Pager [phone]

## 2013-02-19 NOTE — Progress Notes (Signed)
Occupational Therapy Treatment Patient Details Name: [name] MRN: [id number] DOB: December 29, 1945 Today's Date: 02/19/2013 Time: 4098-1191 OT Time Calculation (min): 19 min  OT Assessment / Plan / Recommendation  History of present illness Pt with infection to R going from previous R fem to BK pop BPG; s/p I&D Rt groin with wound VAC PMHx includes CABG, arthritis, polio   OT comments  Pt fatigued quickly this date.  She requires min A overall for BADLs, but requires seated and standing rest breaks.  Pt reports she has 24 hour assist at home.   Follow Up Recommendations  Home health OT;Supervision/Assistance - 24 hour    Barriers to Discharge       Equipment Recommendations  None recommended by OT    Recommendations for Other Services    Frequency Min 2X/week   Progress towards OT Goals Progress towards OT goals: Progressing toward goals  Plan Discharge plan remains appropriate    Precautions / Restrictions Precautions Precautions: Fall Precaution Comments: wound vac to R groin   Pertinent Vitals/Pain     ADL  Grooming: Min guard;Brushing hair Where Assessed - Grooming: Supported standing Lower Body Dressing: Minimal assistance Where Assessed - Lower Body Dressing: Supported sit to stand Equipment Used: Agricultural consultant Transfers/Ambulation Related to ADLs: min A ADL Comments: Pt able to cross ankles over knees to access feet for LB ADLs.  Pt fatigues rapidly today, requiring one seated and one standing rest break.      OT Diagnosis:    OT Problem List:   OT Treatment Interventions:     OT Goals(current goals can now be found in the care plan section) Acute Rehab OT Goals Patient Stated Goal: home with grandson ADL Goals Pt Will Perform Grooming: with supervision;standing Pt Will Perform Upper Body Bathing: with set-up;sitting Pt Will Perform Lower Body Bathing: with min assist;sit to/from stand;with adaptive equipment Pt Will Perform Upper Body Dressing: with  mod assist;sitting Pt Will Transfer to Toilet: with supervision;ambulating;bedside commode Pt/caregiver will Perform Home Exercise Program: Increased strength;Both right and left upper extremity;With theraband;With Supervision  Visit Information  Last OT Received On: 02/19/13 Assistance Needed: +1 History of Present Illness: Pt with infection to R going from previous R fem to BK pop BPG; s/p I&D Rt groin with wound VAC PMHx includes CABG, arthritis, polio    Subjective Data      Prior Functioning       Cognition  Cognition Arousal/Alertness: Awake/alert Behavior During Therapy: WFL for tasks assessed/performed Overall Cognitive Status: Impaired/Different from baseline Area of Impairment: Memory Memory: Decreased short-term memory Safety/Judgement: Decreased awareness of safety Problem Solving: Slow processing General Comments: pt with word-finding problems throughout session; she attributes to anxiety; noted Rt  foot drop and she is unable to state how long she has had it or why (noted h/o Rt hip arthroplasty and h/o polio)    Mobility  Bed Mobility Bed Mobility: Not assessed Transfers Transfers: Sit to Stand;Stand to Sit Sit to Stand: 4: Min assist;With upper extremity assist;From chair/3-in-1 Stand to Sit: 4: Min guard;To chair/3-in-1 Details for Transfer Assistance: Min A to achieve upright standing     Exercises  General Exercises - Lower Extremity Ankle Circles/Pumps: AROM;AAROM;Left;Right;20 reps;Limitations (reclined) Ankle Circles/Pumps Limitations: pt with limited Rt toe extension and no active DF on Rt; +plantarflexion on Rt Quad Sets: AROM;Both;10 reps (reclined) Other Exercises Other Exercises: sit to stand with and without UE assist x 3 consecutively and pt reported she was too fatigued and could do no more  Balance     End of Session OT - End of Session Equipment Utilized During Treatment: Rolling walker Activity Tolerance: Patient limited by  fatigue Patient left: in chair;with call bell/phone within reach Nurse Communication: Mobility status  GO     [name], [name] 02/19/2013, 1:55 PM

## 2013-02-19 NOTE — Progress Notes (Signed)
Hypoglycemic Event  CBG: 58  Treatment: 15 GM carbohydrate snack  Symptoms: None  Follow-up CBG: Time:0615 CBG Result:66  Possible Reasons for Event: Unknown  Comments/MD notified:will notify MD    Viviana Simpler  Remember to initiate Hypoglycemia Order Set & complete

## 2013-02-19 NOTE — Telephone Encounter (Addendum)
Message copied by Fredrich Birks on Thu Feb 19, 2013  2:16 PM ------      Message from: Marlowe Shores      Created: Wed Feb 18, 2013  3:18 PM       2 week F/U open wound with vac - Fields ------  02/19/13: voice mailbox is full, mailed letter, dpm

## 2013-02-19 NOTE — Progress Notes (Signed)
Hypoglycemic Event  CBG: 66  Treatment: 15 GM gel  Symptoms: None  Follow-up CBG: GNFA:2130 CBG Result:100  Possible Reasons for Event: Unknown  Comments/MD notified:will notify MD    Viviana Simpler  Remember to initiate Hypoglycemia Order Set & complete

## 2013-02-19 NOTE — Progress Notes (Signed)
CBGs on 10/15   128-115-165-194-143 mg/dl       16/10   96/045-409 mg/dl AM CBG was 66 mg/dl.  Recommend decreasing Lantus to 25 units daily.  Start Novolog SENSITIVE correction scale AC & HS while in the hospital.  Smith Mince RN BSN CDE

## 2013-02-20 LAB — CBC
HCT: 28.8 % — ABNORMAL LOW (ref 36.0–46.0)
Hemoglobin: 9.3 g/dL — ABNORMAL LOW (ref 12.0–15.0)
MCH: 31.1 pg (ref 26.0–34.0)
MCV: 96.3 fL (ref 78.0–100.0)
Platelets: 168 10*3/uL (ref 150–400)
RDW: 16.5 % — ABNORMAL HIGH (ref 11.5–15.5)
WBC: 9.4 10*3/uL (ref 4.0–10.5)

## 2013-02-20 LAB — GLUCOSE, CAPILLARY
Glucose-Capillary: 122 mg/dL — ABNORMAL HIGH (ref 70–99)
Glucose-Capillary: 143 mg/dL — ABNORMAL HIGH (ref 70–99)

## 2013-02-20 LAB — BASIC METABOLIC PANEL
BUN: 46 mg/dL — ABNORMAL HIGH (ref 6–23)
BUN: 45 mg/dL — ABNORMAL HIGH (ref 6–23)
CO2: 26 mEq/L (ref 19–32)
Calcium: 8.5 mg/dL (ref 8.4–10.5)
Calcium: 8.9 mg/dL (ref 8.4–10.5)
Chloride: 102 mEq/L (ref 96–112)
Chloride: 103 mEq/L (ref 96–112)
Creatinine, Ser: 2.27 mg/dL — ABNORMAL HIGH (ref 0.50–1.10)
Creatinine, Ser: 2.24 mg/dL — ABNORMAL HIGH (ref 0.50–1.10)
GFR calc Af Amer: 25 mL/min — ABNORMAL LOW (ref >90)
Potassium: 6 mEq/L — ABNORMAL HIGH (ref 3.5–5.1)

## 2013-02-20 MED ORDER — TORSEMIDE 20 MG PO TABS
20.0000 mg | ORAL_TABLET | Freq: Every day | ORAL | Status: DC
  Administered 2013-02-20 – 2013-02-24 (×5): 20 mg via ORAL
  Filled 2013-02-20 (×6): qty 1

## 2013-02-20 MED ORDER — SODIUM POLYSTYRENE SULFONATE 15 GM/60ML PO SUSP
60.0000 g | Freq: Once | ORAL | Status: AC
  Administered 2013-02-20: 60 g via ORAL
  Filled 2013-02-20: qty 240

## 2013-02-20 MED ORDER — NEPRO/CARBSTEADY PO LIQD
237.0000 mL | Freq: Two times a day (BID) | ORAL | Status: DC
  Administered 2013-02-21 – 2013-02-27 (×6): 237 mL via ORAL
  Filled 2013-02-20 (×17): qty 237

## 2013-02-20 NOTE — Progress Notes (Signed)
Physical Therapy Treatment Patient Details Name: [name] MRN: [id number] DOB: 1945-10-02 Today's Date: 02/20/2013 Time: 5284-1324 PT Time Calculation (min): 23 min  PT Assessment / Plan / Recommendation  History of Present Illness Pt with infection to R going from previous R fem to BK pop BPG; s/p I&D Rt groin with wound VAC PMHx includes CABG, arthritis, polio   PT Comments   Pt very sleepy today.  Able to go up/down step.  Follow Up Recommendations  Home health PT;Supervision/Assistance - 24 hour     Does the patient have the potential to tolerate intense rehabilitation     Barriers to Discharge        Equipment Recommendations  None recommended by PT    Recommendations for Other Services    Frequency Min 3X/week   Progress towards PT Goals Progress towards PT goals: Progressing toward goals  Plan Current plan remains appropriate    Precautions / Restrictions Precautions Precautions: Fall Precaution Comments: wound vac to R groin   Pertinent Vitals/Pain VSS    Mobility  Bed Mobility Supine to Sit: 4: Min assist;HOB elevated Sit to Supine: 4: Min assist;HOB flat Details for Bed Mobility Assistance: Assist to bring trunk up and to bring rt leg back up into bed. Transfers Sit to Stand: 4: Min assist;With upper extremity assist;From bed Stand to Sit: 4: Min guard;With upper extremity assist;To bed Details for Transfer Assistance: Min A to achieve upright standing  Ambulation/Gait Ambulation/Gait Assistance: 4: Min assist Ambulation Distance (Feet): 70 Feet Assistive device: Rolling walker Ambulation/Gait Assistance Details: Verbal cues to stand more erect and look up.  Pt had to stop and prop forearms on walker 3 times to rest. Gait Pattern: Step-to pattern;Decreased dorsiflexion - right;Right foot flat;Left foot flat;Right flexed knee in stance;Lateral trunk lean to right;Trunk flexed Gait velocity: decreased Stairs Assistance: 4: Min assist Stairs  Assistance Details (indicate cue type and reason): Hand held assist on rt and pt using sink counter on lt to simulate rail. Stair Management Technique: Step to pattern;Forwards Number of Stairs: 1    Exercises     PT Diagnosis:    PT Problem List:   PT Treatment Interventions:     PT Goals (current goals can now be found in the care plan section)    Visit Information  Last PT Received On: 02/20/13 Assistance Needed: +1 History of Present Illness: Pt with infection to R going from previous R fem to BK pop BPG; s/p I&D Rt groin with wound VAC PMHx includes CABG, arthritis, polio    Subjective Data      Cognition  Cognition Arousal/Alertness: Lethargic Behavior During Therapy: WFL for tasks assessed/performed Overall Cognitive Status: Impaired/Different from baseline Memory: Decreased short-term memory Problem Solving: Slow processing General Comments: Pt very sleepy.  Falling back to sleep very quickly.  Was more alert at end of session.    Balance  Static Standing Balance Static Standing - Balance Support: Bilateral upper extremity supported Static Standing - Level of Assistance: 4: Min assist  End of Session PT - End of Session Equipment Utilized During Treatment: Gait belt Activity Tolerance: Patient limited by fatigue Patient left: in bed;with call bell/phone within reach Nurse Communication: Mobility status   GP     Dustin Burrill 02/20/2013, 12:03 PM  [employer] PT [phone]

## 2013-02-20 NOTE — Progress Notes (Addendum)
Subjective:   Asked to come back to see patient because of the development of hyperkalemia I last saw patient on 10/13 and at time of sign off  - creatinine  was 2.12, K 5.0 In interim time was started on losartan (not sure why) which was discontinued yesterday Has been on Ensure supplements and non potassium restricted diet Potassium yesterday 5.7, today up to 6.2 Kayexelate has been ordered  Pt upset that she has to go back to the OR on Monday (non-healing groin despite wound VAC)  Has developed some edema over the course of the week  Objective  Weight change:   Intake/Output Summary (Last 24 hours) at 02/20/13 1421 Last data filed at 02/20/13 1240  Gross per 24 hour  Intake    840 ml  Output    450 ml  Net    390 ml   Physical Exam:  Blood pressure 151/74, pulse 81, temperature 98.2 F (36.8 C), temperature source Oral, resp. rate 21, height 5' (1.524 m), weight 77.3 kg (170 lb 6.7 oz), SpO2 94.00%. NAD Awake, alert and appropriate CV:RRR, nl s1s2 Healed median sternotomy scar LUNGS: relatively clear  ABD: soft, nontender  EXT: Wound VAC in right groin  1-2+edema bilat lower extremities    Recent Labs Lab 02/14/13 0430 02/15/13 0340 02/16/13 0435 02/17/13 0500 02/18/13 0450 02/19/13 0523 02/20/13 0820  NA 141 138 139 141 139 138 136  K 3.8 3.9 5.0 5.0 5.3* 5.7* 6.2*  CL 98 97 99 102 102 104 102  CO2 33* 32 32 30 28 27 26   GLUCOSE 301* 221* 139* 106* 152* 62* 116*  BUN 69* 57* 50* 48* 45* 42* 46*  CREATININE 2.19* 2.09* 2.12* 2.15* 2.14* 2.12* 2.27*  CALCIUM 7.6* 7.7* 8.6 8.8 9.1 9.0 8.5   No results found for this basename: AST, ALT, ALKPHOS, BILITOT, PROT, ALBUMIN,  in the last 168 hours  Recent Labs Lab 02/16/13 0435 02/17/13 0500 02/19/13 0523 02/20/13 0820  WBC 14.0* 13.1* 10.0 9.4  HGB 10.1* 9.5* 9.5* 9.3*  HCT 30.6* 29.4* 30.0* 28.8*  MCV 92.7 94.2 94.6 96.3  PLT 149* 154 153 168     Recent Labs Lab 02/19/13 1121 02/19/13 1605  02/19/13 2106 02/20/13 0609 02/20/13 1112  GLUCAP 177* 211* 125* 120* 122*    Medications:   . ipratropium  0.5 mg Nebulization BID   And  . albuterol  2.5 mg Nebulization BID  . aspirin  81 mg Oral Daily  . atorvastatin  10 mg Oral q1800  . budesonide-formoterol  2 puff Inhalation BID  . carvedilol  12.5 mg Oral BID WC  . ciprofloxacin  750 mg Oral Q breakfast  . clotrimazole  1 Applicatorful Vaginal QHS  . docusate sodium  100 mg Oral Daily  . DULoxetine  60 mg Oral Daily  . enoxaparin (LOVENOX) injection  30 mg Subcutaneous Q24H  . feeding supplement (ENSURE COMPLETE)  237 mL Oral TID BM  . feeding supplement (PRO-STAT SUGAR FREE 64)  30 mL Oral TID  . insulin glargine  28 Units Subcutaneous Daily  . multivitamin with minerals  1 tablet Oral Daily  . pantoprazole  40 mg Oral Daily  . pregabalin  50 mg Oral BID  . sodium polystyrene  60 g Oral Once     I  have reviewed scheduled and prn medications.  ASSESSMENT/RECOMMENDATIONS  74F w/ PVD s/p Fem-Pop bypass 9/16 complicated  by wound infection with Serratia s/p OR debridement 10/3,10/8,  evacuation of hematoma 10/9;  Known CKD with BL SCr 1.5 in September 2014 with AKI -  likely ATN. Peak creatinine 4.73 on 10/5 - recovered to around 2.12 and we signed off 10/13 with plan for f/u appt with Dr. Sabra Heck at [facility] on 03/12/13 at 1:15 PM .   Interval development of hyperkalemia, progressive since 10/14 likely related to addition of losartan (unsure why started but has been discontinued), Ensure supplements and non-K restricted diet.    Assessment/Plan  1. AKI on CKD: d/t ATN. Peaked at 4.7  Nadir around 2.12   2. Hyperkalemia - exposure to ARB 10/14-10/16, plus high K suppls plus non K restricted diet.  Agree with kayexelate, f/u lab this PM.  Off losartan. K restrict diet 3. Volume - some edema now.  Start torsemide 20/day. This will also help with K  4. S/p F-P bypass and inguinal wound infection with multiple debridements  and w/ Serratia; per [facility]. For another op on Monday  Will follow  Camille Bal, MD [facility] [phone] pager 02/20/2013, 2:21 PM

## 2013-02-20 NOTE — Progress Notes (Signed)
[facility]  Subjective  - Feels ok   Objective 116/69 64 98.2 F (36.8 C) (Oral) 18 92%  Intake/Output Summary (Last 24 hours) at 02/20/13 1315 Last data filed at 02/20/13 1240  Gross per 24 hour  Intake    840 ml  Output    450 ml  Net    390 ml   Right groin wound still with  Fibrinous exudate on abdominal wall and lateral soft tissues 75% granulating  Assessment/Planning: 1.  Renal dysfunction-  Creatinine trending up again hyperkalemia, will have renal service re-eval, will give some kaexylate now 2.  Non healing groin- debridement in OR Monday, continue VAC  FIELDS,[name] 02/20/2013 1:15 PM --  Laboratory Lab Results:  Recent Labs  02/19/13 0523 02/20/13 0820  WBC 10.0 9.4  HGB 9.5* 9.3*  HCT 30.0* 28.8*  PLT 153 168   BMET  Recent Labs  02/19/13 0523 02/20/13 0820  NA 138 136  K 5.7* 6.2*  CL 104 102  CO2 27 26  GLUCOSE 62* 116*  BUN 42* 46*  CREATININE 2.12* 2.27*  CALCIUM 9.0 8.5    COAG Lab Results  Component Value Date   INR 1.09 01/13/2013   INR 1.03 04/16/2012   INR 1.07 05/17/2011   No results found for this basename: PTT

## 2013-02-21 LAB — CBC
HCT: 30.5 % — ABNORMAL LOW (ref 36.0–46.0)
Hemoglobin: 9.6 g/dL — ABNORMAL LOW (ref 12.0–15.0)
MCH: 30.4 pg (ref 26.0–34.0)
MCHC: 31.5 g/dL (ref 30.0–36.0)
RDW: 16.4 % — ABNORMAL HIGH (ref 11.5–15.5)

## 2013-02-21 LAB — SURGICAL PCR SCREEN: MRSA, PCR: NEGATIVE

## 2013-02-21 LAB — PREALBUMIN: Prealbumin: 23.1 mg/dL (ref 17.0–34.0)

## 2013-02-21 LAB — RENAL FUNCTION PANEL
CO2: 26 mEq/L (ref 19–32)
Calcium: 8.4 mg/dL (ref 8.4–10.5)
Creatinine, Ser: 2.31 mg/dL — ABNORMAL HIGH (ref 0.50–1.10)
Glucose, Bld: 178 mg/dL — ABNORMAL HIGH (ref 70–99)
Phosphorus: 6 mg/dL — ABNORMAL HIGH (ref 2.3–4.6)
Potassium: 5.5 mEq/L — ABNORMAL HIGH (ref 3.5–5.1)

## 2013-02-21 LAB — GLUCOSE, CAPILLARY: Glucose-Capillary: 186 mg/dL — ABNORMAL HIGH (ref 70–99)

## 2013-02-21 MED ORDER — DIPHENHYDRAMINE HCL 25 MG PO CAPS
25.0000 mg | ORAL_CAPSULE | Freq: Four times a day (QID) | ORAL | Status: DC | PRN
  Administered 2013-02-21: 25 mg via ORAL
  Filled 2013-02-21: qty 1

## 2013-02-21 NOTE — Progress Notes (Signed)
Subjective:  In much better spirits today Eating breakfast Plans to walk after breakfast  Objective  Weight change:   Intake/Output Summary (Last 24 hours) at 02/21/13 0939 Last data filed at 02/21/13 0415  Gross per 24 hour  Intake    120 ml  Output   1200 ml  Net  -1080 ml   Physical Exam:  Blood pressure 151/74, pulse 81, temperature 98.2 F (36.8 C), temperature source Oral, resp. rate 21, height 5' (1.524 m), weight 77.3 kg (170 lb 6.7 oz), SpO2 94.00%. NAD Awake, alert and appropriate CV:RRR, nl s1s2 Healed median sternotomy scar LUNGS: scattered rhonchi posteriorly ABD: soft, nontender  EXT: Wound VAC in right groin  1+edema bilat lower extremities   Recent Labs Lab 02/16/13 0435 02/17/13 0500 02/18/13 0450 02/19/13 0523 02/20/13 0820 02/20/13 1747 02/21/13 0545  NA 139 141 139 138 136 138 141  K 5.0 5.0 5.3* 5.7* 6.2* 6.0* 5.5*  CL 99 102 102 104 102 103 105  CO2 32 30 28 27 26 25 26   GLUCOSE 139* 106* 152* 62* 116* 171* 178*  BUN 50* 48* 45* 42* 46* 45* 45*  CREATININE 2.12* 2.15* 2.14* 2.12* 2.27* 2.24* 2.31*  CALCIUM 8.6 8.8 9.1 9.0 8.5 8.9 8.4  PHOS  --   --   --   --   --   --  6.0*    Recent Labs Lab 02/21/13 0545  ALBUMIN 2.2*    Recent Labs Lab 02/17/13 0500 02/19/13 0523 02/20/13 0820 02/21/13 0545  WBC 13.1* 10.0 9.4 8.5  HGB 9.5* 9.5* 9.3* 9.6*  HCT 29.4* 30.0* 28.8* 30.5*  MCV 94.2 94.6 96.3 96.5  PLT 154 153 168 179     Recent Labs Lab 02/20/13 0609 02/20/13 1112 02/20/13 1614 02/20/13 2017 02/21/13 0624  GLUCAP 120* 122* 143* 157* 186*    Medications:   . aspirin  81 mg Oral Daily  . atorvastatin  10 mg Oral q1800  . budesonide-formoterol  2 puff Inhalation BID  . carvedilol  12.5 mg Oral BID WC  . ciprofloxacin  750 mg Oral Q breakfast  . clotrimazole  1 Applicatorful Vaginal QHS  . docusate sodium  100 mg Oral Daily  . DULoxetine  60 mg Oral Daily  . enoxaparin (LOVENOX) injection  30 mg Subcutaneous Q24H  .  feeding supplement (NEPRO CARB STEADY)  237 mL Oral BID BM  . feeding supplement (PRO-STAT SUGAR FREE 64)  30 mL Oral TID  . insulin glargine  28 Units Subcutaneous Daily  . multivitamin with minerals  1 tablet Oral Daily  . pantoprazole  40 mg Oral Daily  . pregabalin  50 mg Oral BID  . torsemide  20 mg Oral Daily     I  have reviewed scheduled and prn medications.  ASSESSMENT/RECOMMENDATIONS  45F w/ PVD s/p Fem-Pop bypass 9/16 complicated  by wound infection with Serratia s/p OR debridement 10/3,10/8,  evacuation of hematoma 10/9;  Known CKD with BL SCr 1.5 in September 2014 with AKI -  likely ATN. Peak creatinine 4.73 on 10/5 - recovered to around 2.12 and we signed off 10/13 with plan for f/u appt with Dr. Sabra Heck at [facility] on 03/12/13 at 1:15 PM .   Asked to resume following due to Interval development of hyperkalemia, progressive since 10/14 likely related to addition of losartan (unsure why started but has been discontinued), Ensure supplements and non-K restricted diet.    Assessment/Plan  1. AKI on CKD: d/t ATN. Peaked at 4.7  Nadir around 2.12 Has crept up a little past few days.  Not exactly sure why. Did have transient re-exposure to ARB earlier in the week though no hypotension. Watch 2. Hyperkalemia - exposure to ARB 10/14-10/16, plus high K suppls plus non K restricted diet.  S/p kayexelate. Potassium down to 5.5 today.   3. Volume - some edema now.  Started torsemide 20/day on 10/17. This should  also help with K  4. S/p F-P bypass and inguinal wound infection with multiple debridements and w/ Serratia; on Cipro.  per [facility]. For another op on Monday  Will follow  Heidi Bal, MD [facility] [phone] pager 02/21/2013, 9:39 AM

## 2013-02-21 NOTE — Progress Notes (Signed)
Subjective: Interval History: none.. Patient is comfortable. She has been up walking.   Objective: Vital signs in last 24 hours: Temp:  [98.2 F (36.8 C)-98.6 F (37 C)] 98.4 F (36.9 C) (10/18 0412) Pulse Rate:  [61-80] 72 (10/18 0412) Resp:  [18] 18 (10/18 0412) BP: (135-151)/(47-64) 151/56 mmHg (10/18 0412) SpO2:  [93 %-100 %] 93 % (10/18 0412)  Intake/Output from previous day: 10/17 0701 - 10/18 0700 In: 360 [P.O.:360] Out: 1200 [Urine:1200] Intake/Output this shift:    Groin VAC in place.  Lab Results:  Recent Labs  02/20/13 0820 02/21/13 0545  WBC 9.4 8.5  HGB 9.3* 9.6*  HCT 28.8* 30.5*  PLT 168 179   BMET  Recent Labs  02/20/13 1747 02/21/13 0545  NA 138 141  K 6.0* 5.5*  CL 103 105  CO2 25 26  GLUCOSE 171* 178*  BUN 45* 45*  CREATININE 2.24* 2.31*  CALCIUM 8.9 8.4    Studies/Results: US Renal  02/09/2013   CLINICAL DATA:  Acute renal failure  EXAM: RENAL/URINARY TRACT ULTRASOUND COMPLETE  COMPARISON:  None  FINDINGS: Right Kidney  Length: 10.0 cm. Mild increased cortical echogenicity. No mass or hydronephrosis noted.  Left Kidney  Length: Measures 12.4 cm. Mild increase in cortical echogenicity. No mass or hydronephrosis.  Bladder:  Appears normal for degree of bladder distention.  IMPRESSION: 1. Bilateral echogenic kidneys suggestive of chronic medical renal disease.  2. No mass or hydronephrosis.   Electronically Signed   By: Signa Kell M.D.   On: 02/09/2013 08:07   Dg Chest Port 1 View  02/11/2013   CLINICAL DATA:  Hypoxia, cough and congestion.  EXAM: PORTABLE CHEST - 1 VIEW  COMPARISON:  01/22/2013.  FINDINGS: Feeding tube is followed into the stomach with the tip projecting beyond the inferior margin of the image. Heart is enlarged. Mild diffuse bilateral airspace disease with consolidative changes at the left lung base. Probable left pleural effusion.  IMPRESSION: Favor congestive heart failure. Difficult to exclude left lower lobe pneumonia.    Electronically Signed   By: Leanna Battles M.D.   On: 02/11/2013 10:40   Dg Chest Port 1v Same Day  01/22/2013   *RADIOLOGY REPORT*  Clinical Data: Decreased oxygenation.  History of coronary artery disease, diabetes, hypertension, asthma.  PORTABLE CHEST - 1 VIEW SAME DAY  Comparison: Chest radiograph 01/13/2013  Findings: Stable enlarged cardiac and mediastinal contours status post median sternotomy and CABG procedure.  Calcification of the transverse thoracic aorta.  Interval development of linear consolidative opacity within the right lower lung.  No definite pleural effusion pneumothorax.  Regional skeleton is unremarkable.  IMPRESSION: Interval development of band-like consolidative opacity within the right lower lung which may represent pneumonia, atelectasis or sequelae of aspiration.  Attention on follow-up.   Original Report Authenticated By: Annia Belt, M.D   Dg Abd Portable 1v  02/12/2013   CLINICAL DATA:  Feeding tube placement  EXAM: PORTABLE ABDOMEN - 1 VIEW  COMPARISON:  Prior chest x-ray 02/10/2013  FINDINGS: The tip of the feeding tube is in the gastric antrum. The bowel gas pattern is nonobstructive. Stable left basilar opacity. 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 Degenerative disc disease and lower lumbar facet arthropathy.  IMPRESSION: The tip of the enteric feeding tube is in the gastric antrum.   Electronically Signed   By: Malachy Moan M.D.   On: 02/12/2013 20:18   Dg Abd Portable 1v  02/10/2013   *RADIOLOGY REPORT*  Clinical Data: Panda tube placement;  check tip position.  PORTABLE ABDOMEN - 1 VIEW  Comparison: Abdominal radiograph performed 02/09/2013  Findings: The patient's Panda tube is noted ending at the antrum of the stomach.  The visualized bowel gas pattern is grossly unremarkable.  No free intra-abdominal air is identified, though evaluation for free air is suboptimal on a single supine view.  The patient's right hip prosthesis is incompletely  imaged, but appears grossly unremarkable.  No acute osseous abnormalities are identified.  A complex left-sided pleural effusion is again noted.  IMPRESSION: Panda tube noted ending at the antrum of the stomach.   Original Report Authenticated By: Tonia Ghent, M.D.   Dg Abd Portable 1v  02/09/2013   CLINICAL DATA:  Feeding tube replacement.  EXAM: PORTABLE ABDOMEN - 1 VIEW  COMPARISON:  One-view abdomen 02/08/2013.  FINDINGS: The tip of the small bore feeding tube is within the antrum of the stomach. A left pleural effusion and lower lobe airspace disease remains. The heart is enlarged.  IMPRESSION: The tip of a small bore feeding tube is within the gastric antrum.   Electronically Signed   By: Gennette Pac M.D.   On: 02/09/2013 12:18   Dg Abd Portable 1v  02/08/2013   CLINICAL DATA:  Feeding tube placement.  EXAM: PORTABLE ABDOMEN - 1 VIEW  COMPARISON:  02/08/2013, 1814 hr.  FINDINGS: Advancement of weighted tip feeding tube, now with in the distal fundus and antrum of the stomach. Lumbar spondylosis and scoliosis appears similar.  IMPRESSION: Advancement of weighted tip feeding tube with the tip in the distal fundus near the antrum.   Electronically Signed   By: Andreas Newport M.D.   On: 02/08/2013 20:42   Dg Abd Portable 1v  02/08/2013   CLINICAL DATA:  Feeding tube placement  EXAM: PORTABLE ABDOMEN - 1 VIEW  COMPARISON:  Film from 1718 hrs  FINDINGS: Scattered large and small bowel gas is noted. A Dobbhoff catheter is now seen just beyond the gastroesophageal junction advanced from the prior exam. Persistent left basilar changes are seen. Degenerative change of the lumbar spine is noted.  IMPRESSION: Dobbhoff catheter now extends into the stomach but could be advanced further.   Electronically Signed   By: Alcide Clever M.D.   On: 02/08/2013 18:20   Dg Abd Portable 1v  02/08/2013   CLINICAL DATA:  Confirmation of Panda placement.  EXAM: PORTABLE ABDOMEN - 1 VIEW  COMPARISON:  None.  FINDINGS:  Panda tube tip is lodged in the distal esophagus and needs to be advanced. Gas pattern is nonspecific.  IMPRESSION: Panda tube needs advancement into the stomach   Electronically Signed   By: Davonna Belling M.D.   On: 02/08/2013 17:27   Anti-infectives: Anti-infectives   Start     Dose/Rate Route Frequency Ordered Stop   02/18/13 0700  ciprofloxacin (CIPRO) tablet 750 mg     750 mg Oral Daily with breakfast 02/17/13 0842     02/18/13 0000  ciprofloxacin (CIPRO) 750 MG tablet     750 mg Oral Daily with breakfast 02/18/13 1516     02/13/13 1200  piperacillin-tazobactam (ZOSYN) IVPB 3.375 g  Status:  Discontinued     3.375 g 12.5 mL/hr over 240 Minutes Intravenous Every 8 hours 02/13/13 1037 02/17/13 0842   02/11/13 0600  vancomycin (VANCOCIN) IVPB 1000 mg/200 mL premix  Status:  Discontinued     1,000 mg 200 mL/hr over 60 Minutes Intravenous Every 48 hours 02/10/13 1048 02/12/13 0818   02/08/13 1330  vancomycin (  VANCOCIN) IVPB 1000 mg/200 mL premix     1,000 mg 200 mL/hr over 60 Minutes Intravenous  Once 02/08/13 1309 02/08/13 1600   02/07/13 1200  piperacillin-tazobactam (ZOSYN) IVPB 2.25 g  Status:  Discontinued     2.25 g 100 mL/hr over 30 Minutes Intravenous Every 8 hours 02/07/13 0304 02/13/13 1037   02/07/13 1000  vancomycin (VANCOCIN) IVPB 1000 mg/200 mL premix  Status:  Discontinued     1,000 mg 200 mL/hr over 60 Minutes Intravenous Every 24 hours 02/06/13 1801 02/07/13 0303   02/06/13 1830  piperacillin-tazobactam (ZOSYN) IVPB 3.375 g  Status:  Discontinued     3.375 g 12.5 mL/hr over 240 Minutes Intravenous Every 8 hours 02/06/13 1759 02/07/13 0304   02/05/13 1422  vancomycin (VANCOCIN) IVPB 1000 mg/200 mL premix     1,000 mg 200 mL/hr over 60 Minutes Intravenous 60 min pre-op 02/05/13 1422 02/06/13 1050      Assessment/Plan: s/p Procedure(s): EVACUATION HEMATOMA (Right) Stable overall. Understands need for operative debridement of right groin on Monday. Potassium is down  today to 5.5. Appreciate nephrology assistance.   LOS: 15 days   Annarae Macnair 02/21/2013, 9:27 AM

## 2013-02-22 LAB — GLUCOSE, CAPILLARY
Glucose-Capillary: 144 mg/dL — ABNORMAL HIGH (ref 70–99)
Glucose-Capillary: 180 mg/dL — ABNORMAL HIGH (ref 70–99)

## 2013-02-22 LAB — CBC
HCT: 29.9 % — ABNORMAL LOW (ref 36.0–46.0)
MCH: 30.6 pg (ref 26.0–34.0)
MCHC: 31.8 g/dL (ref 30.0–36.0)
MCV: 96.5 fL (ref 78.0–100.0)
Platelets: 185 10*3/uL (ref 150–400)
RBC: 3.1 MIL/uL — ABNORMAL LOW (ref 3.87–5.11)
RDW: 16.7 % — ABNORMAL HIGH (ref 11.5–15.5)

## 2013-02-22 LAB — RENAL FUNCTION PANEL
Albumin: 2.2 g/dL — ABNORMAL LOW (ref 3.5–5.2)
BUN: 46 mg/dL — ABNORMAL HIGH (ref 6–23)
CO2: 25 mEq/L (ref 19–32)
Calcium: 8.4 mg/dL (ref 8.4–10.5)
Creatinine, Ser: 2.14 mg/dL — ABNORMAL HIGH (ref 0.50–1.10)
GFR calc non Af Amer: 23 mL/min — ABNORMAL LOW (ref >90)
Phosphorus: 5.2 mg/dL — ABNORMAL HIGH (ref 2.3–4.6)

## 2013-02-22 NOTE — Progress Notes (Signed)
Subjective: Interval History: none.. comfortable  Objective: Vital signs in last 24 hours: Temp:  [98.2 F (36.8 C)-98.5 F (36.9 C)] 98.5 F (36.9 C) (10/19 0450) Pulse Rate:  [64-82] 82 (10/19 0450) Resp:  [18] 18 (10/19 0450) BP: (127-159)/(72-82) 127/75 mmHg (10/19 0450) SpO2:  [90 %-100 %] 90 % (10/19 0450)  Intake/Output from previous day: 10/18 0701 - 10/19 0700 In: 360 [P.O.:360] Out: 1560 [Urine:1500; Drains:60] Intake/Output this shift: Total I/O In: 120 [P.O.:120] Out: -   No change in the right groin. No surrounding erythema. VAC in place.  Lab Results:  Recent Labs  02/21/13 0545 02/22/13 0520  WBC 8.5 8.2  HGB 9.6* 9.5*  HCT 30.5* 29.9*  PLT 179 185   BMET  Recent Labs  02/21/13 0545 02/22/13 0520  NA 141 141  K 5.5* 4.7  CL 105 105  CO2 26 25  GLUCOSE 178* 137*  BUN 45* 46*  CREATININE 2.31* 2.14*  CALCIUM 8.4 8.4    Studies/Results: US Renal  02/09/2013   CLINICAL DATA:  Acute renal failure  EXAM: RENAL/URINARY TRACT ULTRASOUND COMPLETE  COMPARISON:  None  FINDINGS: Right Kidney  Length: 10.0 cm. Mild increased cortical echogenicity. No mass or hydronephrosis noted.  Left Kidney  Length: Measures 12.4 cm. Mild increase in cortical echogenicity. No mass or hydronephrosis.  Bladder:  Appears normal for degree of bladder distention.  IMPRESSION: 1. Bilateral echogenic kidneys suggestive of chronic medical renal disease.  2. No mass or hydronephrosis.   Electronically Signed   By: Signa Kell M.D.   On: 02/09/2013 08:07   Dg Chest Port 1 View  02/11/2013   CLINICAL DATA:  Hypoxia, cough and congestion.  EXAM: PORTABLE CHEST - 1 VIEW  COMPARISON:  01/22/2013.  FINDINGS: Feeding tube is followed into the stomach with the tip projecting beyond the inferior margin of the image. Heart is enlarged. Mild diffuse bilateral airspace disease with consolidative changes at the left lung base. Probable left pleural effusion.  IMPRESSION: Favor congestive  heart failure. Difficult to exclude left lower lobe pneumonia.   Electronically Signed   By: Leanna Battles M.D.   On: 02/11/2013 10:40   Dg Abd Portable 1v  02/12/2013   CLINICAL DATA:  Feeding tube placement  EXAM: PORTABLE ABDOMEN - 1 VIEW  COMPARISON:  Prior chest x-ray 02/10/2013  FINDINGS: The tip of the feeding tube is in the gastric antrum. The bowel gas pattern is nonobstructive. Stable left basilar opacity. 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 Degenerative disc disease and lower lumbar facet arthropathy.  IMPRESSION: The tip of the enteric feeding tube is in the gastric antrum.   Electronically Signed   By: Malachy Moan M.D.   On: 02/12/2013 20:18   Dg Abd Portable 1v  02/10/2013   *RADIOLOGY REPORT*  Clinical Data: Panda tube placement; check tip position.  PORTABLE ABDOMEN - 1 VIEW  Comparison: Abdominal radiograph performed 02/09/2013  Findings: The patient's Panda tube is noted ending at the antrum of the stomach.  The visualized bowel gas pattern is grossly unremarkable.  No free intra-abdominal air is identified, though evaluation for free air is suboptimal on a single supine view.  The patient's right hip prosthesis is incompletely imaged, but appears grossly unremarkable.  No acute osseous abnormalities are identified.  A complex left-sided pleural effusion is again noted.  IMPRESSION: Panda tube noted ending at the antrum of the stomach.   Original Report Authenticated By: Tonia Ghent, M.D.   Dg Abd Portable 1v  02/09/2013   CLINICAL DATA:  Feeding tube replacement.  EXAM: PORTABLE ABDOMEN - 1 VIEW  COMPARISON:  One-view abdomen 02/08/2013.  FINDINGS: The tip of the small bore feeding tube is within the antrum of the stomach. A left pleural effusion and lower lobe airspace disease remains. The heart is enlarged.  IMPRESSION: The tip of a small bore feeding tube is within the gastric antrum.   Electronically Signed   By: Gennette Pac M.D.   On: 02/09/2013 12:18    Dg Abd Portable 1v  02/08/2013   CLINICAL DATA:  Feeding tube placement.  EXAM: PORTABLE ABDOMEN - 1 VIEW  COMPARISON:  02/08/2013, 1814 hr.  FINDINGS: Advancement of weighted tip feeding tube, now with in the distal fundus and antrum of the stomach. Lumbar spondylosis and scoliosis appears similar.  IMPRESSION: Advancement of weighted tip feeding tube with the tip in the distal fundus near the antrum.   Electronically Signed   By: Andreas Newport M.D.   On: 02/08/2013 20:42   Dg Abd Portable 1v  02/08/2013   CLINICAL DATA:  Feeding tube placement  EXAM: PORTABLE ABDOMEN - 1 VIEW  COMPARISON:  Film from 1718 hrs  FINDINGS: Scattered large and small bowel gas is noted. A Dobbhoff catheter is now seen just beyond the gastroesophageal junction advanced from the prior exam. Persistent left basilar changes are seen. Degenerative change of the lumbar spine is noted.  IMPRESSION: Dobbhoff catheter now extends into the stomach but could be advanced further.   Electronically Signed   By: Alcide Clever M.D.   On: 02/08/2013 18:20   Dg Abd Portable 1v  02/08/2013   CLINICAL DATA:  Confirmation of Panda placement.  EXAM: PORTABLE ABDOMEN - 1 VIEW  COMPARISON:  None.  FINDINGS: Panda tube tip is lodged in the distal esophagus and needs to be advanced. Gas pattern is nonspecific.  IMPRESSION: Panda tube needs advancement into the stomach   Electronically Signed   By: Davonna Belling M.D.   On: 02/08/2013 17:27   Anti-infectives: Anti-infectives   Start     Dose/Rate Route Frequency Ordered Stop   02/18/13 0700  ciprofloxacin (CIPRO) tablet 750 mg     750 mg Oral Daily with breakfast 02/17/13 0842     02/18/13 0000  ciprofloxacin (CIPRO) 750 MG tablet     750 mg Oral Daily with breakfast 02/18/13 1516     02/13/13 1200  piperacillin-tazobactam (ZOSYN) IVPB 3.375 g  Status:  Discontinued     3.375 g 12.5 mL/hr over 240 Minutes Intravenous Every 8 hours 02/13/13 1037 02/17/13 0842   02/11/13 0600  vancomycin  (VANCOCIN) IVPB 1000 mg/200 mL premix  Status:  Discontinued     1,000 mg 200 mL/hr over 60 Minutes Intravenous Every 48 hours 02/10/13 1048 02/12/13 0818   02/08/13 1330  vancomycin (VANCOCIN) IVPB 1000 mg/200 mL premix     1,000 mg 200 mL/hr over 60 Minutes Intravenous  Once 02/08/13 1309 02/08/13 1600   02/07/13 1200  piperacillin-tazobactam (ZOSYN) IVPB 2.25 g  Status:  Discontinued     2.25 g 100 mL/hr over 30 Minutes Intravenous Every 8 hours 02/07/13 0304 02/13/13 1037   02/07/13 1000  vancomycin (VANCOCIN) IVPB 1000 mg/200 mL premix  Status:  Discontinued     1,000 mg 200 mL/hr over 60 Minutes Intravenous Every 24 hours 02/06/13 1801 02/07/13 0303   02/06/13 1830  piperacillin-tazobactam (ZOSYN) IVPB 3.375 g  Status:  Discontinued     3.375 g 12.5 mL/hr over  240 Minutes Intravenous Every 8 hours 02/06/13 1759 02/07/13 0304   02/05/13 1422  vancomycin (VANCOCIN) IVPB 1000 mg/200 mL premix     1,000 mg 200 mL/hr over 60 Minutes Intravenous 60 min pre-op 02/05/13 1422 02/06/13 1050      Assessment/Plan: s/p Procedure(s): EVACUATION HEMATOMA (Right) 4 OR and a.m. for debridement. Discussed with the patient who understands.   LOS: 16 days   Merric Yost 02/22/2013, 9:22 AM

## 2013-02-22 NOTE — Progress Notes (Signed)
Subjective:  Good spirits Dreading OR again tomorrow Objective  Weight change:   Intake/Output Summary (Last 24 hours) at 02/22/13 0739 Last data filed at 02/22/13 0500  Gross per 24 hour  Intake    360 ml  Output   1560 ml  Net  -1200 ml   Physical Exam:  Blood pressure 151/74, pulse 81, temperature 98.2 F (36.8 C), temperature source Oral, resp. rate 21, height 5' (1.524 m), weight 77.3 kg (170 lb 6.7 oz), SpO2 94.00%. NAD Awake, alert and appropriate CV:RRR, nl s1s2 Healed median sternotomy scar LUNGS: Clear ABD: soft, nontender  EXT: Wound VAC in right groin  Trace edema   Recent Labs Lab 02/17/13 0500 02/18/13 0450 02/19/13 0523 02/20/13 0820 02/20/13 1747 02/21/13 0545 02/22/13 0520  NA 141 139 138 136 138 141 141  K 5.0 5.3* 5.7* 6.2* 6.0* 5.5* 4.7  CL 102 102 104 102 103 105 105  CO2 30 28 27 26 25 26 25   GLUCOSE 106* 152* 62* 116* 171* 178* 137*  BUN 48* 45* 42* 46* 45* 45* 46*  CREATININE 2.15* 2.14* 2.12* 2.27* 2.24* 2.31* 2.14*  CALCIUM 8.8 9.1 9.0 8.5 8.9 8.4 8.4  PHOS  --   --   --   --   --  6.0* 5.2*    Recent Labs Lab 02/21/13 0545 02/22/13 0520  ALBUMIN 2.2* 2.2*    Recent Labs Lab 02/19/13 0523 02/20/13 0820 02/21/13 0545 02/22/13 0520  WBC 10.0 9.4 8.5 8.2  HGB 9.5* 9.3* 9.6* 9.5*  HCT 30.0* 28.8* 30.5* 29.9*  MCV 94.6 96.3 96.5 96.5  PLT 153 168 179 185     Recent Labs Lab 02/21/13 1109 02/21/13 1609 02/21/13 1640 02/21/13 2100 02/22/13 0624  GLUCAP 183* 64* 72 117* 144*    Medications:   . aspirin  81 mg Oral Daily  . atorvastatin  10 mg Oral q1800  . budesonide-formoterol  2 puff Inhalation BID  . carvedilol  12.5 mg Oral BID WC  . ciprofloxacin  750 mg Oral Q breakfast  . clotrimazole  1 Applicatorful Vaginal QHS  . docusate sodium  100 mg Oral Daily  . DULoxetine  60 mg Oral Daily  . enoxaparin (LOVENOX) injection  30 mg Subcutaneous Q24H  . feeding supplement (NEPRO CARB STEADY)  237 mL Oral BID BM  .  feeding supplement (PRO-STAT SUGAR FREE 64)  30 mL Oral TID  . insulin glargine  28 Units Subcutaneous Daily  . multivitamin with minerals  1 tablet Oral Daily  . pantoprazole  40 mg Oral Daily  . pregabalin  50 mg Oral BID  . torsemide  20 mg Oral Daily     I  have reviewed scheduled and prn medications.  ASSESSMENT/RECOMMENDATIONS  75F w/ PVD s/p Fem-Pop bypass 9/16 complicated  by wound infection with Serratia s/p OR debridement 10/3,10/8,  evacuation of hematoma 10/9;  Known CKD with BL SCr 1.5 in September 2014 with AKI -  likely ATN. Peak creatinine 4.73 on 10/5 - recovered to around 2.12 and we signed off 10/13 with plan for f/u appt with Dr. Sabra Heck at [facility] on 03/12/13 at 1:15 PM .   Asked to resume following due to Interval development of hyperkalemia, progressive since 10/14 likely related to addition of losartan (unsure why started but has been discontinued), Ensure supplements and non-K restricted diet.    Assessment/Plan  1. AKI on CKD: d/t ATN. Peaked at 4.7  Nadir around 2.12 Crept up after after transient re-exposure  to ARB earlier in the week though no hypotension. Back to 2.1 today  Would not restart ARB in this patient given K issues in recovery phase of AKI. Can use alternate agents for HTN.  2. Hyperkalemia - exposure to ARB 10/14-10/16, plus high K suppls plus non K restricted diet.  S/p kayexelate. K 4.7 today  No further ARB as above 3. Volume -  Started torsemide 20/day on 10/17. Edema better, breathing better 4. S/p F-P bypass and inguinal wound infection with multiple debridements and w/ Serratia; on Cipro.  per VVS. For another op on Monday  Will follow at least through repeat surgery tomorrow to make sure no recurrent issues with K or renal function. Still has Nov 6 F/U scheduled with Dr. Marisue Humble at [facility] in November  Camille Bal, MD [facility] [phone] pager 02/22/2013, 7:39 AM

## 2013-02-23 ENCOUNTER — Encounter (HOSPITAL_COMMUNITY): Payer: Self-pay | Admitting: Anesthesiology

## 2013-02-23 ENCOUNTER — Encounter (HOSPITAL_COMMUNITY): Payer: Medicare Other | Admitting: Anesthesiology

## 2013-02-23 ENCOUNTER — Inpatient Hospital Stay (HOSPITAL_COMMUNITY): Payer: Medicare Other | Admitting: Anesthesiology

## 2013-02-23 ENCOUNTER — Encounter (HOSPITAL_COMMUNITY)
Admission: RE | Disposition: A | Discharge status: Home Health | Payer: Self-pay | Source: Ambulatory Visit | Attending: Vascular Surgery

## 2013-02-23 DIAGNOSIS — T8189XA Other complications of procedures, not elsewhere classified, initial encounter: Secondary | ICD-10-CM

## 2013-02-23 HISTORY — PX: I & D EXTREMITY: SHX5045

## 2013-02-23 HISTORY — PX: APPLICATION OF WOUND VAC: SHX5189

## 2013-02-23 LAB — RENAL FUNCTION PANEL
Albumin: 2.3 g/dL — ABNORMAL LOW (ref 3.5–5.2)
CO2: 29 mEq/L (ref 19–32)
Calcium: 8.6 mg/dL (ref 8.4–10.5)
Creatinine, Ser: 2.25 mg/dL — ABNORMAL HIGH (ref 0.50–1.10)
GFR calc Af Amer: 25 mL/min — ABNORMAL LOW (ref >90)
GFR calc non Af Amer: 21 mL/min — ABNORMAL LOW (ref >90)
Glucose, Bld: 149 mg/dL — ABNORMAL HIGH (ref 70–99)
Phosphorus: 3.7 mg/dL (ref 2.3–4.6)
Potassium: 5.1 mEq/L (ref 3.5–5.1)

## 2013-02-23 LAB — GLUCOSE, CAPILLARY
Glucose-Capillary: 164 mg/dL — ABNORMAL HIGH (ref 70–99)
Glucose-Capillary: 123 mg/dL — ABNORMAL HIGH (ref 70–99)
Glucose-Capillary: 125 mg/dL — ABNORMAL HIGH (ref 70–99)
Glucose-Capillary: 111 mg/dL — ABNORMAL HIGH (ref 70–99)
Glucose-Capillary: 105 mg/dL — ABNORMAL HIGH (ref 70–99)

## 2013-02-23 LAB — CBC
MCH: 30.6 pg (ref 26.0–34.0)
MCHC: 31.5 g/dL (ref 30.0–36.0)
MCV: 97 fL (ref 78.0–100.0)
Platelets: 184 10*3/uL (ref 150–400)
RBC: 3.04 MIL/uL — ABNORMAL LOW (ref 3.87–5.11)

## 2013-02-23 SURGERY — IRRIGATION AND DEBRIDEMENT EXTREMITY
Anesthesia: General | Site: Groin | Laterality: Right | Wound class: Dirty or Infected

## 2013-02-23 MED ORDER — LACTATED RINGERS IV SOLN
Freq: Once | INTRAVENOUS | Status: AC
  Administered 2013-02-23: 11:00:00 via INTRAVENOUS

## 2013-02-23 MED ORDER — 0.9 % SODIUM CHLORIDE (POUR BTL) OPTIME
TOPICAL | Status: DC | PRN
  Administered 2013-02-23: 1000 mL

## 2013-02-23 MED ORDER — LIDOCAINE HCL (CARDIAC) 20 MG/ML IV SOLN
INTRAVENOUS | Status: DC | PRN
  Administered 2013-02-23: 50 mg via INTRAVENOUS

## 2013-02-23 MED ORDER — FENTANYL CITRATE 0.05 MG/ML IJ SOLN
INTRAMUSCULAR | Status: DC | PRN
  Administered 2013-02-23: 50 ug via INTRAVENOUS

## 2013-02-23 MED ORDER — SODIUM CHLORIDE 0.9 % IV SOLN
INTRAVENOUS | Status: DC | PRN
  Administered 2013-02-23: 11:00:00 via INTRAVENOUS

## 2013-02-23 MED ORDER — EPHEDRINE SULFATE 50 MG/ML IJ SOLN
INTRAMUSCULAR | Status: DC | PRN
  Administered 2013-02-23: 15 mg via INTRAVENOUS
  Administered 2013-02-23 (×2): 10 mg via INTRAVENOUS
  Administered 2013-02-23: 20 mg via INTRAVENOUS

## 2013-02-23 MED ORDER — GLYCOPYRROLATE 0.2 MG/ML IJ SOLN
INTRAMUSCULAR | Status: DC | PRN
  Administered 2013-02-23: 0.2 mg via INTRAVENOUS

## 2013-02-23 MED ORDER — FENTANYL CITRATE 0.05 MG/ML IJ SOLN: 25.0000 ug | INTRAMUSCULAR | Status: DC | PRN

## 2013-02-23 MED ORDER — ONDANSETRON HCL 4 MG/2ML IJ SOLN
INTRAMUSCULAR | Status: DC | PRN
  Administered 2013-02-23: 4 mg via INTRAVENOUS

## 2013-02-23 MED ORDER — PHENYLEPHRINE HCL 10 MG/ML IJ SOLN
INTRAMUSCULAR | Status: DC | PRN
  Administered 2013-02-23: 80 ug via INTRAVENOUS
  Administered 2013-02-23 (×2): 120 ug via INTRAVENOUS

## 2013-02-23 MED ORDER — PROPOFOL 10 MG/ML IV BOLUS
INTRAVENOUS | Status: DC | PRN
  Administered 2013-02-23: 100 mg via INTRAVENOUS

## 2013-02-23 SURGICAL SUPPLY — 40 items
BANDAGE ELASTIC 4 VELCRO ST LF (GAUZE/BANDAGES/DRESSINGS) IMPLANT
BANDAGE ELASTIC 6 VELCRO ST LF (GAUZE/BANDAGES/DRESSINGS) IMPLANT
BANDAGE GAUZE ELAST BULKY 4 IN (GAUZE/BANDAGES/DRESSINGS) IMPLANT
CANISTER SUCTION 2500CC (MISCELLANEOUS) ×2 IMPLANT
CANISTER WOUND CARE 500ML ATS (WOUND CARE) ×1 IMPLANT
COVER SURGICAL LIGHT HANDLE (MISCELLANEOUS) ×2 IMPLANT
DRAPE PROXIMA HALF (DRAPES) ×1 IMPLANT
DRSG VAC ATS LRG SENSATRAC (GAUZE/BANDAGES/DRESSINGS) IMPLANT
DRSG VAC ATS MED SENSATRAC (GAUZE/BANDAGES/DRESSINGS) IMPLANT
DRSG VAC ATS SM SENSATRAC (GAUZE/BANDAGES/DRESSINGS) ×1 IMPLANT
ELECT REM PT RETURN 9FT ADLT (ELECTROSURGICAL) ×2
ELECTRODE REM PT RTRN 9FT ADLT (ELECTROSURGICAL) ×1 IMPLANT
FLUID NSS /IRRIG 3000 ML XXX (IV SOLUTION) IMPLANT
GLOVE BIO SURGEON STRL SZ 6.5 (GLOVE) ×2 IMPLANT
GLOVE BIO SURGEON STRL SZ7.5 (GLOVE) ×4 IMPLANT
GLOVE BIOGEL PI IND STRL 6.5 (GLOVE) IMPLANT
GLOVE BIOGEL PI INDICATOR 6.5 (GLOVE) ×2
GLOVE ECLIPSE 7.0 STRL STRAW (GLOVE) ×1 IMPLANT
GOWN PREVENTION PLUS XLARGE (GOWN DISPOSABLE) ×2 IMPLANT
GOWN STRL NON-REIN LRG LVL3 (GOWN DISPOSABLE) ×4 IMPLANT
GOWN STRL REIN XL XLG (GOWN DISPOSABLE) ×1 IMPLANT
HANDPIECE INTERPULSE COAX TIP (DISPOSABLE)
KIT BASIN OR (CUSTOM PROCEDURE TRAY) ×2 IMPLANT
KIT ROOM TURNOVER OR (KITS) ×2 IMPLANT
NS IRRIG 1000ML POUR BTL (IV SOLUTION) ×2 IMPLANT
PACK GENERAL/GYN (CUSTOM PROCEDURE TRAY) ×2 IMPLANT
PACK UNIVERSAL I (CUSTOM PROCEDURE TRAY) IMPLANT
PAD ARMBOARD 7.5X6 YLW CONV (MISCELLANEOUS) ×4 IMPLANT
PAD NEG PRESSURE SENSATRAC (MISCELLANEOUS) ×2 IMPLANT
SET HNDPC FAN SPRY TIP SCT (DISPOSABLE) IMPLANT
SPONGE GAUZE 4X4 12PLY (GAUZE/BANDAGES/DRESSINGS) ×2 IMPLANT
STAPLER VISISTAT 35W (STAPLE) IMPLANT
SUT ETHILON 3 0 PS 1 (SUTURE) IMPLANT
SUT VIC AB 2-0 CTX 36 (SUTURE) IMPLANT
SUT VIC AB 3-0 SH 27 (SUTURE)
SUT VIC AB 3-0 SH 27X BRD (SUTURE) IMPLANT
SUT VICRYL 4-0 PS2 18IN ABS (SUTURE) IMPLANT
TOWEL OR 17X24 6PK STRL BLUE (TOWEL DISPOSABLE) ×2 IMPLANT
TOWEL OR 17X26 10 PK STRL BLUE (TOWEL DISPOSABLE) ×2 IMPLANT
WATER STERILE IRR 1000ML POUR (IV SOLUTION) ×2 IMPLANT

## 2013-02-23 NOTE — Progress Notes (Signed)
NUTRITION FOLLOW UP  Intervention:    Continue Ensure Complete 3 times daily (350 kcals, 13 gm protein per 8 fl oz bottle)  Continue Prostat liquid 30 ml 3 times daily (100 kcals, 15 gm protein per dose) RD to follow for nutrition care plan  Nutrition Dx:   Inadequate oral intake, resolved  Goal:   Pt to meet >/= 90% of their estimated nutrition needs, met  Monitor:   PO & supplemental intake, weight, labs, I/O's  Assessment:   Patient with PMH of stroke and CABG; recently underwent right femoral to below-knee popliteal bypass on 9/16; began complaining of swelling in right groin and some redness; presented to the Vascular Surgery office with drainage from her right groin.   Patient s/p procedure 10/3:  DEBRIDEMENT RIGHT GROIN  PLACEMENT OF VAC DRESSING   Patient s/p procedure 10/8:  DEBRIDEMENT RIGHT GROIN & WOUND VAC APPLICATION   Patient s/p procedure 10/9:  EVACUATION HEMATOMA (Right)   Patient reports her appetite is doing better; PO intake variable at 25-50% per flowsheet records.  She is drinking her Ensure Complete & liquid protein (ie Prostat) supplements.  Nephrology following.  For OR today for debridement.   Height: Ht Readings from Last 1 Encounters:  02/06/13 5' (1.524 m)    Weight Status:   Wt Readings from Last 1 Encounters:  02/16/13 170 lb 6.7 oz (77.3 kg)    Re-estimated needs:  Kcal: 1500-1700 Protein: 100-110 gm Fluid: 1.5-1.7 L  Skin: wound VAC to right groin; Stage III pressure ulcer to right ankle  Diet Order: NPO   Intake/Output Summary (Last 24 hours) at 02/23/13 1032 Last data filed at 02/22/13 2155  Gross per 24 hour  Intake    120 ml  Output    700 ml  Net   -580 ml    Labs:   Recent Labs Lab 02/21/13 0545 02/22/13 0520 02/23/13 0550  NA 141 141 140  K 5.5* 4.7 5.1  CL 105 105 104  CO2 26 25 29   BUN 45* 46* 48*  CREATININE 2.31* 2.14* 2.25*  CALCIUM 8.4 8.4 8.6  PHOS 6.0* 5.2* 3.7  GLUCOSE 178* 137* 149*     CBG (last 3)   Recent Labs  02/22/13 1628 02/22/13 2144 02/23/13 0613  GLUCAP 173* 121* 164*    Scheduled Meds: . [facility] HOLD] aspirin  81 mg Oral Daily  . [facility] HOLD] atorvastatin  10 mg Oral q1800  . [MAR HOLD] budesonide-formoterol  2 puff Inhalation BID  . [facility] HOLD] carvedilol  12.5 mg Oral BID WC  . [facility] HOLD] ciprofloxacin  750 mg Oral Q breakfast  . [MAR HOLD] clotrimazole  1 Applicatorful Vaginal QHS  . [facility] HOLD] docusate sodium  100 mg Oral Daily  . [facility] HOLD] DULoxetine  60 mg Oral Daily  . [MAR HOLD] enoxaparin (LOVENOX) injection  30 mg Subcutaneous Q24H  . [MAR HOLD] feeding supplement (NEPRO CARB STEADY)  237 mL Oral BID BM  . [MAR HOLD] feeding supplement (PRO-STAT SUGAR FREE 64)  30 mL Oral TID  . [MAR HOLD] insulin glargine  28 Units Subcutaneous Daily  . [MAR HOLD] multivitamin with minerals  1 tablet Oral Daily  . [MAR HOLD] pantoprazole  40 mg Oral Daily  . [facility] HOLD] pregabalin  50 mg Oral BID  . [facility] HOLD] torsemide  20 mg Oral Daily    Continuous Infusions:   Maureen Chatters, RD, LDN Pager #: [phone] After-Hours Pager #: [phone]

## 2013-02-23 NOTE — Anesthesia Postprocedure Evaluation (Signed)
  Anesthesia Post-op Note  Patient: [name]  Procedure(s) Performed: Procedure(s): IRRIGATION AND DEBRIDEMENT RIGHT GROIN (Right) APPLICATION OF WOUND VAC (Right)  Patient Location: PACU  Anesthesia Type:General  Level of Consciousness: awake  Airway and Oxygen Therapy: Patient Spontanous Breathing  Post-op Pain: mild  Post-op Assessment: Post-op Vital signs reviewed  Post-op Vital Signs: Reviewed  Complications: No apparent anesthesia complications

## 2013-02-23 NOTE — Progress Notes (Signed)
Patient ID: [name], female   DOB: Sep 26, 1945, 67 y.o.   MRN: [id number] S:feels well, no complaints O:BP 117/70  Pulse 83  Temp(Src) 98.4 F (36.9 C) (Oral)  Resp 19  Ht 5' (1.524 m)  Wt 77.3 kg (170 lb 6.7 oz)  BMI 33.28 kg/m2  SpO2 90%  Intake/Output Summary (Last 24 hours) at 02/23/13 0946 Last data filed at 02/22/13 2155  Gross per 24 hour  Intake    120 ml  Output    900 ml  Net   -780 ml   Intake/Output: I/O last 3 completed shifts: In: 240 [P.O.:240] Out: 1860 [Urine:1800; Drains:60]  Intake/Output this shift:    Weight change:  Gen:WD WN WF in NAD CVS:no rub Resp:cta WUJ:[id number] Ext:tr edema on right leg with woundvac in place   Recent Labs Lab 02/18/13 0450 02/19/13 0523 02/20/13 0820 02/20/13 1747 02/21/13 0545 02/22/13 0520 02/23/13 0550  NA 139 138 136 138 141 141 140  K 5.3* 5.7* 6.2* 6.0* 5.5* 4.7 5.1  CL 102 104 102 103 105 105 104  CO2 28 27 26 25 26 25 29   GLUCOSE 152* 62* 116* 171* 178* 137* 149*  BUN 45* 42* 46* 45* 45* 46* 48*  CREATININE 2.14* 2.12* 2.27* 2.24* 2.31* 2.14* 2.25*  ALBUMIN  --   --   --   --  2.2* 2.2* 2.3*  CALCIUM 9.1 9.0 8.5 8.9 8.4 8.4 8.6  PHOS  --   --   --   --  6.0* 5.2* 3.7   Liver Function Tests:  Recent Labs Lab 02/21/13 0545 02/22/13 0520 02/23/13 0550  ALBUMIN 2.2* 2.2* 2.3*   No results found for this basename: LIPASE, AMYLASE,  in the last 168 hours No results found for this basename: AMMONIA,  in the last 168 hours CBC:  Recent Labs Lab 02/19/13 0523 02/20/13 0820 02/21/13 0545 02/22/13 0520 02/23/13 0550  WBC 10.0 9.4 8.5 8.2 8.4  HGB 9.5* 9.3* 9.6* 9.5* 9.3*  HCT 30.0* 28.8* 30.5* 29.9* 29.5*  MCV 94.6 96.3 96.5 96.5 97.0  PLT 153 168 179 185 184   Cardiac Enzymes: No results found for this basename: CKTOTAL, CKMB, CKMBINDEX, TROPONINI,  in the last 168 hours CBG:  Recent Labs Lab 02/22/13 0624 02/22/13 1058 02/22/13 1628 02/22/13 2144 02/23/13 0613  GLUCAP 144* 180*  173* 121* 164*    Iron Studies: No results found for this basename: IRON, TIBC, TRANSFERRIN, FERRITIN,  in the last 72 hours Studies/Results: No results found. Marland Kitchen aspirin  81 mg Oral Daily  . atorvastatin  10 mg Oral q1800  . budesonide-formoterol  2 puff Inhalation BID  . carvedilol  12.5 mg Oral BID WC  . ciprofloxacin  750 mg Oral Q breakfast  . clotrimazole  1 Applicatorful Vaginal QHS  . docusate sodium  100 mg Oral Daily  . DULoxetine  60 mg Oral Daily  . enoxaparin (LOVENOX) injection  30 mg Subcutaneous Q24H  . feeding supplement (NEPRO CARB STEADY)  237 mL Oral BID BM  . feeding supplement (PRO-STAT SUGAR FREE 64)  30 mL Oral TID  . insulin glargine  28 Units Subcutaneous Daily  . multivitamin with minerals  1 tablet Oral Daily  . pantoprazole  40 mg Oral Daily  . pregabalin  50 mg Oral BID  . torsemide  20 mg Oral Daily    BMET    Component Value Date/Time   NA 140 02/23/2013 0550   K 5.1 02/23/2013 0550   CL  104 02/23/2013 0550   CO2 29 02/23/2013 0550   GLUCOSE 149* 02/23/2013 0550   BUN 48* 02/23/2013 0550   CREATININE 2.25* 02/23/2013 0550   CALCIUM 8.6 02/23/2013 0550   GFRNONAA 21* 02/23/2013 0550   GFRAA 25* 02/23/2013 0550   CBC    Component Value Date/Time   WBC 8.4 02/23/2013 0550   WBC 9.2 12/19/2012 1601   RBC 3.04* 02/23/2013 0550   RBC 4.66 12/19/2012 1601   HGB 9.3* 02/23/2013 0550   HGB 13.7 12/19/2012 1601   HCT 29.5* 02/23/2013 0550   HCT 42.9 12/19/2012 1601   PLT 184 02/23/2013 0550   MCV 97.0 02/23/2013 0550   MCV 92.0 12/19/2012 1601   MCH 30.6 02/23/2013 0550   MCH 29.4 12/19/2012 1601   MCHC 31.5 02/23/2013 0550   MCHC 31.9 12/19/2012 1601   RDW 16.7* 02/23/2013 0550   LYMPHSABS 1.1 02/10/2013 0440   MONOABS 0.8 02/10/2013 0440   EOSABS 0.1 02/10/2013 0440   BASOSABS 0.0 02/10/2013 0440   Assessment/Plan:  1. AKI/CKD:  Non-oliguric, presumable related to ischemic ATN in setting of ABLA, surgeries, and acute infection.  Scr peaked  at 4.73 then improved to 2.12, reconsulted for the development of hyperkalemia and bump in Scr.  Felt to be related to the addition of an ARB during recovery of AKI.  Current lab values stable.  For repeat surgery. Cont to restrict K in diet and would not resume ACE/ARB given h/o hyperkalemia 2. 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  S/p debridement X 2 and evacuation of hematoma.  Currently on cipro and plan for another debridement today per VVS. 3. Hyperkalemia- as above.   4. HTN- stable 5. ABLA/Anemia of chronic disease:  Transfuse prn.  Hgb was running 12-15 prior to surgeries and AKI.  May need ESA. Will cont to follow 6. Disposition- when stable for discharge, pt already has  f/u appt with Dr. Sabra Heck at [facility] on 03/12/13 at 1:15 PM .   Sydell Prowell A

## 2013-02-23 NOTE — H&P (View-Only) (Signed)
Subjective: Interval History: none.. comfortable  Objective: Vital signs in last 24 hours: Temp:  [98.2 F (36.8 C)-98.5 F (36.9 C)] 98.5 F (36.9 C) (10/19 0450) Pulse Rate:  [64-82] 82 (10/19 0450) Resp:  [18] 18 (10/19 0450) BP: (127-159)/(72-82) 127/75 mmHg (10/19 0450) SpO2:  [90 %-100 %] 90 % (10/19 0450)  Intake/Output from previous day: 10/18 0701 - 10/19 0700 In: 360 [P.O.:360] Out: 1560 [Urine:1500; Drains:60] Intake/Output this shift: Total I/O In: 120 [P.O.:120] Out: -   No change in the right groin. No surrounding erythema. VAC in place.  Lab Results:  Recent Labs  02/21/13 0545 02/22/13 0520  WBC 8.5 8.2  HGB 9.6* 9.5*  HCT 30.5* 29.9*  PLT 179 185   BMET  Recent Labs  02/21/13 0545 02/22/13 0520  NA 141 141  K 5.5* 4.7  CL 105 105  CO2 26 25  GLUCOSE 178* 137*  BUN 45* 46*  CREATININE 2.31* 2.14*  CALCIUM 8.4 8.4    Studies/Results: Us Renal  02/09/2013   CLINICAL DATA:  Acute renal failure  EXAM: RENAL/URINARY TRACT ULTRASOUND COMPLETE  COMPARISON:  None  FINDINGS: Right Kidney  Length: 10.0 cm. Mild increased cortical echogenicity. No mass or hydronephrosis noted.  Left Kidney  Length: Measures 12.4 cm. Mild increase in cortical echogenicity. No mass or hydronephrosis.  Bladder:  Appears normal for degree of bladder distention.  IMPRESSION: 1. Bilateral echogenic kidneys suggestive of chronic medical renal disease.  2. No mass or hydronephrosis.   Electronically Signed   By: Taylor  Stroud M.D.   On: 02/09/2013 08:07   Dg Chest Port 1 View  02/11/2013   CLINICAL DATA:  Hypoxia, cough and congestion.  EXAM: PORTABLE CHEST - 1 VIEW  COMPARISON:  01/22/2013.  FINDINGS: Feeding tube is followed into the stomach with the tip projecting beyond the inferior margin of the image. Heart is enlarged. Mild diffuse bilateral airspace disease with consolidative changes at the left lung base. Probable left pleural effusion.  IMPRESSION: Favor congestive  heart failure. Difficult to exclude left lower lobe pneumonia.   Electronically Signed   By: Melinda  Blietz M.D.   On: 02/11/2013 10:40   Dg Abd Portable 1v  02/12/2013   CLINICAL DATA:  Feeding tube placement  EXAM: PORTABLE ABDOMEN - 1 VIEW  COMPARISON:  Prior chest x-ray 02/10/2013  FINDINGS: The tip of the feeding tube is in the gastric antrum. The bowel gas pattern is nonobstructive. Stable left basilar opacity. 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 Degenerative disc disease and lower lumbar facet arthropathy.  IMPRESSION: The tip of the enteric feeding tube is in the gastric antrum.   Electronically Signed   By: Heath  McCullough M.D.   On: 02/12/2013 20:18   Dg Abd Portable 1v  02/10/2013   *RADIOLOGY REPORT*  Clinical Data: Panda tube placement; check tip position.  PORTABLE ABDOMEN - 1 VIEW  Comparison: Abdominal radiograph performed 02/09/2013  Findings: The patient's Panda tube is noted ending at the antrum of the stomach.  The visualized bowel gas pattern is grossly unremarkable.  No free intra-abdominal air is identified, though evaluation for free air is suboptimal on a single supine view.  The patient's right hip prosthesis is incompletely imaged, but appears grossly unremarkable.  No acute osseous abnormalities are identified.  A complex left-sided pleural effusion is again noted.  IMPRESSION: Panda tube noted ending at the antrum of the stomach.   Original Report Authenticated By: Jeffrey Chang, M.D.   Dg Abd Portable 1v    02/09/2013   CLINICAL DATA:  Feeding tube replacement.  EXAM: PORTABLE ABDOMEN - 1 VIEW  COMPARISON:  One-view abdomen 02/08/2013.  FINDINGS: The tip of the small bore feeding tube is within the antrum of the stomach. A left pleural effusion and lower lobe airspace disease remains. The heart is enlarged.  IMPRESSION: The tip of a small bore feeding tube is within the gastric antrum.   Electronically Signed   By: Chris  Mattern M.D.   On: 02/09/2013 12:18    Dg Abd Portable 1v  02/08/2013   CLINICAL DATA:  Feeding tube placement.  EXAM: PORTABLE ABDOMEN - 1 VIEW  COMPARISON:  02/08/2013, 1814 hr.  FINDINGS: Advancement of weighted tip feeding tube, now with in the distal fundus and antrum of the stomach. Lumbar spondylosis and scoliosis appears similar.  IMPRESSION: Advancement of weighted tip feeding tube with the tip in the distal fundus near the antrum.   Electronically Signed   By: Geoffrey  Lamke M.D.   On: 02/08/2013 20:42   Dg Abd Portable 1v  02/08/2013   CLINICAL DATA:  Feeding tube placement  EXAM: PORTABLE ABDOMEN - 1 VIEW  COMPARISON:  Film from 1718 hrs  FINDINGS: Scattered large and small bowel gas is noted. A Dobbhoff catheter is now seen just beyond the gastroesophageal junction advanced from the prior exam. Persistent left basilar changes are seen. Degenerative change of the lumbar spine is noted.  IMPRESSION: Dobbhoff catheter now extends into the stomach but could be advanced further.   Electronically Signed   By: Mark  Lukens M.D.   On: 02/08/2013 18:20   Dg Abd Portable 1v  02/08/2013   CLINICAL DATA:  Confirmation of Panda placement.  EXAM: PORTABLE ABDOMEN - 1 VIEW  COMPARISON:  None.  FINDINGS: Panda tube tip is lodged in the distal esophagus and needs to be advanced. Gas pattern is nonspecific.  IMPRESSION: Panda tube needs advancement into the stomach   Electronically Signed   By: John  Curnes M.D.   On: 02/08/2013 17:27   Anti-infectives: Anti-infectives   Start     Dose/Rate Route Frequency Ordered Stop   02/18/13 0700  ciprofloxacin (CIPRO) tablet 750 mg     750 mg Oral Daily with breakfast 02/17/13 0842     02/18/13 0000  ciprofloxacin (CIPRO) 750 MG tablet     750 mg Oral Daily with breakfast 02/18/13 1516     02/13/13 1200  piperacillin-tazobactam (ZOSYN) IVPB 3.375 g  Status:  Discontinued     3.375 g 12.5 mL/hr over 240 Minutes Intravenous Every 8 hours 02/13/13 1037 02/17/13 0842   02/11/13 0600  vancomycin  (VANCOCIN) IVPB 1000 mg/200 mL premix  Status:  Discontinued     1,000 mg 200 mL/hr over 60 Minutes Intravenous Every 48 hours 02/10/13 1048 02/12/13 0818   02/08/13 1330  vancomycin (VANCOCIN) IVPB 1000 mg/200 mL premix     1,000 mg 200 mL/hr over 60 Minutes Intravenous  Once 02/08/13 1309 02/08/13 1600   02/07/13 1200  piperacillin-tazobactam (ZOSYN) IVPB 2.25 g  Status:  Discontinued     2.25 g 100 mL/hr over 30 Minutes Intravenous Every 8 hours 02/07/13 0304 02/13/13 1037   02/07/13 1000  vancomycin (VANCOCIN) IVPB 1000 mg/200 mL premix  Status:  Discontinued     1,000 mg 200 mL/hr over 60 Minutes Intravenous Every 24 hours 02/06/13 1801 02/07/13 0303   02/06/13 1830  piperacillin-tazobactam (ZOSYN) IVPB 3.375 g  Status:  Discontinued     3.375 g 12.5 mL/hr over   240 Minutes Intravenous Every 8 hours 02/06/13 1759 02/07/13 0304   02/05/13 1422  vancomycin (VANCOCIN) IVPB 1000 mg/200 mL premix     1,000 mg 200 mL/hr over 60 Minutes Intravenous 60 min pre-op 02/05/13 1422 02/06/13 1050      Assessment/Plan: s/p Procedure(s): EVACUATION HEMATOMA (Right) 4 OR and a.m. for debridement. Discussed with the patient who understands.   LOS: 16 days   Ray Glacken 02/22/2013, 9:22 AM    

## 2013-02-23 NOTE — Preoperative (Signed)
Beta Blockers   Reason not to administer Beta Blockers:Not Applicable 

## 2013-02-23 NOTE — Progress Notes (Signed)
Occupational Therapy Note  OT cancelled this date due to pt in procedure.  Will re-attempt tomorrow as medically appropriate.   Jeani Hawking, [place] [phone]

## 2013-02-23 NOTE — Anesthesia Preprocedure Evaluation (Addendum)
Anesthesia Evaluation  Patient identified by MRN, date of birth, ID band Patient awake    Reviewed: Allergy & Precautions, H&P , NPO status , Patient's Chart, lab work & pertinent test results  Airway Mallampati: II      Dental  (+) Edentulous Upper, Partial Lower and Dental Advidsory Given   Pulmonary shortness of breath, asthma , pneumonia -,          Cardiovascular hypertension, + angina + CAD, + Past MI, + Peripheral Vascular Disease and +CHF     Neuro/Psych PSYCHIATRIC DISORDERS CVA, Residual Symptoms    GI/Hepatic GERD-  ,  Endo/Other  diabetes, Poorly Controlled, Type 2  Renal/GU Renal disease     Musculoskeletal   Abdominal   Peds  Hematology   Anesthesia Other Findings   Reproductive/Obstetrics                          Anesthesia Physical Anesthesia Plan  ASA: IV  Anesthesia Plan: General   Post-op Pain Management:    Induction: Intravenous  Airway Management Planned: LMA  Additional Equipment:   Intra-op Plan:   Post-operative Plan: Extubation in OR  Informed Consent: I have reviewed the patients History and Physical, chart, labs and discussed the procedure including the risks, benefits and alternatives for the proposed anesthesia with the patient or authorized representative who has indicated his/her understanding and acceptance.   Dental advisory given and Dental Advisory Given  Plan Discussed with: CRNA and Anesthesiologist  Anesthesia Plan Comments:        Anesthesia Quick Evaluation

## 2013-02-23 NOTE — Interval H&P Note (Signed)
History and Physical Interval Note:  02/23/2013 11:23 AM  [name]  has presented today for surgery, with the diagnosis of NONHEALING WOUND RIGHT GROIN  The various methods of treatment have been discussed with the patient and family. After consideration of risks, benefits and other options for treatment, the patient has consented to  Procedure(s): IRRIGATION AND DEBRIDEMENT RIGHT GROIN (Right) APPLICATION OF WOUND VAC (Right) as a surgical intervention .  The patient's history has been reviewed, patient examined, no change in status, stable for surgery.  I have reviewed the patient's chart and labs.  Questions were answered to the patient's satisfaction.     Tricia Oaxaca E

## 2013-02-23 NOTE — Progress Notes (Signed)
PT Cancellation Note  Patient Details Name: [name] MRN: [id number] DOB: 1945/09/13   Cancelled Treatment:    Reason Eval/Treat Not Completed: Patient at procedure or test/unavailable (pt in OR).  PT to check back tomorrow.     Rollene Rotunda [name], PT, DPT [phone]   02/23/2013, 12:20 PM

## 2013-02-23 NOTE — Op Note (Signed)
Procedure: Debridement right groin  Preoperative Diagnosis: Poorly healing right groin wound  Postoperative diagnosis: Same  Anesthesia: Gen.  Assistant: Doreatha Massed PA-C  Operative findings: Necrotic anterior abdominal wall fascia and lateral soft tissues  Operative details: After obtaining informed consent, the patient was taken to the operating room. The patient was placed in supine position on the operating room table. After induction of general anesthesia, the patient's entire right groin was prepped and draped in usual sterile fashion. There is an open wound in the right groin. There was necrotic tissue laterally. This was all debrided sharply with a scalpel. The anterior fascia of the external oblique muscle was nonviable. This was excised. There was healthy muscle below this. There is good bleeding from the skin edge and the soft tissues laterally at this point. The wound was thoroughly irrigated with normal saline solution. A VAC dressing was applied to the right groin wound. The patient tolerated the procedure well and there were no complications. There is good granulation tissue over the graft at this point. There is no exposed graft. Instrument sponge and needle counts were correct at the end of the case. The patient was taken to recovery in stable condition.  Fabienne Bruns, MD [facility] Office: [phone] Pager: [phone]

## 2013-02-23 NOTE — Transfer of Care (Signed)
Immediate Anesthesia Transfer of Care Note  Patient: [name]  Procedure(s) Performed: Procedure(s): IRRIGATION AND DEBRIDEMENT RIGHT GROIN (Right) APPLICATION OF WOUND VAC (Right)  Patient Location: PACU  Anesthesia Type:General  Level of Consciousness: awake  Airway & Oxygen Therapy: Patient Spontanous Breathing and Patient connected to nasal cannula oxygen  Post-op Assessment: Report given to PACU RN, Post -op Vital signs reviewed and stable and Patient moving all extremities X 4  Post vital signs: Reviewed and stable  Complications: No apparent anesthesia complications

## 2013-02-24 ENCOUNTER — Encounter (HOSPITAL_COMMUNITY): Payer: Self-pay | Admitting: Vascular Surgery

## 2013-02-24 LAB — GLUCOSE, CAPILLARY
Glucose-Capillary: 119 mg/dL — ABNORMAL HIGH (ref 70–99)
Glucose-Capillary: 138 mg/dL — ABNORMAL HIGH (ref 70–99)

## 2013-02-24 LAB — RENAL FUNCTION PANEL
BUN: 44 mg/dL — ABNORMAL HIGH (ref 6–23)
CO2: 28 mEq/L (ref 19–32)
Chloride: 104 mEq/L (ref 96–112)
Creatinine, Ser: 1.95 mg/dL — ABNORMAL HIGH (ref 0.50–1.10)
GFR calc non Af Amer: 25 mL/min — ABNORMAL LOW (ref >90)

## 2013-02-24 LAB — CBC
HCT: 28.5 % — ABNORMAL LOW (ref 36.0–46.0)
MCV: 98.6 fL (ref 78.0–100.0)
Platelets: 176 10*3/uL (ref 150–400)
RBC: 2.89 MIL/uL — ABNORMAL LOW (ref 3.87–5.11)
RDW: 16.9 % — ABNORMAL HIGH (ref 11.5–15.5)
WBC: 8.1 10*3/uL (ref 4.0–10.5)

## 2013-02-24 LAB — SODIUM, URINE, RANDOM: Sodium, Ur: 52 mEq/L

## 2013-02-24 NOTE — Progress Notes (Addendum)
[facility] [place]  Subjective  - Doing well alert and oriented.   Objective 123/64 81 98.6 F (37 C) (Oral) 18 90%  Intake/Output Summary (Last 24 hours) at 02/24/13 0806 Last data filed at 02/24/13 0600  Gross per 24 hour  Intake   1220 ml  Output   1426 ml  Net   -206 ml    Wound vac in place Distal DP pulse palpable   Assessment/Planning: S/P I & D right groin, Fem-pop  AKI/CKD development of hyperkalemia and bump in Scr. Felt to be related to the addition of an ARB during recovery of AKI. Current lab values stable. For repeat surgery. Cont to restrict K in diet and would not resume ACE/ARB given h/o hyperkalemia.  Anemia of chronic disease.  Transfuse prn. Hgb was running 12-15 prior to surgeries and AKI.   Plan change wound vac tomorrow.    [name] [facility] 02/24/2013 8:06 AM --  Laboratory Lab Results:  Recent Labs  02/23/13 0550 02/24/13 0555  WBC 8.4 8.1  HGB 9.3* 8.8*  HCT 29.5* 28.5*  PLT 184 176   BMET  Recent Labs  02/23/13 0550 02/24/13 0555  NA 140 140  K 5.1 5.1  CL 104 104  CO2 29 28  GLUCOSE 149* 115*  BUN 48* 44*  CREATININE 2.25* 1.95*  CALCIUM 8.6 8.6    COAG Lab Results  Component Value Date   INR 1.09 01/13/2013   INR 1.03 04/16/2012   INR 1.07 05/17/2011   No results found for this basename: PTT    Agree with above recheck wound tomorrow.  Fabienne Bruns, MD [facility] [place] Office: [phone] Pager: [phone]

## 2013-02-24 NOTE — Progress Notes (Signed)
Occupational Therapy Treatment Patient Details Name: [name] MRN: [id number] DOB: 01-17-1946 Today's Date: 02/24/2013 Time: 2130-8657 OT Time Calculation (min): 27 min  OT Assessment / Plan / Recommendation  History of present illness Pt with infection to R going from previous R fem to BK pop BPG; s/p I&D Rt groin with wound VAC PMHx includes CABG, arthritis, polio   OT comments  Pt showing great progress and is overall S with most adls.  Will cont OT and focus on strengthening, activity tolerance and LE dressing.  Follow Up Recommendations  Home health OT;Supervision/Assistance - 24 hour    Barriers to Discharge       Equipment Recommendations  None recommended by OT    Recommendations for Other Services    Frequency Min 2X/week   Progress towards OT Goals Progress towards OT goals: Progressing toward goals;Goals met and updated - see care plan  Plan Discharge plan remains appropriate    Precautions / Restrictions Precautions Precautions: Fall Precaution Comments: wound vac to R groin Restrictions Weight Bearing Restrictions: No   Pertinent Vitals/Pain Vitals stable.    ADL  Eating/Feeding: Performed;Independent Where Assessed - Eating/Feeding: Chair Grooming: Performed;Wash/dry hands;Wash/dry face;Teeth care;Supervision/safety Where Assessed - Grooming: Supported standing Upper Body Bathing: Performed;Set up Where Assessed - Upper Body Bathing: Unsupported sitting Lower Body Bathing: Performed;Minimal assistance Where Assessed - Lower Body Bathing: Supported sit to stand Lower Body Dressing: Minimal assistance Where Assessed - Lower Body Dressing: Supported sit to stand Toilet Transfer: Research scientist (life sciences) Method:  (walked to br with therapist holding wound vac) Acupuncturist: Comfort height toilet;Grab bars Toileting - Architect and Hygiene: Performed;Supervision/safety Where Assessed - Medical sales representative and Hygiene: Standing Equipment Used: Rolling walker Transfers/Ambulation Related to ADLs: S with all tranfers.  Cues for hand placment ADL Comments: Pt did well with all adls.  Cont to need help crossing R leg over left IF she does not have pants or socks on.  If has pants or socks on she starts to pull leg up by pulling on pants or socks.    OT Diagnosis:    OT Problem List:   OT Treatment Interventions:     OT Goals(current goals can now be found in the care plan section) Acute Rehab OT Goals Patient Stated Goal: home with grandson OT Goal Formulation: With patient Time For Goal Achievement: 03/06/13 Potential to Achieve Goals: Good ADL Goals Pt Will Perform Grooming: with supervision;standing Pt Will Perform Upper Body Bathing: with set-up;sitting Pt Will Perform Lower Body Bathing: with modified independence;sit to/from stand Pt Will Perform Upper Body Dressing: with mod assist;sitting Pt Will Perform Lower Body Dressing: with modified independence;sit to/from stand Pt Will Transfer to Toilet: with supervision;ambulating;bedside commode Pt/caregiver will Perform Home Exercise Program: Increased strength;Both right and left upper extremity;With theraband;With Supervision  Visit Information  Last OT Received On: 02/24/13 Assistance Needed: +1 History of Present Illness: Pt with infection to R going from previous R fem to BK pop BPG; s/p I&D Rt groin with wound VAC PMHx includes CABG, arthritis, polio    Subjective Data      Prior Functioning       Cognition  Cognition Arousal/Alertness: Awake/alert Behavior During Therapy: WFL for tasks assessed/performed Overall Cognitive Status: Within Functional Limits for tasks assessed Current Attention Level: Divided Following Commands: Follows multi-step commands consistently    Mobility  Bed Mobility Bed Mobility: Not assessed Transfers Transfers: Sit to Stand;Stand to Sit Sit to Stand: 5: Supervision;With  armrests Stand to  Sit: 5: Supervision;With armrests Details for Transfer Assistance: Supervision for safety    Exercises      Balance Balance Balance Assessed: Yes Static Standing Balance Static Standing - Balance Support: Bilateral upper extremity supported Static Standing - Level of Assistance: 5: Stand by assistance Static Standing - Comment/# of Minutes: 3 at sink Dynamic Standing Balance Dynamic Standing - Balance Support: Bilateral upper extremity supported;During functional activity Dynamic Standing - Level of Assistance: 5: Stand by assistance   End of Session OT - End of Session Equipment Utilized During Treatment: Rolling walker Patient left: in chair;with call bell/phone within reach Nurse Communication: Mobility status  GO     Hope Budds 02/24/2013, 9:56 AM

## 2013-02-24 NOTE — Progress Notes (Signed)
Patient ID: [name], female   DOB: 1945-12-30, 67 y.o.   MRN: [id number] S:feels well no new c/o O:BP 123/64  Pulse 81  Temp(Src) 98.6 F (37 C) (Oral)  Resp 18  Ht 5' (1.524 m)  Wt 81.285 kg (179 lb 3.2 oz)  BMI 35 kg/m2  SpO2 90%  Intake/Output Summary (Last 24 hours) at 02/24/13 0854 Last data filed at 02/24/13 0600  Gross per 24 hour  Intake   1220 ml  Output   1426 ml  Net   -206 ml   Intake/Output: I/O last 3 completed shifts: In: 1220 [P.O.:720; I.V.:500] Out: 1726 [Urine:1550; Drains:150; Stool:1; Blood:25]  Intake/Output this shift:    Weight change:  Gen:WD WN WF in NAD CVS:no rub Resp:cta WUJ:WJXBJY Ext:+edema   Recent Labs Lab 02/19/13 0523 02/20/13 0820 02/20/13 1747 02/21/13 0545 02/22/13 0520 02/23/13 0550 02/24/13 0555  NA 138 136 138 141 141 140 140  K 5.7* 6.2* 6.0* 5.5* 4.7 5.1 5.1  CL 104 102 103 105 105 104 104  CO2 27 26 25 26 25 29 28   GLUCOSE 62* 116* 171* 178* 137* 149* 115*  BUN 42* 46* 45* 45* 46* 48* 44*  CREATININE 2.12* 2.27* 2.24* 2.31* 2.14* 2.25* 1.95*  ALBUMIN  --   --   --  2.2* 2.2* 2.3* 2.2*  CALCIUM 9.0 8.5 8.9 8.4 8.4 8.6 8.6  PHOS  --   --   --  6.0* 5.2* 3.7 4.1   Liver Function Tests:  Recent Labs Lab 02/22/13 0520 02/23/13 0550 02/24/13 0555  ALBUMIN 2.2* 2.3* 2.2*   No results found for this basename: LIPASE, AMYLASE,  in the last 168 hours No results found for this basename: AMMONIA,  in the last 168 hours CBC:  Recent Labs Lab 02/20/13 0820 02/21/13 0545 02/22/13 0520 02/23/13 0550 02/24/13 0555  WBC 9.4 8.5 8.2 8.4 8.1  HGB 9.3* 9.6* 9.5* 9.3* 8.8*  HCT 28.8* 30.5* 29.9* 29.5* 28.5*  MCV 96.3 96.5 96.5 97.0 98.6  PLT 168 179 185 184 176   Cardiac Enzymes: No results found for this basename: CKTOTAL, CKMB, CKMBINDEX, TROPONINI,  in the last 168 hours CBG:  Recent Labs Lab 02/23/13 1317 02/23/13 1452 02/23/13 1632 02/23/13 2057 02/24/13 0615  GLUCAP 125* 116* 111* 105* 119*     Iron Studies: No results found for this basename: IRON, TIBC, TRANSFERRIN, FERRITIN,  in the last 72 hours Studies/Results: No results found. Marland Kitchen aspirin  81 mg Oral Daily  . atorvastatin  10 mg Oral q1800  . budesonide-formoterol  2 puff Inhalation BID  . carvedilol  12.5 mg Oral BID WC  . ciprofloxacin  750 mg Oral Q breakfast  . clotrimazole  1 Applicatorful Vaginal QHS  . docusate sodium  100 mg Oral Daily  . DULoxetine  60 mg Oral Daily  . enoxaparin (LOVENOX) injection  30 mg Subcutaneous Q24H  . feeding supplement (NEPRO CARB STEADY)  237 mL Oral BID BM  . feeding supplement (PRO-STAT SUGAR FREE 64)  30 mL Oral TID  . insulin glargine  28 Units Subcutaneous Daily  . multivitamin with minerals  1 tablet Oral Daily  . pantoprazole  40 mg Oral Daily  . pregabalin  50 mg Oral BID  . torsemide  20 mg Oral Daily    BMET    Component Value Date/Time   NA 140 02/24/2013 0555   K 5.1 02/24/2013 0555   CL 104 02/24/2013 0555   CO2 28 02/24/2013 0555  GLUCOSE 115* 02/24/2013 0555   BUN 44* 02/24/2013 0555   CREATININE 1.95* 02/24/2013 0555   CALCIUM 8.6 02/24/2013 0555   GFRNONAA 25* 02/24/2013 0555   GFRAA 29* 02/24/2013 0555   CBC    Component Value Date/Time   WBC 8.1 02/24/2013 0555   WBC 9.2 12/19/2012 1601   RBC 2.89* 02/24/2013 0555   RBC 4.66 12/19/2012 1601   HGB 8.8* 02/24/2013 0555   HGB 13.7 12/19/2012 1601   HCT 28.5* 02/24/2013 0555   HCT 42.9 12/19/2012 1601   PLT 176 02/24/2013 0555   MCV 98.6 02/24/2013 0555   MCV 92.0 12/19/2012 1601   MCH 30.4 02/24/2013 0555   MCH 29.4 12/19/2012 1601   MCHC 30.9 02/24/2013 0555   MCHC 31.9 12/19/2012 1601   RDW 16.9* 02/24/2013 0555   LYMPHSABS 1.1 02/10/2013 0440   MONOABS 0.8 02/10/2013 0440   EOSABS 0.1 02/10/2013 0440   BASOSABS 0.0 02/10/2013 0440     Assessment/Plan:  1. AKI/CKD: Non-oliguric, presumable related to ischemic ATN in setting of ABLA, surgeries, and acute infection. Scr peaked at 4.73 then  improved to 2.12, reconsulted for the development of hyperkalemia and bump in Scr. Felt to be related to the addition of an ARB during recovery of AKI.  1. Scr continues to improve 2. Cont to restrict K in diet and would not resume ACE/ARB given h/o hyperkalemia 3. Will check FeNa as she may require a small bolus of NS to help with K excretion given ABLA and prerenal picture with hypoalbuminemia 4. If FeNa is not low, will increase torsemide to her outpt dose of 40mg  and follow K/Scr 2. 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 S/p debridement X 2 and evacuation of hematoma. Currently on cipro s/p debridement yesterday per [facility]. 3. Hyperkalemia- as above.  4. HTN- stable 5. ABLA/Anemia of chronic disease: Transfuse prn. Hgb was running 12-15 prior to surgeries and AKI. May need ESA. Will cont to follow 6. Disposition- when stable for discharge, pt already has f/u appt with Dr. Sabra Heck at [facility] on 03/12/13 at 1:15 PM .  7.   [name] A

## 2013-02-24 NOTE — Progress Notes (Signed)
Physical Therapy Treatment Patient Details Name: [name] MRN: [id number] DOB: Sep 06, 1945 Today's Date: 02/24/2013 Time: 4098-1191 PT Time Calculation (min): 24 min  PT Assessment / Plan / Recommendation  History of Present Illness Pt with infection to R going from previous R fem to BK pop BPG; s/p I&D Rt groin with wound VAC PMHx includes CABG, arthritis, polio. Pt with repeat I&D and wound VAC on 02/23/13.   PT Comments   Pt making slow steady progress.  Follow Up Recommendations  Home health PT;Supervision/Assistance - 24 hour     Does the patient have the potential to tolerate intense rehabilitation     Barriers to Discharge        Equipment Recommendations  None recommended by PT    Recommendations for Other Services    Frequency Min 3X/week   Progress towards PT Goals Progress towards PT goals: Goals met and updated - see care plan  Plan Current plan remains appropriate    Precautions / Restrictions Precautions Precautions: Fall Precaution Comments: wound vac to R groin   Pertinent Vitals/Pain No c/o's.    Mobility  Transfers Sit to Stand: 5: Supervision;With armrests;From chair/3-in-1 Stand to Sit: 5: Supervision;With armrests;To chair/3-in-1 Details for Transfer Assistance: Supervision for safety Ambulation/Gait Ambulation/Gait Assistance: 4: Min guard Ambulation Distance (Feet): 80 Feet Assistive device: Rolling walker Ambulation/Gait Assistance Details: Verbal cues to stand more erect and stay closer to walkler.  Pt had to stop and prop forearms on walker 4 times to rest. Gait Pattern: Step-through pattern;Decreased step length - right;Decreased step length - left;Trunk flexed Gait velocity: decreased    Exercises     PT Diagnosis:    PT Problem List:   PT Treatment Interventions:     PT Goals (current goals can now be found in the care plan section) Acute Rehab PT Goals Patient Stated Goal: home with grandson PT Goal Formulation: With  patient Time For Goal Achievement: 03/03/13 Potential to Achieve Goals: Good  Visit Information  Last PT Received On: 02/24/13 Assistance Needed: +1 History of Present Illness: Pt with infection to R going from previous R fem to BK pop BPG; s/p I&D Rt groin with wound VAC PMHx includes CABG, arthritis, polio. Pt with repeat I&D and wound VAC on 02/23/13.    Subjective Data  Patient Stated Goal: home with grandson   Cognition  Cognition Arousal/Alertness: Awake/alert Behavior During Therapy: WFL for tasks assessed/performed Overall Cognitive Status: Within Functional Limits for tasks assessed    Balance  Static Standing Balance Static Standing - Balance Support: Bilateral upper extremity supported;During functional activity Static Standing - Level of Assistance: 5: Stand by assistance  End of Session PT - End of Session Activity Tolerance: Patient limited by fatigue Patient left: in chair;with call bell/phone within reach Nurse Communication: Mobility status   GP     [facility] 02/24/2013, 3:50 PM  [facility] PT [phone]

## 2013-02-24 NOTE — Progress Notes (Signed)
Patient has not vded. since surg. Patient tried and was unable to. Bladder scan patient and got 806 ml R.N. Aware. Dr. Darrick Penna notified see orders for foley. Lafonda Mosses R.N. Inserted foley F14 . Patient tol. Well.

## 2013-02-25 LAB — GLUCOSE, CAPILLARY
Glucose-Capillary: 113 mg/dL — ABNORMAL HIGH (ref 70–99)
Glucose-Capillary: 112 mg/dL — ABNORMAL HIGH (ref 70–99)

## 2013-02-25 LAB — CBC
HCT: 27.9 % — ABNORMAL LOW (ref 36.0–46.0)
Hemoglobin: 8.8 g/dL — ABNORMAL LOW (ref 12.0–15.0)
MCH: 31.1 pg (ref 26.0–34.0)
MCV: 98.6 fL (ref 78.0–100.0)
RDW: 16.9 % — ABNORMAL HIGH (ref 11.5–15.5)
WBC: 6.7 10*3/uL (ref 4.0–10.5)

## 2013-02-25 LAB — RENAL FUNCTION PANEL
BUN: 43 mg/dL — ABNORMAL HIGH (ref 6–23)
CO2: 22 mEq/L (ref 19–32)
Calcium: 8.6 mg/dL (ref 8.4–10.5)
Chloride: 101 mEq/L (ref 96–112)
Creatinine, Ser: 2.06 mg/dL — ABNORMAL HIGH (ref 0.50–1.10)
Glucose, Bld: 115 mg/dL — ABNORMAL HIGH (ref 70–99)

## 2013-02-25 MED ORDER — TORSEMIDE 20 MG PO TABS
40.0000 mg | ORAL_TABLET | Freq: Every day | ORAL | Status: DC
  Administered 2013-02-25 – 2013-02-27 (×3): 40 mg via ORAL
  Filled 2013-02-25 (×3): qty 2

## 2013-02-25 NOTE — Progress Notes (Signed)
Wound vac dressing changed per order. Tissue pink with no odor, redness, or s/s of infection. Wound vac set to suction. Patient tolerated well. Will continue to monitor.[name]

## 2013-02-25 NOTE — Progress Notes (Signed)
Physical Therapy Treatment Patient Details Name: [name] MRN: [id number] DOB: 1945/06/25 Today's Date: 02/25/2013 Time: 8295-6213 PT Time Calculation (min): 24 min  PT Assessment / Plan / Recommendation  History of Present Illness Pt with infection to R going from previous R fem to BK pop BPG; s/p I&D Rt groin with wound VAC PMHx includes CABG, arthritis, polio. Pt with repeat I&D and wound VAC on 02/23/13.   PT Comments   Pt admitted with above. Pt currently with functional limitations due to continued  Deficits with weakness and endurance for activity.  Pt will benefit from skilled PT to increase their independence and safety with mobility to allow discharge to the venue listed below.   Follow Up Recommendations  Home health PT;Supervision/Assistance - 24 hour                 Equipment Recommendations  None recommended by PT        Frequency Min 3X/week   Progress towards PT Goals Progress towards PT goals: Progressing toward goals  Plan Current plan remains appropriate    Precautions / Restrictions Precautions Precautions: Fall Precaution Comments: wound vac to R groin Restrictions Weight Bearing Restrictions: No   Pertinent Vitals/Pain VSS, some right groin pain - just medicated by nursing    Mobility  Bed Mobility Bed Mobility: Supine to Sit;Sitting - Scoot to Edge of Bed Supine to Sit: 5: Supervision;With rails Sitting - Scoot to Edge of Bed: 5: Supervision Transfers Sit to Stand: 5: Supervision;With upper extremity assist;From bed;From toilet Stand to Sit: 5: Supervision;With upper extremity assist;To chair/3-in-1 Details for Transfer Assistance: verbal cues for hand placement Ambulation/Gait Ambulation/Gait Assistance: 4: Min guard Ambulation Distance (Feet): 150 Feet Assistive device: Rolling walker Ambulation/Gait Assistance Details: Verbal cues to stand tall and stay close to RW.  Pt stops multiple times and props forearms on RW to rest.   Gait  Pattern: Step-through pattern;Decreased step length - right;Decreased step length - left;Trunk flexed Gait velocity: decreased Stairs: No Wheelchair Mobility Wheelchair Mobility: No    PT Goals (current goals can now be found in the care plan section)    Visit Information  Last PT Received On: 02/25/13 Assistance Needed: +1 History of Present Illness: Pt with infection to R going from previous R fem to BK pop BPG; s/p I&D Rt groin with wound VAC PMHx includes CABG, arthritis, polio. Pt with repeat I&D and wound VAC on 02/23/13.    Subjective Data  Subjective: "I willtry to go.  My wound vac was just changed."   Cognition  Cognition Arousal/Alertness: Awake/alert Behavior During Therapy: Flat affect Overall Cognitive Status: Within Functional Limits for tasks assessed    Balance  Static Standing Balance Static Standing - Balance Support: Bilateral upper extremity supported;During functional activity Static Standing - Level of Assistance: 5: Stand by assistance Static Standing - Comment/# of Minutes: 2 min with RW without LOB  End of Session PT - End of Session Equipment Utilized During Treatment: Gait belt Activity Tolerance: Patient limited by fatigue Patient left: in chair;with call bell/phone within reach Nurse Communication: Mobility status        [name] 02/25/2013, 3:45 PM [facility] Acute Rehabilitation [phone] (pager)

## 2013-02-25 NOTE — Progress Notes (Signed)
Occupational Therapy Treatment Patient Details Name: [name] MRN: [id number] DOB: [birth year] Today's Date: 02/25/2013 Time: 8469-6295 OT Time Calculation (min): 27 min  OT Assessment / Plan / Recommendation  History of present illness Pt with infection to R going from previous R fem to BK pop BPG; s/p I&D Rt groin with wound VAC PMHx includes CABG, arthritis, polio. Pt with repeat I&D and wound VAC on 02/23/13.   OT comments  Pt continues to make steady progress.  Currently, supervision for all BADLs, except min A for LB bathing and dressing.   Follow Up Recommendations  Home health OT;Supervision/Assistance - 24 hour    Barriers to Discharge       Equipment Recommendations  None recommended by OT    Recommendations for Other Services    Frequency Min 2X/week   Progress towards OT Goals Progress towards OT goals: Progressing toward goals  Plan Discharge plan remains appropriate    Precautions / Restrictions Precautions Precautions: Fall Precaution Comments: wound vac to R groin   Pertinent Vitals/Pain     ADL  Lower Body Dressing: Minimal assistance Where Assessed - Lower Body Dressing: Supported sit to stand Toilet Transfer: Supervision/safety Statistician Method: Sit to Barista: Comfort height toilet;Grab bars Toileting - Architect and Hygiene: Supervision/safety Where Assessed - Engineer, mining and Hygiene: Standing Equipment Used: Rolling walker Transfers/Ambulation Related to ADLs: supervision with ambulation.   ADL Comments: Pt continues to be dependent on lifting LEs by pant leg or socks.  She is unable to don socks, if there isn't already a clothing item on her LEs to pull on.  She does report she has a sock aid at home.  Pt takes frequent standing rest breaks when standing - reports due to chronic back pain    OT Diagnosis:    OT Problem List:   OT Treatment Interventions:     OT Goals(current  goals can now be found in the care plan section) Acute Rehab OT Goals Patient Stated Goal: home with grandson OT Goal Formulation: With patient Time For Goal Achievement: 03/06/13 Potential to Achieve Goals: Good ADL Goals Pt Will Perform Grooming: with supervision;standing Pt Will Perform Upper Body Bathing: with set-up;sitting Pt Will Perform Lower Body Bathing: with modified independence;sit to/from stand Pt Will Perform Upper Body Dressing: with mod assist;sitting Pt Will Perform Lower Body Dressing: with modified independence;sit to/from stand Pt Will Transfer to Toilet: with supervision;ambulating;bedside commode Pt/caregiver will Perform Home Exercise Program: Increased strength;Both right and left upper extremity;With theraband;With Supervision  Visit Information  Last OT Received On: 02/25/13 Assistance Needed: +1 History of Present Illness: Pt with infection to R going from previous R fem to BK pop BPG; s/p I&D Rt groin with wound VAC PMHx includes CABG, arthritis, polio. Pt with repeat I&D and wound VAC on 02/23/13.    Subjective Data      Prior Functioning       Cognition  Cognition Arousal/Alertness: Awake/alert Behavior During Therapy: Flat affect Overall Cognitive Status: Within Functional Limits for tasks assessed    Mobility  Bed Mobility Bed Mobility: Supine to Sit;Sitting - Scoot to Edge of Bed Supine to Sit: 5: Supervision;With rails Sitting - Scoot to Edge of Bed: 5: Supervision Transfers Transfers: Sit to Stand;Stand to Sit Sit to Stand: 5: Supervision;With upper extremity assist;From bed;From toilet Stand to Sit: 5: Supervision;With upper extremity assist;To chair/3-in-1 Details for Transfer Assistance: verbal cues for hand placement    Exercises  Balance     End of Session OT - End of Session Equipment Utilized During Treatment: Rolling walker Activity Tolerance: Patient limited by fatigue Patient left: in chair;with call bell/phone  within reach Nurse Communication: Mobility status  GO     Aleksandar Duve, Ursula Alert M 02/25/2013, 11:22 AM

## 2013-02-25 NOTE — Progress Notes (Signed)
[facility] [place]  Subjective  - Feels ok   Objective 113/66 68 98.7 F (37.1 C) (Oral) 18 90%  Intake/Output Summary (Last 24 hours) at 02/25/13 1246 Last data filed at 02/25/13 1000  Gross per 24 hour  Intake    840 ml  Output    925 ml  Net    -85 ml   Right groin wound continues to granulate  Assessment/Planning: Continue VAC Plan for d/c home Friday 10/24  Taz Vanness E 02/25/2013 12:46 PM --  Laboratory Lab Results:  Recent Labs  02/24/13 0555 02/25/13 0545  WBC 8.1 6.7  HGB 8.8* 8.8*  HCT 28.5* 27.9*  PLT 176 153   BMET  Recent Labs  02/24/13 0555 02/25/13 0545  NA 140 137  K 5.1 5.0  CL 104 101  CO2 28 22  GLUCOSE 115* 115*  BUN 44* 43*  CREATININE 1.95* 2.06*  CALCIUM 8.6 8.6    COAG Lab Results  Component Value Date   INR 1.09 01/13/2013   INR 1.03 04/16/2012   INR 1.07 05/17/2011   No results found for this basename: PTT

## 2013-02-25 NOTE — Progress Notes (Signed)
Patient ID: [name], female   DOB: 1946/04/15, 66 y.o.   MRN: [id number] S:feels well, no new complaints O:BP 138/72  Pulse 78  Temp(Src) 98.7 F (37.1 C) (Oral)  Resp 18  Ht 5' (1.524 m)  Wt 75.6 kg (166 lb 10.7 oz)  BMI 32.55 kg/m2  SpO2 93%  Intake/Output Summary (Last 24 hours) at 02/25/13 1008 Last data filed at 02/25/13 0800  Gross per 24 hour  Intake    840 ml  Output    575 ml  Net    265 ml   Intake/Output: I/O last 3 completed shifts: In: 1440 [P.O.:1440] Out: 2025 [Urine:1800; Drains:225]  Intake/Output this shift:  Total I/O In: 120 [P.O.:120] Out: -  Weight change: -5.685 kg (-12 lb 8.5 oz) Gen:WD obese WF in NAD CVS:no rub Resp:CTA JYN:WGNFAO Ext:tr-1+ pretib edema   Recent Labs Lab 02/20/13 0820 02/20/13 1747 02/21/13 0545 02/22/13 0520 02/23/13 0550 02/24/13 0555 02/25/13 0545  NA 136 138 141 141 140 140 137  K 6.2* 6.0* 5.5* 4.7 5.1 5.1 5.0  CL 102 103 105 105 104 104 101  CO2 26 25 26 25 29 28 22   GLUCOSE 116* 171* 178* 137* 149* 115* 115*  BUN 46* 45* 45* 46* 48* 44* 43*  CREATININE 2.27* 2.24* 2.31* 2.14* 2.25* 1.95* 2.06*  ALBUMIN  --   --  2.2* 2.2* 2.3* 2.2* 2.2*  CALCIUM 8.5 8.9 8.4 8.4 8.6 8.6 8.6  PHOS  --   --  6.0* 5.2* 3.7 4.1 4.1   Liver Function Tests:  Recent Labs Lab 02/23/13 0550 02/24/13 0555 02/25/13 0545  ALBUMIN 2.3* 2.2* 2.2*   No results found for this basename: LIPASE, AMYLASE,  in the last 168 hours No results found for this basename: AMMONIA,  in the last 168 hours CBC:  Recent Labs Lab 02/21/13 0545 02/22/13 0520 02/23/13 0550 02/24/13 0555 02/25/13 0545  WBC 8.5 8.2 8.4 8.1 6.7  HGB 9.6* 9.5* 9.3* 8.8* 8.8*  HCT 30.5* 29.9* 29.5* 28.5* 27.9*  MCV 96.5 96.5 97.0 98.6 98.6  PLT 179 185 184 176 153   Cardiac Enzymes: No results found for this basename: CKTOTAL, CKMB, CKMBINDEX, TROPONINI,  in the last 168 hours CBG:  Recent Labs Lab 02/24/13 0615 02/24/13 1116 02/24/13 1642  02/24/13 2133 02/25/13 0609  GLUCAP 119* 138* 134* 205* 116*    Iron Studies: No results found for this basename: IRON, TIBC, TRANSFERRIN, FERRITIN,  in the last 72 hours Studies/Results: No results found. Marland Kitchen aspirin  81 mg Oral Daily  . atorvastatin  10 mg Oral q1800  . budesonide-formoterol  2 puff Inhalation BID  . carvedilol  12.5 mg Oral BID WC  . ciprofloxacin  750 mg Oral Q breakfast  . docusate sodium  100 mg Oral Daily  . DULoxetine  60 mg Oral Daily  . enoxaparin (LOVENOX) injection  30 mg Subcutaneous Q24H  . feeding supplement (NEPRO CARB STEADY)  237 mL Oral BID BM  . feeding supplement (PRO-STAT SUGAR FREE 64)  30 mL Oral TID  . insulin glargine  28 Units Subcutaneous Daily  . multivitamin with minerals  1 tablet Oral Daily  . pantoprazole  40 mg Oral Daily  . pregabalin  50 mg Oral BID  . [START ON 02/26/2013] torsemide  40 mg Oral Daily    BMET    Component Value Date/Time   NA 137 02/25/2013 0545   K 5.0 02/25/2013 0545   CL 101 02/25/2013 0545   CO2  22 02/25/2013 0545   GLUCOSE 115* 02/25/2013 0545   BUN 43* 02/25/2013 0545   CREATININE 2.06* 02/25/2013 0545   CALCIUM 8.6 02/25/2013 0545   GFRNONAA 24* 02/25/2013 0545   GFRAA 28* 02/25/2013 0545   CBC    Component Value Date/Time   WBC 6.7 02/25/2013 0545   WBC 9.2 12/19/2012 1601   RBC 2.83* 02/25/2013 0545   RBC 4.66 12/19/2012 1601   HGB 8.8* 02/25/2013 0545   HGB 13.7 12/19/2012 1601   HCT 27.9* 02/25/2013 0545   HCT 42.9 12/19/2012 1601   PLT 153 02/25/2013 0545   MCV 98.6 02/25/2013 0545   MCV 92.0 12/19/2012 1601   MCH 31.1 02/25/2013 0545   MCH 29.4 12/19/2012 1601   MCHC 31.5 02/25/2013 0545   MCHC 31.9 12/19/2012 1601   RDW 16.9* 02/25/2013 0545   LYMPHSABS 1.1 02/10/2013 0440   MONOABS 0.8 02/10/2013 0440   EOSABS 0.1 02/10/2013 0440   BASOSABS 0.0 02/10/2013 0440     Assessment/Plan:  1. AKI/CKD: Non-oliguric, presumable related to ischemic ATN in setting of ABLA, surgeries,  resumption of ARB, and acute infection. Scr peaked at 4.73 then improved to 2.12 reconsulted for the development of hyperkalemia and bump in Scr. Felt to be related to the addition of an ARB during recovery of AKI.  1. Scr continues to improve 2. Cont to restrict K in diet and would not resume ACE/ARB given h/o hyperkalemia 3. Resume outpt torsemide dose of 40mg  daily and follow 2. 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 S/p debridement X 2 and evacuation of hematoma. Currently on cipro s/p debridement yesterday per [facility]. 3. Hyperkalemia- as above. Should improve with torsemide and K restriction 4. HTN- stable 5. ABLA/Anemia of chronic disease: Transfuse prn. Hgb was running 12-15 prior to surgeries and AKI. May need ESA. Will cont to follow 6. Disposition- when stable for discharge, pt already has f/u appt with Dr. Sabra Heck at [facility] on 03/12/13 at 1:15 PM .  7. Will sign off.  Please call with questions or concern.  Aliviana Burdell A

## 2013-02-26 LAB — RENAL FUNCTION PANEL
Albumin: 2.3 g/dL — ABNORMAL LOW (ref 3.5–5.2)
BUN: 43 mg/dL — ABNORMAL HIGH (ref 6–23)
Chloride: 98 mEq/L (ref 96–112)
GFR calc Af Amer: 29 mL/min — ABNORMAL LOW (ref >90)
Glucose, Bld: 133 mg/dL — ABNORMAL HIGH (ref 70–99)
Phosphorus: 3.8 mg/dL (ref 2.3–4.6)
Potassium: 5.1 mEq/L (ref 3.5–5.1)
Sodium: 134 mEq/L — ABNORMAL LOW (ref 135–145)

## 2013-02-26 LAB — GLUCOSE, CAPILLARY
Glucose-Capillary: 159 mg/dL — ABNORMAL HIGH (ref 70–99)
Glucose-Capillary: 183 mg/dL — ABNORMAL HIGH (ref 70–99)
Glucose-Capillary: 168 mg/dL — ABNORMAL HIGH (ref 70–99)

## 2013-02-26 LAB — CBC
HCT: 29.2 % — ABNORMAL LOW (ref 36.0–46.0)
Hemoglobin: 9 g/dL — ABNORMAL LOW (ref 12.0–15.0)
MCV: 98 fL (ref 78.0–100.0)
WBC: 8.1 10*3/uL (ref 4.0–10.5)

## 2013-02-26 NOTE — Progress Notes (Signed)
[facility] [place]  Subjective  - Feels ok   Objective 116/66 84 98.6 F (37 C) (Oral) 16 91%  Intake/Output Summary (Last 24 hours) at 02/26/13 0857 Last data filed at 02/26/13 0845  Gross per 24 hour  Intake    960 ml  Output   1700 ml  Net   -740 ml   Right groin VAC in place Wounds right foot slowly healing Right foot pink/warm  Assessment/Planning: Plan for d/c tomorrow.  I will need to take her VAC down and look at the groin one more time before discharge tomorrow Renal function fairly stable creatinine of 2 seems to be new baseline.  Continue to hold her BP meds as recommended by Renal service.  She will need follow up with them after discharge.  [name] E 02/26/2013 8:57 AM --  Laboratory Lab Results:  Recent Labs  02/25/13 0545 02/26/13 0410  WBC 6.7 8.1  HGB 8.8* 9.0*  HCT 27.9* 29.2*  PLT 153 158   BMET  Recent Labs  02/25/13 0545 02/26/13 0430  NA 137 134*  K 5.0 5.1  CL 101 98  CO2 22 24  GLUCOSE 115* 133*  BUN 43* 43*  CREATININE 2.06* 2.00*  CALCIUM 8.6 8.6    COAG Lab Results  Component Value Date   INR 1.09 01/13/2013   INR 1.03 04/16/2012   INR 1.07 05/17/2011   No results found for this basename: PTT

## 2013-02-27 LAB — CBC
HCT: 27.9 % — ABNORMAL LOW (ref 36.0–46.0)
Hemoglobin: 8.8 g/dL — ABNORMAL LOW (ref 12.0–15.0)
MCH: 31.2 pg (ref 26.0–34.0)
MCHC: 31.5 g/dL (ref 30.0–36.0)
Platelets: 129 10*3/uL — ABNORMAL LOW (ref 150–400)
RBC: 2.82 MIL/uL — ABNORMAL LOW (ref 3.87–5.11)
WBC: 6.4 10*3/uL (ref 4.0–10.5)

## 2013-02-27 LAB — RENAL FUNCTION PANEL
Albumin: 2.4 g/dL — ABNORMAL LOW (ref 3.5–5.2)
BUN: 44 mg/dL — ABNORMAL HIGH (ref 6–23)
CO2: 29 mEq/L (ref 19–32)
Chloride: 102 mEq/L (ref 96–112)
GFR calc Af Amer: 27 mL/min — ABNORMAL LOW (ref >90)
GFR calc non Af Amer: 23 mL/min — ABNORMAL LOW (ref >90)
Glucose, Bld: 113 mg/dL — ABNORMAL HIGH (ref 70–99)
Potassium: 4.7 mEq/L (ref 3.5–5.1)
Sodium: 139 mEq/L (ref 135–145)

## 2013-02-27 LAB — GLUCOSE, CAPILLARY
Glucose-Capillary: 103 mg/dL — ABNORMAL HIGH (ref 70–99)
Glucose-Capillary: 133 mg/dL — ABNORMAL HIGH (ref 70–99)

## 2013-02-27 MED ORDER — CIPROFLOXACIN HCL 750 MG PO TABS: 750.0000 mg | ORAL_TABLET | Freq: Every day | ORAL | Status: DC

## 2013-02-27 MED ORDER — TORSEMIDE 20 MG PO TABS: ORAL_TABLET | ORAL | Status: DC

## 2013-02-27 NOTE — Progress Notes (Signed)
ANTIBIOTIC CONSULT NOTE - FOLLOW UP  Pharmacy Consult for Cipro Indication: R groin wound infection  Allergies  Allergen Reactions  . Sulfa Antibiotics Anaphylaxis and Shortness Of Breath  . Codeine Nausea Only and Other (See Comments)    Severe headache  . Ivp Dye [Iodinated Diagnostic Agents] Hives and Other (See Comments)    Severe anxiety  . Penicillins Swelling and Other (See Comments)    "huge sores". Pt has tolerated both Zosyn and Keflex in the past.    Patient Measurements: Height: 5' (152.4 cm) Weight: 165 lb 3.2 oz (74.934 kg) IBW/kg (Calculated) : 45.5 Adjusted Body Weight:   Vital Signs: Temp: 98.6 F (37 C) (10/24 0355) Temp src: Oral (10/24 0355) BP: 116/59 mmHg (10/24 0355) Pulse Rate: 78 (10/24 0355) Intake/Output from previous day: 10/23 0701 - 10/24 0700 In: 1800 [P.O.:1800] Out: 1001 [Urine:1000; Stool:1] Intake/Output from this shift:    Labs:  Recent Labs  02/24/13 1525 02/25/13 0545 02/26/13 0410 02/26/13 0430 02/27/13 0725  WBC  --  6.7 8.1  --  6.4  HGB  --  8.8* 9.0*  --  8.8*  PLT  --  153 158  --  129*  LABCREA 49.91  --   --   --   --   CREATININE  --  2.06*  --  2.00* 2.10*   Estimated Creatinine Clearance: 23.5 ml/min (by C-G formula based on Cr of 2.1). No results found for this basename: VANCOTROUGH, VANCOPEAK, VANCORANDOM, GENTTROUGH, GENTPEAK, GENTRANDOM, TOBRATROUGH, TOBRAPEAK, TOBRARND, AMIKACINPEAK, AMIKACINTROU, AMIKACIN,  in the last 72 hours   Microbiology: Recent Results (from the past 720 hour(s))  WOUND CULTURE     Status: None   Collection Time    02/06/13 11:31 AM      Result Value Range Status   Specimen Description WOUND RIGHT GROIN   Final   Special Requests PT ON VANCOMYCIN   Final   Gram Stain     Final   Value: NO WBC SEEN     NO SQUAMOUS EPITHELIAL CELLS SEEN     MODERATE GRAM NEGATIVE RODS     Performed at [facility]   Culture     Final   Value: MODERATE SERRATIA MARCESCENS   Performed at [facility]   Report Status 02/09/2013 FINAL   Final   Organism ID, Bacteria SERRATIA MARCESCENS   Final  URINE CULTURE     Status: None   Collection Time    02/08/13  3:26 PM      Result Value Range Status   Specimen Description URINE, CLEAN CATCH   Final   Special Requests NONE   Final   Culture  Setup Time     Final   Value: 02/08/2013 23:11     Performed at [facility] Count     Final   Value: NO GROWTH     Performed at [facility]   Culture     Final   Value: NO GROWTH     Performed at [facility]   Report Status 02/10/2013 FINAL   Final  WOUND CULTURE     Status: None   Collection Time    02/11/13  1:07 PM      Result Value Range Status   Specimen Description WOUND RIGHT GROIN   Final   Special Requests     Final   Value: PATIENT ON FOLLOWING ZOSYN RIGHT GROIN WOUND FOR CULTURE   Gram Stain  Final   Value: FEW WBC PRESENT, PREDOMINANTLY PMN     NO SQUAMOUS EPITHELIAL CELLS SEEN     NO ORGANISMS SEEN     Performed at [facility]   Culture     Final   Value: FEW SERRATIA MARCESCENS     Performed at [facility]   Report Status 02/14/2013 FINAL   Final   Organism ID, Bacteria SERRATIA MARCESCENS   Final  SURGICAL PCR SCREEN     Status: None   Collection Time    02/21/13  6:54 AM      Result Value Range Status   MRSA, PCR NEGATIVE  NEGATIVE Final   Staphylococcus aureus NEGATIVE  NEGATIVE Final   Comment:            The Xpert SA Assay (FDA     approved for NASAL specimens     in patients over 10 years of age),     is one component of     a comprehensive surveillance     program.  Test performance has     been validated by [employer] for patients greater     than or equal to 3 year old.     It is not intended     to diagnose infection nor to     guide or monitor treatment.    Anti-infectives   Start     Dose/Rate Route Frequency Ordered Stop   02/18/13 0700  ciprofloxacin  (CIPRO) tablet 750 mg     750 mg Oral Daily with breakfast 02/17/13 0842     02/18/13 0000  ciprofloxacin (CIPRO) 750 MG tablet     750 mg Oral Daily with breakfast 02/18/13 1516     02/13/13 1200  piperacillin-tazobactam (ZOSYN) IVPB 3.375 g  Status:  Discontinued     3.375 g 12.5 mL/hr over 240 Minutes Intravenous Every 8 hours 02/13/13 1037 02/17/13 0842   02/11/13 0600  vancomycin (VANCOCIN) IVPB 1000 mg/200 mL premix  Status:  Discontinued     1,000 mg 200 mL/hr over 60 Minutes Intravenous Every 48 hours 02/10/13 1048 02/12/13 0818   02/08/13 1330  vancomycin (VANCOCIN) IVPB 1000 mg/200 mL premix     1,000 mg 200 mL/hr over 60 Minutes Intravenous  Once 02/08/13 1309 02/08/13 1600   02/07/13 1200  piperacillin-tazobactam (ZOSYN) IVPB 2.25 g  Status:  Discontinued     2.25 g 100 mL/hr over 30 Minutes Intravenous Every 8 hours 02/07/13 0304 02/13/13 1037   02/07/13 1000  vancomycin (VANCOCIN) IVPB 1000 mg/200 mL premix  Status:  Discontinued     1,000 mg 200 mL/hr over 60 Minutes Intravenous Every 24 hours 02/06/13 1801 02/07/13 0303   02/06/13 1830  piperacillin-tazobactam (ZOSYN) IVPB 3.375 g  Status:  Discontinued     3.375 g 12.5 mL/hr over 240 Minutes Intravenous Every 8 hours 02/06/13 1759 02/07/13 0304   02/05/13 1422  vancomycin (VANCOCIN) IVPB 1000 mg/200 mL premix     1,000 mg 200 mL/hr over 60 Minutes Intravenous 60 min pre-op 02/05/13 1422 02/06/13 1050      Assessment: Infectious Disease - groin abscess, s/p Zosyn x 10 days. S/p OR debridement 10/3, 10/8, 10/9 (Evac of right groin hematoma), 10/20. Marland Kitchen De-esc to cipro po 750 mg daily dose ok for current renal fx. Tmax 99. WBC 6.4. Scr 2.1 stable with CrCl 10-25  Vanc 10/3 >>10/9 10/5 Random Vanc 9.8 10/7 Random Vanc 20.6 Zosyn 10/3 >> 10/14 Cipro  PO 10/14 >>   10/3 R groin abscess >> Serratia marcescens - Sens Cefepime/Ceftaz/Roceph/Cipro/Gent/Tobra/Bact Resis Ancef/Cefoxitin 10/5 Urine>>neg 10/8 R groin  abscess>>Serratia marcescens - identical sensitivities    Plan:  Cipro 750mg  po q24h dose ok for stable renal function. Pharmacy will sign off. Please reconsult for further dosing assistance.   Karem Farha S. Merilynn Finland, PharmD, BCPS Clinical Staff Pharmacist Pager [phone]  Misty Stanley Stillinger 02/27/2013,9:04 AM

## 2013-02-27 NOTE — Progress Notes (Addendum)
[facility] [place]  Subjective  -" I'm ready to go home."   Objective 116/59 78 98.6 F (37 C) (Oral) 18 94%  Intake/Output Summary (Last 24 hours) at 02/27/13 0928 Last data filed at 02/26/13 2200  Gross per 24 hour  Intake   1320 ml  Output   1001 ml  Net    319 ml   Right groin VAC in place removed to reveal beefy red base with small areas of yellow eschar. Wounds right foot slowly healing  Right foot pink/warm    Assessment/Planning: Discharge home today Wound vac changes per home health Cipro for 2 weeks f/u in office in 1 week f/u appt with Dr. Sabra Heck at [facility] on 03/12/13 at 1:15 PM .   Thomasena Edis [facility] 02/27/2013 9:28 AM --  Laboratory Lab Results:  Recent Labs  02/26/13 0410 02/27/13 0725  WBC 8.1 6.4  HGB 9.0* 8.8*  HCT 29.2* 27.9*  PLT 158 129*   BMET  Recent Labs  02/26/13 0430 02/27/13 0725  NA 134* 139  K 5.1 4.7  CL 98 102  CO2 24 29  GLUCOSE 133* 113*  BUN 43* 44*  CREATININE 2.00* 2.10*  CALCIUM 8.6 8.5    COAG Lab Results  Component Value Date   INR 1.09 01/13/2013   INR 1.03 04/16/2012   INR 1.07 05/17/2011   No results found for this basename: PTT    Agree with above.  Keep off ARB. Will see next Thursday. Continue VAC  Fabienne Bruns, MD [facility] [place] Office: [phone] Pager: [phone]

## 2013-02-27 NOTE — Progress Notes (Signed)
PT Cancellation Note  Patient Details Name: [name] MRN: [id number] DOB: 1945/11/23   Cancelled Treatment:     Noted pt to d/c home today. Offered to practice steps again (pt last practiced 10/17 prior to recent surgery--was able to do with min assist). Pt immediately stated she does not need to do anything with therapy today and wants to conserve her energy for going home. States she feels comfortable with her family's ability to assist her and reminded PT she's been using a cane or RW since 2007.    Carrell Rahmani 02/27/2013, 9:53 AM Pager [phone]

## 2013-02-27 NOTE — Progress Notes (Signed)
Chaplain observed that pt has been in hospital for 3 weeks and initiated visit. Noted and pt confirmed that she is waiting for d/c today. Pt very "ready" and excited to go home. Pt was talking on her cell phone. Chaplain offered support, but pt said she "was just fine."   Maurene Capes, [place] [phone]

## 2013-02-27 NOTE — Discharge Summary (Signed)
[facility] Discharge Summary   Patient ID:  [name] MRN: [id number] DOB/AGE: [birth year] 67 y.o.  Admit date: 02/06/2013 Discharge date: 02/27/2013 Date of Surgery: 02/06/2013 - 02/23/2013 Surgeon: Surgeon(s): Sherren Kerns, MD  Admission Diagnosis: nonhealing wound right groin groin wound Bleeding NONHEALING WOUND RIGHT GROIN  Discharge Diagnoses:  nonhealing wound right groin groin wound Bleeding NONHEALING WOUND RIGHT GROIN  Secondary Diagnoses: Past Medical History  Diagnosis Date  . Coronary artery disease     a. s/p CABG 2007 (L-RI, S-dRCA, S-LAD, S-OM);  b. myoview 6/13:  anteroapical MI, no ischemia, EF 31%  . Diabetes mellitus   . Hypertension   . Asthma   . Bronchitis   . Reflux   . Hyperlipidemia   . Chronic systolic heart failure     a. echo 6/13: mild LVH, EF 15-20%, diff HK, Gr 1 DD, mild reduced RVSF, PASP 32.  Marland Kitchen GERD (gastroesophageal reflux disease)   . Arthritis   . CKD (chronic kidney disease)   . Chronic back pain   . Hypoxia     a. Adm 09/2012 - discharged with home O2 in setting of CHF.  Marland Kitchen Abnormal stress test     a. Nuc 10/2011: bright target which may be in left breast or L chest wall or axilla. Pt reports she f/u with PCP and mammogram was OK (left breast cyst).  . Complication of anesthesia     slow to awaken  . 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 Anginal pain     "rarely, had some 2 months ago, not sure if heart or arm, cardiologist aware.  . CHF (congestive heart failure)   . Shortness of breath     at times- sensitive to perfume.  . Peripheral vascular disease   . Depression   . Stroke 05/2011    a. R ischemic CVA 05/2011. Right hand weakness.  . Pneumonia     2010ish  . Neuropathic pain     feet and hands  . Polio     as a child  . Altered level of consciousness     2 weeks now    Procedure(s): IRRIGATION AND DEBRIDEMENT RIGHT GROIN APPLICATION OF WOUND VAC  Discharged Condition:  good  HPI: 67 year old female who recently underwent right femoral to below-knee popliteal bypass with propaten and a vein patch distally. This was done on September 16. She was discharged from the hospital 10 days ago. She began complaining of swelling in her right groin and some redness last week. The below-knee incision has stopped draining at this point. She presented to the office today for further followup. She states overall swelling in her leg is improved. She is having some drainage from her right groin.   On 02/06/2013 she was taken to the OR for I & D and wound vac placement.  She was very confused and needed restraint orders.  Zosyn and Vancomycin was started and adjusted to renal function.  She was placed on Lovenox for DVT prophylaxis.  PO intake was poor and Kangaroo NG feeding tube was placed to help with nutritional needs to heal the wound.  Nephrology was consulted secondary to Acute renal Failure, HTN/Volume, mild Metabolic Acidosis.    She developed Hypoxia, Tachypnea: Will treat for COPD with scheduled A/A nebs and prn albuterol as well. pCXR suggests some edema and have given lasix 80 IV BID, LR stopped. On broad coverage ABX.  Poorly healing groin wound, she was taken  back to the OR for debridement right groin and placement of wound vac.  Post-op bleeding developed that was not controlled with pressure of hemostatic agents.  Dr. Leonides Sake took her back to the  OR.  Right groin irrigation and mechanical debridement, Evacuation of right groin hematoma, and Placement negative pressure dressing to right groin wound.  Her pulses in the right LE are palpable and ABIs are 0.97 bilaterally.  Her confusion subsided and she was able to take POs well.  The NG feeding tube was discharged and she was transferred to 2000.  Her cultures grew Serratia.  Antibiotics were continued.  Nephrology was re-consulted 02/20/2013 secondary to hyperkalemia.  Asked to come back to see patient because of the  development of hyperkalemia  I last saw patient on 10/13 and at time of sign off - creatinine was 2.12, K 5.0  In interim time was started on losartan (not sure why) which was discontinued yesterday  Has been on Ensure supplements and non potassium restricted diet  Potassium yesterday 5.7, today up to 6.2  Kayexelate has been ordered  Return to OR 02/23/2013 I & D right groin wound.  Post-op we continued to restrict K in diet and would not resume ACE/ARB given h/o hyperkalemia. This will be the discharge plan as well.  After wound vac change today she will be discharged home.  Follow up visits with Dr. Darrick Penna in 2 weeks and Dr. Sabra Heck nephrology on 03/12/2013.   Hospital Course:  [name] is a 67 y.o. female is S/P Right Procedure(s): IRRIGATION AND DEBRIDEMENT RIGHT GROIN APPLICATION OF WOUND VAC Extubated: POD # 0 Physical exam: Right groin VAC in place  Wounds right foot slowly healing  Right foot pink/warm Post-op wounds healing well with beefy red base and small areas of yellow eschar Pt. Ambulating, voiding and taking PO diet without difficulty. Pt pain controlled with PO pain meds. Labs as below Complications:see hospital course  Consults: see hospital course    Significant Diagnostic Studies: CBC Lab Results  Component Value Date   WBC 8.1 02/26/2013   HGB 9.0* 02/26/2013   HCT 29.2* 02/26/2013   MCV 98.0 02/26/2013   PLT 158 02/26/2013    BMET    Component Value Date/Time   NA 134* 02/26/2013 0430   K 5.1 02/26/2013 0430   CL 98 02/26/2013 0430   CO2 24 02/26/2013 0430   GLUCOSE 133* 02/26/2013 0430   BUN 43* 02/26/2013 0430   CREATININE 2.00* 02/26/2013 0430   CALCIUM 8.6 02/26/2013 0430   GFRNONAA 25* 02/26/2013 0430   GFRAA 29* 02/26/2013 0430   COAG Lab Results  Component Value Date   INR 1.09 01/13/2013   INR 1.03 04/16/2012   INR 1.07 05/17/2011     Disposition:  Discharge to :Home Discharge Orders   Future Appointments Provider  Department Dept Phone   03/05/2013 11:15 AM Sherren Kerns, MD [facility] [phone]   Future Orders Complete By Expires   Call MD for:  redness, tenderness, or signs of infection (pain, swelling, bleeding, redness, odor or green/yellow discharge around incision site)  As directed    Call MD for:  severe or increased pain, loss or decreased feeling  in affected limb(s)  As directed    Call MD for:  temperature >100.5  As directed    Driving Restrictions  As directed    Comments:     No driving for 6 weeks   Increase activity slowly  As directed    Comments:     Walk with assistance use walker or cane as needed   Lifting restrictions  As directed    Comments:     No lifting for 8 weeks   Resume previous diet  As directed        Medication List    STOP taking these medications       cephALEXin 500 MG capsule  Commonly known as:  KEFLEX     clopidogrel 75 MG tablet  Commonly known as:  PLAVIX     losartan 50 MG tablet  Commonly known as:  COZAAR     potassium chloride 10 MEQ tablet  Commonly known as:  K-DUR     spironolactone 25 MG tablet  Commonly known as:  ALDACTONE      TAKE these medications       albuterol 108 (90 BASE) MCG/ACT inhaler  Commonly known as:  PROVENTIL HFA;VENTOLIN HFA  Inhale 2 puffs into the lungs every 6 (six) hours as needed for wheezing or shortness of breath.     ALPRAZolam 0.5 MG tablet  Commonly known as:  XANAX  Take 0.5 mg by mouth daily as needed for anxiety.     budesonide-formoterol 160-4.5 MCG/ACT inhaler  Commonly known as:  SYMBICORT  Inhale 2 puffs into the lungs 2 (two) times daily.     carvedilol 12.5 MG tablet  Commonly known as:  COREG  Take 12.5 mg by mouth 2 (two) times daily with a meal.     ciprofloxacin 750 MG tablet  Commonly known as:  CIPRO  Take 1 tablet (750 mg total) by mouth daily with breakfast.     diphenhydrAMINE 25 mg capsule  Commonly known as:  BENADRYL  Take 25  mg by mouth every 6 (six) hours as needed for allergies.     DULoxetine 60 MG capsule  Commonly known as:  CYMBALTA  Take 60 mg by mouth daily.     HYDROcodone-acetaminophen 5-325 MG per tablet  Commonly known as:  NORCO  Take 1-2 tablets every 6 hrs. / prn / pain     insulin glargine 100 UNIT/ML injection  Commonly known as:  LANTUS  Inject 28 Units into the skin every morning.     insulin lispro 100 UNIT/ML injection  Commonly known as:  HUMALOG  Inject 6-11 Units into the skin 3 (three) times daily before meals. Per sliding scale     nitroGLYCERIN 0.4 MG SL tablet  Commonly known as:  NITROSTAT  Place 0.4 mg under the tongue every 5 (five) minutes as needed for chest pain.     pantoprazole 40 MG tablet  Commonly known as:  PROTONIX  Take 40 mg by mouth daily.     pregabalin 50 MG capsule  Commonly known as:  LYRICA  Take 50 mg by mouth 2 (two) times daily.     rosuvastatin 40 MG tablet  Commonly known as:  CRESTOR  Take 40 mg by mouth daily.     torsemide 20 MG tablet  Commonly known as:  DEMADEX  2 tab daily by mouth       Verbal and written Discharge instructions given to the patient. Wound care per Discharge AVS     Follow-up Information   Follow up with Sherren Kerns, MD In 2 weeks. (office will arrange-sent)    Specialty:  Vascular Surgery   Contact information:   [address] [place] [address] [phone]  Follow up with Arita Miss, MD. (03/12/2013 at 1:15)    Specialty:  Nephrology   Contact information:   [address] [place] [address] [phone]       Signed: [name] [facility] 02/27/2013, 7:48 AM

## 2013-03-04 ENCOUNTER — Encounter: Payer: Self-pay | Admitting: Vascular Surgery

## 2013-03-05 ENCOUNTER — Ambulatory Visit (INDEPENDENT_AMBULATORY_CARE_PROVIDER_SITE_OTHER): Payer: Self-pay | Admitting: Vascular Surgery

## 2013-03-05 ENCOUNTER — Encounter: Payer: Self-pay | Admitting: Vascular Surgery

## 2013-03-05 VITALS — BP 148/66 | HR 83 | Temp 98.1°F | Ht 60.0 in | Wt 165.0 lb

## 2013-03-05 DIAGNOSIS — Z5189 Encounter for other specified aftercare: Secondary | ICD-10-CM

## 2013-03-05 DIAGNOSIS — I739 Peripheral vascular disease, unspecified: Secondary | ICD-10-CM

## 2013-03-05 DIAGNOSIS — S31109D Unspecified open wound of abdominal wall, unspecified quadrant without penetration into peritoneal cavity, subsequent encounter: Secondary | ICD-10-CM

## 2013-03-05 DIAGNOSIS — S31109A Unspecified open wound of abdominal wall, unspecified quadrant without penetration into peritoneal cavity, initial encounter: Secondary | ICD-10-CM | POA: Insufficient documentation

## 2013-03-05 NOTE — Progress Notes (Signed)
Patient is a 67 year old female who is approximately one month status post right femoral to below-knee popliteal bypass with prosthetic and a vein cuff. She developed complications with wound healing in her right groin. She was recently discharged from the hospital after multiple debridements of her right groin. She currently has a back in her right groin. She denies any fever or chills.  Physical exam:  Filed Vitals:   03/05/13 1139  BP: 148/66  Pulse: 83  Temp: 98.1 F (36.7 C)  TempSrc: Oral  Height: 5' (1.524 m)  Weight: 165 lb (74.844 kg)  SpO2: 100%    Right groin 5 x 7 cm open wound 2-3 cm in depth red beefy healthy-appearing granulation tissue no exposed graft Right foot pink warm and well-perfused, ulcerations on the foot have healed completely  Assessment: Patent right lower extremity bypass right groin wound slowly healing currently no evidence of graft infection  Plan: Continue VAC followup 2 weeks  Fabienne Bruns, MD [facility] Office: [phone] Pager: [phone]

## 2013-03-17 ENCOUNTER — Encounter (HOSPITAL_COMMUNITY): Payer: Self-pay | Admitting: Vascular Surgery

## 2013-03-17 NOTE — OR Nursing (Signed)
LATE ENTRY: Procedure end time documented into incorrect field.  Re-entered procedure end time into correct field; Procedure End.

## 2013-03-18 ENCOUNTER — Encounter: Payer: Self-pay | Admitting: Vascular Surgery

## 2013-03-19 ENCOUNTER — Ambulatory Visit (INDEPENDENT_AMBULATORY_CARE_PROVIDER_SITE_OTHER): Payer: Self-pay | Admitting: Vascular Surgery

## 2013-03-19 ENCOUNTER — Encounter: Payer: Self-pay | Admitting: Vascular Surgery

## 2013-03-19 VITALS — BP 155/79 | HR 87 | Temp 98.2°F | Ht 60.0 in | Wt 150.0 lb

## 2013-03-19 DIAGNOSIS — I739 Peripheral vascular disease, unspecified: Secondary | ICD-10-CM

## 2013-03-19 DIAGNOSIS — S31109D Unspecified open wound of abdominal wall, unspecified quadrant without penetration into peritoneal cavity, subsequent encounter: Secondary | ICD-10-CM

## 2013-03-19 MED ORDER — CIPROFLOXACIN HCL 750 MG PO TABS: 750.0000 mg | ORAL_TABLET | Freq: Every day | ORAL | Status: DC

## 2013-03-19 NOTE — Progress Notes (Signed)
Patient is a 67 year old female who is approximately 6 weeks status post right femoral to below-knee popliteal bypass with prosthetic and a vein cuff. She developed complications with wound healing in her right groin. She was recently discharged from the hospital after multiple debridements of her right groin. She currently has a back in her right groin. She denies any fever or chills. 5 x 7 cm open wound 2-3 cm last visit.  Physical exam:    Filed Vitals:   03/19/13 0945  BP: 155/79  Pulse: 87  Temp: 98.2 F (36.8 C)  TempSrc: Oral  Height: 5' (1.524 m)  Weight: 150 lb (68.04 kg)  SpO2: 100%    Right groin  in depth red beefy healthy-appearing granulation tissue no exposed graft wound today 5 x 4 x 2 beefy red granulation Right foot pink warm and well-perfused, ulcerations on the foot have healed completely  Assessment: Patent right lower extremity bypass right groin wound slowly healing currently no evidence of graft infection  Plan: Continue VAC followup 4 weeks  Fabienne Bruns, MD [facility] Office: [phone] Pager: [phone]

## 2013-03-24 ENCOUNTER — Telehealth: Payer: Self-pay | Admitting: *Deleted

## 2013-03-24 NOTE — Telephone Encounter (Signed)
Rosalita Chessman, [facility] nurse called requesting verbal order to continue providing home skilled nursing visits. Order was given

## 2013-04-22 ENCOUNTER — Encounter: Payer: Self-pay | Admitting: Vascular Surgery

## 2013-04-23 ENCOUNTER — Encounter: Payer: Self-pay | Admitting: Vascular Surgery

## 2013-04-23 ENCOUNTER — Ambulatory Visit (INDEPENDENT_AMBULATORY_CARE_PROVIDER_SITE_OTHER): Payer: Self-pay | Admitting: Vascular Surgery

## 2013-04-23 VITALS — BP 154/88 | HR 99 | Ht 60.0 in | Wt 157.1 lb

## 2013-04-23 DIAGNOSIS — Z5189 Encounter for other specified aftercare: Secondary | ICD-10-CM

## 2013-04-23 DIAGNOSIS — S31109D Unspecified open wound of abdominal wall, unspecified quadrant without penetration into peritoneal cavity, subsequent encounter: Secondary | ICD-10-CM

## 2013-04-23 DIAGNOSIS — I739 Peripheral vascular disease, unspecified: Secondary | ICD-10-CM

## 2013-04-23 NOTE — Progress Notes (Signed)
Patient is a 67 year old female who is approximately 10 weeks status post right femoral to below-knee popliteal bypass with prosthetic and a vein cuff. She developed complications with wound healing in her right groin. She was recently discharged from the hospital after multiple debridements of her right groin. She currently has a back in her right groin. She denies any fever or chills. 5 x 4 cm open wound 2-3 cm last visit.  Physical exam:    Filed Vitals:   04/23/13 0959  BP: 154/88  Pulse: 99  Height: 5' (1.524 m)  Weight: 157 lb 1.6 oz (71.26 kg)  SpO2: 100%    Right groin  and 2 mm depth red beefy healthy-appearing granulation tissue no exposed graft wound today 3 x 3 beefy red granulation Right foot pink warm and well-perfused, ulcerations on the foot have healed completely, 2+ DP pulse  Assessment: Patent right lower extremity bypass right groin wound slowly healing currently no evidence of graft infection  Plan: Which wound care to hydrogel once daily. Patient will followup in one month.  Fabienne Bruns, MD [facility] Office: [phone] Pager: [phone]

## 2013-04-24 ENCOUNTER — Telehealth: Payer: Self-pay | Admitting: *Deleted

## 2013-04-24 NOTE — Telephone Encounter (Signed)
Rosalita Chessman RN from [facility] called inquiring of new wound care orders.  Dr Darrick Penna had discontinued the wound vac and ordered Hydrogel daily to groin wound.  Home health to check once weekly times two weeks.  Rosalita Chessman voiced understanding of the new orders.

## 2013-06-03 ENCOUNTER — Encounter: Payer: Self-pay | Admitting: Vascular Surgery

## 2013-06-04 ENCOUNTER — Ambulatory Visit: Payer: Medicare Other | Admitting: Vascular Surgery

## 2013-06-04 ENCOUNTER — Inpatient Hospital Stay (HOSPITAL_COMMUNITY): Admission: RE | Admit: 2013-06-04 | Payer: Medicare Other | Source: Ambulatory Visit

## 2013-06-07 DIAGNOSIS — L0390 Cellulitis, unspecified: Secondary | ICD-10-CM

## 2013-06-07 HISTORY — DX: Cellulitis, unspecified: L03.90

## 2013-06-15 ENCOUNTER — Other Ambulatory Visit: Payer: Self-pay | Admitting: Family Medicine

## 2013-06-15 DIAGNOSIS — R609 Edema, unspecified: Secondary | ICD-10-CM

## 2013-06-16 ENCOUNTER — Other Ambulatory Visit: Payer: Self-pay | Admitting: Family Medicine

## 2013-06-16 ENCOUNTER — Ambulatory Visit
Admission: RE | Admit: 2013-06-16 | Discharge: 2013-06-16 | Disposition: A | Discharge status: Home / Self Care | Payer: Medicare Other | Source: Ambulatory Visit | Attending: Family Medicine | Admitting: Family Medicine

## 2013-06-16 ENCOUNTER — Encounter (HOSPITAL_COMMUNITY): Payer: Self-pay | Admitting: Emergency Medicine

## 2013-06-16 ENCOUNTER — Inpatient Hospital Stay (HOSPITAL_COMMUNITY)
Admission: EM | Admit: 2013-06-16 | Discharge: 2013-07-02 | DRG: 981 | Disposition: A | Discharge status: Hospice | Payer: Medicare Other | Attending: Internal Medicine | Admitting: Internal Medicine

## 2013-06-16 DIAGNOSIS — J9600 Acute respiratory failure, unspecified whether with hypoxia or hypercapnia: Secondary | ICD-10-CM | POA: Diagnosis not present

## 2013-06-16 DIAGNOSIS — Z8249 Family history of ischemic heart disease and other diseases of the circulatory system: Secondary | ICD-10-CM

## 2013-06-16 DIAGNOSIS — A498 Other bacterial infections of unspecified site: Secondary | ICD-10-CM | POA: Diagnosis present

## 2013-06-16 DIAGNOSIS — I10 Essential (primary) hypertension: Secondary | ICD-10-CM | POA: Diagnosis present

## 2013-06-16 DIAGNOSIS — I639 Cerebral infarction, unspecified: Secondary | ICD-10-CM

## 2013-06-16 DIAGNOSIS — Z885 Allergy status to narcotic agent status: Secondary | ICD-10-CM

## 2013-06-16 DIAGNOSIS — I428 Other cardiomyopathies: Secondary | ICD-10-CM | POA: Diagnosis present

## 2013-06-16 DIAGNOSIS — M7989 Other specified soft tissue disorders: Secondary | ICD-10-CM

## 2013-06-16 DIAGNOSIS — E1169 Type 2 diabetes mellitus with other specified complication: Secondary | ICD-10-CM | POA: Diagnosis present

## 2013-06-16 DIAGNOSIS — I5043 Acute on chronic combined systolic (congestive) and diastolic (congestive) heart failure: Secondary | ICD-10-CM

## 2013-06-16 DIAGNOSIS — E785 Hyperlipidemia, unspecified: Secondary | ICD-10-CM | POA: Diagnosis present

## 2013-06-16 DIAGNOSIS — N184 Chronic kidney disease, stage 4 (severe): Secondary | ICD-10-CM

## 2013-06-16 DIAGNOSIS — N189 Chronic kidney disease, unspecified: Secondary | ICD-10-CM

## 2013-06-16 DIAGNOSIS — Z8673 Personal history of transient ischemic attack (TIA), and cerebral infarction without residual deficits: Secondary | ICD-10-CM

## 2013-06-16 DIAGNOSIS — G589 Mononeuropathy, unspecified: Secondary | ICD-10-CM | POA: Diagnosis present

## 2013-06-16 DIAGNOSIS — D696 Thrombocytopenia, unspecified: Secondary | ICD-10-CM | POA: Diagnosis not present

## 2013-06-16 DIAGNOSIS — Z823 Family history of stroke: Secondary | ICD-10-CM

## 2013-06-16 DIAGNOSIS — I498 Other specified cardiac arrhythmias: Secondary | ICD-10-CM | POA: Diagnosis not present

## 2013-06-16 DIAGNOSIS — N039 Chronic nephritic syndrome with unspecified morphologic changes: Secondary | ICD-10-CM

## 2013-06-16 DIAGNOSIS — E876 Hypokalemia: Secondary | ICD-10-CM | POA: Diagnosis not present

## 2013-06-16 DIAGNOSIS — K219 Gastro-esophageal reflux disease without esophagitis: Secondary | ICD-10-CM | POA: Diagnosis present

## 2013-06-16 DIAGNOSIS — F17200 Nicotine dependence, unspecified, uncomplicated: Secondary | ICD-10-CM | POA: Diagnosis present

## 2013-06-16 DIAGNOSIS — Z720 Tobacco use: Secondary | ICD-10-CM

## 2013-06-16 DIAGNOSIS — B353 Tinea pedis: Secondary | ICD-10-CM | POA: Diagnosis present

## 2013-06-16 DIAGNOSIS — L02419 Cutaneous abscess of limb, unspecified: Principal | ICD-10-CM | POA: Diagnosis present

## 2013-06-16 DIAGNOSIS — I5022 Chronic systolic (congestive) heart failure: Secondary | ICD-10-CM | POA: Diagnosis present

## 2013-06-16 DIAGNOSIS — I2510 Atherosclerotic heart disease of native coronary artery without angina pectoris: Secondary | ICD-10-CM | POA: Diagnosis present

## 2013-06-16 DIAGNOSIS — Z91041 Radiographic dye allergy status: Secondary | ICD-10-CM

## 2013-06-16 DIAGNOSIS — S31109A Unspecified open wound of abdominal wall, unspecified quadrant without penetration into peritoneal cavity, initial encounter: Secondary | ICD-10-CM

## 2013-06-16 DIAGNOSIS — F411 Generalized anxiety disorder: Secondary | ICD-10-CM | POA: Diagnosis not present

## 2013-06-16 DIAGNOSIS — I739 Peripheral vascular disease, unspecified: Secondary | ICD-10-CM | POA: Diagnosis present

## 2013-06-16 DIAGNOSIS — F329 Major depressive disorder, single episode, unspecified: Secondary | ICD-10-CM | POA: Diagnosis present

## 2013-06-16 DIAGNOSIS — I272 Pulmonary hypertension, unspecified: Secondary | ICD-10-CM

## 2013-06-16 DIAGNOSIS — L0291 Cutaneous abscess, unspecified: Secondary | ICD-10-CM

## 2013-06-16 DIAGNOSIS — Z951 Presence of aortocoronary bypass graft: Secondary | ICD-10-CM

## 2013-06-16 DIAGNOSIS — F3289 Other specified depressive episodes: Secondary | ICD-10-CM | POA: Diagnosis present

## 2013-06-16 DIAGNOSIS — M129 Arthropathy, unspecified: Secondary | ICD-10-CM | POA: Diagnosis present

## 2013-06-16 DIAGNOSIS — R531 Weakness: Secondary | ICD-10-CM

## 2013-06-16 DIAGNOSIS — Z96649 Presence of unspecified artificial hip joint: Secondary | ICD-10-CM

## 2013-06-16 DIAGNOSIS — Z794 Long term (current) use of insulin: Secondary | ICD-10-CM

## 2013-06-16 DIAGNOSIS — M79609 Pain in unspecified limb: Secondary | ICD-10-CM

## 2013-06-16 DIAGNOSIS — D631 Anemia in chronic kidney disease: Secondary | ICD-10-CM | POA: Diagnosis present

## 2013-06-16 DIAGNOSIS — E871 Hypo-osmolality and hyponatremia: Secondary | ICD-10-CM | POA: Diagnosis not present

## 2013-06-16 DIAGNOSIS — N2581 Secondary hyperparathyroidism of renal origin: Secondary | ICD-10-CM | POA: Diagnosis present

## 2013-06-16 DIAGNOSIS — IMO0002 Reserved for concepts with insufficient information to code with codable children: Secondary | ICD-10-CM

## 2013-06-16 DIAGNOSIS — J449 Chronic obstructive pulmonary disease, unspecified: Secondary | ICD-10-CM | POA: Diagnosis present

## 2013-06-16 DIAGNOSIS — Z8612 Personal history of poliomyelitis: Secondary | ICD-10-CM

## 2013-06-16 DIAGNOSIS — Z66 Do not resuscitate: Secondary | ICD-10-CM | POA: Diagnosis not present

## 2013-06-16 DIAGNOSIS — Z880 Allergy status to penicillin: Secondary | ICD-10-CM

## 2013-06-16 DIAGNOSIS — I872 Venous insufficiency (chronic) (peripheral): Secondary | ICD-10-CM | POA: Diagnosis present

## 2013-06-16 DIAGNOSIS — G8929 Other chronic pain: Secondary | ICD-10-CM | POA: Diagnosis present

## 2013-06-16 DIAGNOSIS — N390 Urinary tract infection, site not specified: Secondary | ICD-10-CM | POA: Diagnosis present

## 2013-06-16 DIAGNOSIS — M949 Disorder of cartilage, unspecified: Secondary | ICD-10-CM

## 2013-06-16 DIAGNOSIS — R627 Adult failure to thrive: Secondary | ICD-10-CM | POA: Diagnosis present

## 2013-06-16 DIAGNOSIS — E113299 Type 2 diabetes mellitus with mild nonproliferative diabetic retinopathy without macular edema, unspecified eye: Secondary | ICD-10-CM

## 2013-06-16 DIAGNOSIS — L03119 Cellulitis of unspecified part of limb: Principal | ICD-10-CM

## 2013-06-16 DIAGNOSIS — Z7902 Long term (current) use of antithrombotics/antiplatelets: Secondary | ICD-10-CM

## 2013-06-16 DIAGNOSIS — L0390 Cellulitis, unspecified: Secondary | ICD-10-CM

## 2013-06-16 DIAGNOSIS — Z515 Encounter for palliative care: Secondary | ICD-10-CM

## 2013-06-16 DIAGNOSIS — Z881 Allergy status to other antibiotic agents status: Secondary | ICD-10-CM

## 2013-06-16 DIAGNOSIS — Z79899 Other long term (current) drug therapy: Secondary | ICD-10-CM

## 2013-06-16 DIAGNOSIS — N179 Acute kidney failure, unspecified: Secondary | ICD-10-CM | POA: Diagnosis present

## 2013-06-16 DIAGNOSIS — M899 Disorder of bone, unspecified: Secondary | ICD-10-CM | POA: Diagnosis present

## 2013-06-16 DIAGNOSIS — J4489 Other specified chronic obstructive pulmonary disease: Secondary | ICD-10-CM | POA: Diagnosis present

## 2013-06-16 DIAGNOSIS — I2789 Other specified pulmonary heart diseases: Secondary | ICD-10-CM | POA: Diagnosis present

## 2013-06-16 DIAGNOSIS — R609 Edema, unspecified: Secondary | ICD-10-CM

## 2013-06-16 DIAGNOSIS — M549 Dorsalgia, unspecified: Secondary | ICD-10-CM | POA: Diagnosis present

## 2013-06-16 DIAGNOSIS — I5021 Acute systolic (congestive) heart failure: Secondary | ICD-10-CM

## 2013-06-16 DIAGNOSIS — I959 Hypotension, unspecified: Secondary | ICD-10-CM | POA: Diagnosis not present

## 2013-06-16 DIAGNOSIS — I509 Heart failure, unspecified: Secondary | ICD-10-CM | POA: Diagnosis present

## 2013-06-16 DIAGNOSIS — N186 End stage renal disease: Secondary | ICD-10-CM | POA: Diagnosis present

## 2013-06-16 DIAGNOSIS — I120 Hypertensive chronic kidney disease with stage 5 chronic kidney disease or end stage renal disease: Secondary | ICD-10-CM | POA: Diagnosis present

## 2013-06-16 DIAGNOSIS — I252 Old myocardial infarction: Secondary | ICD-10-CM

## 2013-06-16 HISTORY — DX: Cellulitis, unspecified: L03.90

## 2013-06-16 LAB — CBC WITH DIFFERENTIAL/PLATELET
BASOS ABS: 0 10*3/uL (ref 0.0–0.1)
Basophils Absolute: 0 10*3/uL (ref 0.0–0.1)
Basophils Relative: 0 % (ref 0–1)
Basophils Relative: 1 % (ref 0–1)
EOS ABS: 0.2 10*3/uL (ref 0.0–0.7)
EOS PCT: 2 % (ref 0–5)
Eosinophils Absolute: 0.2 10*3/uL (ref 0.0–0.7)
Eosinophils Relative: 2 % (ref 0–5)
HCT: 35.4 % — ABNORMAL LOW (ref 36.0–46.0)
HEMATOCRIT: 35.2 % — AB (ref 36.0–46.0)
Hemoglobin: 11.9 g/dL — ABNORMAL LOW (ref 12.0–15.0)
Hemoglobin: 11.8 g/dL — ABNORMAL LOW (ref 12.0–15.0)
LYMPHS ABS: 1.6 10*3/uL (ref 0.7–4.0)
LYMPHS PCT: 20 % (ref 12–46)
Lymphocytes Relative: 24 % (ref 12–46)
Lymphs Abs: 1.6 10*3/uL (ref 0.7–4.0)
MCH: 27.7 pg (ref 26.0–34.0)
MCH: 27.1 pg (ref 26.0–34.0)
MCHC: 33.8 g/dL (ref 30.0–36.0)
MCHC: 33.3 g/dL (ref 30.0–36.0)
MCV: 82.1 fL (ref 78.0–100.0)
MCV: 81.4 fL (ref 78.0–100.0)
MONO ABS: 0.7 10*3/uL (ref 0.1–1.0)
Monocytes Absolute: 0.4 10*3/uL (ref 0.1–1.0)
Monocytes Relative: 9 % (ref 3–12)
Monocytes Relative: 7 % (ref 3–12)
Neutro Abs: 5.2 10*3/uL (ref 1.7–7.7)
Neutro Abs: 4.4 10*3/uL (ref 1.7–7.7)
Neutrophils Relative %: 69 % (ref 43–77)
Neutrophils Relative %: 67 % (ref 43–77)
PLATELETS: 136 10*3/uL — AB (ref 150–400)
Platelets: 127 10*3/uL — ABNORMAL LOW (ref 150–400)
RBC: 4.29 MIL/uL (ref 3.87–5.11)
RBC: 4.35 MIL/uL (ref 3.87–5.11)
RDW: 15.8 % — ABNORMAL HIGH (ref 11.5–15.5)
RDW: 15.6 % — AB (ref 11.5–15.5)
WBC: 7.6 10*3/uL (ref 4.0–10.5)
WBC: 6.6 10*3/uL (ref 4.0–10.5)

## 2013-06-16 LAB — BASIC METABOLIC PANEL
BUN: 51 mg/dL — ABNORMAL HIGH (ref 6–23)
CO2: 19 meq/L (ref 19–32)
CREATININE: 3.77 mg/dL — AB (ref 0.50–1.10)
Calcium: 8.3 mg/dL — ABNORMAL LOW (ref 8.4–10.5)
Chloride: 107 mEq/L (ref 96–112)
GFR calc Af Amer: 13 mL/min — ABNORMAL LOW (ref >90)
GFR, EST NON AFRICAN AMERICAN: 11 mL/min — AB (ref >90)
Glucose, Bld: 108 mg/dL — ABNORMAL HIGH (ref 70–99)
Potassium: 3.6 mEq/L — ABNORMAL LOW (ref 3.7–5.3)
Sodium: 143 mEq/L (ref 137–147)

## 2013-06-16 LAB — GLUCOSE, CAPILLARY
GLUCOSE-CAPILLARY: 139 mg/dL — AB (ref 70–99)
Glucose-Capillary: 144 mg/dL — ABNORMAL HIGH (ref 70–99)

## 2013-06-16 LAB — CREATININE, SERUM
CREATININE: 3.73 mg/dL — AB (ref 0.50–1.10)
GFR calc Af Amer: 13 mL/min — ABNORMAL LOW (ref >90)
GFR, EST NON AFRICAN AMERICAN: 12 mL/min — AB (ref >90)

## 2013-06-16 LAB — CG4 I-STAT (LACTIC ACID): Lactic Acid, Venous: 1.27 mmol/L (ref 0.5–2.2)

## 2013-06-16 MED ORDER — BUDESONIDE-FORMOTEROL FUMARATE 160-4.5 MCG/ACT IN AERO
2.0000 | INHALATION_SPRAY | Freq: Two times a day (BID) | RESPIRATORY_TRACT | Status: DC
  Administered 2013-06-16 – 2013-07-02 (×28): 2 via RESPIRATORY_TRACT
  Filled 2013-06-16: qty 6

## 2013-06-16 MED ORDER — HEPARIN SODIUM (PORCINE) 5000 UNIT/ML IJ SOLN
5000.0000 [IU] | Freq: Three times a day (TID) | INTRAMUSCULAR | Status: DC
  Administered 2013-06-16 – 2013-06-30 (×39): 5000 [IU] via SUBCUTANEOUS
  Filled 2013-06-16 (×50): qty 1

## 2013-06-16 MED ORDER — DIPHENHYDRAMINE HCL 50 MG/ML IJ SOLN
25.0000 mg | Freq: Once | INTRAMUSCULAR | Status: AC
  Administered 2013-06-16: 25 mg via INTRAVENOUS
  Filled 2013-06-16: qty 1

## 2013-06-16 MED ORDER — PANTOPRAZOLE SODIUM 40 MG PO TBEC
40.0000 mg | DELAYED_RELEASE_TABLET | Freq: Every day | ORAL | Status: DC
  Administered 2013-06-16 – 2013-07-02 (×16): 40 mg via ORAL
  Filled 2013-06-16 (×14): qty 1

## 2013-06-16 MED ORDER — DULOXETINE HCL 60 MG PO CPEP
60.0000 mg | ORAL_CAPSULE | Freq: Every day | ORAL | Status: DC
  Administered 2013-06-16 – 2013-07-02 (×16): 60 mg via ORAL
  Filled 2013-06-16 (×17): qty 1

## 2013-06-16 MED ORDER — CLOPIDOGREL BISULFATE 75 MG PO TABS
75.0000 mg | ORAL_TABLET | Freq: Every day | ORAL | Status: DC
  Administered 2013-06-17 – 2013-07-02 (×15): 75 mg via ORAL
  Filled 2013-06-16 (×18): qty 1

## 2013-06-16 MED ORDER — ALBUTEROL SULFATE (2.5 MG/3ML) 0.083% IN NEBU: 2.5000 mg | INHALATION_SOLUTION | Freq: Four times a day (QID) | RESPIRATORY_TRACT | Status: DC | PRN

## 2013-06-16 MED ORDER — SODIUM CHLORIDE 0.9 % IV SOLN: INTRAVENOUS | Status: DC

## 2013-06-16 MED ORDER — VANCOMYCIN HCL 10 G IV SOLR
1250.0000 mg | Freq: Once | INTRAVENOUS | Status: AC
  Administered 2013-06-16: 1250 mg via INTRAVENOUS
  Filled 2013-06-16: qty 1250

## 2013-06-16 MED ORDER — ATORVASTATIN CALCIUM 80 MG PO TABS
80.0000 mg | ORAL_TABLET | Freq: Every day | ORAL | Status: DC
Start: 2013-06-17 — End: 2013-07-02
  Administered 2013-06-17 – 2013-07-01 (×15): 80 mg via ORAL
  Filled 2013-06-16 (×16): qty 1

## 2013-06-16 MED ORDER — TORSEMIDE 20 MG PO TABS
20.0000 mg | ORAL_TABLET | Freq: Two times a day (BID) | ORAL | Status: DC
Start: 2013-06-16 — End: 2013-06-16

## 2013-06-16 MED ORDER — HYDROCODONE-ACETAMINOPHEN 5-325 MG PO TABS
1.0000 | ORAL_TABLET | ORAL | Status: DC | PRN
  Administered 2013-06-17 – 2013-06-27 (×5): 1 via ORAL
  Filled 2013-06-16: qty 1
  Filled 2013-06-16: qty 2
  Filled 2013-06-16 (×3): qty 1

## 2013-06-16 MED ORDER — CIPROFLOXACIN IN D5W 400 MG/200ML IV SOLN
400.0000 mg | INTRAVENOUS | Status: DC
  Administered 2013-06-16 – 2013-06-17 (×2): 400 mg via INTRAVENOUS
  Filled 2013-06-16 (×3): qty 200

## 2013-06-16 MED ORDER — INSULIN GLARGINE 100 UNIT/ML ~~LOC~~ SOLN
28.0000 [IU] | Freq: Every day | SUBCUTANEOUS | Status: DC
  Administered 2013-06-16 – 2013-06-23 (×8): 28 [IU] via SUBCUTANEOUS
  Filled 2013-06-16 (×9): qty 0.28

## 2013-06-16 MED ORDER — ALPRAZOLAM 0.25 MG PO TABS
0.5000 mg | ORAL_TABLET | Freq: Every day | ORAL | Status: DC | PRN
  Administered 2013-06-21: 0.5 mg via ORAL
  Filled 2013-06-16: qty 1

## 2013-06-16 MED ORDER — INSULIN ASPART 100 UNIT/ML ~~LOC~~ SOLN
0.0000 [IU] | SUBCUTANEOUS | Status: DC
  Administered 2013-06-16: 1 [IU] via SUBCUTANEOUS
  Administered 2013-06-17 – 2013-06-18 (×2): 2 [IU] via SUBCUTANEOUS

## 2013-06-16 MED ORDER — VANCOMYCIN HCL IN DEXTROSE 1-5 GM/200ML-% IV SOLN
1000.0000 mg | INTRAVENOUS | Status: DC
  Filled 2013-06-16: qty 200

## 2013-06-16 MED ORDER — POTASSIUM CHLORIDE IN NACL 20-0.9 MEQ/L-% IV SOLN
INTRAVENOUS | Status: DC
  Administered 2013-06-16 – 2013-06-18 (×3): via INTRAVENOUS
  Filled 2013-06-16 (×5): qty 1000

## 2013-06-16 MED ORDER — SODIUM CHLORIDE 0.9 % IJ SOLN
3.0000 mL | Freq: Two times a day (BID) | INTRAMUSCULAR | Status: DC
  Administered 2013-06-18 – 2013-07-01 (×19): 3 mL via INTRAVENOUS

## 2013-06-16 MED ORDER — PREGABALIN 50 MG PO CAPS
50.0000 mg | ORAL_CAPSULE | Freq: Two times a day (BID) | ORAL | Status: DC
  Administered 2013-06-16 – 2013-07-02 (×31): 50 mg via ORAL
  Filled 2013-06-16 (×31): qty 1

## 2013-06-16 MED ORDER — ALBUTEROL SULFATE HFA 108 (90 BASE) MCG/ACT IN AERS: 2.0000 | INHALATION_SPRAY | Freq: Four times a day (QID) | RESPIRATORY_TRACT | Status: DC | PRN

## 2013-06-16 MED ORDER — NITROGLYCERIN 0.4 MG SL SUBL: 0.4000 mg | SUBLINGUAL_TABLET | SUBLINGUAL | Status: DC | PRN

## 2013-06-16 NOTE — ED Notes (Signed)
Patient denies pain and is resting comfortably.  

## 2013-06-16 NOTE — ED Provider Notes (Signed)
CSN: [id number]     Arrival date & time 06/16/13  1509 History   First MD Initiated Contact with Patient 06/16/13 1556     Chief Complaint  Patient presents with  . Abnormal Lab      HPI  Patient presents with the report of pain in her left leg and wound, swelling in her left leg, anda  Phone call  from her physician that her labs were abnormal.  Patient with history of hypertension, diabetes, peripheral vascular disease, coronary disease. Status post MI, and CABG 2007. Head right femoral popliteal bypass in 2014 complicated by wound infection and sepsis.  She's had a small wound that appeared on her left mid lower leg about 7-10 days ago. Seen by her primary care physician yesterday. Had labs obtained. Sent for a Doppler, and x-ray. Received a call from her physician's office to the emergency room because of "kidney failure". She continues to make urine. She has pain and swelling left lower leg.  Past Medical History  Diagnosis Date  . Coronary artery disease     a. s/p CABG 2007 (L-RI, S-dRCA, S-LAD, S-OM);  b. myoview 6/13:  anteroapical MI, no ischemia, EF 31%  . Diabetes mellitus   . Hypertension   . Asthma   . Bronchitis   . Reflux   . Hyperlipidemia   . Chronic systolic heart failure     a. echo 6/13: mild LVH, EF 15-20%, diff HK, Gr 1 DD, mild reduced RVSF, PASP 32.  Marland Kitchen GERD (gastroesophageal reflux disease)   . Arthritis   . CKD (chronic kidney disease)   . Chronic back pain   . Hypoxia     a. Adm 09/2012 - discharged with home O2 in setting of CHF.  Marland Kitchen Abnormal stress test     a. Nuc 10/2011: bright target which may be in left breast or L chest wall or axilla. Pt reports she f/u with PCP and mammogram was OK (left breast cyst).  . Complication of anesthesia     slow to awaken  . 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 Anginal pain     "rarely, had some 2 months ago, not sure if heart or arm, cardiologist aware.  . CHF (congestive heart failure)   . Shortness of  breath     at times- sensitive to perfume.  . Peripheral vascular disease   . Depression   . Stroke 05/2011    a. R ischemic CVA 05/2011. Right hand weakness.  . Pneumonia     2010ish  . Neuropathic pain     feet and hands  . Polio     as a child  . Altered level of consciousness     2 weeks now   Past Surgical History  Procedure Laterality Date  . Coronary artery bypass graft      LIMA to RI, SVG to distal RCA, SVG to LAD, SVG to OM. 2007  . Joint replacement      Right hip replacement   . Debridement skin / sq / muscle of trunk      s/p staph infection from CABG 2007  . Cystectomy      Subcutaneous  . Partial hip arthroplasty    . Stroke    . Femoral-popliteal bypass graft Right 01/20/2013    Procedure: BYPASS GRAFT RIGHT FEMORAL-BELOW KNEE POPLITEAL ARTERY WITH RINGED GORTEX GRAFT ;  Surgeon: Sherren Kerns, MD;  Location: [facility] OR;  Service: Vascular;  Laterality: Right;  .  Patch angioplasty Right 01/20/2013    Procedure: VEIN  PATCH ANGIOPLASTY;  Surgeon: Sherren Kerns, MD;  Location: [facility] OR;  Service: Vascular;  Laterality: Right;  . I&d extremity Right 02/06/2013    Procedure: DEBRIDEMENT RIGHT GROIN WOUND & APPLICATION OF WOUND VAC.;  Surgeon: Sherren Kerns, MD;  Location: [facility] OR;  Service: Vascular;  Laterality: Right;  . Groin debridement Right 02/11/2013    Procedure: GROIN DEBRIDEMENT & WOUND VAC APPLICATION;  Surgeon: Sherren Kerns, MD;  Location: [facility] OR;  Service: Vascular;  Laterality: Right;  . I&d extremity Right 02/23/2013    Procedure: IRRIGATION AND DEBRIDEMENT RIGHT GROIN;  Surgeon: Sherren Kerns, MD;  Location: [facility] OR;  Service: Vascular;  Laterality: Right;  . Application of wound vac Right 02/23/2013    Procedure: APPLICATION OF WOUND VAC;  Surgeon: Sherren Kerns, MD;  Location: [facility] OR;  Service: Vascular;  Laterality: Right;  . Hematoma evacuation Right 02/12/2013    Procedure: EVACUATION HEMATOMA;  Surgeon: Fransisco Hertz, MD;  Location: [facility] OR;   Service: Vascular;  Laterality: Right;   Family History  Problem Relation Age of Onset  . Coronary artery disease Father 38    Unclear if it was an MI  . Stroke Mother 62  . Coronary artery disease Brother 60  . Coronary artery disease Brother 20   History  Substance Use Topics  . Smoking status: Former Smoker -- 0.00 packs/day for 50 years    Types: Cigarettes    Quit date: 01/06/2013  . Smokeless tobacco: Never Used  . Alcohol Use: No   OB History   Grav Para Term Preterm Abortions TAB SAB Ect Mult Living                 Review of Systems  Constitutional: Positive for fatigue. Negative for fever, chills, diaphoresis and appetite change.  HENT: Negative for mouth sores, sore throat and trouble swallowing.   Eyes: Negative for visual disturbance.  Respiratory: Negative for cough, chest tightness, shortness of breath and wheezing.   Cardiovascular: Negative for chest pain.  Gastrointestinal: Negative for nausea, vomiting, abdominal pain, diarrhea and abdominal distention.  Endocrine: Negative for polydipsia, polyphagia and polyuria.  Genitourinary: Negative for dysuria, frequency and hematuria.  Musculoskeletal: Negative for gait problem.  Skin: Positive for wound. Negative for color change, pallor and rash.       Tissue swelling from the left mid thigh to his left lower leg.  Neurological: Negative for dizziness, syncope, light-headedness and headaches.  Hematological: Does not bruise/bleed easily.  Psychiatric/Behavioral: Negative for behavioral problems and confusion.      Allergies  Sulfa antibiotics; Codeine; Ivp dye; and Penicillins  Home Medications   Current Outpatient Rx  Name  Route  Sig  Dispense  Refill  . albuterol (PROVENTIL HFA;VENTOLIN HFA) 108 (90 BASE) MCG/ACT inhaler   Inhalation   Inhale 2 puffs into the lungs every 6 (six) hours as needed for wheezing or shortness of breath.          . ALPRAZolam (XANAX) 0.5 MG tablet   Oral   Take 0.5 mg  by mouth daily as needed for anxiety.          . budesonide-formoterol (SYMBICORT) 160-4.5 MCG/ACT inhaler   Inhalation   Inhale 2 puffs into the lungs 2 (two) times daily.          . clopidogrel (PLAVIX) 75 MG tablet   Oral   Take 75 mg by mouth daily with breakfast.         .  diphenhydrAMINE (BENADRYL) 25 mg capsule   Oral   Take 25 mg by mouth every 6 (six) hours as needed for allergies.          . DULoxetine (CYMBALTA) 60 MG capsule   Oral   Take 60 mg by mouth daily.         . insulin glargine (LANTUS) 100 UNIT/ML injection   Subcutaneous   Inject 28 Units into the skin at bedtime.          . insulin lispro (HUMALOG) 100 UNIT/ML injection   Subcutaneous   Inject 6-11 Units into the skin 3 (three) times daily before meals. Per sliding scale         . nitroGLYCERIN (NITROSTAT) 0.4 MG SL tablet   Sublingual   Place 0.4 mg under the tongue every 5 (five) minutes as needed for chest pain.          . pantoprazole (PROTONIX) 40 MG tablet   Oral   Take 40 mg by mouth daily.         . pregabalin (LYRICA) 50 MG capsule   Oral   Take 50 mg by mouth 2 (two) times daily.         . rosuvastatin (CRESTOR) 40 MG tablet   Oral   Take 40 mg by mouth daily.         Marland Kitchen. torsemide (DEMADEX) 20 MG tablet   Oral   Take 20 mg by mouth 2 (two) times daily. 2 tab daily by mouth         . ciprofloxacin (CIPRO) 750 MG tablet   Oral   Take 750 mg by mouth daily with breakfast. Finished sometime in January per patient          BP 151/96  Pulse 86  Temp(Src) 97.2 F (36.2 C) (Oral)  Resp 15  Ht 5' (1.524 m)  Wt 155 lb (70.308 kg)  BMI 30.27 kg/m2  SpO2 97% Physical Exam  Constitutional: She is oriented to person, place, and time. She appears well-developed and well-nourished. No distress.  HENT:  Head: Normocephalic.  Eyes: Conjunctivae are normal. Pupils are equal, round, and reactive to light. No scleral icterus.  Neck: Normal range of motion. Neck  supple. No thyromegaly present.  Cardiovascular: Normal rate and regular rhythm.  Exam reveals no gallop and no friction rub.   No murmur heard. Pulmonary/Chest: Effort normal and breath sounds normal. No respiratory distress. She has no wheezes. She has no rales.  Abdominal: Soft. Bowel sounds are normal. She exhibits no distension. There is no tenderness. There is no rebound.  Musculoskeletal:       Legs: Recollection me shows a healed groin wound. There is a healed incision in the medial aspect of the right lower leg just at the knee. She has good sensation to the distal foot. No obvious wounds or source of infection.   some soft tissue swelling from the mid thigh and distal. She has good sensation. She has a small wound in the left medial calf. Surrounding erythema and tenderness. No swelling specifically to the knee, or ankle joint. She has rubor of the leg at rest. Refill is 4 seconds. Pulses to DP and PT to Both feet. (See below).  Neurological: She is alert and oriented to person, place, and time.  Skin: Skin is warm and dry. No rash noted.  Psychiatric: She has a normal mood and affect. Her behavior is normal.    ED Course  Procedures (including critical care  time) Labs Review Labs Reviewed  CBC WITH DIFFERENTIAL - Abnormal; Notable for the following:    Hemoglobin 11.9 (*)    HCT 35.2 (*)    RDW 15.8 (*)    Platelets 136 (*)    All other components within normal limits  BASIC METABOLIC PANEL - Abnormal; Notable for the following:    Potassium 3.6 (*)    Glucose, Bld 108 (*)    BUN 51 (*)    Creatinine, Ser 3.77 (*)    Calcium 8.3 (*)    GFR calc non Af Amer 11 (*)    GFR calc Af Amer 13 (*)    All other components within normal limits  CULTURE, BLOOD (ROUTINE X 2)  CULTURE, BLOOD (ROUTINE X 2)  CG4 I-STAT (LACTIC ACID)   Imaging Review Dg Tibia/fibula Left  06/16/2013   CLINICAL DATA:  68 year old female with lower extremity soft tissue injury, abrasions/burning,  infected. Initial encounter.  EXAM: LEFT TIBIA AND FIBULA - 2 VIEW  COMPARISON:  Left lower extremity venous ultrasound from the same day reported separately.  FINDINGS: Small medial surgical clips near the level of the knee joint. Evidence of widespread subcutaneous stranding and increased density at the level of the tib-fib. Questionable increased striated pattern of muscles in the calf. No subcutaneous gas. Calcified atherosclerosis. No radiopaque foreign body identified.  No acute fracture or dislocation identified in the tibia or fibula. Bulky chronic degenerative spurring/heterotopic ossification at the tibial tuberosity. No cortical osteolysis identified.  IMPRESSION: 1. Increased soft tissue density compatible with cellulitis in this clinical setting. No subcutaneous gas or retained foreign body identified. 2. No acute osseous abnormality identified. Advanced chronic posttraumatic / degenerative changes at the tibial tuberosity.   Electronically Signed   By: Augusto Gamble M.D.   On: 06/16/2013 15:12   US Venous Img Lower Unilateral Left  06/16/2013   CLINICAL DATA:  Soft tissue edema  EXAM: LEFT LOWER EXTREMITY VENOUS DUPLEX ULTRASOUND  TECHNIQUE: Gray-scale sonography with graded compression, as well as color Doppler and duplex ultrasound, were performed to evaluate the deep venous system from the level of the common femoral vein through the popliteal and proximal calf veins. Spectral Doppler was utilized to evaluate flow at rest and with distal augmentation maneuvers.  COMPARISON:  None.  FINDINGS: Flow in the venous structures of the left lower extremity is spontaneous and phasic in all segments. There is normal compression and augmentation in the venous structures of the left lower extremity. Venous Doppler signal is normal in all regions. There is no thrombus in the deep or visualized superficial venous structures on the left. There is no deep venous incompetence. There is soft tissue edema which is  causing some diminished visualization of superficial venous structures in the thigh and calf regions.  IMPRESSION: No evidence of left lower extremity deep venous thrombosis. Moderate soft tissue edema.   Electronically Signed   By: Bretta Bang M.D.   On: 06/16/2013 14:30    EKG Interpretation   None       MDM   Final diagnoses:  Cellulitis    Signs of wound infection and cellulitis and leg compromise by vascular disease. She has pulses and capillary refill. I do not feel this is acute ischemic or thrombus/embolus pain. I do not have her lab results available to me. He was able to review her Doppler which does show no sign of a DVT. X-ray shows soft tissues but no sign of osteomyelitis. Plan will be evaluation of  her renal function. Blood cultures. IV antibiotics. Hospital admission. She had a long call. Hospital course after a wound infection in her right leg. She currently is wound infection in her left.    Rolland Porter, MD 06/16/13 [phone]

## 2013-06-16 NOTE — ED Notes (Signed)
Pt. Does  Not want any pain medication at the present time.  Will inform the RN when she wants some.

## 2013-06-16 NOTE — ED Notes (Signed)
NOTIFIED DR. Fayrene Fearing IN PERSON OF PATIENTS LAB RESULTS OF CG4+ LACTIC ACID @17 :19 PM ,06/16/2013.

## 2013-06-16 NOTE — ED Notes (Addendum)
Pt presents to department for evaluation of abnormal labs. Creatinine of 3.97 and BUN of 50 per Dr. Ricci Barker. Pt was told by PCP to come to ED due to poor kidney function. Pt also states recent swelling to bilateral legs and abdomen. Also states sores on bilateral lower legs, she is diabetic. Pt is conscious alert and oriented x4. 5/10 pain to legs at the time.

## 2013-06-16 NOTE — Progress Notes (Addendum)
ANTIBIOTIC CONSULT NOTE - INITIAL  Pharmacy Consult for vancomycin/cipro Indication: cellulitis  Allergies  Allergen Reactions  . Sulfa Antibiotics Anaphylaxis and Shortness Of Breath  . Codeine Nausea Only and Other (See Comments)    Severe headache  . Ivp Dye [Iodinated Diagnostic Agents] Hives and Other (See Comments)    Severe anxiety  . Penicillins Swelling and Other (See Comments)    "huge sores". Pt has tolerated both Zosyn and Keflex in the past.    Patient Measurements: Height: 5' (152.4 cm) Weight: 155 lb (70.308 kg) IBW/kg (Calculated) : 45.5   Vital Signs: Temp: 97.2 F (36.2 C) (02/10 1520) Temp src: Oral (02/10 1520) BP: 151/96 mmHg (02/10 1645) Pulse Rate: 86 (02/10 1645) Intake/Output from previous day:   Intake/Output from this shift:    Labs:  Recent Labs  06/16/13 1623  WBC 7.6  HGB 11.9*  PLT 136*  CREATININE 3.77*   Estimated Creatinine Clearance: 12.7 ml/min (by C-G formula based on Cr of 3.77). No results found for this basename: VANCOTROUGH, VANCOPEAK, VANCORANDOM, GENTTROUGH, GENTPEAK, GENTRANDOM, TOBRATROUGH, TOBRAPEAK, TOBRARND, AMIKACINPEAK, AMIKACINTROU, AMIKACIN,  in the last 72 hours   Microbiology: No results found for this or any previous visit (from the past 720 hour(s)).  Medical History: Past Medical History  Diagnosis Date  . Coronary artery disease     a. s/p CABG 2007 (L-RI, S-dRCA, S-LAD, S-OM);  b. myoview 6/13:  anteroapical MI, no ischemia, EF 31%  . Diabetes mellitus   . Hypertension   . Asthma   . Bronchitis   . Reflux   . Hyperlipidemia   . Chronic systolic heart failure     a. echo 6/13: mild LVH, EF 15-20%, diff HK, Gr 1 DD, mild reduced RVSF, PASP 32.  Marland Kitchen GERD (gastroesophageal reflux disease)   . Arthritis   . CKD (chronic kidney disease)   . Chronic back pain   . Hypoxia     a. Adm 09/2012 - discharged with home O2 in setting of CHF.  Marland Kitchen Abnormal stress test     a. Nuc 10/2011: bright target which may  be in left breast or L chest wall or axilla. Pt reports she f/u with PCP and mammogram was OK (left breast cyst).  . Complication of anesthesia     slow to awaken  . 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 Anginal pain     "rarely, had some 2 months ago, not sure if heart or arm, cardiologist aware.  . CHF (congestive heart failure)   . Shortness of breath     at times- sensitive to perfume.  . Peripheral vascular disease   . Depression   . Stroke 05/2011    a. R ischemic CVA 05/2011. Right hand weakness.  . Pneumonia     2010ish  . Neuropathic pain     feet and hands  . Polio     as a child  . Altered level of consciousness     2 weeks now   Assessment: 68 year old female presents to [facility] with left leg pain and swelling. Small wound noted on leg that appeared a little over a week ago. Labs obtained by primary care provider showed acute renal failure with scr of 3.7 here in the ED baseline appears to be around 2-2.2. No fevers noted, wbc normal.  Goal of Therapy:  Vancomycin trough level 10-15 mcg/ml  Plan:  Measure antibiotic drug levels at steady state Follow up culture results Vancomycin 1250mg   IV x 1 tonight then 1g q48 hours Will consider checking random level prior to redosing if renal function does not improve in next 48 hours Cipro 400mg  q24 hours  Sheppard CoilFrank Wilson PharmD., BCPS Clinical Pharmacist Pager [phone] 06/16/2013 6:38 PM

## 2013-06-16 NOTE — ED Notes (Signed)
Pt. Having hives and itching. Reported to Dr. Fayrene Fearing,

## 2013-06-16 NOTE — ED Notes (Signed)
Report given to Jody, RN.

## 2013-06-16 NOTE — Progress Notes (Signed)
Called ED for report. Room ready for admission.  

## 2013-06-16 NOTE — H&P (Addendum)
Hospitalist Admission History and Physical  Patient name: [name] record number: [id number] Date of birth: Dec 25, 1945 Age: 68 y.o. Gender: female  Primary Care Provider: Juanell Fairly, MD Vascular: Ruta Hinds Renal:[place]   Chief Complaint: cellulitis, AKI  History of Present Illness:This is a 68 y.o. year old female with significant prior vascular history including peripheral vascular disease status post femoropopliteal bypass with claudication, coronary artery disease status post multiple MIs status post CABG, CVA, type 2 diabetes, chronic renal insufficiency with a baseline creatinine of 2.7 presenting with cellulitis and acute kidney injury. Per the patient she noticed a small on the left anterior shin about 7-10 days ago. Of note, patient had a very extended hospitalization for essentially the entire month of October for nonhealing right groin wound. Please see discharge summary for full details. Patient denies any known trauma prior to wound. Area became progressively red and irritated. Some subjective fevers and chills at home. Was seen by her PCP for symptoms earlier today. Was sent for a lower extremity ultrasound as well as left tib-fib x-ray and blood work. Lower extremity ultrasound was negative for DVT. However, tib-fib was indicative of soft tissue swelling consistent with cellulitis. Per patient, she'll slightly marked increase in her serum creatinine there warranted the patient coming to the ER since possible. In the ER,  Noted creatinine 3.7 as was BUN of 51. K @ 3.6. White blood cell count within normal limits as some 0.6. Hemoglobin 11.9. Afebrile presentation. Hemodynamically stable. Distal pulses intact per EDP. No distal numbness or paresthesias. Pt started on vancomycin for cellulitis coverage. No noted osteomyelitic changes on imaging.   Patient Active Problem List   Diagnosis Date Noted  . Cellulitis 06/16/2013  . Right groin wound 03/05/2013  . PVD  (peripheral vascular disease) with claudication 01/29/2013  . Pain in limb-Right groin and leg 01/29/2013  . Swelling of limb-Right  groin 01/29/2013  . Peripheral vascular disease, unspecified 12/25/2012  . Weakness generalized 04/16/2012  . Acute respiratory failure 10/24/2011  . 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 Acute on chronic combined systolic and diastolic congestive heart failure 10/24/2011  . Chronic systolic congestive heart failure 05/22/2011  . CVA (cerebral infarction) 05/18/2011  . Chest pain 05/18/2011  . CAD (coronary artery disease) 05/18/2011  . HTN (hypertension) 05/18/2011  . Dyslipidemia 05/18/2011  . Tobacco abuse 05/18/2011   Past Medical History: Past Medical History  Diagnosis Date  . Coronary artery disease     a. s/p CABG 2007 (L-RI, S-dRCA, S-LAD, S-OM);  b. myoview 6/13:  anteroapical MI, no ischemia, EF 31%  . Diabetes mellitus   . Hypertension   . Asthma   . Bronchitis   . Reflux   . Hyperlipidemia   . Chronic systolic heart failure     a. echo 6/13: mild LVH, EF 15-20%, diff HK, Gr 1 DD, mild reduced RVSF, PASP 32.  Marland Kitchen GERD (gastroesophageal reflux disease)   . Arthritis   . CKD (chronic kidney disease)   . Chronic back pain   . Hypoxia     a. Adm 09/2012 - discharged with home O2 in setting of CHF.  Marland Kitchen Abnormal stress test     a. Nuc 10/2011: bright target which may be in left breast or L chest wall or axilla. Pt reports she f/u with PCP and mammogram was OK (left breast cyst).  . Complication of anesthesia     slow to awaken  . Myocardial  infarct 1994    multiple MI's  . Anginal pain     "rarely, had some 2 months ago, not sure if heart or arm, cardiologist aware.  . CHF (congestive heart failure)   . Shortness of breath     at times- sensitive to perfume.  . Peripheral vascular disease   . Depression   . Stroke 05/2011    a. R ischemic CVA 05/2011. Right hand weakness.  . Pneumonia      2010ish  . Neuropathic pain     feet and hands  . Polio     as a child  . Altered level of consciousness     2 weeks now    Past Surgical History: Past Surgical History  Procedure Laterality Date  . Coronary artery bypass graft      LIMA to RI, SVG to distal RCA, SVG to LAD, SVG to OM. 2007  . Joint replacement      Right hip replacement   . Debridement skin / sq / muscle of trunk      s/p staph infection from CABG 2007  . Cystectomy      Subcutaneous  . Partial hip arthroplasty    . Stroke    . Femoral-popliteal bypass graft Right 01/20/2013    Procedure: BYPASS GRAFT RIGHT FEMORAL-BELOW KNEE POPLITEAL ARTERY WITH RINGED GORTEX GRAFT ;  Surgeon: Elam Dutch, MD;  Location: [place];  Service: Vascular;  Laterality: Right;  . Patch angioplasty Right 01/20/2013    Procedure: VEIN  PATCH ANGIOPLASTY;  Surgeon: Elam Dutch, MD;  Location: [place];  Service: Vascular;  Laterality: Right;  . I&d extremity Right 02/06/2013    Procedure: DEBRIDEMENT RIGHT GROIN WOUND & APPLICATION OF WOUND VAC.;  Surgeon: Elam Dutch, MD;  Location: [place];  Service: Vascular;  Laterality: Right;  . Groin debridement Right 02/11/2013    Procedure: [place];  Surgeon: Elam Dutch, MD;  Location: [place];  Service: Vascular;  Laterality: Right;  . I&d extremity Right 02/23/2013    Procedure: IRRIGATION AND DEBRIDEMENT RIGHT GROIN;  Surgeon: Elam Dutch, MD;  Location: [place];  Service: Vascular;  Laterality: Right;  . Application of wound vac Right 02/23/2013    Procedure: APPLICATION OF WOUND VAC;  Surgeon: Elam Dutch, MD;  Location: [place];  Service: Vascular;  Laterality: Right;  . Hematoma evacuation Right 02/12/2013    Procedure: EVACUATION HEMATOMA;  Surgeon: Conrad [place], MD;  Location: [place];  Service: Vascular;  Laterality: Right;    Social History: History   Social History  . Marital Status: Divorced    Spouse Name: N/A    Number of  Children: 55  . Years of Education: N/A   Occupational History  . Retired  [employer]    [place] Topics  . Smoking status: Former Smoker -- 0.00 packs/day for 50 years    Types: Cigarettes    Quit date: 01/06/2013  . Smokeless tobacco: Never Used  . Alcohol Use: No  . Drug Use: No  . Sexual Activity: None   Other Topics Concern  . None   Social History Narrative   Lives with grandson 42 years old.    Family History: Family History  Problem Relation Age of Onset  . Coronary artery disease Father 67    Unclear if it was an MI  . Stroke Mother 51  . Coronary artery disease Brother 34  .  Coronary artery disease Brother 66    Allergies: Allergies  Allergen Reactions  . Sulfa Antibiotics Anaphylaxis and Shortness Of Breath  . Codeine Nausea Only and Other (See Comments)    Severe headache  . Ivp Dye [Iodinated Diagnostic Agents] Hives and Other (See Comments)    Severe anxiety  . Penicillins Swelling and Other (See Comments)    "huge sores". Pt has tolerated both Zosyn and Keflex in the past.    Current Facility-Administered Medications  Medication Dose Route Frequency Provider Last Rate Last Dose  . 0.9 % NaCl with KCl 20 mEq/ L  infusion   Intravenous Continuous Shanda Howells, MD      . albuterol (PROVENTIL HFA;VENTOLIN HFA) 108 (90 BASE) MCG/ACT inhaler 2 puff  2 puff Inhalation Q6H PRN Shanda Howells, MD      . ALPRAZolam Duanne Moron) tablet 0.5 mg  0.5 mg Oral Daily PRN Shanda Howells, MD      . Derrill Memo ON 06/17/2013] atorvastatin (LIPITOR) tablet 80 mg  80 mg Oral q1800 Shanda Howells, MD      . budesonide-formoterol [facility]) 160-4.5 MCG/ACT inhaler 2 puff  2 puff Inhalation BID Shanda Howells, MD      . Derrill Memo ON 06/17/2013] clopidogrel (PLAVIX) tablet 75 mg  75 mg Oral Q breakfast Shanda Howells, MD      . DULoxetine (CYMBALTA) DR capsule 60 mg  60 mg Oral Daily Shanda Howells, MD      . heparin injection 5,000 Units  5,000 Units Subcutaneous Q8H  Shanda Howells, MD      . HYDROcodone-acetaminophen (NORCO/VICODIN) 5-325 MG per tablet 1 tablet  1 tablet Oral Q4H PRN Shanda Howells, MD      . insulin glargine (LANTUS) injection 28 Units  28 Units Subcutaneous QHS Shanda Howells, MD      . nitroGLYCERIN (NITROSTAT) SL tablet 0.4 mg  0.4 mg Sublingual Q5 min PRN Shanda Howells, MD      . pantoprazole (PROTONIX) EC tablet 40 mg  40 mg Oral Daily Shanda Howells, MD      . pregabalin (LYRICA) capsule 50 mg  50 mg Oral BID Shanda Howells, MD      . sodium chloride 0.9 % injection 3 mL  3 mL Intravenous Q12H Shanda Howells, MD      . vancomycin (VANCOCIN) 1,250 mg in sodium chloride 0.9 % 250 mL IVPB  1,250 mg Intravenous Once Georgina Peer, RPH 166.7 mL/hr at 06/16/13 1751 1,250 mg at 06/16/13 1751  . [START ON 06/18/2013] vancomycin (VANCOCIN) IVPB 1000 mg/200 mL premix  1,000 mg Intravenous Q48H Georgina Peer, [facility]       Current Outpatient Prescriptions  Medication Sig Dispense Refill  . albuterol (PROVENTIL HFA;VENTOLIN HFA) 108 (90 BASE) MCG/ACT inhaler Inhale 2 puffs into the lungs every 6 (six) hours as needed for wheezing or shortness of breath.       . ALPRAZolam (XANAX) 0.5 MG tablet Take 0.5 mg by mouth daily as needed for anxiety.       . budesonide-formoterol (SYMBICORT) 160-4.5 MCG/ACT inhaler Inhale 2 puffs into the lungs 2 (two) times daily.       . clopidogrel (PLAVIX) 75 MG tablet Take 75 mg by mouth daily with breakfast.      . diphenhydrAMINE (BENADRYL) 25 mg capsule Take 25 mg by mouth every 6 (six) hours as needed for allergies.       . DULoxetine (CYMBALTA) 60 MG capsule Take 60 mg by mouth daily.      Marland Kitchen  insulin glargine (LANTUS) 100 UNIT/ML injection Inject 28 Units into the skin at bedtime.       . insulin lispro (HUMALOG) 100 UNIT/ML injection Inject 6-11 Units into the skin 3 (three) times daily before meals. Per sliding scale      . nitroGLYCERIN (NITROSTAT) 0.4 MG SL tablet Place 0.4 mg under the tongue every 5 (five)  minutes as needed for chest pain.       . pantoprazole (PROTONIX) 40 MG tablet Take 40 mg by mouth daily.      . pregabalin (LYRICA) 50 MG capsule Take 50 mg by mouth 2 (two) times daily.      . rosuvastatin (CRESTOR) 40 MG tablet Take 40 mg by mouth daily.      Marland Kitchen torsemide (DEMADEX) 20 MG tablet Take 20 mg by mouth 2 (two) times daily. 2 tab daily by mouth      . ciprofloxacin (CIPRO) 750 MG tablet Take 750 mg by mouth daily with breakfast. Finished sometime in January per patient      . [DISCONTINUED] potassium chloride (K-DUR,KLOR-CON) 10 MEQ tablet Take 10 mEq by mouth daily.        Review Of Systems: 12 point ROS negative except as noted above in HPI.  Physical Exam: Filed Vitals:   06/16/13 1645  BP: 151/96  Pulse: 86  Temp:   Resp: 15    General: alert and cooperative HEENT: PERRLA and extra ocular movement intact Heart: S1, S2 normal, no murmur, rub or gallop, regular rate and rhythm Lungs: clear to auscultation Abdomen: abdomen is soft without significant tenderness, masses, organomegaly or guarding Extremities: L anterior shin with mid shin erythema and TTP. No purulent drainage. Distal pulses intact in LLE. mild swelling 2-3+ diffusely in LLE.  Skin:as above  Neurology: normal without focal findings  Labs and Imaging: Lab Results  Component Value Date/Time   NA 143 06/16/2013  4:23 PM   K 3.6* 06/16/2013  4:23 PM   CL 107 06/16/2013  4:23 PM   CO2 19 06/16/2013  4:23 PM   BUN 51* 06/16/2013  4:23 PM   CREATININE 3.77* 06/16/2013  4:23 PM   GLUCOSE 108* 06/16/2013  4:23 PM   Lab Results  Component Value Date   WBC 7.6 06/16/2013   HGB 11.9* 06/16/2013   HCT 35.2* 06/16/2013   MCV 82.1 06/16/2013   PLT 136* 06/16/2013   Dg Tibia/fibula Left  06/16/2013   CLINICAL DATA:  68 year old female with lower extremity soft tissue injury, abrasions/burning, infected. Initial encounter.  EXAM: LEFT TIBIA AND FIBULA - 2 VIEW  COMPARISON:  Left lower extremity venous ultrasound from  the same day reported separately.  FINDINGS: Small medial surgical clips near the level of the knee joint. Evidence of widespread subcutaneous stranding and increased density at the level of the tib-fib. Questionable increased striated pattern of muscles in the calf. No subcutaneous gas. Calcified atherosclerosis. No radiopaque foreign body identified.  No acute fracture or dislocation identified in the tibia or fibula. Bulky chronic degenerative spurring/heterotopic ossification at the tibial tuberosity. No cortical osteolysis identified.  IMPRESSION: 1. Increased soft tissue density compatible with cellulitis in this clinical setting. No subcutaneous gas or retained foreign body identified. 2. No acute osseous abnormality identified. Advanced chronic posttraumatic / degenerative changes at the tibial tuberosity.   Electronically Signed   By: Lars Pinks M.D.   On: 06/16/2013 15:12   US Venous Img Lower Unilateral Left  06/16/2013   CLINICAL DATA:  Soft tissue edema  EXAM: LEFT LOWER EXTREMITY VENOUS DUPLEX ULTRASOUND  TECHNIQUE: Gray-scale sonography with graded compression, as well as color Doppler and duplex ultrasound, were performed to evaluate the deep venous system from the level of the common femoral vein through the popliteal and proximal calf veins. Spectral Doppler was utilized to evaluate flow at rest and with distal augmentation maneuvers.  COMPARISON:  None.  FINDINGS: Flow in the venous structures of the left lower extremity is spontaneous and phasic in all segments. There is normal compression and augmentation in the venous structures of the left lower extremity. Venous Doppler signal is normal in all regions. There is no thrombus in the deep or visualized superficial venous structures on the left. There is no deep venous incompetence. There is soft tissue edema which is causing some diminished visualization of superficial venous structures in the thigh and calf regions.  IMPRESSION: No evidence of  left lower extremity deep venous thrombosis. Moderate soft tissue edema.   Electronically Signed   By: Lowella Grip M.D.   On: 06/16/2013 14:30     Assessment and Plan: [name] is a 68 y.o. year old female presenting with cellulitis, AKI  Cellulitis: We'll continue with vancomycin for treatment. Add on ciprofloxacin for pseudomonas and gram-negative coverage in the setting of vascular insufficiency and baseline diabetes. Given baseline vascular status, will likely need vascular consult while patient is in house. No radiologic evidence of osteomyelitis or imaging. Will check ESR to correlate. Afebrile currently.  AKI: Baseline Cr 1.7-2. 3.7 today. Acute on chronic renal sufficiency. Somewhat unclear etiology. Bicarbonate lower limits of normal. May be approaching ATN vs. RTA. Will hold torsemide and gently hydrate. Check FeNa. Renal consult in am . Avoid NSAIDs.  Diabetes: Sliding scale insulin. A1c.  Coronary artery disease/CHF: Clinically stable. Relatively euvolemic on exam today. Continue home outpatient regimen.  COPD: Stable for respiratory standpoint. No increased work of breathing or wheezing. Stable on room air. Continue outpatient management.  FEN/GI: Heart healthy heart modified diet. PPI. Prophylaxis: Subcutaneous Disposition: Pending further evaluation Code Status: Full code       Shanda Howells MD  Pager: [phone]

## 2013-06-17 ENCOUNTER — Encounter (HOSPITAL_COMMUNITY): Payer: Self-pay | Admitting: General Practice

## 2013-06-17 DIAGNOSIS — N184 Chronic kidney disease, stage 4 (severe): Secondary | ICD-10-CM | POA: Diagnosis present

## 2013-06-17 DIAGNOSIS — I5022 Chronic systolic (congestive) heart failure: Secondary | ICD-10-CM

## 2013-06-17 DIAGNOSIS — I509 Heart failure, unspecified: Secondary | ICD-10-CM

## 2013-06-17 DIAGNOSIS — N179 Acute kidney failure, unspecified: Secondary | ICD-10-CM | POA: Diagnosis present

## 2013-06-17 LAB — COMPREHENSIVE METABOLIC PANEL
ALBUMIN: 2.8 g/dL — AB (ref 3.5–5.2)
ALK PHOS: 90 U/L (ref 39–117)
ALT: 17 U/L (ref 0–35)
AST: 15 U/L (ref 0–37)
BUN: 50 mg/dL — AB (ref 6–23)
CO2: 17 mEq/L — ABNORMAL LOW (ref 19–32)
Calcium: 8.2 mg/dL — ABNORMAL LOW (ref 8.4–10.5)
Chloride: 105 mEq/L (ref 96–112)
Creatinine, Ser: 3.77 mg/dL — ABNORMAL HIGH (ref 0.50–1.10)
GFR calc Af Amer: 13 mL/min — ABNORMAL LOW (ref >90)
GFR calc non Af Amer: 11 mL/min — ABNORMAL LOW (ref >90)
Glucose, Bld: 99 mg/dL (ref 70–99)
POTASSIUM: 3.4 meq/L — AB (ref 3.7–5.3)
SODIUM: 142 meq/L (ref 137–147)
Total Bilirubin: 0.4 mg/dL (ref 0.3–1.2)
Total Protein: 7.2 g/dL (ref 6.0–8.3)

## 2013-06-17 LAB — C-REACTIVE PROTEIN: CRP: 0.7 mg/dL — ABNORMAL HIGH (ref <0.60)

## 2013-06-17 LAB — GLUCOSE, CAPILLARY
GLUCOSE-CAPILLARY: 102 mg/dL — AB (ref 70–99)
Glucose-Capillary: 86 mg/dL (ref 70–99)
Glucose-Capillary: 129 mg/dL — ABNORMAL HIGH (ref 70–99)
Glucose-Capillary: 135 mg/dL — ABNORMAL HIGH (ref 70–99)
Glucose-Capillary: 172 mg/dL — ABNORMAL HIGH (ref 70–99)

## 2013-06-17 LAB — CREATININE, URINE, RANDOM: CREATININE, URINE: 119.9 mg/dL

## 2013-06-17 LAB — HEMOGLOBIN A1C
Hgb A1c MFr Bld: 7.9 % — ABNORMAL HIGH (ref <5.7)
Mean Plasma Glucose: 180 mg/dL — ABNORMAL HIGH (ref <117)

## 2013-06-17 LAB — SEDIMENTATION RATE: SED RATE: 55 mm/h — AB (ref 0–22)

## 2013-06-17 LAB — SODIUM, URINE, RANDOM

## 2013-06-17 NOTE — Progress Notes (Signed)
ABI attempted.  The patient is unwilling to try blood pressure cuff on the left lower extremity due to small wound on the left anterior shin.  She is sensitive to any touch.  She asked if we could try again tomorrow.

## 2013-06-17 NOTE — Progress Notes (Signed)
[facility] PROGRESS NOTE  Heidi HoppingMary I [name] ZOX:[id number] DOB: 1946/02/02 DOA: 06/16/2013 PCP: [name], [name], MD  Assessment/Plan:   Cellulitis: Improving. Continue current. May be able to change to oral antibiotics tomorrow Active Problems:   Acute renal failure: Likely due to acute infection and prerenal state. Patient reports that she has been taking more torsemide than what is prescribed. Continue gentle hydration, Demadex held. Creatinine last year was over 2. Monitor.   HTN (hypertension)   Chronic systolic congestive heart failure, compensated   PVD (peripheral vascular disease) with claudication: Patient was not able to tolerate ABIs.   Chronic kidney disease (CKD), stage IV (severe) Tinea pedis: Antifungals topical   HPI/Subjective: Wants to go home tomorrow. No dyspnea.  Objective: Filed Vitals:   06/17/13 1655  BP: 138/87  Pulse: 89  Temp: 97.5 F (36.4 C)  Resp: 18    Intake/Output Summary (Last 24 hours) at 06/17/13 2026 Last data filed at 06/17/13 1849  Gross per 24 hour  Intake   1660 ml  Output      0 ml  Net   1660 ml   Filed Weights   06/16/13 1520 06/16/13 2032  Weight: 70.308 kg (155 lb) 76.522 kg (168 lb 11.2 oz)    Exam:   General:  Sleepy. Arousable. Appropriate appearing in  Cardiovascular: Regular rate rhythm without murmurs gallops rubs  Respiratory: Clear to auscultation bilaterally without wheezes rhonchi or rales  Abdomen: Soft nontender nondistended  Ext: Ulceration noted bilateral pretibial areas. Erythema mild from left foot to mid calf. Both legs hyperpigmented. No pitting edema.  Basic Metabolic Panel:  Recent Labs Lab 06/16/13 1623 06/16/13 1915 06/17/13 0335  NA 143  --  142  K 3.6*  --  3.4*  CL 107  --  105  CO2 19  --  17*  GLUCOSE 108*  --  99  BUN 51*  --  50*  CREATININE 3.77* 3.73* 3.77*  CALCIUM 8.3*  --  8.2*   Liver Function Tests:  Recent Labs Lab 06/17/13 0335  AST 15  ALT 17   ALKPHOS 90  BILITOT 0.4  PROT 7.2  ALBUMIN 2.8*   No results found for this basename: LIPASE, AMYLASE,  in the last 168 hours No results found for this basename: AMMONIA,  in the last 168 hours CBC:  Recent Labs Lab 06/16/13 1623 06/16/13 1915  WBC 7.6 6.6  NEUTROABS 5.2 4.4  HGB 11.9* 11.8*  HCT 35.2* 35.4*  MCV 82.1 81.4  PLT 136* 127*   Cardiac Enzymes: No results found for this basename: CKTOTAL, CKMB, CKMBINDEX, TROPONINI,  in the last 168 hours BNP (last 3 results)  Recent Labs  09/25/12 1743 09/29/12 0530  PROBNP 16961.0* 5247.0*   CBG:  Recent Labs Lab 06/16/13 2354 06/17/13 0409 06/17/13 0739 06/17/13 1152 06/17/13 1654  GLUCAP 144* 102* 86 129* 135*    Recent Results (from the past 240 hour(s))  CULTURE, BLOOD (ROUTINE X 2)     Status: None   Collection Time    06/16/13  4:43 PM      Result Value Ref Range Status   Specimen Description BLOOD ARM LEFT   Final   Special Requests BOTTLES DRAWN AEROBIC AND ANAEROBIC 5CCBLUE 4CCRED   Final   Culture  Setup Time     Final   Value: 06/16/2013 22:10     Performed at [facility]   Culture     Final   Value:  BLOOD CULTURE RECEIVED NO GROWTH TO DATE CULTURE WILL BE HELD FOR 5 DAYS BEFORE ISSUING A FINAL NEGATIVE REPORT     Performed at [facility]   Report Status PENDING   Incomplete  CULTURE, BLOOD (ROUTINE X 2)     Status: None   Collection Time    06/16/13  4:53 PM      Result Value Ref Range Status   Specimen Description BLOOD ARM RIGHT   Final   Special Requests BOTTLES DRAWN AEROBIC AND ANAEROBIC 10CC   Final   Culture  Setup Time     Final   Value: 06/16/2013 22:10     Performed at [facility]   Culture     Final   Value:        BLOOD CULTURE RECEIVED NO GROWTH TO DATE CULTURE WILL BE HELD FOR 5 DAYS BEFORE ISSUING A FINAL NEGATIVE REPORT     Performed at [facility]   Report Status PENDING   Incomplete     Studies: Dg Tibia/fibula  Left  06/16/2013   CLINICAL DATA:  68 year old female with lower extremity soft tissue injury, abrasions/burning, infected. Initial encounter.  EXAM: LEFT TIBIA AND FIBULA - 2 VIEW  COMPARISON:  Left lower extremity venous ultrasound from the same day reported separately.  FINDINGS: Small medial surgical clips near the level of the knee joint. Evidence of widespread subcutaneous stranding and increased density at the level of the tib-fib. Questionable increased striated pattern of muscles in the calf. No subcutaneous gas. Calcified atherosclerosis. No radiopaque foreign body identified.  No acute fracture or dislocation identified in the tibia or fibula. Bulky chronic degenerative spurring/heterotopic ossification at the tibial tuberosity. No cortical osteolysis identified.  IMPRESSION: 1. Increased soft tissue density compatible with cellulitis in this clinical setting. No subcutaneous gas or retained foreign body identified. 2. No acute osseous abnormality identified. Advanced chronic posttraumatic / degenerative changes at the tibial tuberosity.   Electronically Signed   By: Augusto Gamble M.D.   On: 06/16/2013 15:12   US Venous Img Lower Unilateral Left  06/16/2013   CLINICAL DATA:  Soft tissue edema  EXAM: LEFT LOWER EXTREMITY VENOUS DUPLEX ULTRASOUND  TECHNIQUE: Gray-scale sonography with graded compression, as well as color Doppler and duplex ultrasound, were performed to evaluate the deep venous system from the level of the common femoral vein through the popliteal and proximal calf veins. Spectral Doppler was utilized to evaluate flow at rest and with distal augmentation maneuvers.  COMPARISON:  None.  FINDINGS: Flow in the venous structures of the left lower extremity is spontaneous and phasic in all segments. There is normal compression and augmentation in the venous structures of the left lower extremity. Venous Doppler signal is normal in all regions. There is no thrombus in the deep or visualized  superficial venous structures on the left. There is no deep venous incompetence. There is soft tissue edema which is causing some diminished visualization of superficial venous structures in the thigh and calf regions.  IMPRESSION: No evidence of left lower extremity deep venous thrombosis. Moderate soft tissue edema.   Electronically Signed   By: Bretta Bang M.D.   On: 06/16/2013 14:30    Scheduled Meds: . atorvastatin  80 mg Oral q1800  . budesonide-formoterol  2 puff Inhalation BID  . ciprofloxacin  400 mg Intravenous Q24H  . clopidogrel  75 mg Oral Q breakfast  . DULoxetine  60 mg Oral Daily  . heparin  5,000 Units Subcutaneous 3  times per day  . insulin aspart  0-9 Units Subcutaneous 6 times per day  . insulin glargine  28 Units Subcutaneous QHS  . pantoprazole  40 mg Oral Daily  . pregabalin  50 mg Oral BID  . sodium chloride  3 mL Intravenous Q12H  . [START ON 06/18/2013] vancomycin  1,000 mg Intravenous Q48H   Continuous Infusions: . 0.9 % NaCl with KCl 20 mEq / L 75 mL/hr at 06/17/13 1352    Time spent: 35 minutes  Ignatz Deis L  [facility] Pager [phone]. If 7PM-7AM, please contact night-coverage at [email or web address], password [email or web address] 06/17/2013, 8:26 PM  LOS: 1 day

## 2013-06-17 NOTE — Progress Notes (Signed)
Utilization review completed.  

## 2013-06-17 NOTE — Progress Notes (Signed)
   CARE MANAGEMENT NOTE 06/17/2013  Patient:  [name], [name]   Account Number:  [id number]  Date Initiated:  06/17/2013  Documentation initiated by:  Darlyne Russian  Subjective/Objective Assessment:   admitted with cellulits left lower leg  medical hx: INPT 02/06/2013 - 02/27/2013 right groin infection, [facility]     Action/Plan:   progression of care and discharge planning   Anticipated DC Date:  06/21/2013   Anticipated DC Plan:  HOME W HOME HEALTH SERVICES         Choice offered to / List presented to:             Status of service:  In process, will continue to follow Medicare Important Message given?   (If response is "NO", the following Medicare IM given date fields will be blank) Date Medicare IM given:   Date Additional Medicare IM given:    Discharge Disposition:    Per UR Regulation:    If discussed at Long Length of Stay Meetings, dates discussed:    Comments:

## 2013-06-18 ENCOUNTER — Inpatient Hospital Stay (HOSPITAL_COMMUNITY): Payer: Medicare Other

## 2013-06-18 DIAGNOSIS — I5043 Acute on chronic combined systolic (congestive) and diastolic (congestive) heart failure: Secondary | ICD-10-CM

## 2013-06-18 DIAGNOSIS — E11329 Type 2 diabetes mellitus with mild nonproliferative diabetic retinopathy without macular edema: Secondary | ICD-10-CM

## 2013-06-18 DIAGNOSIS — E785 Hyperlipidemia, unspecified: Secondary | ICD-10-CM

## 2013-06-18 DIAGNOSIS — E1139 Type 2 diabetes mellitus with other diabetic ophthalmic complication: Secondary | ICD-10-CM

## 2013-06-18 LAB — URINALYSIS, ROUTINE W REFLEX MICROSCOPIC
Bilirubin Urine: NEGATIVE
Glucose, UA: NEGATIVE mg/dL
Ketones, ur: NEGATIVE mg/dL
Nitrite: NEGATIVE
Protein, ur: 100 mg/dL — AB
SPECIFIC GRAVITY, URINE: 1.02 (ref 1.005–1.030)
UROBILINOGEN UA: 0.2 mg/dL (ref 0.0–1.0)
pH: 5.5 (ref 5.0–8.0)

## 2013-06-18 LAB — BASIC METABOLIC PANEL
BUN: 52 mg/dL — ABNORMAL HIGH (ref 6–23)
CHLORIDE: 106 meq/L (ref 96–112)
CO2: 17 mEq/L — ABNORMAL LOW (ref 19–32)
CREATININE: 3.87 mg/dL — AB (ref 0.50–1.10)
Calcium: 8.2 mg/dL — ABNORMAL LOW (ref 8.4–10.5)
GFR calc non Af Amer: 11 mL/min — ABNORMAL LOW (ref >90)
GFR, EST AFRICAN AMERICAN: 13 mL/min — AB (ref >90)
GLUCOSE: 179 mg/dL — AB (ref 70–99)
POTASSIUM: 4.1 meq/L (ref 3.7–5.3)
Sodium: 139 mEq/L (ref 137–147)

## 2013-06-18 LAB — URINE MICROSCOPIC-ADD ON

## 2013-06-18 LAB — GLUCOSE, CAPILLARY
GLUCOSE-CAPILLARY: 164 mg/dL — AB (ref 70–99)
GLUCOSE-CAPILLARY: 134 mg/dL — AB (ref 70–99)
Glucose-Capillary: 153 mg/dL — ABNORMAL HIGH (ref 70–99)
Glucose-Capillary: 137 mg/dL — ABNORMAL HIGH (ref 70–99)

## 2013-06-18 LAB — CREATININE, URINE, RANDOM: Creatinine, Urine: 115.72 mg/dL

## 2013-06-18 LAB — SODIUM, URINE, RANDOM: Sodium, Ur: <20 mEq/L

## 2013-06-18 MED ORDER — CIPROFLOXACIN HCL 250 MG PO TABS
250.0000 mg | ORAL_TABLET | ORAL | Status: DC
  Administered 2013-06-18: 250 mg via ORAL
  Filled 2013-06-18: qty 1

## 2013-06-18 MED ORDER — FUROSEMIDE 80 MG PO TABS
160.0000 mg | ORAL_TABLET | Freq: Three times a day (TID) | ORAL | Status: DC
  Administered 2013-06-18 – 2013-06-20 (×6): 160 mg via ORAL
  Filled 2013-06-18 (×9): qty 2

## 2013-06-18 MED ORDER — DOXYCYCLINE HYCLATE 100 MG PO TABS
100.0000 mg | ORAL_TABLET | Freq: Two times a day (BID) | ORAL | Status: DC
  Administered 2013-06-18 – 2013-06-27 (×20): 100 mg via ORAL
  Filled 2013-06-18 (×22): qty 1

## 2013-06-18 MED ORDER — CIPROFLOXACIN HCL 250 MG PO TABS
250.0000 mg | ORAL_TABLET | Freq: Two times a day (BID) | ORAL | Status: DC
  Filled 2013-06-18 (×3): qty 1

## 2013-06-18 MED ORDER — KETOCONAZOLE 2 % EX CREA
TOPICAL_CREAM | Freq: Two times a day (BID) | CUTANEOUS | Status: DC
  Administered 2013-06-19 – 2013-07-02 (×23): via TOPICAL
  Filled 2013-06-18 (×2): qty 15

## 2013-06-18 MED ORDER — CEFUROXIME AXETIL 500 MG PO TABS
500.0000 mg | ORAL_TABLET | Freq: Two times a day (BID) | ORAL | Status: DC
  Filled 2013-06-18 (×3): qty 1

## 2013-06-18 NOTE — Progress Notes (Addendum)
[facility] PROGRESS NOTE  LYLI STRACENER TCY:[id number] DOB: [birth year] DOA: 06/16/2013 PCP: [name], MD  Assessment/Plan:   Cellulitis: Improving. Change to PO abx. Active Problems:   Acute renal failure: Likely due to acute infection and prerenal state, but creatinine not improving with IVF and holding demadex. Patient reports that she has been taking more torsemide than what is prescribed. Continue gentle hydration, Demadex held. Creatinine last year was over 2. Sees Dr. Marisue Humble. Continue current, consult nephrology for recs. Watch for CHF, as EF 15 - 20%   HTN (hypertension)   Chronic systolic congestive heart failure, compensated   PVD (peripheral vascular disease) with claudication: Patient was not able to tolerate ABIs.   Chronic kidney disease (CKD), stage 3-4 Tinea pedis: Antifungals topical Venous stasis dermatitis Hyperlipidemia DM ok   HPI/Subjective: Wants to go home. No dyspnea. Leg continues to improve.   Objective: Filed Vitals:   06/18/13 0840  BP: 120/80  Pulse: 89  Temp: 97.8 F (36.6 C)  Resp: 20    Intake/Output Summary (Last 24 hours) at 06/18/13 0952 Last data filed at 06/18/13 0900  Gross per 24 hour  Intake   2840 ml  Output      2 ml  Net   2838 ml   Filed Weights   06/16/13 1520 06/16/13 2032 06/17/13 2100  Weight: 70.308 kg (155 lb) 76.522 kg (168 lb 11.2 oz) 76.7 kg (169 lb 1.5 oz)    Exam:   General:  Nontoxic. Talking on phone.   Cardiovascular: Regular rate rhythm without murmurs gallops rubs  Respiratory: Clear to auscultation bilaterally without wheezes rhonchi or rales  Abdomen: Soft nontender nondistended  Ext: Ulceration noted bilateral pretibial areas. Erythema mild from left foot to mid calf improving. Both legs hyperpigmented. No pitting edema.  Basic Metabolic Panel:  Recent Labs Lab 06/16/13 1623 06/16/13 1915 06/17/13 0335 06/18/13 0432  NA 143  --  142 139  K 3.6*  --  3.4* 4.1  CL 107  --   105 106  CO2 19  --  17* 17*  GLUCOSE 108*  --  99 179*  BUN 51*  --  50* 52*  CREATININE 3.77* 3.73* 3.77* 3.87*  CALCIUM 8.3*  --  8.2* 8.2*   Liver Function Tests:  Recent Labs Lab 06/17/13 0335  AST 15  ALT 17  ALKPHOS 90  BILITOT 0.4  PROT 7.2  ALBUMIN 2.8*   No results found for this basename: LIPASE, AMYLASE,  in the last 168 hours No results found for this basename: AMMONIA,  in the last 168 hours CBC:  Recent Labs Lab 06/16/13 1623 06/16/13 1915  WBC 7.6 6.6  NEUTROABS 5.2 4.4  HGB 11.9* 11.8*  HCT 35.2* 35.4*  MCV 82.1 81.4  PLT 136* 127*   Cardiac Enzymes: No results found for this basename: CKTOTAL, CKMB, CKMBINDEX, TROPONINI,  in the last 168 hours BNP (last 3 results)  Recent Labs  09/25/12 1743 09/29/12 0530  PROBNP 16961.0* 5247.0*   CBG:  Recent Labs Lab 06/17/13 0739 06/17/13 1152 06/17/13 1654 06/17/13 2027 06/18/13 0746  GLUCAP 86 129* 135* 172* 153*    Recent Results (from the past 240 hour(s))  CULTURE, BLOOD (ROUTINE X 2)     Status: None   Collection Time    06/16/13  4:43 PM      Result Value Ref Range Status   Specimen Description BLOOD ARM LEFT   Final   Special Requests BOTTLES DRAWN AEROBIC AND ANAEROBIC  5CCBLUE 4CCRED   Final   Culture  Setup Time     Final   Value: 06/16/2013 22:10     Performed at [facility]   Culture     Final   Value:        BLOOD CULTURE RECEIVED NO GROWTH TO DATE CULTURE WILL BE HELD FOR 5 DAYS BEFORE ISSUING A FINAL NEGATIVE REPORT     Performed at [facility]   Report Status PENDING   Incomplete  CULTURE, BLOOD (ROUTINE X 2)     Status: None   Collection Time    06/16/13  4:53 PM      Result Value Ref Range Status   Specimen Description BLOOD ARM RIGHT   Final   Special Requests BOTTLES DRAWN AEROBIC AND ANAEROBIC 10CC   Final   Culture  Setup Time     Final   Value: 06/16/2013 22:10     Performed at [facility]   Culture     Final   Value:        BLOOD  CULTURE RECEIVED NO GROWTH TO DATE CULTURE WILL BE HELD FOR 5 DAYS BEFORE ISSUING A FINAL NEGATIVE REPORT     Performed at [facility]   Report Status PENDING   Incomplete     Studies: Dg Tibia/fibula Left  06/16/2013   CLINICAL DATA:  68 year old female with lower extremity soft tissue injury, abrasions/burning, infected. Initial encounter.  EXAM: LEFT TIBIA AND FIBULA - 2 VIEW  COMPARISON:  Left lower extremity venous ultrasound from the same day reported separately.  FINDINGS: Small medial surgical clips near the level of the knee joint. Evidence of widespread subcutaneous stranding and increased density at the level of the tib-fib. Questionable increased striated pattern of muscles in the calf. No subcutaneous gas. Calcified atherosclerosis. No radiopaque foreign body identified.  No acute fracture or dislocation identified in the tibia or fibula. Bulky chronic degenerative spurring/heterotopic ossification at the tibial tuberosity. No cortical osteolysis identified.  IMPRESSION: 1. Increased soft tissue density compatible with cellulitis in this clinical setting. No subcutaneous gas or retained foreign body identified. 2. No acute osseous abnormality identified. Advanced chronic posttraumatic / degenerative changes at the tibial tuberosity.   Electronically Signed   By: Augusto Gamble M.D.   On: 06/16/2013 15:12   US Venous Img Lower Unilateral Left  06/16/2013   CLINICAL DATA:  Soft tissue edema  EXAM: LEFT LOWER EXTREMITY VENOUS DUPLEX ULTRASOUND  TECHNIQUE: Gray-scale sonography with graded compression, as well as color Doppler and duplex ultrasound, were performed to evaluate the deep venous system from the level of the common femoral vein through the popliteal and proximal calf veins. Spectral Doppler was utilized to evaluate flow at rest and with distal augmentation maneuvers.  COMPARISON:  None.  FINDINGS: Flow in the venous structures of the left lower extremity is spontaneous and phasic  in all segments. There is normal compression and augmentation in the venous structures of the left lower extremity. Venous Doppler signal is normal in all regions. There is no thrombus in the deep or visualized superficial venous structures on the left. There is no deep venous incompetence. There is soft tissue edema which is causing some diminished visualization of superficial venous structures in the thigh and calf regions.  IMPRESSION: No evidence of left lower extremity deep venous thrombosis. Moderate soft tissue edema.   Electronically Signed   By: Bretta Bang M.D.   On: 06/16/2013 14:30    Scheduled Meds: .  atorvastatin  80 mg Oral q1800  . budesonide-formoterol  2 puff Inhalation BID  . ciprofloxacin  400 mg Intravenous Q24H  . clopidogrel  75 mg Oral Q breakfast  . DULoxetine  60 mg Oral Daily  . heparin  5,000 Units Subcutaneous 3 times per day  . insulin aspart  0-9 Units Subcutaneous 6 times per day  . insulin glargine  28 Units Subcutaneous QHS  . pantoprazole  40 mg Oral Daily  . pregabalin  50 mg Oral BID  . sodium chloride  3 mL Intravenous Q12H  . vancomycin  1,000 mg Intravenous Q48H   Continuous Infusions: . 0.9 % NaCl with KCl 20 mEq / L 75 mL/hr at 06/18/13 0151    Time spent: 25 minutes  Shakaya Bhullar L  [facility] Pager [phone]. If 7PM-7AM, please contact night-coverage at [email or web address], password [email or web address] 06/18/2013, 9:52 AM  LOS: 2 days   Addendum: called by RN about outpt urine cx showed E coli resistant to FQ. Sensitive to rocephin. Asked that cx be faxed.  Changed cipro to ceftin. Cont doxy for cellulitis  Crista Curborinna Andry Bogden, M.D.

## 2013-06-18 NOTE — Consult Note (Signed)
Reason for Consult:AKI/CKD  Referring Physician: Dr Margret Chance I Franckowiak is an 68 y.o. female.  HPI: 68 yr old female with hx of CAD, S/P CABg with complic, PVD with bypass and complic, low EF 97-02%, DM about 3yr, HTN longer, GERD, ^ lipids, tobacco abuse, periph neuropathy, CVA, now with cellulitis of L leg..  Seen by [place] in hosp in 10/14 with AKI assoc with hypoperfusion at time of postop groin infx. Cr peaked in 4s from 1.5 and leveled out in mid 2s.  Not seen in office due to missing appt.  Cr now 3.7-3.8.  Hx of UTIs in past. No recent hematuria or voiding sx.  Has itching with initial Vanco dose.  Notes now very swollen above baseline status, but not SOB.  Constitutional: weak, feels fair Eyes: some decreased vision Ears, nose, mouth, throat, and face: negative Respiratory: no cough, sleeping on 2 pillows at 30 degrees Cardiovascular: edema, no CP Gastrointestinal: negative Genitourinary:negative Integument/breast: bruising, breaks skin easily Hematologic/lymphatic: as above Musculoskeletal:arth of hands, knees L>R, elbows, back Neurological: negative Endocrine: bs variable here Allergic/Immunologic: multiple AB, itching with initail Vanco   Primary Nephrologist [place].  Past Medical History  Diagnosis Date  . Coronary artery disease     a. s/p CABG 2007 (L-RI, S-dRCA, S-LAD, S-OM);  b. myoview 6/13:  anteroapical MI, no ischemia, EF 31%  . Diabetes mellitus   . Hypertension   . Asthma   . Bronchitis   . Reflux   . Hyperlipidemia   . Chronic systolic heart failure     a. echo 6/13: mild LVH, EF 15-20%, diff HK, Gr 1 DD, mild reduced RVSF, PASP 32.  Marland Kitchen GERD (gastroesophageal reflux disease)   . Arthritis   . CKD (chronic kidney disease)   . Chronic back pain   . Hypoxia     a. Adm 09/2012 - discharged with home O2 in setting of CHF.  Marland Kitchen Abnormal stress test     a. Nuc 10/2011: bright target which may be in left breast or L chest wall or axilla. Pt reports she f/u  with PCP and mammogram was OK (left breast cyst).  . Complication of anesthesia     slow to awaken  . 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 Anginal pain     "rarely, had some 2 months ago, not sure if heart or arm, cardiologist aware.  . CHF (congestive heart failure)   . Shortness of breath     at times- sensitive to perfume.  . Peripheral vascular disease   . Depression   . Stroke 05/2011    a. R ischemic CVA 05/2011. Right hand weakness.  . Pneumonia     2010ish  . Neuropathic pain     feet and hands  . Polio     as a child  . Altered level of consciousness     2 weeks now  . Cellulitis 06/2013    LEFT LOWER EXTREMITY    Past Surgical History  Procedure Laterality Date  . Coronary artery bypass graft      LIMA to RI, SVG to distal RCA, SVG to LAD, SVG to OM. 2007  . Joint replacement      Right hip replacement   . Debridement skin / sq / muscle of trunk      s/p staph infection from CABG 2007  . Cystectomy      Subcutaneous  . Partial hip arthroplasty    . Stroke    .  Femoral-popliteal bypass graft Right 01/20/2013    Procedure: BYPASS GRAFT RIGHT FEMORAL-BELOW KNEE POPLITEAL ARTERY WITH RINGED GORTEX GRAFT ;  Surgeon: Elam Dutch, MD;  Location: [place];  Service: Vascular;  Laterality: Right;  . Patch angioplasty Right 01/20/2013    Procedure: VEIN  PATCH ANGIOPLASTY;  Surgeon: Elam Dutch, MD;  Location: [place];  Service: Vascular;  Laterality: Right;  . I&d extremity Right 02/06/2013    Procedure: DEBRIDEMENT RIGHT GROIN WOUND & APPLICATION OF WOUND VAC.;  Surgeon: Elam Dutch, MD;  Location: [place];  Service: Vascular;  Laterality: Right;  . Groin debridement Right 02/11/2013    Procedure: [place];  Surgeon: Elam Dutch, MD;  Location: [place];  Service: Vascular;  Laterality: Right;  . I&d extremity Right 02/23/2013    Procedure: [place];  Surgeon: Elam Dutch, MD;  Location: [place];  Service: Vascular;  Laterality: Right;  . Application of wound vac Right 02/23/2013    Procedure: APPLICATION OF WOUND VAC;  Surgeon: Elam Dutch, MD;  Location: [facility] OR;  Service: Vascular;  Laterality: Right;  . Hematoma evacuation Right 02/12/2013    Procedure: EVACUATION HEMATOMA;  Surgeon: Conrad [place], MD;  Location: [facility] OR;  Service: Vascular;  Laterality: Right;    Family History  Problem Relation Age of Onset  . Coronary artery disease Father 50    Unclear if it was an MI  . Stroke Mother 106  . Coronary artery disease Brother 66  . Coronary artery disease Brother 62    Social History:  reports that she quit smoking about 5 months ago. Her smoking use included Cigarettes. She smoked 0.00 packs per day for 50 years. She has never used smokeless tobacco. She reports that she does not drink alcohol or use illicit drugs.  Allergies:  Allergies  Allergen Reactions  . Sulfa Antibiotics Anaphylaxis and Shortness Of Breath  . Codeine Nausea Only and Other (See Comments)    Severe headache  . Ivp Dye [Iodinated Diagnostic Agents] Hives and Other (See Comments)    Severe anxiety  . Penicillins Swelling and Other (See Comments)    "huge sores". Pt has tolerated both Zosyn and Keflex in the past.    Medications:  I have reviewed the patient's current medications. Prior to Admission:  Prescriptions prior to admission  Medication Sig Dispense Refill  . albuterol (PROVENTIL HFA;VENTOLIN HFA) 108 (90 BASE) MCG/ACT inhaler Inhale 2 puffs into the lungs every 6 (six) hours as needed for wheezing or shortness of breath.       . ALPRAZolam (XANAX) 0.5 MG tablet Take 0.5 mg by mouth daily as needed for anxiety.       . budesonide-formoterol (SYMBICORT) 160-4.5 MCG/ACT inhaler Inhale 2 puffs into the lungs 2 (two) times daily.       . clopidogrel (PLAVIX) 75 MG tablet Take 75 mg by mouth daily with breakfast.      . diphenhydrAMINE (BENADRYL) 25 mg capsule Take 25 mg by mouth every 6  (six) hours as needed for allergies.       . DULoxetine (CYMBALTA) 60 MG capsule Take 60 mg by mouth daily.      . insulin glargine (LANTUS) 100 UNIT/ML injection Inject 28 Units into the skin at bedtime.       . insulin lispro (HUMALOG) 100 UNIT/ML injection Inject 6-11 Units into the skin 3 (three) times daily before meals. Per sliding scale      .  nitroGLYCERIN (NITROSTAT) 0.4 MG SL tablet Place 0.4 mg under the tongue every 5 (five) minutes as needed for chest pain.       . pantoprazole (PROTONIX) 40 MG tablet Take 40 mg by mouth daily.      . pregabalin (LYRICA) 50 MG capsule Take 50 mg by mouth 2 (two) times daily.      . rosuvastatin (CRESTOR) 40 MG tablet Take 40 mg by mouth daily.      Marland Kitchen torsemide (DEMADEX) 20 MG tablet Take 20 mg by mouth 2 (two) times daily. 2 tab daily by mouth      . ciprofloxacin (CIPRO) 750 MG tablet Take 750 mg by mouth daily with breakfast. Finished sometime in January per patient        Results for orders placed during the hospital encounter of 06/16/13 (from the past 48 hour(s))  CBC WITH DIFFERENTIAL     Status: Abnormal   Collection Time    06/16/13  4:23 PM      Result Value Ref Range   WBC 7.6  4.0 - 10.5 K/uL   RBC 4.29  3.87 - 5.11 MIL/uL   Hemoglobin 11.9 (*) 12.0 - 15.0 g/dL   HCT 35.2 (*) 36.0 - 46.0 %   MCV 82.1  78.0 - 100.0 fL   MCH 27.7  26.0 - 34.0 pg   MCHC 33.8  30.0 - 36.0 g/dL   RDW 15.8 (*) 11.5 - 15.5 %   Platelets 136 (*) 150 - 400 K/uL   Neutrophils Relative % 69  43 - 77 %   Neutro Abs 5.2  1.7 - 7.7 K/uL   Lymphocytes Relative 20  12 - 46 %   Lymphs Abs 1.6  0.7 - 4.0 K/uL   Monocytes Relative 9  3 - 12 %   Monocytes Absolute 0.7  0.1 - 1.0 K/uL   Eosinophils Relative 2  0 - 5 %   Eosinophils Absolute 0.2  0.0 - 0.7 K/uL   Basophils Relative 0  0 - 1 %   Basophils Absolute 0.0  0.0 - 0.1 K/uL  BASIC METABOLIC PANEL     Status: Abnormal   Collection Time    06/16/13  4:23 PM      Result Value Ref Range   Sodium 143   137 - 147 mEq/L   Potassium 3.6 (*) 3.7 - 5.3 mEq/L   Chloride 107  96 - 112 mEq/L   CO2 19  19 - 32 mEq/L   Glucose, Bld 108 (*) 70 - 99 mg/dL   BUN 51 (*) 6 - 23 mg/dL   Creatinine, Ser 3.77 (*) 0.50 - 1.10 mg/dL   Calcium 8.3 (*) 8.4 - 10.5 mg/dL   GFR calc non Af Amer 11 (*) >90 mL/min   GFR calc Af Amer 13 (*) >90 mL/min   Comment: (NOTE)     The eGFR has been calculated using the CKD EPI equation.     This calculation has not been validated in all clinical situations.     eGFR's persistently <90 mL/min signify possible Chronic Kidney     Disease.  CULTURE, BLOOD (ROUTINE X 2)     Status: None   Collection Time    06/16/13  4:43 PM      Result Value Ref Range   Specimen Description BLOOD ARM LEFT     Special Requests BOTTLES DRAWN AEROBIC AND ANAEROBIC 5CCBLUE 4CCRED     Culture  Setup Time  Value: 06/16/2013 22:10     Performed at [facility]   Culture       Value:        BLOOD CULTURE RECEIVED NO GROWTH TO DATE CULTURE WILL BE HELD FOR 5 DAYS BEFORE ISSUING A FINAL NEGATIVE REPORT     Performed at [facility]   Report Status PENDING    CULTURE, BLOOD (ROUTINE X 2)     Status: None   Collection Time    06/16/13  4:53 PM      Result Value Ref Range   Specimen Description BLOOD ARM RIGHT     Special Requests BOTTLES DRAWN AEROBIC AND ANAEROBIC 10CC     Culture  Setup Time       Value: 06/16/2013 22:10     Performed at [facility]   Culture       Value:        BLOOD CULTURE RECEIVED NO GROWTH TO DATE CULTURE WILL BE HELD FOR 5 DAYS BEFORE ISSUING A FINAL NEGATIVE REPORT     Performed at [facility]   Report Status PENDING    CG4 I-STAT (LACTIC ACID)     Status: None   Collection Time    06/16/13  5:01 PM      Result Value Ref Range   Lactic Acid, Venous 1.27  0.5 - 2.2 mmol/L  CREATININE, SERUM     Status: Abnormal   Collection Time    06/16/13  7:15 PM      Result Value Ref Range   Creatinine, Ser 3.73 (*) 0.50 - 1.10  mg/dL   GFR calc non Af Amer 12 (*) >90 mL/min   GFR calc Af Amer 13 (*) >90 mL/min   Comment: (NOTE)     The eGFR has been calculated using the CKD EPI equation.     This calculation has not been validated in all clinical situations.     eGFR's persistently <90 mL/min signify possible Chronic Kidney     Disease.  CBC WITH DIFFERENTIAL     Status: Abnormal   Collection Time    06/16/13  7:15 PM      Result Value Ref Range   WBC 6.6  4.0 - 10.5 K/uL   RBC 4.35  3.87 - 5.11 MIL/uL   Hemoglobin 11.8 (*) 12.0 - 15.0 g/dL   HCT 83.3 (*) 58.2 - 51.8 %   MCV 81.4  78.0 - 100.0 fL   MCH 27.1  26.0 - 34.0 pg   MCHC 33.3  30.0 - 36.0 g/dL   RDW 98.4 (*) 21.0 - 31.2 %   Platelets 127 (*) 150 - 400 K/uL   Neutrophils Relative % 67  43 - 77 %   Neutro Abs 4.4  1.7 - 7.7 K/uL   Lymphocytes Relative 24  12 - 46 %   Lymphs Abs 1.6  0.7 - 4.0 K/uL   Monocytes Relative 7  3 - 12 %   Monocytes Absolute 0.4  0.1 - 1.0 K/uL   Eosinophils Relative 2  0 - 5 %   Eosinophils Absolute 0.2  0.0 - 0.7 K/uL   Basophils Relative 1  0 - 1 %   Basophils Absolute 0.0  0.0 - 0.1 K/uL  HEMOGLOBIN A1C     Status: Abnormal   Collection Time    06/16/13  7:41 PM      Result Value Ref Range   Hemoglobin A1C 7.9 (*) <5.7 %   Comment: (NOTE)  According to the ADA Clinical Practice Recommendations for 2011, when     HbA1c is used as a screening test:      >=6.5%   Diagnostic of Diabetes Mellitus               (if abnormal result is confirmed)     5.7-6.4%   Increased risk of developing Diabetes Mellitus     References:Diagnosis and Classification of Diabetes Mellitus,Diabetes     Care,2011,34(Suppl 1):S62-S69 and Standards of Medical Care in             Diabetes - 2011,Diabetes Care,2011,34 (Suppl 1):S11-S61.   Mean Plasma Glucose 180 (*) <117 mg/dL   Comment: Performed at [facility]  GLUCOSE, CAPILLARY     Status: Abnormal    Collection Time    06/16/13  8:30 PM      Result Value Ref Range   Glucose-Capillary 139 (*) 70 - 99 mg/dL  C-REACTIVE PROTEIN     Status: Abnormal   Collection Time    06/16/13  9:38 PM      Result Value Ref Range   CRP 0.7 (*) <0.60 mg/dL   Comment: Performed at [facility]  GLUCOSE, CAPILLARY     Status: Abnormal   Collection Time    06/16/13 11:54 PM      Result Value Ref Range   Glucose-Capillary 144 (*) 70 - 99 mg/dL  COMPREHENSIVE METABOLIC PANEL     Status: Abnormal   Collection Time    06/17/13  3:35 AM      Result Value Ref Range   Sodium 142  137 - 147 mEq/L   Potassium 3.4 (*) 3.7 - 5.3 mEq/L   Chloride 105  96 - 112 mEq/L   CO2 17 (*) 19 - 32 mEq/L   Glucose, Bld 99  70 - 99 mg/dL   BUN 50 (*) 6 - 23 mg/dL   Creatinine, Ser 5.98 (*) 0.50 - 1.10 mg/dL   Calcium 8.2 (*) 8.4 - 10.5 mg/dL   Total Protein 7.2  6.0 - 8.3 g/dL   Albumin 2.8 (*) 3.5 - 5.2 g/dL   AST 15  0 - 37 U/L   ALT 17  0 - 35 U/L   Alkaline Phosphatase 90  39 - 117 U/L   Total Bilirubin 0.4  0.3 - 1.2 mg/dL   GFR calc non Af Amer 11 (*) >90 mL/min   GFR calc Af Amer 13 (*) >90 mL/min   Comment: (NOTE)     The eGFR has been calculated using the CKD EPI equation.     This calculation has not been validated in all clinical situations.     eGFR's persistently <90 mL/min signify possible Chronic Kidney     Disease.  SEDIMENTATION RATE     Status: Abnormal   Collection Time    06/17/13  3:35 AM      Result Value Ref Range   Sed Rate 55 (*) 0 - 22 mm/hr  GLUCOSE, CAPILLARY     Status: Abnormal   Collection Time    06/17/13  4:09 AM      Result Value Ref Range   Glucose-Capillary 102 (*) 70 - 99 mg/dL  GLUCOSE, CAPILLARY     Status: None   Collection Time    06/17/13  7:39 AM      Result Value Ref Range   Glucose-Capillary 86  70 - 99 mg/dL  GLUCOSE, CAPILLARY     Status: Abnormal   Collection  Time    06/17/13 11:52 AM      Result Value Ref Range   Glucose-Capillary 129 (*) 70  - 99 mg/dL  GLUCOSE, CAPILLARY     Status: Abnormal   Collection Time    06/17/13  4:54 PM      Result Value Ref Range   Glucose-Capillary 135 (*) 70 - 99 mg/dL  SODIUM, URINE, RANDOM     Status: None   Collection Time    06/17/13  5:45 PM      Result Value Ref Range   Sodium, Ur <20     Comment: REPEATED TO VERIFY  CREATININE, URINE, RANDOM     Status: None   Collection Time    06/17/13  5:45 PM      Result Value Ref Range   Creatinine, Urine 119.90    GLUCOSE, CAPILLARY     Status: Abnormal   Collection Time    06/17/13  8:27 PM      Result Value Ref Range   Glucose-Capillary 172 (*) 70 - 99 mg/dL  BASIC METABOLIC PANEL     Status: Abnormal   Collection Time    06/18/13  4:32 AM      Result Value Ref Range   Sodium 139  137 - 147 mEq/L   Potassium 4.1  3.7 - 5.3 mEq/L   Comment: DELTA CHECK NOTED   Chloride 106  96 - 112 mEq/L   CO2 17 (*) 19 - 32 mEq/L   Glucose, Bld 179 (*) 70 - 99 mg/dL   BUN 52 (*) 6 - 23 mg/dL   Creatinine, Ser 3.87 (*) 0.50 - 1.10 mg/dL   Calcium 8.2 (*) 8.4 - 10.5 mg/dL   GFR calc non Af Amer 11 (*) >90 mL/min   GFR calc Af Amer 13 (*) >90 mL/min   Comment: (NOTE)     The eGFR has been calculated using the CKD EPI equation.     This calculation has not been validated in all clinical situations.     eGFR's persistently <90 mL/min signify possible Chronic Kidney     Disease.  GLUCOSE, CAPILLARY     Status: Abnormal   Collection Time    06/18/13  7:46 AM      Result Value Ref Range   Glucose-Capillary 153 (*) 70 - 99 mg/dL  GLUCOSE, CAPILLARY     Status: Abnormal   Collection Time    06/18/13 11:56 AM      Result Value Ref Range   Glucose-Capillary 164 (*) 70 - 99 mg/dL    No results found.  ROS Blood pressure 153/99, pulse 87, temperature 97 F (36.1 C), temperature source Oral, resp. rate 21, height 5' (1.524 m), weight 76.7 kg (169 lb 1.5 oz), SpO2 93.00%. Physical Exam Physical Examination: General appearance - chronically ill  appearing and edematous Mental status - alert, oriented to person, place, and time Eyes - funduscopic exam abnormal  Changes of DM Mouth - mucous membranes moist, pharynx normal without lesions Neck - adenopathy noted PCL Lymphatics - no palpable lymphadenopathy, no hepatosplenomegaly, posterior cervical nodes Chest - diminished bs on R base with rales above, rales L base Heart - normal rate, regular rhythm, normal S1, S2, no murmurs, rubs, clicks or gallops, systolic murmur ZO1/0 at apex Abdomen - pos bs, liver down 6 cm , soft, obese Extremities - pedal edema 3 +, bilat fem bruits, no DP Skin - thin, bruises, striae on abdm sore on ant shin on L with mild  surrounding erythema  Assessment/Plan: 1 CKD 4, now worsened.  ? Secondary to hemodynamic vs other effect of infx (immunologic) ? Cardiorenal . Severe vol xs, lungs and periph.. Needs diuresis, and w/u for secondary etio 2 CKD suspect DM 3 Hypertension: not an issue 4. 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 Metabolic Bone Disease: check PTH 6 Cellulitis improving P U/S, PTH, UA, Lasix   Has low FENA, .35,  Check CXR Afshin Chrystal L 06/18/2013, 4:18 PM

## 2013-06-19 DIAGNOSIS — I129 Hypertensive chronic kidney disease with stage 1 through stage 4 chronic kidney disease, or unspecified chronic kidney disease: Secondary | ICD-10-CM

## 2013-06-19 LAB — RENAL FUNCTION PANEL
Albumin: 2.8 g/dL — ABNORMAL LOW (ref 3.5–5.2)
BUN: 50 mg/dL — AB (ref 6–23)
CALCIUM: 8.2 mg/dL — AB (ref 8.4–10.5)
CHLORIDE: 108 meq/L (ref 96–112)
CO2: 17 meq/L — AB (ref 19–32)
Creatinine, Ser: 4.01 mg/dL — ABNORMAL HIGH (ref 0.50–1.10)
GFR calc Af Amer: 12 mL/min — ABNORMAL LOW (ref >90)
GFR calc non Af Amer: 11 mL/min — ABNORMAL LOW (ref >90)
GLUCOSE: 125 mg/dL — AB (ref 70–99)
Phosphorus: 6.8 mg/dL — ABNORMAL HIGH (ref 2.3–4.6)
Potassium: 4.4 mEq/L (ref 3.7–5.3)
Sodium: 141 mEq/L (ref 137–147)

## 2013-06-19 LAB — COMPREHENSIVE METABOLIC PANEL
ALBUMIN: 2.8 g/dL — AB (ref 3.5–5.2)
ALT: 15 U/L (ref 0–35)
AST: 14 U/L (ref 0–37)
Alkaline Phosphatase: 87 U/L (ref 39–117)
BILIRUBIN TOTAL: 0.3 mg/dL (ref 0.3–1.2)
BUN: 51 mg/dL — AB (ref 6–23)
CHLORIDE: 108 meq/L (ref 96–112)
CO2: 17 mEq/L — ABNORMAL LOW (ref 19–32)
Calcium: 8.3 mg/dL — ABNORMAL LOW (ref 8.4–10.5)
Creatinine, Ser: 4 mg/dL — ABNORMAL HIGH (ref 0.50–1.10)
GFR calc non Af Amer: 11 mL/min — ABNORMAL LOW (ref >90)
GFR, EST AFRICAN AMERICAN: 12 mL/min — AB (ref >90)
GLUCOSE: 124 mg/dL — AB (ref 70–99)
Potassium: 4.4 mEq/L (ref 3.7–5.3)
SODIUM: 141 meq/L (ref 137–147)
Total Protein: 7.1 g/dL (ref 6.0–8.3)

## 2013-06-19 LAB — GLUCOSE, CAPILLARY
GLUCOSE-CAPILLARY: 161 mg/dL — AB (ref 70–99)
Glucose-Capillary: 125 mg/dL — ABNORMAL HIGH (ref 70–99)
Glucose-Capillary: 128 mg/dL — ABNORMAL HIGH (ref 70–99)
Glucose-Capillary: 160 mg/dL — ABNORMAL HIGH (ref 70–99)

## 2013-06-19 LAB — PHOSPHORUS: Phosphorus: 6.9 mg/dL — ABNORMAL HIGH (ref 2.3–4.6)

## 2013-06-19 LAB — CBC
HEMATOCRIT: 36 % (ref 36.0–46.0)
HEMOGLOBIN: 11.5 g/dL — AB (ref 12.0–15.0)
MCH: 27.3 pg (ref 26.0–34.0)
MCHC: 31.9 g/dL (ref 30.0–36.0)
MCV: 85.5 fL (ref 78.0–100.0)
Platelets: 136 10*3/uL — ABNORMAL LOW (ref 150–400)
RBC: 4.21 MIL/uL (ref 3.87–5.11)
RDW: 16.7 % — ABNORMAL HIGH (ref 11.5–15.5)
WBC: 6.6 10*3/uL (ref 4.0–10.5)

## 2013-06-19 LAB — PARATHYROID HORMONE, INTACT (NO CA): PTH: 458.5 pg/mL — ABNORMAL HIGH (ref 14.0–72.0)

## 2013-06-19 MED ORDER — INSULIN ASPART 100 UNIT/ML ~~LOC~~ SOLN
0.0000 [IU] | Freq: Three times a day (TID) | SUBCUTANEOUS | Status: DC
  Administered 2013-06-20: 1 [IU] via SUBCUTANEOUS
  Administered 2013-06-20 – 2013-06-21 (×2): 2 [IU] via SUBCUTANEOUS
  Administered 2013-06-21 – 2013-06-22 (×2): 1 [IU] via SUBCUTANEOUS
  Administered 2013-06-22: 2 [IU] via SUBCUTANEOUS
  Administered 2013-06-23: 1 [IU] via SUBCUTANEOUS
  Administered 2013-06-26 (×2): 2 [IU] via SUBCUTANEOUS
  Administered 2013-06-27: 7 [IU] via SUBCUTANEOUS
  Administered 2013-06-27: 2 [IU] via SUBCUTANEOUS
  Administered 2013-06-28: 5 [IU] via SUBCUTANEOUS
  Administered 2013-06-28: 2 [IU] via SUBCUTANEOUS
  Administered 2013-06-28: 1 [IU] via SUBCUTANEOUS
  Administered 2013-06-29: 2 [IU] via SUBCUTANEOUS
  Administered 2013-06-30: 3 [IU] via SUBCUTANEOUS
  Administered 2013-06-30: 2 [IU] via SUBCUTANEOUS
  Administered 2013-06-30 – 2013-07-01 (×2): 3 [IU] via SUBCUTANEOUS

## 2013-06-19 MED ORDER — CEFUROXIME AXETIL 500 MG PO TABS
500.0000 mg | ORAL_TABLET | ORAL | Status: DC
  Administered 2013-06-19 – 2013-06-25 (×7): 500 mg via ORAL
  Filled 2013-06-19 (×8): qty 1

## 2013-06-19 MED ORDER — INSULIN ASPART 100 UNIT/ML ~~LOC~~ SOLN
0.0000 [IU] | Freq: Every day | SUBCUTANEOUS | Status: DC
  Administered 2013-06-22: 1 [IU] via SUBCUTANEOUS
  Administered 2013-06-22 – 2013-06-23 (×3): 2 [IU] via SUBCUTANEOUS

## 2013-06-19 MED ORDER — CEFUROXIME AXETIL 500 MG PO TABS
500.0000 mg | ORAL_TABLET | ORAL | Status: DC
  Filled 2013-06-19: qty 1

## 2013-06-19 NOTE — Progress Notes (Signed)
VASCULAR LAB PRELIMINARY  ARTERIAL  ABI completed:    RIGHT    LEFT    PRESSURE WAVEFORM  PRESSURE WAVEFORM  BRACHIAL 147 Tri BRACHIAL 137 Tri  DP   DP    AT 144 Bi AT 80 Mono  PT 154 Bi PT 67 Damp mono   PER   PER    GREAT TOE  NA GREAT TOE  NA    RIGHT LEFT  ABI 1.05, within normal limits 0.54, moderate disease     Farrel Demark, RDMS, RVT  06/19/2013, 9:36 AM

## 2013-06-19 NOTE — Evaluation (Signed)
Physical Therapy Evaluation Patient Details Name: Heidi HoppingMary I Emme MRN: [id number] DOB: Mar 18, 1946 Today's Date: 06/19/2013 Time: [id number] PT Time Calculation (min): 17 min  PT Assessment / Plan / Recommendation History of Present Illness  68 y.o. female [facility] with with significant prior vascular history including peripheral vascular disease status post femoropopliteal bypass with claudication, coronary artery disease status post multiple MIs status post CABG, CVA, type 2 diabetes, chronic renal insufficiency with a baseline creatinine of 2.7 presenting with cellulitis and acute kidney injury. Per the patient she noticed a small on the left anterior shin about 7-10 days ago. Of note, patient had a very extended hospitalization for essentially the entire month of October for nonhealing right groin wound.  Clinical Impression  Pt moving well with her use of the RW.  She shows signs of being weak and deconditioned with limited activity tolerance.  She would benefit from HHPT, but declines politely stating that "they really didn't do much for me last time".  She has all equipment needed at this time at home.   PT to follow acutely for deficits listed below.       PT Assessment  Patient needs continued PT services    Follow Up Recommendations  Home health PT;Supervision - Intermittent (pt politely declines HHPT stating that "they really didn't h)    Does the patient have the potential to tolerate intense rehabilitation     NA  Barriers to Discharge   None      Equipment Recommendations  None recommended by PT    Recommendations for Other Services   NA  Frequency Min 3X/week    Precautions / Restrictions   None  Pertinent Vitals/Pain O2 sats 95% on RA despite 2/4 DOE with gait.       Mobility  Bed Mobility Overal bed mobility: Modified Independent General bed mobility comments: HOB elevated, pt using rail for leverage Transfers Overall transfer level: Needs  assistance Equipment used: Rolling walker (2 wheeled) Transfers: Sit to/from Stand Sit to Stand: Min guard General transfer comment: min guard assist to steady pt for balance during transitions.  Ambulation/Gait Ambulation/Gait assistance: Min guard Ambulation Distance (Feet): 75 Feet Assistive device: Rolling walker (2 wheeled) Gait Pattern/deviations: Step-through pattern;Shuffle;Trunk flexed Gait velocity: decreased Gait velocity interpretation: Below normal speed for age/gender General Gait Details: shuffling, flexed gait pattern.  Pt fatigues quickly and rests her forearms bent over on her RW.          PT Diagnosis: Difficulty walking;Abnormality of gait;Generalized weakness  PT Problem List: Decreased strength;Decreased activity tolerance;Decreased balance;Decreased mobility;Decreased knowledge of use of DME;Cardiopulmonary status limiting activity PT Treatment Interventions: DME instruction;Gait training;Stair training;Functional mobility training;Therapeutic activities;Therapeutic exercise;Balance training;Patient/family education     PT Goals(Current goals can be found in the care plan section) Acute Rehab PT Goals Patient Stated Goal: to go home PT Goal Formulation: With patient Time For Goal Achievement: 07/03/13 Potential to Achieve Goals: Good  Visit Information  Last PT Received On: 06/19/13 Assistance Needed: +1 History of Present Illness: 68 [facility]. [facility] with with significant prior vascular history including peripheral vascular disease status post femoropopliteal bypass with claudication, coronary artery disease status post multiple MIs status post CABG, CVA, type 2 diabetes, chronic renal insufficiency with a baseline creatinine of 2.7 presenting with cellulitis and acute kidney injury. Per the patient she noticed a small on the left anterior shin about 7-10 days ago. Of note, patient had a very extended hospitalization for essentially the  entire month of October for nonhealing right groin wound.       Prior Functioning  Home Living Family/patient expects to be discharged to:: Private residence Living Arrangements: Other relatives (granddaughter who works 2:30-11pm shift) Available Help at Discharge: Family;Available PRN/intermittently Type of Home: House Home Access: Stairs to enter [employer] of Steps: 4 Entrance Stairs-Rails: Right Home Layout: One level Home Equipment: Cane - single point;Walker - 2 wheels Additional Comments: Pt stated she used cane outdoors Prior Function Level of Independence: Needs assistance Gait / Transfers Assistance Needed: per pt she was not using an assistive device for gait inside.  She feels she needs to use a RW now.  ADL's / Homemaking Assistance Needed: pt reports total independence with this Communication / Swallowing Assistance Needed: NA Communication Communication: No difficulties Dominant Hand: Right    Cognition  Cognition Arousal/Alertness: Awake/alert Behavior During Therapy: WFL for tasks assessed/performed Overall Cognitive Status: Within Functional Limits for tasks assessed    Extremity/Trunk Assessment Upper Extremity Assessment Upper Extremity Assessment: Generalized weakness Lower Extremity Assessment Lower Extremity Assessment: Generalized weakness Cervical / Trunk Assessment Cervical / Trunk Assessment: Normal      End of Session PT - End of Session Activity Tolerance: Patient limited by fatigue Patient left: in chair;with call bell/phone within reach    [place] B. Constance Hackenberg, PT, DPT [phone]   06/19/2013, 4:56 PM

## 2013-06-19 NOTE — Progress Notes (Signed)
Subjective:  Events noted- renal ultrasound only remarkable for increased echogenicity and size differentiation (c/w medical renal dz) U/A did show evidence of UTI- cx pending- Hosp was able to find out sensitivities of culture as OP-  obviously was not sensitive to abx given for cellulitis (cipro and doxy)- has been changed to ceftin and doxy.  Seemingly making good urine, creatinine up slightly but essentially stable since admit. But was 2 about 4 months ago  Objective Vital signs in last 24 hours: Filed Vitals:   06/18/13 2107 06/18/13 2247 06/19/13 0500 06/19/13 1009  BP: 137/81  130/83 147/80  Pulse: 94  95 94  Temp: 98.4 F (36.9 C)  98.4 F (36.9 C) 97.9 F (36.6 C)  TempSrc: Oral  Oral Oral  Resp: 22  24 20   Height:      Weight: 80.151 kg (176 lb 11.2 oz)     SpO2: 90% 91% 92% 96%   Weight change: 3.45 kg (7 lb 9.7 oz)  Intake/Output Summary (Last 24 hours) at 06/19/13 1057 Last data filed at 06/19/13 0516  Gross per 24 hour  Intake    720 ml  Output      0 ml  Net    720 ml    Assessment/ Plan: Pt is a 68 y.o. yo female who was admitted on 06/16/2013 with  A on CRF in the setting of cellulitis and UTI  Assessment/Plan: 1. Renal- A on CRF but has been stable since admit.  Suspect either progression of CKD or acute due to mostly UTI/hemodynamic insult. Patient reports fatigue and nausea really sine discharge from the hospital so seems like has been having uremic symptoms.   Is volume overloaded so fluids have been stopped and lasix started. I would like to see if diuresis and treatment of UTI leads to any improvement in renal function.  If it does not, we will need to discuss dialysis and get a permanent access in this hospitalization.   2. HTN/vol- overloaded, lasix restarted.   On no other meds  3. Anemia- acceptable level, no esa needed  4. Secondary hyperparathyroidism- PTH pending 5. ID- now on ceftin (for UTI culture as OP) and doxy- cellulitis resolved, UTI still  present    Laberta Wilbon A    Labs: Basic Metabolic Panel:  Recent Labs Lab 06/17/13 0335 06/18/13 0432 06/19/13 0612  NA 142 139 141  141  K 3.4* 4.1 4.4  4.4  CL 105 106 108  108  CO2 17* 17* 17*  17*  GLUCOSE 99 179* 125*  124*  BUN 50* 52* 50*  51*  CREATININE 3.77* 3.87* 4.01*  4.00*  CALCIUM 8.2* 8.2* 8.2*  8.3*  PHOS  --   --  6.8*  6.9*   Liver Function Tests:  Recent Labs Lab 06/17/13 0335 06/19/13 0612  AST 15 14  ALT 17 15  ALKPHOS 90 87  BILITOT 0.4 0.3  PROT 7.2 7.1  ALBUMIN 2.8* 2.8*  2.8*   No results found for this basename: LIPASE, AMYLASE,  in the last 168 hours No results found for this basename: AMMONIA,  in the last 168 hours CBC:  Recent Labs Lab 06/16/13 1623 06/16/13 1915 06/19/13 0612  WBC 7.6 6.6 6.6  NEUTROABS 5.2 4.4  --   HGB 11.9* 11.8* 11.5*  HCT 35.2* 35.4* 36.0  MCV 82.1 81.4 85.5  PLT 136* 127* 136*   Cardiac Enzymes: No results found for this basename: CKTOTAL, CKMB, CKMBINDEX, TROPONINI,  in the last 168  hours CBG:  Recent Labs Lab 06/18/13 0746 06/18/13 1156 06/18/13 1650 06/18/13 2106 06/19/13 0742  GLUCAP 153* 164* 137* 134* 125*    Iron Studies: No results found for this basename: IRON, TIBC, TRANSFERRIN, FERRITIN,  in the last 72 hours Studies/Results: Dg Chest 2 View  06/18/2013   CLINICAL DATA:  Shortness of breath, history of CHF  EXAM: CHEST  2 VIEW  COMPARISON:  DG CHEST 1V PORT dated 02/11/2013  FINDINGS: Low lung volumes. Cardiac silhouette is enlarged. Patient is status post median sternotomy coronary artery bypass grafting. There is diffuse prominence of the interstitial markings and mild peribronchial cuffing. Areas of increased density projects within the lung bases. No focal regions of consolidation appreciated. There is blunting of the costophrenic angles. The osseous structures unremarkable.  IMPRESSION: Interstitial findings likely representing pulmonary edema. A component of  chronic bronchitic changes also a diagnostic consideration. Bibasilar infiltrate versus atelectasis. Surveillance evaluation recommended.   Electronically Signed   By: [name] M.D.   On: 06/18/2013 18:16   US Renal  06/18/2013   CLINICAL DATA:  An elevated creatinine. History of chronic renal disease  EXAM: RENAL/URINARY TRACT ULTRASOUND COMPLETE  COMPARISON:  DG ABD PORTABLE 1V dated 02/12/2013; US RENAL dated 02/09/2013  FINDINGS: Right Kidney:  Length: 9.1 cm. There is decreased corticomedullary differentiation. A small benign-appearing 0.7 x 0.4 x 0.7 cm cyst is identified within the midpole periphery the right kidney. There is no evidence of solid-appearing masses, hydronephrosis nor calculi.  Left Kidney:  Length: 11.9 cm. There is decreased corticomedullary differentiation. There is no evidence of hydronephrosis, solid or cystic masses, nor calculi.  Bladder:  Urinary bladder is decompressed.  IMPRESSION: Increased cortical echogenicity bilaterally. Cysts consistent with patient's history of chronic renal disease. There is no evidence of obstructive uropathy no calculi. Small benign-appearing cyst is appreciated within the midpole region right kidney.   Electronically Signed   By: [name] M.D.   On: 06/18/2013 18:29   Medications: Infusions:    Scheduled Medications: . atorvastatin  80 mg Oral q1800  . budesonide-formoterol  2 puff Inhalation BID  . cefUROXime  500 mg Oral Q24H  . clopidogrel  75 mg Oral Q breakfast  . doxycycline  100 mg Oral Q12H  . DULoxetine  60 mg Oral Daily  . furosemide  160 mg Oral TID  . heparin  5,000 Units Subcutaneous 3 times per day  . insulin aspart  0-5 Units Subcutaneous QHS  . insulin aspart  0-9 Units Subcutaneous TID WC  . insulin glargine  28 Units Subcutaneous QHS  . ketoconazole   Topical BID  . pantoprazole  40 mg Oral Daily  . pregabalin  50 mg Oral BID  . sodium chloride  3 mL Intravenous Q12H    have reviewed scheduled and prn  medications.  Physical Exam: General: sitting up in bedside chair, NAD but describes mild uremic symptoms Heart:  RRR Lungs: mostly clear Abdomen: soft, non tender Extremities: pitting edema    06/19/2013,10:57 AM  LOS: 3 days

## 2013-06-19 NOTE — Progress Notes (Signed)
PROGRESS NOTE  [name] ZOX:[id number] DOB: [birth year] DOA: 06/16/2013 PCP: [name], [name], MD  Assessment/Plan:   Cellulitis: Improving. Change to PO abx.  UTI- E coli resistant to FQ- outpatient study. Sensitive to rocephin. Asked that cx be faxed.  Changed cipro to ceftin. Cont doxy for cellulitis    Acute renal failure:  -Likely due to acute infection and prerenal state, but creatinine not improving with IVF and holding demadex. -Patient reports that she has been taking more torsemide than what is prescribed.  -gentle hydration, Demadex held. Creatinine last year was over 2. Sees Dr. Marisue [name]. Continue current, consult nephrology for recs.  -Watch for CHF, as EF 15 - 20%    HTN (hypertension)    Chronic systolic congestive heart failure, compensated    PVD (peripheral vascular disease) with claudication: ABI shows right WNL and left with moderate disease- can follow up outpatient with vascular    Chronic kidney disease (CKD), stage 3-4  Tinea pedis: Antifungals topical  Venous stasis dermatitis  Hyperlipidemia  DM- HgbA1C- 7.9 -SSI TID and QHS   HPI/Subjective: No SOB No fever, no chills  Objective: Filed Vitals:   06/19/13 0500  BP: 130/83  Pulse: 95  Temp: 98.4 F (36.9 C)  Resp: 24    Intake/Output Summary (Last 24 hours) at 06/19/13 0952 Last data filed at 06/19/13 0516  Gross per 24 hour  Intake    720 ml  Output      0 ml  Net    720 ml   Filed Weights   06/16/13 2032 06/17/13 2100 06/18/13 2107  Weight: 76.522 kg (168 lb 11.2 oz) 76.7 kg (169 lb 1.5 oz) 80.151 kg (176 lb 11.2 oz)    Exam:   General:  Nontoxic.  Cardiovascular: Regular rate rhythm without murmurs gallops rubs  Respiratory: Clear to auscultation bilaterally without wheezes rhonchi or rales  Abdomen: Soft nontender nondistended  Ext: Ulceration noted bilateral pretibial areas. Erythema mild from left foot to mid calf improving. Both legs hyperpigmented. No  pitting edema.  Basic Metabolic Panel:  Recent Labs Lab 06/16/13 1623 06/16/13 1915 06/17/13 0335 06/18/13 0432 06/19/13 0612  NA 143  --  142 139 141  141  K 3.6*  --  3.4* 4.1 4.4  4.4  CL 107  --  105 106 108  108  CO2 19  --  17* 17* 17*  17*  GLUCOSE 108*  --  99 179* 125*  124*  BUN 51*  --  50* 52* 50*  51*  CREATININE 3.77* 3.73* 3.77* 3.87* 4.01*  4.00*  CALCIUM 8.3*  --  8.2* 8.2* 8.2*  8.3*  PHOS  --   --   --   --  6.8*  6.9*   Liver Function Tests:  Recent Labs Lab 06/17/13 0335 06/19/13 0612  AST 15 14  ALT 17 15  ALKPHOS 90 87  BILITOT 0.4 0.3  PROT 7.2 7.1  ALBUMIN 2.8* 2.8*  2.8*   No results found for this basename: LIPASE, AMYLASE,  in the last 168 hours No results found for this basename: AMMONIA,  in the last 168 hours CBC:  Recent Labs Lab 06/16/13 1623 06/16/13 1915 06/19/13 0612  WBC 7.6 6.6 6.6  NEUTROABS 5.2 4.4  --   HGB 11.9* 11.8* 11.5*  HCT 35.2* 35.4* 36.0  MCV 82.1 81.4 85.5  PLT 136* 127* 136*   Cardiac Enzymes: No results found for this basename: CKTOTAL, CKMB, CKMBINDEX, TROPONINI,  in  the last 168 hours BNP (last 3 results)  Recent Labs  09/25/12 1743 09/29/12 0530  PROBNP 16961.0* 5247.0*   CBG:  Recent Labs Lab 06/18/13 0746 06/18/13 1156 06/18/13 1650 06/18/13 2106 06/19/13 0742  GLUCAP 153* 164* 137* 134* 125*    Recent Results (from the past 240 hour(s))  CULTURE, BLOOD (ROUTINE X 2)     Status: None   Collection Time    06/16/13  4:43 PM      Result Value Ref Range Status   Specimen Description BLOOD ARM LEFT   Final   Special Requests BOTTLES DRAWN AEROBIC AND ANAEROBIC 5CCBLUE 4CCRED   Final   Culture  Setup Time     Final   Value: 06/16/2013 22:10     Performed at [facility]   Culture     Final   Value:        BLOOD CULTURE RECEIVED NO GROWTH TO DATE CULTURE WILL BE HELD FOR 5 DAYS BEFORE ISSUING A FINAL NEGATIVE REPORT     Performed at [facility]   Report  Status PENDING   Incomplete  CULTURE, BLOOD (ROUTINE X 2)     Status: None   Collection Time    06/16/13  4:53 PM      Result Value Ref Range Status   Specimen Description BLOOD ARM RIGHT   Final   Special Requests BOTTLES DRAWN AEROBIC AND ANAEROBIC 10CC   Final   Culture  Setup Time     Final   Value: 06/16/2013 22:10     Performed at [facility]   Culture     Final   Value:        BLOOD CULTURE RECEIVED NO GROWTH TO DATE CULTURE WILL BE HELD FOR 5 DAYS BEFORE ISSUING A FINAL NEGATIVE REPORT     Performed at [facility]   Report Status PENDING   Incomplete     Studies: Dg Chest 2 View  06/18/2013   CLINICAL DATA:  Shortness of breath, history of CHF  EXAM: CHEST  2 VIEW  COMPARISON:  DG CHEST 1V PORT dated 02/11/2013  FINDINGS: Low lung volumes. Cardiac silhouette is enlarged. Patient is status post median sternotomy coronary artery bypass grafting. There is diffuse prominence of the interstitial markings and mild peribronchial cuffing. Areas of increased density projects within the lung bases. No focal regions of consolidation appreciated. There is blunting of the costophrenic angles. The osseous structures unremarkable.  IMPRESSION: Interstitial findings likely representing pulmonary edema. A component of chronic bronchitic changes also a diagnostic consideration. Bibasilar infiltrate versus atelectasis. Surveillance evaluation recommended.   Electronically Signed   By: [name] M.D.   On: 06/18/2013 18:16   US Renal  06/18/2013   CLINICAL DATA:  An elevated creatinine. History of chronic renal disease  EXAM: RENAL/URINARY TRACT ULTRASOUND COMPLETE  COMPARISON:  DG ABD PORTABLE 1V dated 02/12/2013; US RENAL dated 02/09/2013  FINDINGS: Right Kidney:  Length: 9.1 cm. There is decreased corticomedullary differentiation. A small benign-appearing 0.7 x 0.4 x 0.7 cm cyst is identified within the midpole periphery the right kidney. There is no evidence of solid-appearing  masses, hydronephrosis nor calculi.  Left Kidney:  Length: 11.9 cm. There is decreased corticomedullary differentiation. There is no evidence of hydronephrosis, solid or cystic masses, nor calculi.  Bladder:  Urinary bladder is decompressed.  IMPRESSION: Increased cortical echogenicity bilaterally. Cysts consistent with patient's history of chronic renal disease. There is no evidence of obstructive uropathy no calculi. Small  benign-appearing cyst is appreciated within the midpole region right kidney.   Electronically Signed   By: [name] M.D.   On: 06/18/2013 18:29    Scheduled Meds: . atorvastatin  80 mg Oral q1800  . budesonide-formoterol  2 puff Inhalation BID  . cefUROXime  500 mg Oral Q24H  . clopidogrel  75 mg Oral Q breakfast  . doxycycline  100 mg Oral Q12H  . DULoxetine  60 mg Oral Daily  . furosemide  160 mg Oral TID  . heparin  5,000 Units Subcutaneous 3 times per day  . insulin aspart  0-9 Units Subcutaneous 6 times per day  . insulin glargine  28 Units Subcutaneous QHS  . ketoconazole   Topical BID  . pantoprazole  40 mg Oral Daily  . pregabalin  50 mg Oral BID  . sodium chloride  3 mL Intravenous Q12H   Continuous Infusions:    Time spent: 25 minutes  Oneta Sigman  [facility] Pager [phone]. If 7PM-7AM, please contact night-coverage at [email or web address], password [email or web address] 06/19/2013, 9:52 AM  LOS: 3 days

## 2013-06-20 DIAGNOSIS — I739 Peripheral vascular disease, unspecified: Secondary | ICD-10-CM

## 2013-06-20 LAB — URINE CULTURE: Colony Count: >=100000

## 2013-06-20 LAB — RENAL FUNCTION PANEL
Albumin: 2.6 g/dL — ABNORMAL LOW (ref 3.5–5.2)
BUN: 52 mg/dL — AB (ref 6–23)
CALCIUM: 8.3 mg/dL — AB (ref 8.4–10.5)
CHLORIDE: 106 meq/L (ref 96–112)
CO2: 18 mEq/L — ABNORMAL LOW (ref 19–32)
CREATININE: 4 mg/dL — AB (ref 0.50–1.10)
GFR calc Af Amer: 12 mL/min — ABNORMAL LOW (ref >90)
GFR, EST NON AFRICAN AMERICAN: 11 mL/min — AB (ref >90)
Glucose, Bld: 112 mg/dL — ABNORMAL HIGH (ref 70–99)
PHOSPHORUS: 7.1 mg/dL — AB (ref 2.3–4.6)
Potassium: 5 mEq/L (ref 3.7–5.3)
Sodium: 139 mEq/L (ref 137–147)

## 2013-06-20 LAB — GLUCOSE, CAPILLARY
GLUCOSE-CAPILLARY: 169 mg/dL — AB (ref 70–99)
Glucose-Capillary: 98 mg/dL (ref 70–99)
Glucose-Capillary: 168 mg/dL — ABNORMAL HIGH (ref 70–99)
Glucose-Capillary: 149 mg/dL — ABNORMAL HIGH (ref 70–99)

## 2013-06-20 MED ORDER — CALCITRIOL 0.25 MCG PO CAPS
0.2500 ug | ORAL_CAPSULE | Freq: Every day | ORAL | Status: DC
  Administered 2013-06-20 – 2013-07-01 (×11): 0.25 ug via ORAL
  Filled 2013-06-20 (×13): qty 1

## 2013-06-20 MED ORDER — CALCIUM ACETATE 667 MG PO CAPS
1334.0000 mg | ORAL_CAPSULE | Freq: Three times a day (TID) | ORAL | Status: DC
  Administered 2013-06-20 – 2013-07-01 (×27): 1334 mg via ORAL
  Filled 2013-06-20 (×39): qty 2

## 2013-06-20 MED ORDER — FUROSEMIDE 80 MG PO TABS
160.0000 mg | ORAL_TABLET | Freq: Two times a day (BID) | ORAL | Status: DC
  Administered 2013-06-20 – 2013-06-22 (×4): 160 mg via ORAL
  Filled 2013-06-20 (×6): qty 2

## 2013-06-20 NOTE — Progress Notes (Signed)
PROGRESS NOTE  [name] ZOX:[id number]:[id number] [name] DOB: 09-21-1945 DOA: 06/16/2013 PCP: Heidi JosephsMorrow, [name], MD  Assessment/Plan:   Cellulitis: Improving. Change to PO abx.  UTI- E coli resistant to FQ- outpatient study. Sensitive to rocephin. Asked that cx be faxed.  Changed cipro to ceftin. Cont doxy for cellulitis    Acute renal failure:  -Likely due to acute infection and prerenal state, but creatinine not improving with IVF and holding demadex. -Patient reports that she has been taking more torsemide than what is prescribed.  -gentle hydration, Demadex held. Creatinine last year was over 2. Sees Dr. Marisue [name]. Continue current, consult nephrology for recs.  -Watch for CHF, as EF 15 - 20%    HTN (hypertension)    Chronic systolic congestive heart failure, compensated    PVD (peripheral vascular disease) with claudication: ABI shows right WNL and left with moderate disease- can follow up outpatient with vascular    Chronic kidney disease (CKD), stage 3-4  Tinea pedis: Antifungals topical  Venous stasis dermatitis  Hyperlipidemia  DM- HgbA1C- 7.9 -SSI TID and QHS   HPI/Subjective: Patient says she is under a lot of stress  Objective: Filed Vitals:   06/20/13 0857  BP: 146/73  Pulse: 93  Temp: 97.7 F (36.5 C)  Resp: 21    Intake/Output Summary (Last 24 hours) at 06/20/13 1127 Last data filed at 06/20/13 0859  Gross per 24 hour  Intake    480 ml  Output      2 ml  Net    478 ml   Filed Weights   06/17/13 2100 06/18/13 2107 06/19/13 2206  Weight: 76.7 kg (169 lb 1.5 oz) 80.151 kg (176 lb 11.2 oz) 81.421 kg (179 lb 8 oz)    Exam:   General:  Nontoxic.  Cardiovascular: Regular rate rhythm without murmurs gallops rubs  Respiratory: Clear to auscultation bilaterally without wheezes rhonchi or rales  Abdomen: Soft nontender nondistended  Ext: Ulceration noted bilateral pretibial areas. Erythema mild from left foot to mid calf improving. Both legs  hyperpigmented. No pitting edema.  Basic Metabolic Panel:  Recent Labs Lab 06/16/13 1623 06/16/13 1915 06/17/13 0335 06/18/13 0432 06/19/13 0612 06/20/13 0400  NA 143  --  142 139 141  141 139  K 3.6*  --  3.4* 4.1 4.4  4.4 5.0  CL 107  --  105 106 108  108 106  CO2 19  --  17* 17* 17*  17* 18*  GLUCOSE 108*  --  99 179* 125*  124* 112*  BUN 51*  --  50* 52* 50*  51* 52*  CREATININE 3.77* 3.73* 3.77* 3.87* 4.01*  4.00* 4.00*  CALCIUM 8.3*  --  8.2* 8.2* 8.2*  8.3* 8.3*  PHOS  --   --   --   --  6.8*  6.9* 7.1*   Liver Function Tests:  Recent Labs Lab 06/17/13 0335 06/19/13 0612 06/20/13 0400  AST 15 14  --   ALT 17 15  --   ALKPHOS 90 87  --   BILITOT 0.4 0.3  --   PROT 7.2 7.1  --   ALBUMIN 2.8* 2.8*  2.8* 2.6*   No results found for this basename: LIPASE, AMYLASE,  in the last 168 hours No results found for this basename: AMMONIA,  in the last 168 hours CBC:  Recent Labs Lab 06/16/13 1623 06/16/13 1915 06/19/13 0612  WBC 7.6 6.6 6.6  NEUTROABS 5.2 4.4  --   HGB 11.9* 11.8*  11.5*  HCT 35.2* 35.4* 36.0  MCV 82.1 81.4 85.5  PLT 136* 127* 136*   Cardiac Enzymes: No results found for this basename: CKTOTAL, CKMB, CKMBINDEX, TROPONINI,  in the last 168 hours BNP (last 3 results)  Recent Labs  09/25/12 1743 09/29/12 0530  PROBNP 16961.0* 5247.0*   CBG:  Recent Labs Lab 06/19/13 0742 06/19/13 1135 06/19/13 1657 06/19/13 2039 06/20/13 0737  GLUCAP 125* 161* 128* 160* 98    Recent Results (from the past 240 hour(s))  CULTURE, BLOOD (ROUTINE X 2)     Status: None   Collection Time    06/16/13  4:43 PM      Result Value Ref Range Status   Specimen Description BLOOD ARM LEFT   Final   Special Requests BOTTLES DRAWN AEROBIC AND ANAEROBIC 5CCBLUE 4CCRED   Final   Culture  Setup Time     Final   Value: 06/16/2013 22:10     Performed at [facility]   Culture     Final   Value:        BLOOD CULTURE RECEIVED NO GROWTH TO DATE  CULTURE WILL BE HELD FOR 5 DAYS BEFORE ISSUING A FINAL NEGATIVE REPORT     Performed at [facility]   Report Status PENDING   Incomplete  CULTURE, BLOOD (ROUTINE X 2)     Status: None   Collection Time    06/16/13  4:53 PM      Result Value Ref Range Status   Specimen Description BLOOD ARM RIGHT   Final   Special Requests BOTTLES DRAWN AEROBIC AND ANAEROBIC 10CC   Final   Culture  Setup Time     Final   Value: 06/16/2013 22:10     Performed at [facility]   Culture     Final   Value:        BLOOD CULTURE RECEIVED NO GROWTH TO DATE CULTURE WILL BE HELD FOR 5 DAYS BEFORE ISSUING A FINAL NEGATIVE REPORT     Performed at [facility]   Report Status PENDING   Incomplete  URINE CULTURE     Status: None   Collection Time    06/18/13  6:34 PM      Result Value Ref Range Status   Specimen Description URINE, RANDOM   Final   Special Requests NONE   Final   Culture  Setup Time     Final   Value: 06/18/2013 19:27     Performed at [facility] Count     Final   Value: >=100,000 COLONIES/ML     Performed at [facility]   Culture     Final   Value: ESCHERICHIA COLI     Performed at [facility]   Report Status 06/20/2013 FINAL   Final   Organism ID, Bacteria ESCHERICHIA COLI   Final     Studies: Dg Chest 2 View  06/18/2013   CLINICAL DATA:  Shortness of breath, history of CHF  EXAM: CHEST  2 VIEW  COMPARISON:  DG CHEST 1V PORT dated 02/11/2013  FINDINGS: Low lung volumes. Cardiac silhouette is enlarged. Patient is status post median sternotomy coronary artery bypass grafting. There is diffuse prominence of the interstitial markings and mild peribronchial cuffing. Areas of increased density projects within the lung bases. No focal regions of consolidation appreciated. There is blunting of the costophrenic angles. The osseous structures unremarkable.  IMPRESSION: Interstitial findings likely representing pulmonary edema. A component  of  chronic bronchitic changes also a diagnostic consideration. Bibasilar infiltrate versus atelectasis. Surveillance evaluation recommended.   Electronically Signed   By: [name] M.D.   On: 06/18/2013 18:16   US Renal  06/18/2013   CLINICAL DATA:  An elevated creatinine. History of chronic renal disease  EXAM: RENAL/URINARY TRACT ULTRASOUND COMPLETE  COMPARISON:  DG ABD PORTABLE 1V dated 02/12/2013; US RENAL dated 02/09/2013  FINDINGS: Right Kidney:  Length: 9.1 cm. There is decreased corticomedullary differentiation. A small benign-appearing 0.7 x 0.4 x 0.7 cm cyst is identified within the midpole periphery the right kidney. There is no evidence of solid-appearing masses, hydronephrosis nor calculi.  Left Kidney:  Length: 11.9 cm. There is decreased corticomedullary differentiation. There is no evidence of hydronephrosis, solid or cystic masses, nor calculi.  Bladder:  Urinary bladder is decompressed.  IMPRESSION: Increased cortical echogenicity bilaterally. Cysts consistent with patient's history of chronic renal disease. There is no evidence of obstructive uropathy no calculi. Small benign-appearing cyst is appreciated within the midpole region right kidney.   Electronically Signed   By: [name] M.D.   On: 06/18/2013 18:29    Scheduled Meds: . atorvastatin  80 mg Oral q1800  . budesonide-formoterol  2 puff Inhalation BID  . calcitRIOL  0.25 mcg Oral Daily  . calcium acetate  1,334 mg Oral TID WC  . cefUROXime  500 mg Oral Q24H  . clopidogrel  75 mg Oral Q breakfast  . doxycycline  100 mg Oral Q12H  . DULoxetine  60 mg Oral Daily  . furosemide  160 mg Oral BID  . heparin  5,000 Units Subcutaneous 3 times per day  . insulin aspart  0-5 Units Subcutaneous QHS  . insulin aspart  0-9 Units Subcutaneous TID WC  . insulin glargine  28 Units Subcutaneous QHS  . ketoconazole   Topical BID  . pantoprazole  40 mg Oral Daily  . pregabalin  50 mg Oral BID  . sodium chloride  3 mL Intravenous  Q12H   Continuous Infusions:    Time spent: 25 minutes  Adriane Guglielmo  [facility] Pager [phone]. If 7PM-7AM, please contact night-coverage at [email or web address], password [facility] 06/20/2013, 11:27 AM  LOS: 4 days

## 2013-06-20 NOTE — Progress Notes (Signed)
Subjective:  No new complaints- said that she made a lot of urine but is not recorded.  We think is on appropriate antibiotic for UTI.  Still kind of failing to thrive but says stomach is better.   Objective Vital signs in last 24 hours: Filed Vitals:   06/19/13 2036 06/19/13 2206 06/20/13 0550 06/20/13 0857  BP: 142/80  131/67 146/73  Pulse: 83  89 93  Temp: 98.3 F (36.8 C)  97.9 F (36.6 C) 97.7 F (36.5 C)  TempSrc: Oral  Oral Oral  Resp: 20  20 21   Height:      Weight:  81.421 kg (179 lb 8 oz)    SpO2: 91%  92% 97%   Weight change: 1.27 kg (2 lb 12.8 oz)  Intake/Output Summary (Last 24 hours) at 06/20/13 1026 Last data filed at 06/20/13 0859  Gross per 24 hour  Intake    480 ml  Output      2 ml  Net    478 ml    Assessment/ Plan: Pt is a 68 y.o. yo female who was admitted on 06/16/2013 with  A on CRF in the setting of cellulitis and UTI  Assessment/Plan: 1. Renal- A on CRF but has been stable since admit.  Suspect either progression of CKD or acute due to mostly UTI/hemodynamic insult. Patient reports fatigue and nausea really since previous discharge from the hospital so seems like has been having uremic symptoms.   Is volume overloaded so fluids have been stopped and lasix started. I would like to see if diuresis and treatment of UTI leads to any improvement in renal function.  It has not seemed to yet.  I had a frank discussion with patient today that if she doesn't perk up by Monday, she is feeling badly enough that initiation of dialysis might be indicated to get her feeling good again- she is appropriately distressed to hear this news.     2. HTN/vol- overloaded, lasix restarted.   Will see if we can get more accurate I's and O's 3. Anemia- acceptable level, no esa needed  4. Secondary hyperparathyroidism- PTH 458, phos 7 start binder and  vitamin D-  5. ID- now on ceftin (for UTI culture as OP) and doxy- cellulitis resolved, UTI still present    Ly Wass  A    Labs: Basic Metabolic Panel:  Recent Labs Lab 06/18/13 0432 06/19/13 0612 06/20/13 0400  NA 139 141  141 139  K 4.1 4.4  4.4 5.0  CL 106 108  108 106  CO2 17* 17*  17* 18*  GLUCOSE 179* 125*  124* 112*  BUN 52* 50*  51* 52*  CREATININE 3.87* 4.01*  4.00* 4.00*  CALCIUM 8.2* 8.2*  8.3* 8.3*  PHOS  --  6.8*  6.9* 7.1*   Liver Function Tests:  Recent Labs Lab 06/17/13 0335 06/19/13 0612 06/20/13 0400  AST 15 14  --   ALT 17 15  --   ALKPHOS 90 87  --   BILITOT 0.4 0.3  --   PROT 7.2 7.1  --   ALBUMIN 2.8* 2.8*  2.8* 2.6*   No results found for this basename: LIPASE, AMYLASE,  in the last 168 hours No results found for this basename: AMMONIA,  in the last 168 hours CBC:  Recent Labs Lab 06/16/13 1623 06/16/13 1915 06/19/13 0612  WBC 7.6 6.6 6.6  NEUTROABS 5.2 4.4  --   HGB 11.9* 11.8* 11.5*  HCT 35.2* 35.4* 36.0  MCV  82.1 81.4 85.5  PLT 136* 127* 136*   Cardiac Enzymes: No results found for this basename: CKTOTAL, CKMB, CKMBINDEX, TROPONINI,  in the last 168 hours CBG:  Recent Labs Lab 06/19/13 0742 06/19/13 1135 06/19/13 1657 06/19/13 2039 06/20/13 0737  GLUCAP 125* 161* 128* 160* 98    Iron Studies: No results found for this basename: IRON, TIBC, TRANSFERRIN, FERRITIN,  in the last 72 hours Studies/Results: Dg Chest 2 View  06/18/2013   CLINICAL DATA:  Shortness of breath, history of CHF  EXAM: CHEST  2 VIEW  COMPARISON:  DG CHEST 1V PORT dated 02/11/2013  FINDINGS: Low lung volumes. Cardiac silhouette is enlarged. Patient is status post median sternotomy coronary artery bypass grafting. There is diffuse prominence of the interstitial markings and mild peribronchial cuffing. Areas of increased density projects within the lung bases. No focal regions of consolidation appreciated. There is blunting of the costophrenic angles. The osseous structures unremarkable.  IMPRESSION: Interstitial findings likely representing pulmonary edema. A  component of chronic bronchitic changes also a diagnostic consideration. Bibasilar infiltrate versus atelectasis. Surveillance evaluation recommended.   Electronically Signed   By: Salome HolmesHector  Cooper M.D.   On: 06/18/2013 18:16   [name]  06/18/2013   CLINICAL DATA:  An elevated creatinine. History of chronic renal disease  EXAM: RENAL/URINARY TRACT ULTRASOUND COMPLETE  COMPARISON:  DG ABD PORTABLE 1V dated 02/12/2013; US RENAL dated 02/09/2013  FINDINGS: Right Kidney:  Length: 9.1 cm. There is decreased corticomedullary differentiation. A small benign-appearing 0.7 x 0.4 x 0.7 cm cyst is identified within the midpole periphery the right kidney. There is no evidence of solid-appearing masses, hydronephrosis nor calculi.  Left Kidney:  Length: 11.9 cm. There is decreased corticomedullary differentiation. There is no evidence of hydronephrosis, solid or cystic masses, nor calculi.  Bladder:  Urinary bladder is decompressed.  IMPRESSION: Increased cortical echogenicity bilaterally. Cysts consistent with patient's history of chronic renal disease. There is no evidence of obstructive uropathy no calculi. Small benign-appearing cyst is appreciated within the midpole region right kidney.   Electronically Signed   By: Salome HolmesHector  Cooper M.D.   On: 06/18/2013 18:29   Medications: Infusions:    Scheduled Medications: . atorvastatin  80 mg Oral q1800  . budesonide-formoterol  2 puff Inhalation BID  . cefUROXime  500 mg Oral Q24H  . clopidogrel  75 mg Oral Q breakfast  . doxycycline  100 mg Oral Q12H  . DULoxetine  60 mg Oral Daily  . furosemide  160 mg Oral TID  . heparin  5,000 Units Subcutaneous 3 times per day  . insulin aspart  0-5 Units Subcutaneous QHS  . insulin aspart  0-9 Units Subcutaneous TID WC  . insulin glargine  28 Units Subcutaneous QHS  . ketoconazole   Topical BID  . pantoprazole  40 mg Oral Daily  . pregabalin  50 mg Oral BID  . sodium chloride  3 mL Intravenous Q12H    have reviewed  scheduled and prn medications.  Physical Exam: General: sitting up in bedside chair, NAD but describes uremic symptoms Heart:  RRR Lungs: mostly clear Abdomen: soft, non tender Extremities: pitting edema    06/20/2013,10:26 AM  LOS: 4 days

## 2013-06-21 LAB — URINALYSIS, ROUTINE W REFLEX MICROSCOPIC
Bilirubin Urine: NEGATIVE
Glucose, UA: NEGATIVE mg/dL
Ketones, ur: NEGATIVE mg/dL
LEUKOCYTES UA: NEGATIVE
Nitrite: NEGATIVE
PROTEIN: NEGATIVE mg/dL
Specific Gravity, Urine: 1.011 (ref 1.005–1.030)
Urobilinogen, UA: 0.2 mg/dL (ref 0.0–1.0)
pH: 5 (ref 5.0–8.0)

## 2013-06-21 LAB — RENAL FUNCTION PANEL
Albumin: 2.8 g/dL — ABNORMAL LOW (ref 3.5–5.2)
BUN: 57 mg/dL — AB (ref 6–23)
CO2: 20 meq/L (ref 19–32)
CREATININE: 4.03 mg/dL — AB (ref 0.50–1.10)
Calcium: 8.6 mg/dL (ref 8.4–10.5)
Chloride: 105 mEq/L (ref 96–112)
GFR calc Af Amer: 12 mL/min — ABNORMAL LOW (ref >90)
GFR calc non Af Amer: 11 mL/min — ABNORMAL LOW (ref >90)
Glucose, Bld: 96 mg/dL (ref 70–99)
Phosphorus: 6.7 mg/dL — ABNORMAL HIGH (ref 2.3–4.6)
Potassium: 4.1 mEq/L (ref 3.7–5.3)
Sodium: 141 mEq/L (ref 137–147)

## 2013-06-21 LAB — GLUCOSE, CAPILLARY
GLUCOSE-CAPILLARY: 131 mg/dL — AB (ref 70–99)
Glucose-Capillary: 102 mg/dL — ABNORMAL HIGH (ref 70–99)
Glucose-Capillary: 172 mg/dL — ABNORMAL HIGH (ref 70–99)
Glucose-Capillary: 139 mg/dL — ABNORMAL HIGH (ref 70–99)

## 2013-06-21 LAB — URINE MICROSCOPIC-ADD ON

## 2013-06-21 NOTE — Progress Notes (Signed)
Subjective:  No new complaints- said that she made [name] lot of urine but is not recorded and weight /exam is not reflecting loss of volume.  Kidney function no better.  She is starting to come to grips with the fact that dialysis may help her Objective Vital signs in last 24 hours: Filed Vitals:   06/20/13 1805 06/20/13 2049 06/21/13 0446 06/21/13 0848  BP: 129/75 138/94 143/80 142/74  Pulse:  93 100 98  Temp: 98 F (36.7 C) 98.4 F (36.9 C) 98.3 F (36.8 C) 97.9 F (36.6 C)  TempSrc: Oral Oral Oral Oral  Resp: 23 22 20 21   Height:  5' (1.524 m)    Weight:  81.647 kg (180 lb)    SpO2: 92% 93% 91% 96%   Weight change: 0.227 kg (8 oz)  Intake/Output Summary (Last 24 hours) at 06/21/13 1038 Last data filed at 06/21/13 0848  Gross per 24 hour  Intake    960 ml  Output      0 ml  Net    960 ml    Assessment/ Plan: Pt is [name] 68 y.o. yo female who was admitted on 06/16/2013 with  [name] on CRF in the setting of cellulitis and UTI  Assessment/Plan: 1. Renal- [name] on CRF but has been stable since admit.  Suspect progression of CKD or acute due to mostly UTI/hemodynamic insult. Patient reports fatigue and nausea really since previous discharge from the hospital so seems like has been having uremic symptoms.   Is volume overloaded so fluids have been stopped and lasix started. So far,  diuresis and treatment of UTI has not lead to any improvement in renal function or clinically.   I had [name] frank discussion with patient again today and she is willing to proceed with dialysis in an effort to get her feeling good again.      2. HTN/vol- overloaded, lasix restarted.   Inaccurate I's and O's- dialysis when initiated will help with this issue 3. Anemia- acceptable level, no esa needed  4. Secondary hyperparathyroidism- PTH 458, phos 7 start binder and  vitamin D-  5. ID- now on ceftin (for UTI culture as OP) and doxy- cellulitis resolved, UTI still present  6. Dispo- will call [facility] in the AM, hoping to place PC  and access at same time and initiate dialysis in the next few days.    Heidi [name]    Labs: Basic Metabolic Panel:  Recent Labs Lab 06/19/13 0612 06/20/13 0400 06/21/13 0610  NA 141  141 139 141  K 4.4  4.4 5.0 4.1  CL 108  108 106 105  CO2 17*  17* 18* 20  GLUCOSE 125*  124* 112* 96  BUN 50*  51* 52* 57*  CREATININE 4.01*  4.00* 4.00* 4.03*  CALCIUM 8.2*  8.3* 8.3* 8.6  PHOS 6.8*  6.9* 7.1* 6.7*   Liver Function Tests:  Recent Labs Lab 06/17/13 0335 06/19/13 0612 06/20/13 0400 06/21/13 0610  AST 15 14  --   --   ALT 17 15  --   --   ALKPHOS 90 87  --   --   BILITOT 0.4 0.3  --   --   PROT 7.2 7.1  --   --   ALBUMIN 2.8* 2.8*  2.8* 2.6* 2.8*   No results found for this basename: LIPASE, AMYLASE,  in the last 168 hours No results found for this basename: AMMONIA,  in the last 168 hours CBC:  Recent Labs Lab  06/16/13 1623 06/16/13 1915 06/19/13 0612  WBC 7.6 6.6 6.6  NEUTROABS 5.2 4.4  --   HGB 11.9* 11.8* 11.5*  HCT 35.2* 35.4* 36.0  MCV 82.1 81.4 85.5  PLT 136* 127* 136*   Cardiac Enzymes: No results found for this basename: CKTOTAL, CKMB, CKMBINDEX, TROPONINI,  in the last 168 hours CBG:  Recent Labs Lab 06/20/13 0737 06/20/13 1125 06/20/13 1657 06/20/13 2230 06/21/13 0800  GLUCAP 98 168* 149* 169* 102*    Iron Studies: No results found for this basename: IRON, TIBC, TRANSFERRIN, FERRITIN,  in the last 72 hours Studies/Results: No results found. Medications: Infusions:    Scheduled Medications: . atorvastatin  80 mg Oral q1800  . budesonide-formoterol  2 puff Inhalation BID  . calcitRIOL  0.25 mcg Oral Daily  . calcium acetate  1,334 mg Oral TID WC  . cefUROXime  500 mg Oral Q24H  . clopidogrel  75 mg Oral Q breakfast  . doxycycline  100 mg Oral Q12H  . DULoxetine  60 mg Oral Daily  . furosemide  160 mg Oral BID  . heparin  5,000 Units Subcutaneous 3 times per day  . insulin aspart  0-5 Units Subcutaneous QHS   . insulin aspart  0-9 Units Subcutaneous TID WC  . insulin glargine  28 Units Subcutaneous QHS  . ketoconazole   Topical BID  . pantoprazole  40 mg Oral Daily  . pregabalin  50 mg Oral BID  . sodium chloride  3 mL Intravenous Q12H    have reviewed scheduled and prn medications.  Physical Exam: General: sitting up in bedside chair, NAD but describes uremic symptoms Heart:  RRR Lungs: mostly clear Abdomen: soft, non tender Extremities: pitting edema    06/21/2013,10:38 AM  LOS: 5 days

## 2013-06-21 NOTE — Progress Notes (Signed)
PROGRESS NOTE  Heidi HoppingMary I [name] ZOX:[id number] DOB: 1945/10/10 DOA: 06/16/2013 PCP: [name], Heidi P, MD  Assessment/Plan:   Cellulitis: Improving. Change to PO abx.  UTI- E coli resistant to FQ- outpatient study. Sensitive to rocephin. Asked that cx be faxed.  Changed cipro to ceftin. Cont doxy for cellulitis    Acute renal failure:  -Likely due to acute infection and prerenal state, but creatinine not improving with IVF and holding demadex. -Patient reports that she has been taking more torsemide than what is prescribed.  -gentle hydration, Demadex held. Creatinine last year was over 2. Sees Dr. Marisue [name]. Continue current, consult nephrology for recs.  -Watch for CHF, as EF 15 - 20%    HTN (hypertension)    Chronic systolic congestive heart failure, compensated    PVD (peripheral vascular disease) with claudication: ABI shows right WNL and left with moderate disease- can follow up outpatient with vascular    Chronic kidney disease (CKD), stage 3-4  Tinea pedis: Antifungals topical  Venous stasis dermatitis  Hyperlipidemia  DM- HgbA1C- 7.9 -SSI TID and QHS   HPI/Subjective: Patient says she is under a lot of stress No CP, no SOB Wants to go home soon   Objective: Filed Vitals:   06/21/13 0848  BP: 142/74  Pulse: 98  Temp: 97.9 F (36.6 C)  Resp: 21    Intake/Output Summary (Last 24 hours) at 06/21/13 0947 Last data filed at 06/21/13 0848  Gross per 24 hour  Intake    960 ml  Output      0 ml  Net    960 ml   Filed Weights   06/18/13 2107 06/19/13 2206 06/20/13 2049  Weight: 80.151 kg (176 lb 11.2 oz) 81.421 kg (179 lb 8 oz) 81.647 kg (180 lb)    Exam:   General:  Nontoxic.  Cardiovascular: Regular rate rhythm without murmurs gallops rubs  Respiratory: Clear to auscultation bilaterally without wheezes rhonchi or rales  Abdomen: Soft nontender nondistended  Ext: Ulceration noted bilateral pretibial areas.  Both legs hyperpigmented. No pitting  edema. Erythema resolved  Basic Metabolic Panel:  Recent Labs Lab 06/17/13 0335 06/18/13 0432 06/19/13 0612 06/20/13 0400 06/21/13 0610  NA 142 139 141  141 139 141  K 3.4* 4.1 4.4  4.4 5.0 4.1  CL 105 106 108  108 106 105  CO2 17* 17* 17*  17* 18* 20  GLUCOSE 99 179* 125*  124* 112* 96  BUN 50* 52* 50*  51* 52* 57*  CREATININE 3.77* 3.87* 4.01*  4.00* 4.00* 4.03*  CALCIUM 8.2* 8.2* 8.2*  8.3* 8.3* 8.6  PHOS  --   --  6.8*  6.9* 7.1* 6.7*   Liver Function Tests:  Recent Labs Lab 06/17/13 0335 06/19/13 0612 06/20/13 0400 06/21/13 0610  AST 15 14  --   --   ALT 17 15  --   --   ALKPHOS 90 87  --   --   BILITOT 0.4 0.3  --   --   PROT 7.2 7.1  --   --   ALBUMIN 2.8* 2.8*  2.8* 2.6* 2.8*   No results found for this basename: LIPASE, AMYLASE,  in the last 168 hours No results found for this basename: AMMONIA,  in the last 168 hours CBC:  Recent Labs Lab 06/16/13 1623 06/16/13 1915 06/19/13 0612  WBC 7.6 6.6 6.6  NEUTROABS 5.2 4.4  --   HGB 11.9* 11.8* 11.5*  HCT 35.2* 35.4* 36.0  MCV 82.1  81.4 85.5  PLT 136* 127* 136*   Cardiac Enzymes: No results found for this basename: CKTOTAL, CKMB, CKMBINDEX, TROPONINI,  in the last 168 hours BNP (last 3 results)  Recent Labs  09/25/12 1743 09/29/12 0530  PROBNP 16961.0* 5247.0*   CBG:  Recent Labs Lab 06/20/13 0737 06/20/13 1125 06/20/13 1657 06/20/13 2230 06/21/13 0800  GLUCAP 98 168* 149* 169* 102*    Recent Results (from the past 240 hour(s))  CULTURE, BLOOD (ROUTINE X 2)     Status: None   Collection Time    06/16/13  4:43 PM      Result Value Ref Range Status   Specimen Description BLOOD ARM LEFT   Final   Special Requests BOTTLES DRAWN AEROBIC AND ANAEROBIC 5CCBLUE 4CCRED   Final   Culture  Setup Time     Final   Value: 06/16/2013 22:10     Performed at [facility]   Culture     Final   Value:        BLOOD CULTURE RECEIVED NO GROWTH TO DATE CULTURE WILL BE HELD FOR 5 DAYS  BEFORE ISSUING A FINAL NEGATIVE REPORT     Performed at [facility]   Report Status PENDING   Incomplete  CULTURE, BLOOD (ROUTINE X 2)     Status: None   Collection Time    06/16/13  4:53 PM      Result Value Ref Range Status   Specimen Description BLOOD ARM RIGHT   Final   Special Requests BOTTLES DRAWN AEROBIC AND ANAEROBIC 10CC   Final   Culture  Setup Time     Final   Value: 06/16/2013 22:10     Performed at [facility]   Culture     Final   Value:        BLOOD CULTURE RECEIVED NO GROWTH TO DATE CULTURE WILL BE HELD FOR 5 DAYS BEFORE ISSUING A FINAL NEGATIVE REPORT     Performed at [facility]   Report Status PENDING   Incomplete  URINE CULTURE     Status: None   Collection Time    06/18/13  6:34 PM      Result Value Ref Range Status   Specimen Description URINE, RANDOM   Final   Special Requests NONE   Final   Culture  Setup Time     Final   Value: 06/18/2013 19:27     Performed at [facility] Count     Final   Value: >=100,000 COLONIES/ML     Performed at [facility]   Culture     Final   Value: ESCHERICHIA COLI     Performed at [facility]   Report Status 06/20/2013 FINAL   Final   Organism ID, Bacteria ESCHERICHIA COLI   Final     Studies: No results found.  Scheduled Meds: . atorvastatin  80 mg Oral q1800  . budesonide-formoterol  2 puff Inhalation BID  . calcitRIOL  0.25 mcg Oral Daily  . calcium acetate  1,334 mg Oral TID WC  . cefUROXime  500 mg Oral Q24H  . clopidogrel  75 mg Oral Q breakfast  . doxycycline  100 mg Oral Q12H  . DULoxetine  60 mg Oral Daily  . furosemide  160 mg Oral BID  . heparin  5,000 Units Subcutaneous 3 times per day  . insulin aspart  0-5 Units Subcutaneous QHS  . insulin aspart  0-9 Units Subcutaneous  TID WC  . insulin glargine  28 Units Subcutaneous QHS  . ketoconazole   Topical BID  . pantoprazole  40 mg Oral Daily  . pregabalin  50 mg Oral BID  . sodium  chloride  3 mL Intravenous Q12H   Continuous Infusions:    Time spent: 25 minutes  [name]  [facility] Pager [phone]. If 7PM-7AM, please contact night-coverage at [email or web address], password [facility] 06/21/2013, 9:47 AM  LOS: 5 days

## 2013-06-21 NOTE — Progress Notes (Signed)
Small blackish area/necrotic? On the right second pinky toe measuring 1 cm x 0.5cm.On call md made aware.

## 2013-06-22 DIAGNOSIS — I2510 Atherosclerotic heart disease of native coronary artery without angina pectoris: Secondary | ICD-10-CM

## 2013-06-22 LAB — CULTURE, BLOOD (ROUTINE X 2)
Culture: NO GROWTH
Culture: NO GROWTH

## 2013-06-22 LAB — GLUCOSE, CAPILLARY
Glucose-Capillary: 80 mg/dL (ref 70–99)
Glucose-Capillary: 159 mg/dL — ABNORMAL HIGH (ref 70–99)
Glucose-Capillary: 121 mg/dL — ABNORMAL HIGH (ref 70–99)
Glucose-Capillary: 210 mg/dL — ABNORMAL HIGH (ref 70–99)

## 2013-06-22 LAB — RENAL FUNCTION PANEL
Albumin: 2.8 g/dL — ABNORMAL LOW (ref 3.5–5.2)
BUN: 65 mg/dL — ABNORMAL HIGH (ref 6–23)
CO2: 23 meq/L (ref 19–32)
CREATININE: 4.01 mg/dL — AB (ref 0.50–1.10)
Calcium: 8.6 mg/dL (ref 8.4–10.5)
Chloride: 101 mEq/L (ref 96–112)
GFR calc non Af Amer: 11 mL/min — ABNORMAL LOW (ref >90)
GFR, EST AFRICAN AMERICAN: 12 mL/min — AB (ref >90)
Glucose, Bld: 168 mg/dL — ABNORMAL HIGH (ref 70–99)
Phosphorus: 6.3 mg/dL — ABNORMAL HIGH (ref 2.3–4.6)
Potassium: 3.9 mEq/L (ref 3.7–5.3)
SODIUM: 139 meq/L (ref 137–147)

## 2013-06-22 MED ORDER — FUROSEMIDE 10 MG/ML IJ SOLN
120.0000 mg | Freq: Two times a day (BID) | INTRAVENOUS | Status: DC
  Administered 2013-06-22: 120 mg via INTRAVENOUS
  Filled 2013-06-22 (×3): qty 12

## 2013-06-22 MED ORDER — CHLORHEXIDINE GLUCONATE CLOTH 2 % EX PADS: 6.0000 | MEDICATED_PAD | Freq: Every day | CUTANEOUS | Status: DC

## 2013-06-22 MED ORDER — METOLAZONE 5 MG PO TABS
5.0000 mg | ORAL_TABLET | Freq: Once | ORAL | Status: AC
  Administered 2013-06-22: 5 mg via ORAL
  Filled 2013-06-22: qty 1

## 2013-06-22 NOTE — Progress Notes (Signed)
Physical Therapy Treatment Patient Details Name: [name] MRN: [id number] DOB: 10-08-1945 Today's Date: 06/22/2013 Time: 7680-8811 PT Time Calculation (min): 8 min  PT Assessment / Plan / Recommendation  History of Present Illness 68 y.o. female admitted to [facility] on 06/16/13 with with significant prior vascular history including peripheral vascular disease status post femoropopliteal bypass with claudication, coronary artery disease status post multiple MIs status post CABG, CVA, type 2 diabetes, chronic renal insufficiency with a baseline creatinine of 2.7 presenting with cellulitis and acute kidney injury. Per the patient she noticed a small on the left anterior shin about 7-10 days ago. Of note, patient had a very extended hospitalization for essentially the entire month of October for nonhealing right groin wound.   PT Comments   Patient lethargic needing assist back to bed to prevent falling out of chair.  Reports due to sensitivity to meds and had something for anxiety last night.  Will try and return later today for ambulation in hallway as tolerated.  Follow Up Recommendations  Home health PT;Supervision - Intermittent     Does the patient have the potential to tolerate intense rehabilitation   N/A  Barriers to Discharge  Decreased caregiver assist      Equipment Recommendations  None recommended by PT    Recommendations for Other Services  None  Frequency Min 3X/week   Progress towards PT Goals Progress towards PT goals: Not progressing toward goals - comment (limited due to lethargy from meds)  Plan Current plan remains appropriate    Precautions / Restrictions Precautions Precautions: Fall Precaution Comments: leaning head down on table sleeping (pt reports due to anxiety meds given last evening, still groggy)   Pertinent Vitals/Pain Tender left LE with edema and redness evident    Mobility  Bed Mobility Overal bed mobility: Needs Assistance Bed Mobility: Sit to  Supine Sit to supine: Min assist General bed mobility comments: assist for feet into bed, pt scooted to head of bed with cues, supervision Transfers Overall transfer level: Needs assistance Equipment used: Rolling walker (2 wheeled) Transfers: Sit to/from [employer] Sit to Stand: Min guard Stand pivot transfers: Min guard General transfer comment: assist up from recliner and back to bed with walker, pt reports too groggy from meds to walk this am.        PT Goals (current goals can now be found in the care plan section)    Visit Information  Last PT Received On: 06/22/13 Assistance Needed: +1 History of Present Illness: 67 y.o. female admitted to [facility] on 06/16/13 with with significant prior vascular history including peripheral vascular disease status post femoropopliteal bypass with claudication, coronary artery disease status post multiple MIs status post CABG, CVA, type 2 diabetes, chronic renal insufficiency with a baseline creatinine of 2.7 presenting with cellulitis and acute kidney injury. Per the patient she noticed a small on the left anterior shin about 7-10 days ago. Of note, patient had a very extended hospitalization for essentially the entire month of October for nonhealing right groin wound.    Subjective Data      Cognition  Cognition Arousal/Alertness: Lethargic;Suspect due to medications Behavior During Therapy: [facility] for tasks assessed/performed Overall Cognitive Status: Within Functional Limits for tasks assessed    Balance  Balance Overall balance assessment: Needs assistance Sitting-balance support: Feet supported Sitting balance-Leahy Scale: Poor Sitting balance - Comments: likely normally good balance, but falling asleep sitting up and risk for anterior loss of balance Standing balance support: Bilateral upper extremity supported  Standing balance-Leahy Scale: Poor Standing balance comment: needs UE assist to pivot to bed  End of Session PT -  End of Session Activity Tolerance: Patient limited by fatigue Patient left: in bed;with bed alarm set;with call bell/phone within reach   GP     [facility] BWYNN,CYNDI 06/22/2013, 10:52 AM Sheran Lawlessyndi Mareena Cavan, PT [phone] 06/22/2013

## 2013-06-22 NOTE — Consult Note (Addendum)
Advanced Heart Failure Team Consult Note  Referring Physician: Dr. Arrie Aran Primary Physician: Dr. Hale Bogus Primary Cardiologist:  Dr. Melburn Popper  Reason for Consultation: Heart Failure   HPI:    Ms Feinman is a 68 yo female with a history of CAD s/p CABG (2007), ICM, chronic systolic HF EF ~25%, HTN, DM2, PVD s/p R fem-pop (12/2012) complicated by necrotic anterior abdominal wall fascia and lateral soft tissues and CKD stage III-IV. Last myoview was 10/2011 which was negative for ischemia. 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 Over the past 6 months reports a marked decrease in functional capacity. She has had 3 readmissions in the past 6 months. Complaints of increased weight gain ~ 30 lbs over past 3-4 months, SOB, CP and edema. Her kidney function has continued to decline as well. Has had several episodes of exertional chest pressure recently.   Baseline CR  5/14 Cr 1.4 9/14 Cr 1.4 10/14 Episode acute renal failure 4.5->2.1 2/15  Admit Cr 3.77-> 4.01  Review of Systems: [y] = yes, [ ]  = no   General: Weight gain [ Y]; Weight loss [ ] ; Anorexia [ ] ; Fatigue [ ] ; Fever [ ] ; Chills [ ] ; Weakness [ ]   Cardiac: Chest pain/pressure [ ] ; Resting SOB [ ] ; Exertional SOB [Y ]; Orthopnea [ Y]; Pedal Edema [Y ]; Palpitations [ ] ; Syncope [ ] ; Presyncope [ ] ; Paroxysmal nocturnal dyspnea[ ]   Pulmonary: Cough [ ] ; Wheezing[ ] ; Hemoptysis[ ] ; Sputum [ ] ; Snoring [ ]   GI: Vomiting[ ] ; Dysphagia[ ] ; Melena[ ] ; Hematochezia [ ] ; Heartburn[ ] ; Abdominal pain [ ] ; Constipation [ ] ; Diarrhea [ ] ; BRBPR [ ]   GU: Hematuria[ ] ; Dysuria [ ] ; Nocturia[ ]   Vascular: Pain in legs with walking [ ] ; Pain in feet with lying flat [ ] ; Non-healing sores [ ] ; Stroke [ ] ; TIA [ ] ; Slurred speech [ ] ;  Neuro: Headaches[ ] ; Vertigo[ ] ; Seizures[ ] ; Paresthesias[Y ];Blurred vision [ ] ; Diplopia [ ] ; Vision changes [  ]  Ortho/Skin: Arthritis [ ] ; Joint pain [ Y]; Muscle pain [ ] ; Joint swelling [ ] ; Back Pain [ ] ; Rash [ ]   Psych: Depression[ ] ; Anxiety[ ]   Heme: Bleeding problems [ ] ; Clotting disorders [ ] ; Anemia [ ]   Endocrine: Diabetes [Y ]; Thyroid dysfunction[ ]   Home Medications Prior to Admission medications   Medication Sig Start Date End Date Taking? Authorizing Provider  albuterol (PROVENTIL HFA;VENTOLIN HFA) 108 (90 BASE) MCG/ACT inhaler Inhale 2 puffs into the lungs every 6 (six) hours as needed for wheezing or shortness of breath.    Yes Historical Provider, MD  ALPRAZolam Prudy Feeler) 0.5 MG tablet Take 0.5 mg by mouth daily as needed for anxiety.    Yes Historical Provider, MD  budesonide-formoterol (SYMBICORT) 160-4.5 MCG/ACT inhaler Inhale 2 puffs into the lungs 2 (two) times daily.    Yes Historical Provider, MD  clopidogrel (PLAVIX) 75 MG tablet Take 75 mg by mouth daily with breakfast.   Yes Historical Provider, MD  diphenhydrAMINE (BENADRYL) 25 mg capsule Take 25 mg by mouth every 6 (six) hours as needed for allergies.    Yes Historical Provider, MD  DULoxetine (CYMBALTA) 60 MG capsule Take 60 mg by mouth daily.   Yes Historical Provider, MD  insulin glargine (LANTUS) 100 UNIT/ML injection Inject 28 Units into the skin at bedtime.    Yes Historical Provider, MD  insulin lispro (  HUMALOG) 100 UNIT/ML injection Inject 6-11 Units into the skin 3 (three) times daily before meals. Per sliding scale   Yes Historical Provider, MD  nitroGLYCERIN (NITROSTAT) 0.4 MG SL tablet Place 0.4 mg under the tongue every 5 (five) minutes as needed for chest pain.    Yes Historical Provider, MD  pantoprazole (PROTONIX) 40 MG tablet Take 40 mg by mouth daily.   Yes Historical Provider, MD  pregabalin (LYRICA) 50 MG capsule Take 50 mg by mouth 2 (two) times daily.   Yes Historical Provider, MD  rosuvastatin (CRESTOR) 40 MG tablet Take 40 mg by mouth daily.   Yes Historical Provider, MD  torsemide (DEMADEX) 20 MG  tablet Take 20 mg by mouth 2 (two) times daily. 2 tab daily by mouth 02/27/13  Yes Lars Mage, PA-C  ciprofloxacin (CIPRO) 750 MG tablet Take 750 mg by mouth daily with breakfast. Finished sometime in January per patient 03/19/13   Sherren Kerns, MD    Past Medical History: Past Medical History  Diagnosis Date  . Coronary artery disease     a. s/p CABG 2007 (L-RI, S-dRCA, S-LAD, S-OM);  b. myoview 6/13:  anteroapical MI, no ischemia, EF 31%  . Diabetes mellitus   . Hypertension   . Asthma   . Bronchitis   . Reflux   . Hyperlipidemia   . Chronic systolic heart failure     a. echo 6/13: mild LVH, EF 15-20%, diff HK, Gr 1 DD, mild reduced RVSF, PASP 32.  Marland Kitchen GERD (gastroesophageal reflux disease)   . Arthritis   . CKD (chronic kidney disease)   . Chronic back pain   . Hypoxia     a. Adm 09/2012 - discharged with home O2 in setting of CHF.  Marland Kitchen Abnormal stress test     a. Nuc 10/2011: bright target which may be in left breast or L chest wall or axilla. Pt reports she f/u with PCP and mammogram was OK (left breast cyst).  . Complication of anesthesia     slow to awaken  . 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 Anginal pain     "rarely, had some 2 months ago, not sure if heart or arm, cardiologist aware.  . CHF (congestive heart failure)   . Shortness of breath     at times- sensitive to perfume.  . Peripheral vascular disease   . Depression   . Stroke 05/2011    a. R ischemic CVA 05/2011. Right hand weakness.  . Pneumonia     2010ish  . Neuropathic pain     feet and hands  . Polio     as a child  . Altered level of consciousness     2 weeks now  . Cellulitis 06/2013    LEFT LOWER EXTREMITY    Past Surgical History: Past Surgical History  Procedure Laterality Date  . Coronary artery bypass graft      LIMA to RI, SVG to distal RCA, SVG to LAD, SVG to OM. 2007  . Joint replacement      Right hip replacement   . Debridement skin / sq / muscle of trunk      s/p  staph infection from CABG 2007  . Cystectomy      Subcutaneous  . Partial hip arthroplasty    . Stroke    . Femoral-popliteal bypass graft Right 01/20/2013    Procedure: BYPASS GRAFT RIGHT FEMORAL-BELOW KNEE POPLITEAL ARTERY WITH RINGED GORTEX GRAFT ;  Surgeon: Sherren Kerns, MD;  Location: [facility] OR;  Service: Vascular;  Laterality: Right;  . Patch angioplasty Right 01/20/2013    Procedure: VEIN  PATCH ANGIOPLASTY;  Surgeon: Sherren Kerns, MD;  Location: [facility] OR;  Service: Vascular;  Laterality: Right;  . I&d extremity Right 02/06/2013    Procedure: DEBRIDEMENT RIGHT GROIN WOUND & APPLICATION OF WOUND VAC.;  Surgeon: Sherren Kerns, MD;  Location: [facility] OR;  Service: Vascular;  Laterality: Right;  . Groin debridement Right 02/11/2013    Procedure: GROIN DEBRIDEMENT & WOUND VAC APPLICATION;  Surgeon: Sherren Kerns, MD;  Location: [facility] OR;  Service: Vascular;  Laterality: Right;  . I&d extremity Right 02/23/2013    Procedure: IRRIGATION AND DEBRIDEMENT RIGHT GROIN;  Surgeon: Sherren Kerns, MD;  Location: [facility] OR;  Service: Vascular;  Laterality: Right;  . Application of wound vac Right 02/23/2013    Procedure: APPLICATION OF WOUND VAC;  Surgeon: Sherren Kerns, MD;  Location: [facility] OR;  Service: Vascular;  Laterality: Right;  . Hematoma evacuation Right 02/12/2013    Procedure: EVACUATION HEMATOMA;  Surgeon: Fransisco Hertz, MD;  Location: [facility] OR;  Service: Vascular;  Laterality: Right;    Family History: Family History  Problem Relation Age of Onset  . Coronary artery disease Father 31    Unclear if it was an MI  . Stroke Mother 67  . Coronary artery disease Brother 39  . Coronary artery disease Brother 29    Social History: History   Social History  . Marital Status: Divorced    Spouse Name: N/A    Number of Children: 4  . Years of Education: N/A   Occupational History  . Retired  [employer]    [employer]   Social History Main Topics  . Smoking status: Former Smoker -- 0.00  packs/day for 50 years    Types: Cigarettes    Quit date: 01/06/2013  . Smokeless tobacco: Never Used  . Alcohol Use: No  . Drug Use: No  . Sexual Activity: None   Other Topics Concern  . None   Social History Narrative   Lives with grandson 17 years old.    Allergies:  Allergies  Allergen Reactions  . Sulfa Antibiotics Anaphylaxis and Shortness Of Breath  . Codeine Nausea Only and Other (See Comments)    Severe headache  . Ivp Dye [Iodinated Diagnostic Agents] Hives and Other (See Comments)    Severe anxiety  . Penicillins Swelling and Other (See Comments)    "huge sores". Pt has tolerated both Zosyn and Keflex in the past.    Objective:    Vital Signs:   Temp:  [97.7 F (36.5 C)-98 F (36.7 C)] 97.7 F (36.5 C) (02/16 1139) Pulse Rate:  [53-108] 95 (02/16 1139) Resp:  [19-23] 20 (02/16 1139) BP: (125-158)/(76-83) 141/76 mmHg (02/16 1139) SpO2:  [88 %-94 %] 90 % (02/16 1139) Weight:  [179 lb (81.194 kg)] 179 lb (81.194 kg) (02/15 2208) Last BM Date: 06/20/13  Weight change: Filed Weights   06/19/13 2206 06/20/13 2049 06/21/13 2208  Weight: 179 lb 8 oz (81.421 kg) 180 lb (81.647 kg) 179 lb (81.194 kg)    Intake/Output:   Intake/Output Summary (Last 24 hours) at 06/22/13 1518 Last data filed at 06/22/13 1302  Gross per 24 hour  Intake    600 ml  Output   2600 ml  Net  -2000 ml     Physical Exam: General:  Elderly. Lying in bed No resp difficulty  HEENT: normal Neck: supple. JVP 14-15. Carotids 2+ bilat; no bruits. No lymphadenopathy or thryomegaly appreciated. Cor: PMI nondisplaced. Regular rate & rhythm. No rubs, gallops or murmurs. Lungs: clear Abdomen: soft, nontender, + distended. No hepatosplenomegaly. No bruits or masses. Good bowel sounds. Extremities: no cyanosis, clubbing, rash, 3 + bilateral edema Neuro: alert & orientedx3, cranial nerves grossly intact. moves all 4 extremities w/o difficulty. Affect pleasant   Labs: Basic Metabolic  Panel:  Recent Labs Lab 06/18/13 0432 06/19/13 0612 06/20/13 0400 06/21/13 0610 06/22/13 1350  NA 139 141  141 139 141 139  K 4.1 4.4  4.4 5.0 4.1 3.9  CL 106 108  108 106 105 101  CO2 17* 17*  17* 18* 20 23  GLUCOSE 179* 125*  124* 112* 96 168*  BUN 52* 50*  51* 52* 57* 65*  CREATININE 3.87* 4.01*  4.00* 4.00* 4.03* 4.01*  CALCIUM 8.2* 8.2*  8.3* 8.3* 8.6 8.6  PHOS  --  6.8*  6.9* 7.1* 6.7* 6.3*    Liver Function Tests:  Recent Labs Lab 06/17/13 0335 06/19/13 0612 06/20/13 0400 06/21/13 0610 06/22/13 1350  AST 15 14  --   --   --   ALT 17 15  --   --   --   ALKPHOS 90 87  --   --   --   BILITOT 0.4 0.3  --   --   --   PROT 7.2 7.1  --   --   --   ALBUMIN 2.8* 2.8*  2.8* 2.6* 2.8* 2.8*   No results found for this basename: LIPASE, AMYLASE,  in the last 168 hours No results found for this basename: AMMONIA,  in the last 168 hours  CBC:  Recent Labs Lab 06/16/13 1623 06/16/13 1915 06/19/13 0612  WBC 7.6 6.6 6.6  NEUTROABS 5.2 4.4  --   HGB 11.9* 11.8* 11.5*  HCT 35.2* 35.4* 36.0  MCV 82.1 81.4 85.5  PLT 136* 127* 136*    Cardiac Enzymes: No results found for this basename: CKTOTAL, CKMB, CKMBINDEX, TROPONINI,  in the last 168 hours  BNP: BNP (last 3 results)  Recent Labs  09/25/12 1743 09/29/12 0530  PROBNP 16961.0* 5247.0*    CBG:  Recent Labs Lab 06/21/13 1120 06/21/13 1714 06/21/13 2206 06/22/13 0814 06/22/13 1134  GLUCAP 172* 131* 139* 80 159*    Coagulation Studies: No results found for this basename: LABPROT, INR,  in the last 72 hours   Imaging:  No results found.   Medications:     Current Medications: . atorvastatin  80 mg Oral q1800  . budesonide-formoterol  2 puff Inhalation BID  . calcitRIOL  0.25 mcg Oral Daily  . calcium acetate  1,334 mg Oral TID WC  . cefUROXime  500 mg Oral Q24H  . clopidogrel  75 mg Oral Q breakfast  . doxycycline  100 mg Oral Q12H  . DULoxetine  60 mg Oral Daily  .  furosemide  160 mg Oral BID  . heparin  5,000 Units Subcutaneous 3 times per day  . insulin aspart  0-5 Units Subcutaneous QHS  . insulin aspart  0-9 Units Subcutaneous TID WC  . insulin glargine  28 Units Subcutaneous QHS  . ketoconazole   Topical BID  . pantoprazole  40 mg Oral Daily  . pregabalin  50 mg Oral BID  . sodium chloride  3 mL Intravenous Q12H     Infusions:      Assessment:   1) A/C  systolic HF - EF 16-10% (2013) 2) Acute on chronic renal failure, stage IV 3) DM2 4) HTN 5) PVD - R fem-pop (12/2012) 6) CAD - s/p CABG 2007  Plan/Discussion:    Ms. Malbrough is unfortunately in a very difficult situation and is suffering from end stage HF along with end-stage renal disease. Last ECHO was in 2013 and EF was 15-20%. She is markedly volume overloaded and her Cr continues to rise. Looking back through her chart 6 months ago her Cr was around 2.0. Would like to try and preserve her kidneys and keep off dialysis as long as possible, however am concerned that we are nearing this point. Will do RHC tomorrow to assess hemodynamics and CO. If CO low will start milrinone to try and supplement CO and perfuse her kidneys.  Dr. Gala Romney had lengthy discussion with patient and she would like to try and hold off as long as possible, however if needs HD would be willing to try.   Will change to IV lasix 80 mg IV BID along with metolazone and try to aggressively diurese. Strict I/Os and restrict fluids. BMET daily.  Length of Stay: 6  Aundria Rud NP-C 06/22/2013, 3:18 PM  Advanced Heart Failure Team Pager [phone] (M-F; 7a - 4p)  Please contact [facility] for night-coverage after hours (4p -7a ) and weekends on [email or web address]  Patient seen and examined with Ulla Potash, NP. We discussed all aspects of the encounter. I agree with the assessment and plan as stated above.   Difficult situation. She has a/c systolic HF with marked volume overload and worsening renal  function. Given her Cr trend I suspect cardiorenal syndrome may ne playing a major role in her renal failure. While I think she will inevitably need HD, I do feel it is reasonable to proceed with right heart cath to see if a course of inotropic support may improve her renal function and delay the need for HD and facilitate diuresis. She would like to give this a try as she is reluctant to start HD. Will schedule for tomorrow. Will switch back to IV lasix.   Once committed to HD may need coronary angio if chest pressure persists after volume removed.  D/W Dr. Arrie Aran.   Darrall Strey,MD 4:24 PM

## 2013-06-22 NOTE — Progress Notes (Signed)
Patient ID: [name], female   DOB: 1946/02/21, 68 y.o.   MRN: [id number] S:Pt feels better today, reluctant to start dialysis and was asking if anything could be done for her heart to help O:BP 141/76  Pulse 95  Temp(Src) 97.7 F (36.5 C) (Oral)  Resp 20  Ht 5' (1.524 m)  Wt 81.194 kg (179 lb)  BMI 34.96 kg/m2  SpO2 90%  Intake/Output Summary (Last 24 hours) at 06/22/13 1233 Last data filed at 06/22/13 0445  Gross per 24 hour  Intake    840 ml  Output   2100 ml  Net  -1260 ml   Intake/Output: I/O last 3 completed shifts: In: 1200 [P.O.:1200] Out: 2100 [Urine:2100]  Intake/Output this shift:    Weight change: -0.454 kg (-1 lb) Gen:WD WF in NAd CVS:no rub, II/VI SEM at RLSB Resp:decreased BS at bases PFX:TKWIOX Ext:2+ pitting edema on left 1+ on right   Recent Labs Lab 06/16/13 1623 06/16/13 1915 06/17/13 0335 06/18/13 0432 06/19/13 0612 06/20/13 0400 06/21/13 0610  NA 143  --  142 139 141  141 139 141  K 3.6*  --  3.4* 4.1 4.4  4.4 5.0 4.1  CL 107  --  105 106 108  108 106 105  CO2 19  --  17* 17* 17*  17* 18* 20  GLUCOSE 108*  --  99 179* 125*  124* 112* 96  BUN 51*  --  50* 52* 50*  51* 52* 57*  CREATININE 3.77* 3.73* 3.77* 3.87* 4.01*  4.00* 4.00* 4.03*  ALBUMIN  --   --  2.8*  --  2.8*  2.8* 2.6* 2.8*  CALCIUM 8.3*  --  8.2* 8.2* 8.2*  8.3* 8.3* 8.6  PHOS  --   --   --   --  6.8*  6.9* 7.1* 6.7*  AST  --   --  15  --  14  --   --   ALT  --   --  17  --  15  --   --    Liver Function Tests:  Recent Labs Lab 06/17/13 0335 06/19/13 0612 06/20/13 0400 06/21/13 0610  AST 15 14  --   --   ALT 17 15  --   --   ALKPHOS 90 87  --   --   BILITOT 0.4 0.3  --   --   PROT 7.2 7.1  --   --   ALBUMIN 2.8* 2.8*  2.8* 2.6* 2.8*   No results found for this basename: LIPASE, AMYLASE,  in the last 168 hours No results found for this basename: AMMONIA,  in the last 168 hours CBC:  Recent Labs Lab 06/16/13 1623 06/16/13 1915 06/19/13 0612   WBC 7.6 6.6 6.6  NEUTROABS 5.2 4.4  --   HGB 11.9* 11.8* 11.5*  HCT 35.2* 35.4* 36.0  MCV 82.1 81.4 85.5  PLT 136* 127* 136*   Cardiac Enzymes: No results found for this basename: CKTOTAL, CKMB, CKMBINDEX, TROPONINI,  in the last 168 hours CBG:  Recent Labs Lab 06/21/13 1120 06/21/13 1714 06/21/13 2206 06/22/13 0814 06/22/13 1134  GLUCAP 172* 131* 139* 80 159*    Iron Studies: No results found for this basename: IRON, TIBC, TRANSFERRIN, FERRITIN,  in the last 72 hours Studies/Results: No results found. Marland Kitchen atorvastatin  80 mg Oral q1800  . budesonide-formoterol  2 puff Inhalation BID  . calcitRIOL  0.25 mcg Oral Daily  . calcium acetate  1,334 mg Oral TID  WC  . cefUROXime  500 mg Oral Q24H  . clopidogrel  75 mg Oral Q breakfast  . doxycycline  100 mg Oral Q12H  . DULoxetine  60 mg Oral Daily  . furosemide  160 mg Oral BID  . heparin  5,000 Units Subcutaneous 3 times per day  . insulin aspart  0-5 Units Subcutaneous QHS  . insulin aspart  0-9 Units Subcutaneous TID WC  . insulin glargine  28 Units Subcutaneous QHS  . ketoconazole   Topical BID  . pantoprazole  40 mg Oral Daily  . pregabalin  50 mg Oral BID  . sodium chloride  3 mL Intravenous Q12H    BMET    Component Value Date/Time   NA 141 06/21/2013 0610   K 4.1 06/21/2013 0610   CL 105 06/21/2013 0610   CO2 20 06/21/2013 0610   GLUCOSE 96 06/21/2013 0610   BUN 57* 06/21/2013 0610   CREATININE 4.03* 06/21/2013 0610   CALCIUM 8.6 06/21/2013 0610   GFRNONAA 11* 06/21/2013 0610   GFRAA 12* 06/21/2013 0610   CBC    Component Value Date/Time   WBC 6.6 06/19/2013 0612   WBC 9.2 12/19/2012 1601   RBC 4.21 06/19/2013 0612   RBC 4.66 12/19/2012 1601   HGB 11.5* 06/19/2013 0612   HGB 13.7 12/19/2012 1601   HCT 36.0 06/19/2013 0612   HCT 42.9 12/19/2012 1601   PLT 136* 06/19/2013 0612   MCV 85.5 06/19/2013 0612   MCV 92.0 12/19/2012 1601   MCH 27.3 06/19/2013 0612   MCH 29.4 12/19/2012 1601   MCHC 31.9 06/19/2013 0612   MCHC  31.9 12/19/2012 1601   RDW 16.7* 06/19/2013 0612   LYMPHSABS 1.6 06/16/2013 1915   MONOABS 0.4 06/16/2013 1915   EOSABS 0.2 06/16/2013 1915   BASOSABS 0.0 06/16/2013 1915   Assessment/Plan:  1. 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  She discussed HD with Dr. Kathrene BongoGoldsborough yesterday, however she wants to talk with her cardiologist to see if anything can be done for her heart to improve CO and renal function short of HD.  I consulted on her during her last hospitalization with infected vein harvest site and Scr peaked at 4.73 at that time then improved to the mid 2's.  No urgent indication for HD and will ask the heart failure team to offer suggestions on inotropes and /or re-imaging with ECHO 2. CHF- acute on chronic systolic- diuresing with po lasix.  As above, consulted Heart Failure team 3. ACDz- follow 4. SHPTH: on vit D and binders 5. UTI- on ceftin and doxy (cellulitis resolved) 6. HTN 7. DM- per primary svc 8. Vascular access- will need vein mapping and longterm access and possible PC during this hospitalization depending upon her renal function  Mase Dhondt A

## 2013-06-22 NOTE — Progress Notes (Signed)
PROGRESS NOTE  [name] RPR:[id number] DOB: [birth year] DOA: 06/16/2013 PCP: [name], MD  Assessment/Plan:   Cellulitis: Improving. Change to PO abx.  UTI- E coli resistant to FQ- outpatient study. Sensitive to rocephin. Asked that cx be faxed.  Changed cipro to ceftin. Cont doxy for cellulitis    Acute renal failure with CKD -Likely due to acute infection and prerenal state, but creatinine not improving with IVF and holding demadex. - Creatinine last year was over 2. Sees Dr. Marisue Humble. Continue current, consult nephrology for recs- plan for possible dialysis  -? Uremia    HTN (hypertension)    Chronic systolic congestive heart failure, compensated    PVD (peripheral vascular disease) with claudication: ABI shows right WNL and left with moderate disease- can follow up outpatient with vascular    Chronic kidney disease (CKD), stage 3-4  Tinea pedis: Antifungals topical  Venous stasis dermatitis  Hyperlipidemia  DM- HgbA1C- 7.9 -SSI TID and QHS   HPI/Subjective: Agreeable that dialysis may help her   Objective: Filed Vitals:   06/22/13 0821  BP: 158/82  Pulse: 108  Temp: 97.8 F (36.6 C)  Resp: 19    Intake/Output Summary (Last 24 hours) at 06/22/13 0937 Last data filed at 06/22/13 0445  Gross per 24 hour  Intake    840 ml  Output   2100 ml  Net  -1260 ml   Filed Weights   06/19/13 2206 06/20/13 2049 06/21/13 2208  Weight: 81.421 kg (179 lb 8 oz) 81.647 kg (180 lb) 81.194 kg (179 lb)    Exam:   General:  Nontoxic.  Cardiovascular: Regular rate rhythm without murmurs gallops rubs  Respiratory: Clear to auscultation bilaterally without wheezes rhonchi or rales  Abdomen: Soft nontender nondistended  Ext: Ulceration noted bilateral pretibial areas.  Both legs hyperpigmented. No pitting edema. Erythema resolved  Basic Metabolic Panel:  Recent Labs Lab 06/17/13 0335 06/18/13 0432 06/19/13 0612 06/20/13 0400 06/21/13 0610  NA  142 139 141  141 139 141  K 3.4* 4.1 4.4  4.4 5.0 4.1  CL 105 106 108  108 106 105  CO2 17* 17* 17*  17* 18* 20  GLUCOSE 99 179* 125*  124* 112* 96  BUN 50* 52* 50*  51* 52* 57*  CREATININE 3.77* 3.87* 4.01*  4.00* 4.00* 4.03*  CALCIUM 8.2* 8.2* 8.2*  8.3* 8.3* 8.6  PHOS  --   --  6.8*  6.9* 7.1* 6.7*   Liver Function Tests:  Recent Labs Lab 06/17/13 0335 06/19/13 0612 06/20/13 0400 06/21/13 0610  AST 15 14  --   --   ALT 17 15  --   --   ALKPHOS 90 87  --   --   BILITOT 0.4 0.3  --   --   PROT 7.2 7.1  --   --   ALBUMIN 2.8* 2.8*  2.8* 2.6* 2.8*   No results found for this basename: LIPASE, AMYLASE,  in the last 168 hours No results found for this basename: AMMONIA,  in the last 168 hours CBC:  Recent Labs Lab 06/16/13 1623 06/16/13 1915 06/19/13 0612  WBC 7.6 6.6 6.6  NEUTROABS 5.2 4.4  --   HGB 11.9* 11.8* 11.5*  HCT 35.2* 35.4* 36.0  MCV 82.1 81.4 85.5  PLT 136* 127* 136*   Cardiac Enzymes: No results found for this basename: CKTOTAL, CKMB, CKMBINDEX, TROPONINI,  in the last 168 hours BNP (last 3 results)  Recent Labs  09/25/12 1743  09/29/12 0530  PROBNP 16961.0* 5247.0*   CBG:  Recent Labs Lab 06/21/13 0800 06/21/13 1120 06/21/13 1714 06/21/13 2206 06/22/13 0814  GLUCAP 102* 172* 131* 139* 80    Recent Results (from the past 240 hour(s))  CULTURE, BLOOD (ROUTINE X 2)     Status: None   Collection Time    06/16/13  4:43 PM      Result Value Ref Range Status   Specimen Description BLOOD ARM LEFT   Final   Special Requests BOTTLES DRAWN AEROBIC AND ANAEROBIC 5CCBLUE 4CCRED   Final   Culture  Setup Time     Final   Value: 06/16/2013 22:10     Performed at [facility]   Culture     Final   Value: NO GROWTH 5 DAYS     Performed at [facility]   Report Status 06/22/2013 FINAL   Final  CULTURE, BLOOD (ROUTINE X 2)     Status: None   Collection Time    06/16/13  4:53 PM      Result Value Ref Range Status    Specimen Description BLOOD ARM RIGHT   Final   Special Requests BOTTLES DRAWN AEROBIC AND ANAEROBIC 10CC   Final   Culture  Setup Time     Final   Value: 06/16/2013 22:10     Performed at [facility]   Culture     Final   Value: NO GROWTH 5 DAYS     Performed at [facility]   Report Status 06/22/2013 FINAL   Final  URINE CULTURE     Status: None   Collection Time    06/18/13  6:34 PM      Result Value Ref Range Status   Specimen Description URINE, RANDOM   Final   Special Requests NONE   Final   Culture  Setup Time     Final   Value: 06/18/2013 19:27     Performed at [facility]   Colony Count     Final   Value: >=100,000 COLONIES/ML     Performed at [facility]   Culture     Final   Value: ESCHERICHIA COLI     Performed at [facility]   Report Status 06/20/2013 FINAL   Final   Organism ID, Bacteria ESCHERICHIA COLI   Final     Studies: No results found.  Scheduled Meds: . atorvastatin  80 mg Oral q1800  . budesonide-formoterol  2 puff Inhalation BID  . calcitRIOL  0.25 mcg Oral Daily  . calcium acetate  1,334 mg Oral TID WC  . cefUROXime  500 mg Oral Q24H  . clopidogrel  75 mg Oral Q breakfast  . doxycycline  100 mg Oral Q12H  . DULoxetine  60 mg Oral Daily  . furosemide  160 mg Oral BID  . heparin  5,000 Units Subcutaneous 3 times per day  . insulin aspart  0-5 Units Subcutaneous QHS  . insulin aspart  0-9 Units Subcutaneous TID WC  . insulin glargine  28 Units Subcutaneous QHS  . ketoconazole   Topical BID  . pantoprazole  40 mg Oral Daily  . pregabalin  50 mg Oral BID  . sodium chloride  3 mL Intravenous Q12H   Continuous Infusions:    Time spent: 25 minutes  Evangaline Jou  [facility] Pager [phone]. If 7PM-7AM, please contact night-coverage at [email or web address], password [email or web address] 06/22/2013, 9:37 AM  LOS: 6 days

## 2013-06-22 NOTE — Progress Notes (Signed)
PT Cancellation Note  Patient Details Name: [name] MRN: [id number] DOB: 16-Oct-1945   Cancelled Treatment:    Reason Eval/Treat Not Completed: Other (comment); patient undergoing medical/cardiology consult.  Will see tomorrow due to limited tx today.   WYNN,CYNDI 06/22/2013, 3:39 PM

## 2013-06-23 ENCOUNTER — Encounter (HOSPITAL_COMMUNITY)
Admission: EM | Disposition: A | Discharge status: Hospice | Payer: Self-pay | Source: Home / Self Care | Attending: Internal Medicine

## 2013-06-23 DIAGNOSIS — N179 Acute kidney failure, unspecified: Secondary | ICD-10-CM

## 2013-06-23 DIAGNOSIS — N189 Chronic kidney disease, unspecified: Secondary | ICD-10-CM

## 2013-06-23 DIAGNOSIS — I10 Essential (primary) hypertension: Secondary | ICD-10-CM

## 2013-06-23 DIAGNOSIS — F17200 Nicotine dependence, unspecified, uncomplicated: Secondary | ICD-10-CM

## 2013-06-23 LAB — CBC
HCT: 35.4 % — ABNORMAL LOW (ref 36.0–46.0)
HEMOGLOBIN: 11.5 g/dL — AB (ref 12.0–15.0)
MCH: 27.7 pg (ref 26.0–34.0)
MCHC: 32.5 g/dL (ref 30.0–36.0)
MCV: 85.3 fL (ref 78.0–100.0)
Platelets: 113 10*3/uL — ABNORMAL LOW (ref 150–400)
RBC: 4.15 MIL/uL (ref 3.87–5.11)
RDW: 16.3 % — ABNORMAL HIGH (ref 11.5–15.5)
WBC: 6.1 10*3/uL (ref 4.0–10.5)

## 2013-06-23 LAB — RENAL FUNCTION PANEL
Albumin: 3 g/dL — ABNORMAL LOW (ref 3.5–5.2)
BUN: 69 mg/dL — ABNORMAL HIGH (ref 6–23)
CHLORIDE: 99 meq/L (ref 96–112)
CO2: 24 mEq/L (ref 19–32)
CREATININE: 3.9 mg/dL — AB (ref 0.50–1.10)
Calcium: 9 mg/dL (ref 8.4–10.5)
GFR calc Af Amer: 13 mL/min — ABNORMAL LOW (ref >90)
GFR, EST NON AFRICAN AMERICAN: 11 mL/min — AB (ref >90)
GLUCOSE: 144 mg/dL — AB (ref 70–99)
Phosphorus: 6.4 mg/dL — ABNORMAL HIGH (ref 2.3–4.6)
Potassium: 3.7 mEq/L (ref 3.7–5.3)
Sodium: 139 mEq/L (ref 137–147)

## 2013-06-23 LAB — BASIC METABOLIC PANEL
BUN: 69 mg/dL — AB (ref 6–23)
CHLORIDE: 100 meq/L (ref 96–112)
CO2: 23 meq/L (ref 19–32)
CREATININE: 3.93 mg/dL — AB (ref 0.50–1.10)
Calcium: 9 mg/dL (ref 8.4–10.5)
GFR calc Af Amer: 13 mL/min — ABNORMAL LOW (ref >90)
GFR calc non Af Amer: 11 mL/min — ABNORMAL LOW (ref >90)
GLUCOSE: 125 mg/dL — AB (ref 70–99)
Potassium: 3.9 mEq/L (ref 3.7–5.3)
Sodium: 140 mEq/L (ref 137–147)

## 2013-06-23 LAB — GLUCOSE, CAPILLARY
GLUCOSE-CAPILLARY: 192 mg/dL — AB (ref 70–99)
Glucose-Capillary: 106 mg/dL — ABNORMAL HIGH (ref 70–99)
Glucose-Capillary: 137 mg/dL — ABNORMAL HIGH (ref 70–99)
Glucose-Capillary: 109 mg/dL — ABNORMAL HIGH (ref 70–99)

## 2013-06-23 LAB — PROTIME-INR
INR: 1.17 (ref 0.00–1.49)
Prothrombin Time: 14.7 seconds (ref 11.6–15.2)

## 2013-06-23 SURGERY — RIGHT HEART CATH
Anesthesia: LOCAL

## 2013-06-23 MED ORDER — SODIUM CHLORIDE 0.9 % IJ SOLN: 3.0000 mL | INTRAMUSCULAR | Status: DC | PRN

## 2013-06-23 MED ORDER — SODIUM CHLORIDE 0.9 % IV SOLN: 250.0000 mL | INTRAVENOUS | Status: DC | PRN

## 2013-06-23 MED ORDER — SODIUM CHLORIDE 0.9 % IJ SOLN: 3.0000 mL | Freq: Two times a day (BID) | INTRAMUSCULAR | Status: DC

## 2013-06-23 MED ORDER — DEXTROSE 5 % IV SOLN
120.0000 mg | Freq: Two times a day (BID) | INTRAVENOUS | Status: DC
  Filled 2013-06-23 (×2): qty 12

## 2013-06-23 MED ORDER — METOLAZONE 5 MG PO TABS
5.0000 mg | ORAL_TABLET | Freq: Every day | ORAL | Status: DC
  Administered 2013-06-23 – 2013-06-24 (×2): 5 mg via ORAL
  Filled 2013-06-23 (×2): qty 1

## 2013-06-23 MED ORDER — FUROSEMIDE 10 MG/ML IJ SOLN
120.0000 mg | Freq: Three times a day (TID) | INTRAVENOUS | Status: DC
  Administered 2013-06-23 – 2013-06-24 (×4): 120 mg via INTRAVENOUS
  Filled 2013-06-23 (×6): qty 12

## 2013-06-23 NOTE — Progress Notes (Signed)
Physical Therapy Treatment Patient Details Name: [name] MRN: [id number] DOB: 1945-07-24 Today's Date: 06/23/2013 Time: [phone] PT Time Calculation (min): 23 min  PT Assessment / Plan / Recommendation  History of Present Illness 68 y.o. female admitted to [facility] on 06/16/13 with with significant prior vascular history including peripheral vascular disease status post femoropopliteal bypass with claudication, coronary artery disease status post multiple MIs status post CABG, CVA, type 2 diabetes, chronic renal insufficiency with a baseline creatinine of 2.7 presenting with cellulitis and acute kidney injury. Per the patient she noticed a small on the left anterior shin about 7-10 days ago. Of note, patient had a very extended hospitalization for essentially the entire month of October for nonhealing right groin wound.   PT Comments   Still weak, but progressing with tolerance to ambulation and independence with transfers.  Daughter here today and supportive.  Agree with d/c home with intermitted support and HHPT.  Follow Up Recommendations  Home health PT;Supervision - Intermittent     Does the patient have the potential to tolerate intense rehabilitation     Barriers to Discharge        Equipment Recommendations  None recommended by PT    Recommendations for Other Services    Frequency Min 3X/week   Progress towards PT Goals Progress towards PT goals: Progressing toward goals  Plan Current plan remains appropriate    Precautions / Restrictions Precautions Precautions: Fall   Pertinent Vitals/Pain Min soreness on left leg with some remaining errythema    Mobility  Bed Mobility Overal bed mobility: Needs Assistance Bed Mobility: Supine to Sit Supine to sit: Min assist;HOB elevated General bed mobility comments: assist to lift trunk upright, heavy rail use and increased time to move legs off bed Transfers Overall transfer level: Needs assistance Equipment used: Rolling  walker (2 wheeled) Transfers: Sit to/from Stand Sit to Stand: Min guard (from bed, toilet and armchair) General transfer comment: pushes up with UE assist to stand, slow and effortful, but not much help needed except from toilet Ambulation/Gait Ambulation/Gait assistance: Min guard Ambulation Distance (Feet): 125 Feet (and 20' x 2 in room) Assistive device: Rolling walker (2 wheeled) Gait Pattern/deviations: Step-through pattern;Shuffle;Decreased stride length;Trunk flexed General Gait Details: cues to keep hips inside walker, did find youth size walker for her to use.  She reports long history of bent back, encouraged use of UE 's to keep back upright as long as able      PT Goals (current goals can now be found in the care plan section)    Visit Information  Last PT Received On: 06/23/13 Assistance Needed: +1 History of Present Illness: 68 y.o. female admitted to [facility] on 06/16/13 with with significant prior vascular history including peripheral vascular disease status post femoropopliteal bypass with claudication, coronary artery disease status post multiple MIs status post CABG, CVA, type 2 diabetes, chronic renal insufficiency with a baseline creatinine of 2.7 presenting with cellulitis and acute kidney injury. Per the patient she noticed a small on the left anterior shin about 7-10 days ago. Of note, patient had a very extended hospitalization for essentially the entire month of October for nonhealing right groin wound.    Subjective Data   I've been up to the bathroom a lot overnight   Cognition  Cognition Arousal/Alertness: Awake/alert Behavior During Therapy: WFL for tasks assessed/performed Overall Cognitive Status: Within Functional Limits for tasks assessed    Balance  Balance Overall balance assessment: Needs assistance Sitting-balance support: Feet supported Sitting balance-Leahy  Scale: Fair Sitting balance - Comments: weak trunk musculature, but able to sit with feet on  floor in armchair wtihout loss of balance Standing balance-Leahy Scale: Poor Standing balance comment: needs UE assist for balance  End of Session PT - End of Session Equipment Utilized During Treatment: Gait belt Activity Tolerance: Patient tolerated treatment well Patient left: in chair;with call bell/phone within reach;with family/visitor present   GP     [facility] Hospital[facility]YNN,CYNDI 06/23/2013, 11:28 AM Sheran Lawlessyndi Wynn, PT [phone] 06/23/2013

## 2013-06-23 NOTE — Progress Notes (Signed)
Utilization review completed.  

## 2013-06-23 NOTE — Progress Notes (Signed)
Advanced Heart Failure Team Consult Note  Referring Physician: Dr. Arrie Aran Primary Physician: Dr. Hale Bogus Primary Cardiologist:  Dr. Melburn Popper  Reason for Consultation: Heart Failure   HPI:    [name] is a 68 yo female with a history of CAD s/p CABG (2007), ICM, chronic systolic HF EF ~25%, HTN, DM2, PVD s/p R fem-pop (12/2012) complicated by necrotic anterior abdominal wall fascia and lateral soft tissues and CKD stage III-IV. Last myoview was 10/2011 which was negative for ischemia. 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 Over the past 6 months reports a marked decrease in functional capacity. She has had 3 readmissions in the past 6 months. Complaints of increased weight gain ~ 30 lbs over past 3-4 months, SOB, CP and edema. Her kidney function has continued to decline as well. Has had several episodes of exertional chest pressure recently.   Baseline CR  5/14 Cr 1.4 9/14 Cr 1.4 10/14 Episode acute renal failure 4.5->2.1 2/15  Admit Cr 3.77-> 4.01-> 3.9  Started on IV lasix yesterday with metolazone. Has diuresed well over night -2.5L. Cr slightly better. Denies dyspnea.   Objective:    Vital Signs:   Temp:  [97.7 F (36.5 C)-97.9 F (36.6 C)] 97.9 F (36.6 C) (02/17 0625) Pulse Rate:  [94-95] 95 (02/17 0625) Resp:  [18-20] 18 (02/17 0625) BP: (137-154)/(75-80) 154/80 mmHg (02/17 0625) SpO2:  [90 %-97 %] 93 % (02/17 0625) Weight:  [81.194 kg (179 lb)] 81.194 kg (179 lb) (02/16 2059) Last BM Date: 06/20/13  Weight change: Filed Weights   06/20/13 2049 06/21/13 2208 06/22/13 2059  Weight: 81.647 kg (180 lb) 81.194 kg (179 lb) 81.194 kg (179 lb)    Intake/Output:   Intake/Output Summary (Last 24 hours) at 06/23/13 0902 Last data filed at 06/23/13 2863  Gross per 24 hour  Intake    120 ml  Output   2700 ml  Net  -2580 ml     Physical Exam: General:   Elderly. Lying in bed No resp difficulty HEENT: normal Neck: supple. JVP 10. Carotids 2+ bilat; no bruits. No lymphadenopathy or thryomegaly appreciated. Cor: PMI nondisplaced. Regular rate & rhythm. No rubs, gallops or murmurs. Lungs: clear Abdomen: soft, nontender, + distended. No hepatosplenomegaly. No bruits or masses. Good bowel sounds. Extremities: no cyanosis, clubbing, rash, 2-3 + bilateral edema Neuro: alert & orientedx3, cranial nerves grossly intact. moves all 4 extremities w/o difficulty. Affect pleasant   Labs: Basic Metabolic Panel:  Recent Labs Lab 06/19/13 0612 06/20/13 0400 06/21/13 0610 06/22/13 1350 06/23/13 0340 06/23/13 0600  NA 141  141 139 141 139 139 140  K 4.4  4.4 5.0 4.1 3.9 3.7 3.9  CL 108  108 106 105 101 99 100  CO2 17*  17* 18* 20 23 24 23   GLUCOSE 125*  124* 112* 96 168* 144* 125*  BUN 50*  51* 52* 57* 65* 69* 69*  CREATININE 4.01*  4.00* 4.00* 4.03* 4.01* 3.90* 3.93*  CALCIUM 8.2*  8.3* 8.3* 8.6 8.6 9.0 9.0  PHOS 6.8*  6.9* 7.1* 6.7* 6.3* 6.4*  --     Liver Function Tests:  Recent Labs Lab 06/17/13 0335 06/19/13 0612 06/20/13 0400 06/21/13 0610 06/22/13 1350 06/23/13 0340  AST 15 14  --   --   --   --   ALT 17 15  --   --   --   --  ALKPHOS 90 87  --   --   --   --   BILITOT 0.4 0.3  --   --   --   --   PROT 7.2 7.1  --   --   --   --   ALBUMIN 2.8* 2.8*  2.8* 2.6* 2.8* 2.8* 3.0*   No results found for this basename: LIPASE, AMYLASE,  in the last 168 hours No results found for this basename: AMMONIA,  in the last 168 hours  CBC:  Recent Labs Lab 06/16/13 1623 06/16/13 1915 06/19/13 0612 06/23/13 0600  WBC 7.6 6.6 6.6 6.1  NEUTROABS 5.2 4.4  --   --   HGB 11.9* 11.8* 11.5* 11.5*  HCT 35.2* 35.4* 36.0 35.4*  MCV 82.1 81.4 85.5 85.3  PLT 136* 127* 136* 113*    Cardiac Enzymes: No results found for this basename: CKTOTAL, CKMB, CKMBINDEX, TROPONINI,  in the last 168 hours  BNP: BNP (last 3  results)  Recent Labs  09/25/12 1743 09/29/12 0530  PROBNP 16961.0* 5247.0*    CBG:  Recent Labs Lab 06/22/13 0814 06/22/13 1134 06/22/13 1710 06/22/13 2058 06/23/13 0820  GLUCAP 80 159* 121* 210* 106*    Coagulation Studies:  Recent Labs  06/23/13 0600  LABPROT 14.7  INR 1.17     Imaging: No results found.   Medications:     Current Medications: . atorvastatin  80 mg Oral q1800  . budesonide-formoterol  2 puff Inhalation BID  . calcitRIOL  0.25 mcg Oral Daily  . calcium acetate  1,334 mg Oral TID WC  . cefUROXime  500 mg Oral Q24H  . clopidogrel  75 mg Oral Q breakfast  . doxycycline  100 mg Oral Q12H  . DULoxetine  60 mg Oral Daily  . furosemide  120 mg Intravenous Q8H  . heparin  5,000 Units Subcutaneous 3 times per day  . insulin aspart  0-5 Units Subcutaneous QHS  . insulin aspart  0-9 Units Subcutaneous TID WC  . insulin glargine  28 Units Subcutaneous QHS  . ketoconazole   Topical BID  . metolazone  5 mg Oral Daily  . pantoprazole  40 mg Oral Daily  . pregabalin  50 mg Oral BID  . sodium chloride  3 mL Intravenous Q12H  . sodium chloride  3 mL Intravenous Q12H    Infusions:     Assessment:   1) A/C systolic HF - EF 16-10%15-20% (2013) 2) Acute on chronic renal failure, stage IV 3) DM2 4) HTN 5) PVD - R fem-pop (12/2012) 6) CAD - s/p CABG 2007  Plan/Discussion:    She is diuresing well with IV lasix. Renal function stable/slightly improved. Will continue IV lasix and metolazone. Hold on RHC for now. If u/o trails off or renal function worse can proceed with RHC to assess need for inotropic support  D/W patient and daughter at bedside as well as Dr. Arrie Aranoladonato.   Daniel Bensimhon,MD 9:02 AM

## 2013-06-23 NOTE — Progress Notes (Addendum)
PROGRESS NOTE  Heidi HoppingMary I [name] JYN:[id number] DOB: 01-Dec-1945 DOA: 06/16/2013 PCP: Heidi JosephsMorrow, [name], MD  Assessment/Plan:   Cellulitis: Improving. Change to PO abx.  UTI- E coli resistant to FQ- outpatient study. Sensitive to rocephin. Asked that cx be faxed.  Changed cipro to ceftin. Cont doxy for cellulitis    Acute renal failure with CKD -Likely due to acute infection and prerenal state, but creatinine not improving with IVF and holding demadex. - Creatinine last year was over 2. Sees Dr. Marisue [name]. Continue current, consult nephrology for recs- plan for possible dialysis ? -cards has patient on IV lasix for further diuresis     HTN (hypertension)    Chronic systolic congestive heart failure, compensated EF 15-20%    PVD (peripheral vascular disease) with claudication: ABI shows right WNL and left with moderate disease- can follow up outpatient with vascular    Chronic kidney disease (CKD), stage 3-4  Tinea pedis: Antifungals topical  Tobacco abuse- encourage cessation  Venous stasis dermatitis  Hyperlipidemia  DM- HgbA1C- 7.9 -SSI TID and QHS   HPI/Subjective: Feeling better today   Objective: Filed Vitals:   06/23/13 1001  BP: 126/69  Pulse: 97  Temp: 97.5 F (36.4 C)  Resp: 19    Intake/Output Summary (Last 24 hours) at 06/23/13 1038 Last data filed at 06/23/13 0900  Gross per 24 hour  Intake    120 ml  Output   2700 ml  Net  -2580 ml   Filed Weights   06/20/13 2049 06/21/13 2208 06/22/13 2059  Weight: 81.647 kg (180 lb) 81.194 kg (179 lb) 81.194 kg (179 lb)    Exam:   General:  Nontoxic.  Cardiovascular: Regular rate rhythm without murmurs gallops rubs  Respiratory: Clear to auscultation bilaterally without wheezes rhonchi or rales  Abdomen: Soft nontender nondistended  Ext: Ulceration noted bilateral pretibial areas.  Both legs hyperpigmented. No pitting edema. Erythema resolved  Basic Metabolic Panel:  Recent Labs Lab  06/19/13 0612 06/20/13 0400 06/21/13 0610 06/22/13 1350 06/23/13 0340 06/23/13 0600  NA 141  141 139 141 139 139 140  K 4.4  4.4 5.0 4.1 3.9 3.7 3.9  CL 108  108 106 105 101 99 100  CO2 17*  17* 18* 20 23 24 23   GLUCOSE 125*  124* 112* 96 168* 144* 125*  BUN 50*  51* 52* 57* 65* 69* 69*  CREATININE 4.01*  4.00* 4.00* 4.03* 4.01* 3.90* 3.93*  CALCIUM 8.2*  8.3* 8.3* 8.6 8.6 9.0 9.0  PHOS 6.8*  6.9* 7.1* 6.7* 6.3* 6.4*  --    Liver Function Tests:  Recent Labs Lab 06/17/13 0335 06/19/13 0612 06/20/13 0400 06/21/13 0610 06/22/13 1350 06/23/13 0340  AST 15 14  --   --   --   --   ALT 17 15  --   --   --   --   ALKPHOS 90 87  --   --   --   --   BILITOT 0.4 0.3  --   --   --   --   PROT 7.2 7.1  --   --   --   --   ALBUMIN 2.8* 2.8*  2.8* 2.6* 2.8* 2.8* 3.0*   No results found for this basename: LIPASE, AMYLASE,  in the last 168 hours No results found for this basename: AMMONIA,  in the last 168 hours CBC:  Recent Labs Lab 06/16/13 1623 06/16/13 1915 06/19/13 0612 06/23/13 0600  WBC 7.6 6.6 6.6  6.1  NEUTROABS 5.2 4.4  --   --   HGB 11.9* 11.8* 11.5* 11.5*  HCT 35.2* 35.4* 36.0 35.4*  MCV 82.1 81.4 85.5 85.3  PLT 136* 127* 136* 113*   Cardiac Enzymes: No results found for this basename: CKTOTAL, CKMB, CKMBINDEX, TROPONINI,  in the last 168 hours BNP (last 3 results)  Recent Labs  09/25/12 1743 09/29/12 0530  PROBNP 16961.0* 5247.0*   CBG:  Recent Labs Lab 06/22/13 0814 06/22/13 1134 06/22/13 1710 06/22/13 2058 06/23/13 0820  GLUCAP 80 159* 121* 210* 106*    Recent Results (from the past 240 hour(s))  CULTURE, BLOOD (ROUTINE X 2)     Status: None   Collection Time    06/16/13  4:43 PM      Result Value Ref Range Status   Specimen Description BLOOD ARM LEFT   Final   Special Requests BOTTLES DRAWN AEROBIC AND ANAEROBIC 5CCBLUE 4CCRED   Final   Culture  Setup Time     Final   Value: 06/16/2013 22:10     Performed at [facility]   Culture     Final   Value: NO GROWTH 5 DAYS     Performed at [facility]   Report Status 06/22/2013 FINAL   Final  CULTURE, BLOOD (ROUTINE X 2)     Status: None   Collection Time    06/16/13  4:53 PM      Result Value Ref Range Status   Specimen Description BLOOD ARM RIGHT   Final   Special Requests BOTTLES DRAWN AEROBIC AND ANAEROBIC 10CC   Final   Culture  Setup Time     Final   Value: 06/16/2013 22:10     Performed at [facility]   Culture     Final   Value: NO GROWTH 5 DAYS     Performed at [facility]   Report Status 06/22/2013 FINAL   Final  URINE CULTURE     Status: None   Collection Time    06/18/13  6:34 PM      Result Value Ref Range Status   Specimen Description URINE, RANDOM   Final   Special Requests NONE   Final   Culture  Setup Time     Final   Value: 06/18/2013 19:27     Performed at [facility] Count     Final   Value: >=100,000 COLONIES/ML     Performed at [facility]   Culture     Final   Value: ESCHERICHIA COLI     Performed at [facility]   Report Status 06/20/2013 FINAL   Final   Organism ID, Bacteria ESCHERICHIA COLI   Final     Studies: No results found.  Scheduled Meds: . atorvastatin  80 mg Oral q1800  . budesonide-formoterol  2 puff Inhalation BID  . calcitRIOL  0.25 mcg Oral Daily  . calcium acetate  1,334 mg Oral TID WC  . cefUROXime  500 mg Oral Q24H  . clopidogrel  75 mg Oral Q breakfast  . doxycycline  100 mg Oral Q12H  . DULoxetine  60 mg Oral Daily  . furosemide  120 mg Intravenous Q8H  . heparin  5,000 Units Subcutaneous 3 times per day  . insulin aspart  0-5 Units Subcutaneous QHS  . insulin aspart  0-9 Units Subcutaneous TID WC  . insulin glargine  28 Units Subcutaneous QHS  . ketoconazole  Topical BID  . metolazone  5 mg Oral Daily  . pantoprazole  40 mg Oral Daily  . pregabalin  50 mg Oral BID  . sodium chloride  3 mL Intravenous Q12H  . sodium  chloride  3 mL Intravenous Q12H   Continuous Infusions:    Time spent: 25 minutes  [name]  [facility] Pager [phone]. If 7PM-7AM, please contact night-coverage at [email or web address], password [facility] 06/23/2013, 10:38 AM  LOS: 7 days

## 2013-06-23 NOTE — Progress Notes (Signed)
Patient ID: [name], female   DOB: 08-12-1945, 68 y.o.   MRN: [id number] S:feels a little better O:BP 140/68  Pulse 96  Temp(Src) 97.5 F (36.4 C) (Oral)  Resp 18  Ht 5' (1.524 m)  Wt 81.194 kg (179 lb)  BMI 34.96 kg/m2  SpO2 100%  Intake/Output Summary (Last 24 hours) at 06/23/13 1421 Last data filed at 06/23/13 1300  Gross per 24 hour  Intake    720 ml  Output   1650 ml  Net   -930 ml   Intake/Output: I/O last 3 completed shifts: In: 360 [P.O.:360] Out: 4250 [Urine:4250]  Intake/Output this shift:  Total I/O In: 600 [P.O.:600] Out: -  Weight change: 0 kg (0 lb) Gen:WD, WN WF in NAD CVS:no rub Resp:decreased BS at bases UUV:OZDGUY Ext:tr edema   Recent Labs Lab 06/17/13 0335 06/18/13 0432 06/19/13 0612 06/20/13 0400 06/21/13 0610 06/22/13 1350 06/23/13 0340 06/23/13 0600  NA 142 139 141  141 139 141 139 139 140  K 3.4* 4.1 4.4  4.4 5.0 4.1 3.9 3.7 3.9  CL 105 106 108  108 106 105 101 99 100  CO2 17* 17* 17*  17* 18* 20 23 24 23   GLUCOSE 99 179* 125*  124* 112* 96 168* 144* 125*  BUN 50* 52* 50*  51* 52* 57* 65* 69* 69*  CREATININE 3.77* 3.87* 4.01*  4.00* 4.00* 4.03* 4.01* 3.90* 3.93*  ALBUMIN 2.8*  --  2.8*  2.8* 2.6* 2.8* 2.8* 3.0*  --   CALCIUM 8.2* 8.2* 8.2*  8.3* 8.3* 8.6 8.6 9.0 9.0  PHOS  --   --  6.8*  6.9* 7.1* 6.7* 6.3* 6.4*  --   AST 15  --  14  --   --   --   --   --   ALT 17  --  15  --   --   --   --   --    Liver Function Tests:  Recent Labs Lab 06/17/13 0335 06/19/13 0612  06/21/13 0610 06/22/13 1350 06/23/13 0340  AST 15 14  --   --   --   --   ALT 17 15  --   --   --   --   ALKPHOS 90 87  --   --   --   --   BILITOT 0.4 0.3  --   --   --   --   PROT 7.2 7.1  --   --   --   --   ALBUMIN 2.8* 2.8*  2.8*  < > 2.8* 2.8* 3.0*  < > = values in this interval not displayed. No results found for this basename: LIPASE, AMYLASE,  in the last 168 hours No results found for this basename: AMMONIA,  in the last 168  hours CBC:  Recent Labs Lab 06/16/13 1623 06/16/13 1915 06/19/13 0612 06/23/13 0600  WBC 7.6 6.6 6.6 6.1  NEUTROABS 5.2 4.4  --   --   HGB 11.9* 11.8* 11.5* 11.5*  HCT 35.2* 35.4* 36.0 35.4*  MCV 82.1 81.4 85.5 85.3  PLT 136* 127* 136* 113*   Cardiac Enzymes: No results found for this basename: CKTOTAL, CKMB, CKMBINDEX, TROPONINI,  in the last 168 hours CBG:  Recent Labs Lab 06/22/13 1134 06/22/13 1710 06/22/13 2058 06/23/13 0820 06/23/13 1224  GLUCAP 159* 121* 210* 106* 137*    Iron Studies: No results found for this basename: IRON, TIBC, TRANSFERRIN, FERRITIN,  in the last 72 hours  Studies/Results: No results found. Marland Kitchen. atorvastatin  80 mg Oral q1800  . budesonide-formoterol  2 puff Inhalation BID  . calcitRIOL  0.25 mcg Oral Daily  . calcium acetate  1,334 mg Oral TID WC  . cefUROXime  500 mg Oral Q24H  . clopidogrel  75 mg Oral Q breakfast  . doxycycline  100 mg Oral Q12H  . DULoxetine  60 mg Oral Daily  . furosemide  120 mg Intravenous Q8H  . heparin  5,000 Units Subcutaneous 3 times per day  . insulin aspart  0-5 Units Subcutaneous QHS  . insulin aspart  0-9 Units Subcutaneous TID WC  . insulin glargine  28 Units Subcutaneous QHS  . ketoconazole   Topical BID  . metolazone  5 mg Oral Daily  . pantoprazole  40 mg Oral Daily  . pregabalin  50 mg Oral BID  . sodium chloride  3 mL Intravenous Q12H  . sodium chloride  3 mL Intravenous Q12H    BMET    Component Value Date/Time   NA 140 06/23/2013 0600   K 3.9 06/23/2013 0600   CL 100 06/23/2013 0600   CO2 23 06/23/2013 0600   GLUCOSE 125* 06/23/2013 0600   BUN 69* 06/23/2013 0600   CREATININE 3.93* 06/23/2013 0600   CALCIUM 9.0 06/23/2013 0600   GFRNONAA 11* 06/23/2013 0600   GFRAA 13* 06/23/2013 0600   CBC    Component Value Date/Time   WBC 6.1 06/23/2013 0600   WBC 9.2 12/19/2012 1601   RBC 4.15 06/23/2013 0600   RBC 4.66 12/19/2012 1601   HGB 11.5* 06/23/2013 0600   HGB 13.7 12/19/2012 1601   HCT 35.4*  06/23/2013 0600   HCT 42.9 12/19/2012 1601   PLT 113* 06/23/2013 0600   MCV 85.3 06/23/2013 0600   MCV 92.0 12/19/2012 1601   MCH 27.7 06/23/2013 0600   MCH 29.4 12/19/2012 1601   MCHC 32.5 06/23/2013 0600   MCHC 31.9 12/19/2012 1601   RDW 16.3* 06/23/2013 0600   LYMPHSABS 1.6 06/16/2013 1915   MONOABS 0.4 06/16/2013 1915   EOSABS 0.2 06/16/2013 1915   BASOSABS 0.0 06/16/2013 1915     Assessment/Plan:  1. 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  1. Appreciate HF team's input.  Diuresing and Scr stable but above baseline.   2. Pt wants to hold off on HD for now. No urgent indication for HD and will cont to follow after diuresis and possible inotropes depending upon RHC results.    3. Will need vein mapping and placement of AVG/AVF regardless of her needing HD this admission. Will consult [facility] in am CHF- acute on chronic systolic- diuresing with po lasix. As above, consulted Heart Failure team and awaiting RHC results for further guidance in treating her CHF.  Starting to see a rise in Scr so RHC may be helpful 2. ACDz- follow 3. SHPTH: on vit D and binders 4. UTI- on ceftin and doxy (cellulitis resolved) 5. HTN 6. DM- per primary svc 7. Vascular access- will need vein mapping and longterm access and possible PC during this hospitalization depending upon her renal function 8.   Dionysios Massman A

## 2013-06-24 ENCOUNTER — Inpatient Hospital Stay (HOSPITAL_COMMUNITY): Payer: Medicare Other

## 2013-06-24 ENCOUNTER — Encounter (HOSPITAL_COMMUNITY)
Admission: EM | Disposition: A | Discharge status: Hospice | Payer: Self-pay | Source: Home / Self Care | Attending: Internal Medicine

## 2013-06-24 DIAGNOSIS — I2789 Other specified pulmonary heart diseases: Secondary | ICD-10-CM

## 2013-06-24 DIAGNOSIS — J9600 Acute respiratory failure, unspecified whether with hypoxia or hypercapnia: Secondary | ICD-10-CM

## 2013-06-24 DIAGNOSIS — I5021 Acute systolic (congestive) heart failure: Secondary | ICD-10-CM

## 2013-06-24 DIAGNOSIS — I272 Pulmonary hypertension, unspecified: Secondary | ICD-10-CM

## 2013-06-24 DIAGNOSIS — F17200 Nicotine dependence, unspecified, uncomplicated: Secondary | ICD-10-CM

## 2013-06-24 DIAGNOSIS — N19 Unspecified kidney failure: Secondary | ICD-10-CM

## 2013-06-24 DIAGNOSIS — I509 Heart failure, unspecified: Secondary | ICD-10-CM

## 2013-06-24 DIAGNOSIS — J449 Chronic obstructive pulmonary disease, unspecified: Secondary | ICD-10-CM

## 2013-06-24 HISTORY — PX: RIGHT HEART CATHETERIZATION: SHX5447

## 2013-06-24 LAB — GLUCOSE, CAPILLARY
GLUCOSE-CAPILLARY: 56 mg/dL — AB (ref 70–99)
GLUCOSE-CAPILLARY: 112 mg/dL — AB (ref 70–99)
GLUCOSE-CAPILLARY: 102 mg/dL — AB (ref 70–99)
Glucose-Capillary: 96 mg/dL (ref 70–99)
Glucose-Capillary: 45 mg/dL — ABNORMAL LOW (ref 70–99)
Glucose-Capillary: 99 mg/dL (ref 70–99)

## 2013-06-24 LAB — RENAL FUNCTION PANEL
Albumin: 2.8 g/dL — ABNORMAL LOW (ref 3.5–5.2)
BUN: 79 mg/dL — ABNORMAL HIGH (ref 6–23)
CO2: 25 mEq/L (ref 19–32)
Calcium: 9.1 mg/dL (ref 8.4–10.5)
Chloride: 96 mEq/L (ref 96–112)
Creatinine, Ser: 3.97 mg/dL — ABNORMAL HIGH (ref 0.50–1.10)
GFR calc non Af Amer: 11 mL/min — ABNORMAL LOW (ref >90)
GFR, EST AFRICAN AMERICAN: 12 mL/min — AB (ref >90)
Glucose, Bld: 72 mg/dL (ref 70–99)
PHOSPHORUS: 6.6 mg/dL — AB (ref 2.3–4.6)
POTASSIUM: 3.5 meq/L — AB (ref 3.7–5.3)
SODIUM: 137 meq/L (ref 137–147)

## 2013-06-24 LAB — POCT I-STAT 3, VENOUS BLOOD GAS (G3P V)
ACID-BASE EXCESS: 2 mmol/L (ref 0.0–2.0)
Acid-Base Excess: 1 mmol/L (ref 0.0–2.0)
BICARBONATE: 30 meq/L — AB (ref 20.0–24.0)
Bicarbonate: 29 mEq/L — ABNORMAL HIGH (ref 20.0–24.0)
O2 Saturation: 50 %
O2 Saturation: 51 %
PCO2 VEN: 63.1 mmHg — AB (ref 45.0–50.0)
PH VEN: 7.271 (ref 7.250–7.300)
PO2 VEN: 32 mmHg (ref 30.0–45.0)
TCO2: 31 mmol/L (ref 0–100)
TCO2: 32 mmol/L (ref 0–100)
pCO2, Ven: 63.9 mmHg — ABNORMAL HIGH (ref 45.0–50.0)
pH, Ven: 7.279 (ref 7.250–7.300)
pO2, Ven: 31 mmHg (ref 30.0–45.0)

## 2013-06-24 LAB — CREATININE, SERUM
CREATININE: 3.86 mg/dL — AB (ref 0.50–1.10)
GFR calc Af Amer: 13 mL/min — ABNORMAL LOW (ref >90)
GFR, EST NON AFRICAN AMERICAN: 11 mL/min — AB (ref >90)

## 2013-06-24 LAB — CBC
HCT: 33.1 % — ABNORMAL LOW (ref 36.0–46.0)
HEMOGLOBIN: 10.8 g/dL — AB (ref 12.0–15.0)
MCH: 27.3 pg (ref 26.0–34.0)
MCHC: 32.6 g/dL (ref 30.0–36.0)
MCV: 83.6 fL (ref 78.0–100.0)
PLATELETS: 117 10*3/uL — AB (ref 150–400)
RBC: 3.96 MIL/uL (ref 3.87–5.11)
RDW: 15.8 % — ABNORMAL HIGH (ref 11.5–15.5)
WBC: 6.2 10*3/uL (ref 4.0–10.5)

## 2013-06-24 SURGERY — RIGHT HEART CATH
Anesthesia: LOCAL

## 2013-06-24 MED ORDER — SODIUM CHLORIDE 0.9 % IJ SOLN: 3.0000 mL | INTRAMUSCULAR | Status: DC | PRN

## 2013-06-24 MED ORDER — FUROSEMIDE 10 MG/ML IJ SOLN
80.0000 mg | Freq: Two times a day (BID) | INTRAMUSCULAR | Status: DC
  Administered 2013-06-24 – 2013-06-26 (×4): 80 mg via INTRAVENOUS
  Filled 2013-06-24 (×7): qty 8

## 2013-06-24 MED ORDER — POTASSIUM CHLORIDE CRYS ER 20 MEQ PO TBCR
20.0000 meq | EXTENDED_RELEASE_TABLET | Freq: Once | ORAL | Status: AC
  Administered 2013-06-24: 20 meq via ORAL
  Filled 2013-06-24: qty 1

## 2013-06-24 MED ORDER — ASPIRIN 81 MG PO CHEW: 81.0000 mg | CHEWABLE_TABLET | ORAL | Status: DC

## 2013-06-24 MED ORDER — SODIUM CHLORIDE 0.9 % IV SOLN
250.0000 mL | INTRAVENOUS | Status: DC | PRN
  Administered 2013-06-25: 250 mL via INTRAVENOUS
  Administered 2013-06-29: 08:00:00 via INTRAVENOUS

## 2013-06-24 MED ORDER — LIDOCAINE HCL (PF) 1 % IJ SOLN
INTRAMUSCULAR | Status: AC
  Filled 2013-06-24: qty 30

## 2013-06-24 MED ORDER — HEPARIN (PORCINE) IN NACL 2-0.9 UNIT/ML-% IJ SOLN
INTRAMUSCULAR | Status: AC
  Filled 2013-06-24: qty 500

## 2013-06-24 MED ORDER — SODIUM CHLORIDE 0.9 % IV SOLN: 250.0000 mL | INTRAVENOUS | Status: DC | PRN

## 2013-06-24 MED ORDER — SODIUM CHLORIDE 0.9 % IJ SOLN: 3.0000 mL | Freq: Two times a day (BID) | INTRAMUSCULAR | Status: DC

## 2013-06-24 MED ORDER — MILRINONE IN DEXTROSE 20 MG/100ML IV SOLN
0.1250 ug/kg/min | INTRAVENOUS | Status: DC
  Administered 2013-06-24 – 2013-06-30 (×7): 0.25 ug/kg/min via INTRAVENOUS
  Administered 2013-06-30 – 2013-07-01 (×2): 0.125 ug/kg/min via INTRAVENOUS
  Filled 2013-06-24 (×11): qty 100

## 2013-06-24 MED ORDER — DEXTROSE 50 % IV SOLN
INTRAVENOUS | Status: AC
  Administered 2013-06-24: 25 mL
  Filled 2013-06-24: qty 50

## 2013-06-24 MED ORDER — SODIUM CHLORIDE 0.9 % IJ SOLN
10.0000 mL | INTRAMUSCULAR | Status: DC | PRN
  Administered 2013-06-24: 60 mL
  Administered 2013-06-25 (×3): 10 mL
  Administered 2013-06-25: 30 mL
  Administered 2013-06-25: 10 mL
  Administered 2013-06-25: 30 mL
  Administered 2013-06-26: 20 mL
  Administered 2013-06-26: 10 mL
  Administered 2013-06-27: 20 mL
  Administered 2013-06-28 (×4): 10 mL
  Administered 2013-06-29: 20 mL
  Administered 2013-06-29 – 2013-07-01 (×3): 10 mL
  Administered 2013-07-01: 20 mL
  Administered 2013-07-01: 10 mL

## 2013-06-24 MED ORDER — MIDAZOLAM HCL 2 MG/2ML IJ SOLN
INTRAMUSCULAR | Status: AC
  Filled 2013-06-24: qty 2

## 2013-06-24 MED ORDER — HEPARIN SODIUM (PORCINE) 5000 UNIT/ML IJ SOLN: 5000.0000 [IU] | Freq: Three times a day (TID) | INTRAMUSCULAR | Status: DC

## 2013-06-24 MED ORDER — DEXTROSE 50 % IV SOLN
25.0000 mL | Freq: Once | INTRAVENOUS | Status: AC | PRN
  Administered 2013-06-24: 25 mL via INTRAVENOUS

## 2013-06-24 NOTE — Consult Note (Addendum)
PULMONARY / CRITICAL CARE MEDICINE  Name: Heidi HoppingMary I [name] MRN: [id number] DOB: 08/18/1945    ADMISSION DATE:  06/16/2013 CONSULTATION DATE:  06/24/2013  [facility]  [facility]  CHIEF COMPLAINT:  Pulmonary hypertension ( new )  BRIEF PATIENT DESCRIPTION: 68 year old female with CHF (EF 15-20%) admitted 2/10 for AKI and cellulitis. Had R sided cardiac cath 2/18, which showed moderate pulmonary HTN. PCCM asked to consult.   SIGNIFICANT EVENTS / STUDIES:  10/2011  Echo > LVEF 15-20% 2/10      [facility]      R heart cath: PCWP=17, PAP 70/37 ( 47 )  LINE / TUBES: RIJ TLC 2/18 >>>  CULTURES: 2/10 Blood >>> 2/12 Urine > E.COLI  ANTIBIOTICS: Vanc (pre-admit) >>> 2/12 Cipro 2/10 >>> 2/13 Cefuroxime 2/13 >>> 2/18 Doxy 2/12 >>>  HISTORY OF PRESENT ILLNESS:  68 y.o. year old female with peripheral vascular disease, coronary artery disease status post multiple MIs status post CABG, CVA, type 2 diabetes, CHF, chronic renal insufficiency with a baseline creatinine of 2.7 who presented with cellulitis and acute kidney injury. Cellulitis started on the left anterior shin about 7-10 days prior to admission. Patient denies any known trauma prior to wound. Area became progressively red and irritated. Some subjective fevers and chills at home. Of note, patient had a very extended hospitalization for essentially the entire month of October for nonhealing right groin wound. Was seen by her PCP for symptoms 2/10. Was sent for a lower extremity ultrasound as well as left tib-fib x-ray and blood work. Lower extremity ultrasound was negative for DVT. However, tib-fib was indicative of soft tissue swelling consistent with cellulitis. She was sent to ED. She was found to have AKI and was started on ABX for cellulitis. 2/12 she was found to have UTI (e.coli). She has been diuresed and treated for the cellulitis this admission, but there is some concern she will soon require dialysis. On  2/18 she had a R heart cath which found pulmonary HTN, PCCM asked to see. She was started on milrinone.   PAST MEDICAL HISTORY :  Past Medical History  Diagnosis Date  . Coronary artery disease     a. s/p CABG 2007 (L-RI, S-dRCA, S-LAD, S-OM);  b. myoview 6/13:  anteroapical MI, no ischemia, EF 31%  . Diabetes mellitus   . Hypertension   . Asthma   . Bronchitis   . Reflux   . Hyperlipidemia   . Chronic systolic heart failure     a. echo 6/13: mild LVH, EF 15-20%, diff HK, Gr 1 DD, mild reduced RVSF, PASP 32.  Marland Kitchen. GERD (gastroesophageal reflux disease)   . Arthritis   . CKD (chronic kidney disease)   . Chronic back pain   . Hypoxia     a. Adm 09/2012 - discharged with home O2 in setting of CHF.  Marland Kitchen. Abnormal stress test     a. Nuc 10/2011: bright target which may be in left breast or L chest wall or axilla. Pt reports she f/u with PCP and mammogram was OK (left breast cyst).  . Complication of anesthesia     slow to awaken  . 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 Anginal pain     "rarely, had some 2 months ago, not sure if heart or arm, cardiologist aware.  . CHF (congestive heart failure)   . Shortness of breath     at times- sensitive to perfume.  .Marland Kitchen  Peripheral vascular disease   . Depression   . Stroke 05/2011    a. R ischemic CVA 05/2011. Right hand weakness.  . Pneumonia     2010ish  . Neuropathic pain     feet and hands  . Polio     as a child  . Altered level of consciousness     2 weeks now  . Cellulitis 06/2013    LEFT LOWER EXTREMITY   Past Surgical History  Procedure Laterality Date  . Coronary artery bypass graft      LIMA to RI, SVG to distal RCA, SVG to LAD, SVG to OM. 2007  . Joint replacement      Right hip replacement   . Debridement skin / sq / muscle of trunk      s/p staph infection from CABG 2007  . Cystectomy      Subcutaneous  . Partial hip arthroplasty    . Stroke    . Femoral-popliteal bypass graft Right 01/20/2013    Procedure: BYPASS  GRAFT RIGHT FEMORAL-BELOW KNEE POPLITEAL ARTERY WITH RINGED GORTEX GRAFT ;  Surgeon: Sherren Kerns, MD;  Location: [facility] OR;  Service: Vascular;  Laterality: Right;  . Patch angioplasty Right 01/20/2013    Procedure: VEIN  PATCH ANGIOPLASTY;  Surgeon: Sherren Kerns, MD;  Location: [facility] OR;  Service: Vascular;  Laterality: Right;  . I&d extremity Right 02/06/2013    Procedure: DEBRIDEMENT RIGHT GROIN WOUND & APPLICATION OF WOUND VAC.;  Surgeon: Sherren Kerns, MD;  Location: [facility] OR;  Service: Vascular;  Laterality: Right;  . Groin debridement Right 02/11/2013    Procedure: GROIN DEBRIDEMENT & WOUND VAC APPLICATION;  Surgeon: Sherren Kerns, MD;  Location: [facility] OR;  Service: Vascular;  Laterality: Right;  . I&d extremity Right 02/23/2013    Procedure: IRRIGATION AND DEBRIDEMENT RIGHT GROIN;  Surgeon: Sherren Kerns, MD;  Location: [facility] OR;  Service: Vascular;  Laterality: Right;  . Application of wound vac Right 02/23/2013    Procedure: APPLICATION OF WOUND VAC;  Surgeon: Sherren Kerns, MD;  Location: [facility] OR;  Service: Vascular;  Laterality: Right;  . Hematoma evacuation Right 02/12/2013    Procedure: EVACUATION HEMATOMA;  Surgeon: Fransisco Hertz, MD;  Location: [facility] OR;  Service: Vascular;  Laterality: Right;   Prior to Admission medications   Medication Sig Start Date End Date Taking? Authorizing Provider  albuterol (PROVENTIL HFA;VENTOLIN HFA) 108 (90 BASE) MCG/ACT inhaler Inhale 2 puffs into the lungs every 6 (six) hours as needed for wheezing or shortness of breath.    Yes Historical Provider, MD  ALPRAZolam Prudy Feeler) 0.5 MG tablet Take 0.5 mg by mouth daily as needed for anxiety.    Yes Historical Provider, MD  budesonide-formoterol (SYMBICORT) 160-4.5 MCG/ACT inhaler Inhale 2 puffs into the lungs 2 (two) times daily.    Yes Historical Provider, MD  clopidogrel (PLAVIX) 75 MG tablet Take 75 mg by mouth daily with breakfast.   Yes Historical Provider, MD  diphenhydrAMINE (BENADRYL) 25 mg capsule  Take 25 mg by mouth every 6 (six) hours as needed for allergies.    Yes Historical Provider, MD  DULoxetine (CYMBALTA) 60 MG capsule Take 60 mg by mouth daily.   Yes Historical Provider, MD  insulin glargine (LANTUS) 100 UNIT/ML injection Inject 28 Units into the skin at bedtime.    Yes Historical Provider, MD  insulin lispro (HUMALOG) 100 UNIT/ML injection Inject 6-11 Units into the skin 3 (three) times daily before meals. Per sliding scale  Yes Historical Provider, MD  nitroGLYCERIN (NITROSTAT) 0.4 MG SL tablet Place 0.4 mg under the tongue every 5 (five) minutes as needed for chest pain.    Yes Historical Provider, MD  pantoprazole (PROTONIX) 40 MG tablet Take 40 mg by mouth daily.   Yes Historical Provider, MD  pregabalin (LYRICA) 50 MG capsule Take 50 mg by mouth 2 (two) times daily.   Yes Historical Provider, MD  rosuvastatin (CRESTOR) 40 MG tablet Take 40 mg by mouth daily.   Yes Historical Provider, MD  torsemide (DEMADEX) 20 MG tablet Take 20 mg by mouth 2 (two) times daily. 2 tab daily by mouth 02/27/13  Yes Lars Mage, PA-C  ciprofloxacin (CIPRO) 750 MG tablet Take 750 mg by mouth daily with breakfast. Finished sometime in January per patient 03/19/13   Sherren Kerns, MD   Allergies  Allergen Reactions  . Sulfa Antibiotics Anaphylaxis and Shortness Of Breath  . Codeine Nausea Only and Other (See Comments)    Severe headache  . Ivp Dye [Iodinated Diagnostic Agents] Hives and Other (See Comments)    Severe anxiety  . Penicillins Swelling and Other (See Comments)    "huge sores". Pt has tolerated both Zosyn and Keflex in the past.   FAMILY HISTORY:  Family History  Problem Relation Age of Onset  . Coronary artery disease Father 15    Unclear if it was an MI  . Stroke Mother 73  . Coronary artery disease Brother 51  . Coronary artery disease Brother 39   SOCIAL HISTORY:  reports that she quit smoking about 5 months ago. Her smoking use included Cigarettes. She smoked  0.00 packs per day for 50 years. She has never used smokeless tobacco. She reports that she does not drink alcohol or use illicit drugs.  REVIEW OF SYSTEMS:   Bolds are positive  Constitutional: weight loss, gain, night sweats, Fevers, chills, fatigue .  HEENT: headaches, Sore throat, sneezing, nasal congestion, post nasal drip, Difficulty swallowing, Tooth/dental problems, visual complaints visual changes, ear ache CV:  chest pain, radiates: ,Orthopnea, PND, swelling in lower extremities, dizziness, palpitations, syncope.  GI  heartburn, indigestion, abdominal pain, nausea, vomiting, diarrhea, change in bowel habits, loss of appetite, bloody stools.  Resp: cough, productive: , hemoptysis, dyspnea, chest pain, pleuritic.  Skin: rash or itching or icterus GU: dysuria, change in color of urine, urgency or frequency. flank pain, hematuria  MS: joint pain or swelling. decreased range of motion  Psych: change in mood or affect. depression or anxiety.  Neuro: difficulty with speech, weakness, numbness, ataxia   SUBJECTIVE:   VITAL SIGNS: Temp:  [97.4 F (36.3 C)-98.3 F (36.8 C)] 97.5 F (36.4 C) (02/18 1406) Pulse Rate:  [87-103] 95 (02/18 1406) Resp:  [16-19] 16 (02/18 1406) BP: (132-156)/(73-78) 147/73 mmHg (02/18 1406) SpO2:  [86 %-99 %] 93 % (02/18 1406) Weight:  [76.658 kg (169 lb)-77.701 kg (171 lb 4.8 oz)] 76.658 kg (169 lb) (02/18 1040)  PHYSICAL EXAMINATION: General:  Resting comfortably, appears to be in no acute distress Neuro:  Awake, alert, cooperative with examination, non-focal HEENT:  NCAT, PERRL, moist membranes Neck:  Soft, no bruits, no lymphadenopathy, no carotid btuits Cardiovascular:  RRR, no m/r/g Lungs:  Diminished bibasilar Abdomen:  Soft, nontender, bowel sounds present, no organomegaly Musculoskeletal:  Moves all extremities, no edema, no digital clubbing, wound to anterior LLE and RLE Skin:  Intact, no rash   Recent Labs Lab 06/23/13 0340  06/23/13 0600 06/24/13 0533  NA 139 140  137  K 3.7 3.9 3.5*  CL 99 100 96  CO2 24 23 25   BUN 69* 69* 79*  CREATININE 3.90* 3.93* 3.97*  GLUCOSE 144* 125* 72    Recent Labs Lab 06/19/13 0612 06/23/13 0600  HGB 11.5* 11.5*  HCT 36.0 35.4*  WBC 6.6 6.1  PLT 136* 113*   Imaging No results found.  ASSESSMENT / PLAN:   Acute / chronic systolic congestive heart failure ( EF 15-20%). LLE cellulitis, but no evidence of DVT by venous Doppler. Tobacco use disorder and possible COPD, however no evidence of significant hypoxia; on room air at home. No physical features or history suggestive of sleep disordered breathing or connective tissue disorder.  Pulmonary arterial hypertension ( by R side cath PAP 70/37 (47) ).  This is most likely secondary to left sided heart failure.  -->  Further workup is unlikely to be revealing. -->  If desired would check full PFT ( r/o pulmonary disease ), VQ scan ( r/o thromboembolic disease ), polysomnography ( r/o OSA ), ANA / ANCA titer ( r/o connective tissue disease ), and HIV serology. -->  If outpatient f/u is desired would schedule with Dr. Delton Coombes or Dr. Kendrick Fries from [facility]. -->  Tobacco cessation. -->  PCCM will sign off, please reconsult as needed.  Joneen Roach, ACNP  Pulmonology/Critical Care Pager [phone] or [phone]  I have personally obtained history, examined patient, evaluated and interpreted laboratory and imaging results, reviewed medical records, formulated assessment / plan and placed orders.  Lonia Farber, MD Pulmonary and Critical Care Medicine [facility] Pager: [phone]  06/24/2013, 3:50 PM

## 2013-06-24 NOTE — Progress Notes (Signed)
Left TL-IJ placed in the cath lab by Sm[place]ie.  He verbalized that the cath was placed under fluoroscopy and the tip lies in the SVC.   [name]

## 2013-06-24 NOTE — Progress Notes (Signed)
Hypoglycemic Event  CBG: 56  Treatment: D50 IV 25 mL  Symptoms: Shaky  Follow-up CBG: Time: 08:05 CBG Result:96  Possible Reasons for Event: Inadequate meal intake  Comments/MD notified: Dr. Arbutus Leas notified- changed patient from NPO to cardiac diet    Noreene Larsson A  Remember to initiate Hypoglycemia Order Set & complete

## 2013-06-24 NOTE — Progress Notes (Signed)
Hypoglycemic Event  CBG: 45  Treatment: D50 IV 25 mL  Symptoms: Sweaty  Follow-up CBG: Time: 17:30  CBG Result: 99  Possible Reasons for Event: Inadequate meal intake  Comments/MD notified: Dr. Arbutus Leas notified, Lantus at bedtime discontinued    [name] A  Remember to initiate Hypoglycemia Order Set & complete

## 2013-06-24 NOTE — Interval H&P Note (Signed)
History and Physical Interval Note:  06/24/2013 11:34 AM  [name]  has presented today for surgery, with the diagnosis of hf  The various methods of treatment have been discussed with the patient and family. After consideration of risks, benefits and other options for treatment, the patient has consented to  Procedure(s): RIGHT HEART CATH (N/A) as a surgical intervention .  The patient's history has been reviewed, patient examined, no change in status, stable for surgery.  I have reviewed the patient's chart and labs.  Questions were answered to the patient's satisfaction.     Daniel Bensimhon

## 2013-06-24 NOTE — H&P (View-Only) (Signed)
Advanced Heart Failure Team Consult Note  Referring Physician: Dr. Arrie Aran Primary Physician: Dr. Hale Bogus Primary Cardiologist:  Dr. Melburn Popper  Reason for Consultation: Heart Failure   HPI:    Ms Bonfiglio is a 68 yo female with a history of CAD s/p CABG (2007), ICM, chronic systolic HF EF ~25%, HTN, DM2, PVD s/p R fem-pop (12/2012) complicated by necrotic anterior abdominal wall fascia and lateral soft tissues and CKD stage III-IV. Last myoview was 10/2011 which was negative for ischemia. 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 Over the past 6 months reports a marked decrease in functional capacity. She has had 3 readmissions in the past 6 months. Complaints of increased weight gain ~ 30 lbs over past 3-4 months, SOB, CP and edema. Her kidney function has continued to decline as well. Has had several episodes of exertional chest pressure recently.   Baseline CR  5/14 Cr 1.4 9/14 Cr 1.4 10/14 Episode acute renal failure 4.5->2.1 2/15  Admit Cr 3.77-> 4.01-> 3.9  Started on IV lasix yesterday with metolazone. Has diuresed well over night -2.5L. Cr slightly better. Denies dyspnea.   Objective:    Vital Signs:   Temp:  [97.7 F (36.5 C)-97.9 F (36.6 C)] 97.9 F (36.6 C) (02/17 0625) Pulse Rate:  [94-95] 95 (02/17 0625) Resp:  [18-20] 18 (02/17 0625) BP: (137-154)/(75-80) 154/80 mmHg (02/17 0625) SpO2:  [90 %-97 %] 93 % (02/17 0625) Weight:  [81.194 kg (179 lb)] 81.194 kg (179 lb) (02/16 2059) Last BM Date: 06/20/13  Weight change: Filed Weights   06/20/13 2049 06/21/13 2208 06/22/13 2059  Weight: 81.647 kg (180 lb) 81.194 kg (179 lb) 81.194 kg (179 lb)    Intake/Output:   Intake/Output Summary (Last 24 hours) at 06/23/13 0902 Last data filed at 06/23/13 2863  Gross per 24 hour  Intake    120 ml  Output   2700 ml  Net  -2580 ml     Physical Exam: General:   Elderly. Lying in bed No resp difficulty HEENT: normal Neck: supple. JVP 10. Carotids 2+ bilat; no bruits. No lymphadenopathy or thryomegaly appreciated. Cor: PMI nondisplaced. Regular rate & rhythm. No rubs, gallops or murmurs. Lungs: clear Abdomen: soft, nontender, + distended. No hepatosplenomegaly. No bruits or masses. Good bowel sounds. Extremities: no cyanosis, clubbing, rash, 2-3 + bilateral edema Neuro: alert & orientedx3, cranial nerves grossly intact. moves all 4 extremities w/o difficulty. Affect pleasant   Labs: Basic Metabolic Panel:  Recent Labs Lab 06/19/13 0612 06/20/13 0400 06/21/13 0610 06/22/13 1350 06/23/13 0340 06/23/13 0600  NA 141  141 139 141 139 139 140  K 4.4  4.4 5.0 4.1 3.9 3.7 3.9  CL 108  108 106 105 101 99 100  CO2 17*  17* 18* 20 23 24 23   GLUCOSE 125*  124* 112* 96 168* 144* 125*  BUN 50*  51* 52* 57* 65* 69* 69*  CREATININE 4.01*  4.00* 4.00* 4.03* 4.01* 3.90* 3.93*  CALCIUM 8.2*  8.3* 8.3* 8.6 8.6 9.0 9.0  PHOS 6.8*  6.9* 7.1* 6.7* 6.3* 6.4*  --     Liver Function Tests:  Recent Labs Lab 06/17/13 0335 06/19/13 0612 06/20/13 0400 06/21/13 0610 06/22/13 1350 06/23/13 0340  AST 15 14  --   --   --   --   ALT 17 15  --   --   --   --  ALKPHOS 90 87  --   --   --   --   BILITOT 0.4 0.3  --   --   --   --   PROT 7.2 7.1  --   --   --   --   ALBUMIN 2.8* 2.8*  2.8* 2.6* 2.8* 2.8* 3.0*   No results found for this basename: LIPASE, AMYLASE,  in the last 168 hours No results found for this basename: AMMONIA,  in the last 168 hours  CBC:  Recent Labs Lab 06/16/13 1623 06/16/13 1915 06/19/13 0612 06/23/13 0600  WBC 7.6 6.6 6.6 6.1  NEUTROABS 5.2 4.4  --   --   HGB 11.9* 11.8* 11.5* 11.5*  HCT 35.2* 35.4* 36.0 35.4*  MCV 82.1 81.4 85.5 85.3  PLT 136* 127* 136* 113*    Cardiac Enzymes: No results found for this basename: CKTOTAL, CKMB, CKMBINDEX, TROPONINI,  in the last 168 hours  BNP: BNP (last 3  results)  Recent Labs  09/25/12 1743 09/29/12 0530  PROBNP 16961.0* 5247.0*    CBG:  Recent Labs Lab 06/22/13 0814 06/22/13 1134 06/22/13 1710 06/22/13 2058 06/23/13 0820  GLUCAP 80 159* 121* 210* 106*    Coagulation Studies:  Recent Labs  06/23/13 0600  LABPROT 14.7  INR 1.17     Imaging: No results found.   Medications:     Current Medications: . atorvastatin  80 mg Oral q1800  . budesonide-formoterol  2 puff Inhalation BID  . calcitRIOL  0.25 mcg Oral Daily  . calcium acetate  1,334 mg Oral TID WC  . cefUROXime  500 mg Oral Q24H  . clopidogrel  75 mg Oral Q breakfast  . doxycycline  100 mg Oral Q12H  . DULoxetine  60 mg Oral Daily  . furosemide  120 mg Intravenous Q8H  . heparin  5,000 Units Subcutaneous 3 times per day  . insulin aspart  0-5 Units Subcutaneous QHS  . insulin aspart  0-9 Units Subcutaneous TID WC  . insulin glargine  28 Units Subcutaneous QHS  . ketoconazole   Topical BID  . metolazone  5 mg Oral Daily  . pantoprazole  40 mg Oral Daily  . pregabalin  50 mg Oral BID  . sodium chloride  3 mL Intravenous Q12H  . sodium chloride  3 mL Intravenous Q12H    Infusions:     Assessment:   1) A/C systolic HF - EF 15-20% (2013) 2) Acute on chronic renal failure, stage IV 3) DM2 4) HTN 5) PVD - R fem-pop (12/2012) 6) CAD - s/p CABG 2007  Plan/Discussion:    She is diuresing well with IV lasix. Renal function stable/slightly improved. Will continue IV lasix and metolazone. Hold on RHC for now. If u/o trails off or renal function worse can proceed with RHC to assess need for inotropic support  D/W patient and daughter at bedside as well as Dr. Coladonato.   Apolonia Ellwood,MD 9:02 AM     

## 2013-06-24 NOTE — Progress Notes (Signed)
[facility] PROGRESS NOTE  ELGENE KOVALCHUK XUX:[id number] DOB: [birth year] DOA: 06/16/2013 PCP: [name], MD  Assessment/Plan: Cellulitis: - Improving. Continue doxycycline 2 additional days to complete 10 days of therapy  UTI - E coli resistant to FQ- outpatient study. Sensitive to rocephin. Asked that cx be faxed. Changed cipro to ceftin. 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  - Creatinine last year was over 2. Sees Dr. Marisue Humble -Appreciate nephrology followup -May need permacath  -cards has patient on IV lasix for further diuresis    Acute on Chronic systolic and diastolic congestive heart failure -EF 15-20%  -Continue furosemide 120 mg every 8 hours  IV and Zaroxolyn -Appreciate Dr. Gala Romney followup -right heart catheterization--markedly elevated right heart pressures with moderate pulmonary hypertension -V/Q scan  When pt recovers from sedation as recommended by Dr. Gala Romney PVD (peripheral vascular disease) with claudication:  -ABI shows right WNL and left with moderate disease- can follow up outpatient with vascular  Tinea pedis:  Antifungals topical  Tobacco abuse -encourage cessation  Venous stasis dermatitis  Hyperlipidemia  -Continue Lipitor DM2 -HgbA1C- 7.9  -SSI TID and QHS -Continue Lantus  Family Communication:    Granddaughterat beside Disposition Plan:   Home when medically stable      Procedures/Studies: Dg Chest 2 View  06/18/2013   CLINICAL DATA:  Shortness of breath, history of CHF  EXAM: CHEST  2 VIEW  COMPARISON:  DG CHEST 1V PORT dated 02/11/2013  FINDINGS: Low lung volumes. Cardiac silhouette is enlarged. Patient is status post median sternotomy coronary artery bypass grafting. There is diffuse prominence of the interstitial markings and mild  peribronchial cuffing. Areas of increased density projects within the lung bases. No focal regions of consolidation appreciated. There is blunting of the costophrenic angles. The osseous structures unremarkable.  IMPRESSION: Interstitial findings likely representing pulmonary edema. A component of chronic bronchitic changes also a diagnostic consideration. Bibasilar infiltrate versus atelectasis. Surveillance evaluation recommended.   Electronically Signed   By: [name] M.D.   On: 06/18/2013 18:16   Dg Tibia/fibula Left  06/16/2013   CLINICAL DATA:  68 year old female with lower extremity soft tissue injury, abrasions/burning, infected. Initial encounter.  EXAM: LEFT TIBIA AND FIBULA - 2 VIEW  COMPARISON:  Left lower extremity venous ultrasound from the same day reported separately.  FINDINGS: Small medial surgical clips near the level of the knee joint. Evidence of widespread subcutaneous stranding and increased density at the level of the tib-fib. Questionable increased striated pattern of muscles in the calf. No subcutaneous gas. Calcified atherosclerosis. No radiopaque foreign body identified.  No acute fracture or dislocation identified in the tibia or fibula. Bulky chronic degenerative spurring/heterotopic ossification at the tibial tuberosity. No cortical osteolysis identified.  IMPRESSION: 1. Increased soft tissue density compatible with cellulitis in this clinical setting. No subcutaneous gas or retained foreign body identified. 2. No acute osseous abnormality identified. Advanced chronic posttraumatic / degenerative changes at the tibial tuberosity.   Electronically Signed   By: Augusto Gamble M.D.   On: 06/16/2013 15:12   US Renal  06/18/2013   CLINICAL DATA:  An elevated creatinine. History of chronic renal disease  EXAM: RENAL/URINARY TRACT ULTRASOUND COMPLETE  COMPARISON:  DG ABD PORTABLE 1V dated 02/12/2013; US RENAL dated 02/09/2013  FINDINGS: Right Kidney:  Length: 9.1 cm. There is decreased  corticomedullary  differentiation. A small benign-appearing 0.7 x 0.4 x 0.7 cm cyst is identified within the midpole periphery the right kidney. There is no evidence of solid-appearing masses, hydronephrosis nor calculi.  Left Kidney:  Length: 11.9 cm. There is decreased corticomedullary differentiation. There is no evidence of hydronephrosis, solid or cystic masses, nor calculi.  Bladder:  Urinary bladder is decompressed.  IMPRESSION: Increased cortical echogenicity bilaterally. Cysts consistent with patient's history of chronic renal disease. There is no evidence of obstructive uropathy no calculi. Small benign-appearing cyst is appreciated within the midpole region right kidney.   Electronically Signed   By: [name] M.D.   On: 06/18/2013 18:29   US Venous Img Lower Unilateral Left  06/16/2013   CLINICAL DATA:  Soft tissue edema  EXAM: LEFT LOWER EXTREMITY VENOUS DUPLEX ULTRASOUND  TECHNIQUE: Gray-scale sonography with graded compression, as well as color Doppler and duplex ultrasound, were performed to evaluate the deep venous system from the level of the common femoral vein through the popliteal and proximal calf veins. Spectral Doppler was utilized to evaluate flow at rest and with distal augmentation maneuvers.  COMPARISON:  None.  FINDINGS: Flow in the venous structures of the left lower extremity is spontaneous and phasic in all segments. There is normal compression and augmentation in the venous structures of the left lower extremity. Venous Doppler signal is normal in all regions. There is no thrombus in the deep or visualized superficial venous structures on the left. There is no deep venous incompetence. There is soft tissue edema which is causing some diminished visualization of superficial venous structures in the thigh and calf regions.  IMPRESSION: No evidence of left lower extremity deep venous thrombosis. Moderate soft tissue edema.   Electronically Signed   By: Bretta Bang M.D.    On: 06/16/2013 14:30         Subjective: Patient is somnolent from the sedation from right heart catheterization. No reports of respiratory distress, vomiting, diarrhea, abdominal pain.  Objective: Filed Vitals:   06/24/13 0836 06/24/13 0837 06/24/13 0909 06/24/13 1040  BP:   146/73   Pulse:      Temp:      TempSrc:      Resp:      Height:      Weight:    76.658 kg (169 lb)  SpO2: 86% 95%      Intake/Output Summary (Last 24 hours) at 06/24/13 1142 Last data filed at 06/24/13 0221  Gross per 24 hour  Intake    360 ml  Output    650 ml  Net   -290 ml   Weight change: -3.493 kg (-7 lb 11.2 oz) Exam:   General:  Pt is alert, not in acute distress  HEENT: No icterus, No thrush,  [place]/AT  Cardiovascular: RRR, S1/S2, no rubs, no gallops  Respiratory: Bibasilar crackles. No wheezing.  Abdomen: Soft/+BS, non tender, non distended, no guarding  Extremities: 2+ edema, No lymphangitis, No petechiae, No rashes, no synovitis  Data Reviewed: Basic Metabolic Panel:  Recent Labs Lab 06/20/13 0400 06/21/13 0610 06/22/13 1350 06/23/13 0340 06/23/13 0600 06/24/13 0533  NA 139 141 139 139 140 137  K 5.0 4.1 3.9 3.7 3.9 3.5*  CL 106 105 101 99 100 96  CO2 18* 20 23 24 23 25   GLUCOSE 112* 96 168* 144* 125* 72  BUN 52* 57* 65* 69* 69* 79*  CREATININE 4.00* 4.03* 4.01* 3.90* 3.93* 3.97*  CALCIUM 8.3* 8.6 8.6 9.0 9.0 9.1  PHOS 7.1* 6.7*  6.3* 6.4*  --  6.6*   Liver Function Tests:  Recent Labs Lab 06/19/13 0612 06/20/13 0400 06/21/13 0610 06/22/13 1350 06/23/13 0340 06/24/13 0533  AST 14  --   --   --   --   --   ALT 15  --   --   --   --   --   ALKPHOS 87  --   --   --   --   --   BILITOT 0.3  --   --   --   --   --   PROT 7.1  --   --   --   --   --   ALBUMIN 2.8*  2.8* 2.6* 2.8* 2.8* 3.0* 2.8*   No results found for this basename: LIPASE, AMYLASE,  in the last 168 hours No results found for this basename: AMMONIA,  in the last 168 hours CBC:  Recent  Labs Lab 06/19/13 0612 06/23/13 0600  WBC 6.6 6.1  HGB 11.5* 11.5*  HCT 36.0 35.4*  MCV 85.5 85.3  PLT 136* 113*   Cardiac Enzymes: No results found for this basename: CKTOTAL, CKMB, CKMBINDEX, TROPONINI,  in the last 168 hours BNP: No components found with this basename: POCBNP,  CBG:  Recent Labs Lab 06/23/13 1645 06/23/13 2052 06/24/13 0734 06/24/13 0804 06/24/13 1029  GLUCAP 109* 192* 56* 96 112*    Recent Results (from the past 240 hour(s))  CULTURE, BLOOD (ROUTINE X 2)     Status: None   Collection Time    06/16/13  4:43 PM      Result Value Ref Range Status   Specimen Description BLOOD ARM LEFT   Final   Special Requests BOTTLES DRAWN AEROBIC AND ANAEROBIC 5CCBLUE 4CCRED   Final   Culture  Setup Time     Final   Value: 06/16/2013 22:10     [facility]formed at [facility]   Culture     Final   Value: NO GROWTH 5 DAYS     Performed at [facility]   Report Status 06/22/2013 FINAL   Final  CULTURE, BLOOD (ROUTINE X 2)     Status: None   Collection Time    06/16/13  4:53 PM      Result Value Ref Range Status   Specimen Description BLOOD ARM RIGHT   Final   Special Requests BOTTLES DRAWN AEROBIC AND ANAEROBIC 10CC   Final   Culture  Setup Time     Final   Value: 06/16/2013 22:10     Performed at [facility]   Culture     Final   Value: NO GROWTH 5 DAYS     Performed at [facility]   Report Status 06/22/2013 FINAL   Final  URINE CULTURE     Status: None   Collection Time    06/18/13  6:34 PM      Result Value Ref Range Status   Specimen Description URINE, RANDOM   Final   Special Requests NONE   Final   Culture  Setup Time     Final   Value: 06/18/2013 19:27     Performed at [facility]   Colony Count     Final   Value: >=100,000 COLONIES/ML     Performed at [facility]   Culture     Final   Value: ESCHERICHIA COLI     Performed at [facility]   Report Status 06/20/2013 FINAL  Final    Organism ID, Bacteria ESCHERICHIA COLI   Final     Scheduled Meds: . [START ON 06/25/2013] aspirin  81 mg Oral Pre-Cath  . [facility] HOLD] atorvastatin  80 mg Oral q1800  . [MAR HOLD] budesonide-formoterol  2 puff Inhalation BID  . [facility] HOLD] calcitRIOL  0.25 mcg Oral Daily  . [facility] HOLD] calcium acetate  1,334 mg Oral TID WC  . [facility] HOLD] cefUROXime  500 mg Oral Q24H  . [facility] HOLD] clopidogrel  75 mg Oral Q breakfast  . [MAR HOLD] doxycycline  100 mg Oral Q12H  . [facility] HOLD] DULoxetine  60 mg Oral Daily  . [facility] HOLD] furosemide  120 mg Intravenous Q8H  . [MAR HOLD] heparin  5,000 Units Subcutaneous 3 times per day  . [MAR HOLD] insulin aspart  0-5 Units Subcutaneous QHS  . [MAR HOLD] insulin aspart  0-9 Units Subcutaneous TID WC  . [MAR HOLD] insulin glargine  28 Units Subcutaneous QHS  . [MAR HOLD] ketoconazole   Topical BID  . [MAR HOLD] metolazone  5 mg Oral Daily  . [MAR HOLD] pantoprazole  40 mg Oral Daily  . [facility] HOLD] pregabalin  50 mg Oral BID  . [facility] HOLD] sodium chloride  3 mL Intravenous Q12H   Continuous Infusions:    Tao Satz, DO  [facility] Pager [phone]  If 7PM-7AM, please contact night-coverage [email or web address] Password [email or web address] 06/24/2013, 11:42 AM   LOS: 8 days

## 2013-06-24 NOTE — Progress Notes (Signed)
Patient ID: [name], female   DOB: [birth year], 68 y.o.   MRN: [id number] S:no new complaints O:BP 146/73  Pulse 103  Temp(Src) 97.8 F (36.6 C) (Oral)  Resp 19  Ht 5' (1.524 m)  Wt 76.658 kg (169 lb)  BMI 33.01 kg/m2  SpO2 95%  Intake/Output Summary (Last 24 hours) at 06/24/13 1317 Last data filed at 06/24/13 0221  Gross per 24 hour  Intake    240 ml  Output    650 ml  Net   -410 ml   Intake/Output: I/O last 3 completed shifts: In: 960 [P.O.:960] Out: 2300 [Urine:2300]  Intake/Output this shift:    Weight change: -3.493 kg (-7 lb 11.2 oz) Gen:WD, WN WF in NAD  CVS:no rub  Resp:decreased BS at bases  RDE:YCXKGY  Ext:tr edema    Recent Labs Lab 06/19/13 0612 06/20/13 0400 06/21/13 0610 06/22/13 1350 06/23/13 0340 06/23/13 0600 06/24/13 0533  NA 141  141 139 141 139 139 140 137  K 4.4  4.4 5.0 4.1 3.9 3.7 3.9 3.5*  CL 108  108 106 105 101 99 100 96  CO2 17*  17* 18* 20 23 24 23 25   GLUCOSE 125*  124* 112* 96 168* 144* 125* 72  BUN 50*  51* 52* 57* 65* 69* 69* 79*  CREATININE 4.01*  4.00* 4.00* 4.03* 4.01* 3.90* 3.93* 3.97*  ALBUMIN 2.8*  2.8* 2.6* 2.8* 2.8* 3.0*  --  2.8*  CALCIUM 8.2*  8.3* 8.3* 8.6 8.6 9.0 9.0 9.1  PHOS 6.8*  6.9* 7.1* 6.7* 6.3* 6.4*  --  6.6*  AST 14  --   --   --   --   --   --   ALT 15  --   --   --   --   --   --    Liver Function Tests:  Recent Labs Lab 06/19/13 0612  06/22/13 1350 06/23/13 0340 06/24/13 0533  AST 14  --   --   --   --   ALT 15  --   --   --   --   ALKPHOS 87  --   --   --   --   BILITOT 0.3  --   --   --   --   PROT 7.1  --   --   --   --   ALBUMIN 2.8*  2.8*  < > 2.8* 3.0* 2.8*  < > = values in this interval not displayed. No results found for this basename: LIPASE, AMYLASE,  in the last 168 hours No results found for this basename: AMMONIA,  in the last 168 hours CBC:  Recent Labs Lab 06/19/13 0612 06/23/13 0600  WBC 6.6 6.1  HGB 11.5* 11.5*  HCT 36.0 35.4*  MCV 85.5 85.3  PLT  136* 113*   Cardiac Enzymes: No results found for this basename: CKTOTAL, CKMB, CKMBINDEX, TROPONINI,  in the last 168 hours CBG:  Recent Labs Lab 06/23/13 1645 06/23/13 2052 06/24/13 0734 06/24/13 0804 06/24/13 1029  GLUCAP 109* 192* 56* 96 112*    Iron Studies: No results found for this basename: IRON, TIBC, TRANSFERRIN, FERRITIN,  in the last 72 hours Studies/Results: No results found. Marland Kitchen atorvastatin  80 mg Oral q1800  . budesonide-formoterol  2 puff Inhalation BID  . calcitRIOL  0.25 mcg Oral Daily  . calcium acetate  1,334 mg Oral TID WC  . cefUROXime  500 mg Oral Q24H  . clopidogrel  75 mg  Oral Q breakfast  . doxycycline  100 mg Oral Q12H  . DULoxetine  60 mg Oral Daily  . heparin  5,000 Units Subcutaneous 3 times per day  . insulin aspart  0-5 Units Subcutaneous QHS  . insulin aspart  0-9 Units Subcutaneous TID WC  . insulin glargine  28 Units Subcutaneous QHS  . ketoconazole   Topical BID  . pantoprazole  40 mg Oral Daily  . pregabalin  50 mg Oral BID  . sodium chloride  3 mL Intravenous Q12H    BMET    Component Value Date/Time   NA 137 06/24/2013 0533   K 3.5* 06/24/2013 0533   CL 96 06/24/2013 0533   CO2 25 06/24/2013 0533   GLUCOSE 72 06/24/2013 0533   BUN 79* 06/24/2013 0533   CREATININE 3.97* 06/24/2013 0533   CALCIUM 9.1 06/24/2013 0533   GFRNONAA 11* 06/24/2013 0533   GFRAA 12* 06/24/2013 0533   CBC    Component Value Date/Time   WBC 6.1 06/23/2013 0600   WBC 9.2 12/19/2012 1601   RBC 4.15 06/23/2013 0600   RBC 4.66 12/19/2012 1601   HGB 11.5* 06/23/2013 0600   HGB 13.7 12/19/2012 1601   HCT 35.4* 06/23/2013 0600   HCT 42.9 12/19/2012 1601   PLT 113* 06/23/2013 0600   MCV 85.3 06/23/2013 0600   MCV 92.0 12/19/2012 1601   MCH 27.7 06/23/2013 0600   MCH 29.4 12/19/2012 1601   MCHC 32.5 06/23/2013 0600   MCHC 31.9 12/19/2012 1601   RDW 16.3* 06/23/2013 0600   LYMPHSABS 1.6 06/16/2013 1915   MONOABS 0.4 06/16/2013 1915   EOSABS 0.2 06/16/2013 1915   BASOSABS  0.0 06/16/2013 1915     Assessment/Plan:  1. CHF- acute on chronic systolic now s/p RHC c/w restrictive CMP.  Agree with plan for milrinone to max cardiac output and hopefully improve cardiorenal syndrome. 2. 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  1. Appreciate HF team's input. Diuresing and Scr stable but above baseline.  2. Pt wants to hold off on HD for now. No urgent indication for HD and will cont to follow after diuresis and possible inotropes depending upon RHC results.  3. Will need vein mapping and placement of AVG/AVF regardless of her needing HD this admission.  4. Will consult VVS for access 2. Pulm HTN - will need further work up 3. ACDz- stable 4. SHPTH: on vit D and binders 5. UTI- on ceftin and doxy (cellulitis resolved) 6. HTN- stable 7. DM- per primary svc 8. Vascular access- will need vein mapping and longterm access and possible PC during this hospitalization depending upon her renal function 9.   Max Nuno A

## 2013-06-24 NOTE — CV Procedure (Signed)
Cardiac Cath Procedure Note:  Indication:   Procedures performed: Heart failure and renal failure\  1) Right heart catheterization 2) Insertion of RIJ triple lumen catheter  Description of procedure:   The risks and indication of the procedure were explained. Consent was signed and placed on the chart. An appropriate timeout was taken prior to the procedure. The right neck was prepped and draped in the routine sterile fashion and anesthetized with 1% local lidocaine.   A 7 FR venous sheath was placed in the right internal jugular vein using a modified Seldinger technique. A standard Swan-Ganz catheter was used for the procedure.   After the right heart cath was complete the 7FR venous sheath was changed over a wire and a 7FR triple lumen central venous catheter was placed with fluoro guidance.   Complications: None apparent.  Findings:  RA = 16 RV = 65/7/17 PA = 70/34 (47) PCW = 17 Fick cardiac output/index = 3.4/1.9 Thermo CO/CI = 2.9/1.7 PVR = 10 WU FA sat = 95% PA sat = 50%, 51%  Assessment: 1. Markedly elevated R-sided pressures with normal left-sided pressures 2. Equalization of RV and LV filling pressures suggestive of restrictive/constrictive physiology 3. 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 Low cardiac output  Plan/Discussion:  I suspect she has restrictive physiology. Given low cardiac output will start trial of milrinone and see how her renal function responds. Continue lasix. Pulmonary HTN may be secondary to longstanding elevation in left-sided pressures but given magnitude of PVR will need work-up for PAH including PFTs and VQ scan. Consider trial of PDE-5 inhibitors as tolerated.   Arvilla Meres MD 11:35 AM

## 2013-06-24 NOTE — Progress Notes (Signed)
Inpatient Diabetes Program Recommendations  AACE/ADA: New Consensus Statement on Inpatient Glycemic Control (2013)  Target Ranges:  Prepandial:   less than 140 mg/dL      Peak postprandial:   less than 180 mg/dL (1-2 hours)      Critically ill patients:  140 - 180 mg/dL     Results for ALEAYA, TRACHTMAN I (MRN [id number]) as of 06/24/2013 09:30  Ref. Range 06/24/2013 07:34 06/24/2013 08:04  Glucose-Capillary Latest Range: 70-99 mg/dL 56 (L) 96    Diabetes history: Type 2 DM Outpatient Diabetes medications: Lantus 28 units QHS + Humalog 6-11 units tid per SSI Current orders for Inpatient glycemic control: Home Lantus + Novolog Sensitive SSI   **Note patient had hypoglycemia this morning after receiving Lantus 28 units the night before   **MD- Please consider decreasing Lantus to 25 units QHS if patient continues to have hypoglycemia in the AM    Will follow. Ambrose Finland RN, MSN, CDE Diabetes Coordinator Inpatient Diabetes Program Team Pager: [phone] (8a-10p)

## 2013-06-24 NOTE — Progress Notes (Signed)
Report given to [place], [place] 9M. All questions answered.

## 2013-06-25 ENCOUNTER — Inpatient Hospital Stay (HOSPITAL_COMMUNITY): Payer: Medicare Other

## 2013-06-25 ENCOUNTER — Other Ambulatory Visit (HOSPITAL_COMMUNITY): Payer: Self-pay | Admitting: Anesthesiology

## 2013-06-25 DIAGNOSIS — R0602 Shortness of breath: Secondary | ICD-10-CM

## 2013-06-25 LAB — CARBOXYHEMOGLOBIN
Carboxyhemoglobin: 1.6 % — ABNORMAL HIGH (ref 0.5–1.5)
Methemoglobin: 0.6 % (ref 0.0–1.5)
O2 Saturation: 69 %
Total hemoglobin: 10.8 g/dL — ABNORMAL LOW (ref 12.0–16.0)

## 2013-06-25 LAB — GLUCOSE, CAPILLARY
GLUCOSE-CAPILLARY: 207 mg/dL — AB (ref 70–99)
GLUCOSE-CAPILLARY: 205 mg/dL — AB (ref 70–99)
Glucose-Capillary: 104 mg/dL — ABNORMAL HIGH (ref 70–99)
Glucose-Capillary: 87 mg/dL (ref 70–99)

## 2013-06-25 LAB — RENAL FUNCTION PANEL
Albumin: 2.7 g/dL — ABNORMAL LOW (ref 3.5–5.2)
BUN: 80 mg/dL — ABNORMAL HIGH (ref 6–23)
CALCIUM: 8.7 mg/dL (ref 8.4–10.5)
CO2: 29 meq/L (ref 19–32)
Chloride: 92 mEq/L — ABNORMAL LOW (ref 96–112)
Creatinine, Ser: 3.8 mg/dL — ABNORMAL HIGH (ref 0.50–1.10)
GFR calc non Af Amer: 11 mL/min — ABNORMAL LOW (ref >90)
GFR, EST AFRICAN AMERICAN: 13 mL/min — AB (ref >90)
Glucose, Bld: 99 mg/dL (ref 70–99)
PHOSPHORUS: 7.1 mg/dL — AB (ref 2.3–4.6)
POTASSIUM: 3.5 meq/L — AB (ref 3.7–5.3)
SODIUM: 138 meq/L (ref 137–147)

## 2013-06-25 LAB — PULMONARY FUNCTION TEST
DL/VA % pred: 68 %
DL/VA: 2.89 ml/min/mmHg/L
DLCO COR % PRED: 32 %
DLCO COR: 6.17 ml/min/mmHg
DLCO unc % pred: 29 %
DLCO unc: 5.61 ml/min/mmHg
FEF 25-75 Pre: 0.5 L/sec
FEF2575-%Pred-Pre: 27 %
FEV1-%PRED-PRE: 52 %
FEV1-Pre: 1.04 L
FEV1FVC-%PRED-PRE: 89 %
FEV6-%Pred-Pre: 60 %
FEV6-Pre: 1.52 L
FEV6FVC-%Pred-Pre: 104 %
FVC-%PRED-PRE: 58 %
FVC-Pre: 1.52 L
Pre FEV1/FVC ratio: 68 %
Pre FEV6/FVC Ratio: 100 %
RV % pred: 68 %
RV: 1.34 L
TLC % pred: 67 %
TLC: 3.01 L

## 2013-06-25 LAB — PROTIME-INR
INR: 1.11 (ref 0.00–1.49)
Prothrombin Time: 14.1 seconds (ref 11.6–15.2)

## 2013-06-25 MED ORDER — TECHNETIUM TC 99M DIETHYLENETRIAME-PENTAACETIC ACID: 40.0000 | Freq: Once | INTRAVENOUS | Status: AC | PRN

## 2013-06-25 MED ORDER — SILDENAFIL CITRATE 20 MG PO TABS
20.0000 mg | ORAL_TABLET | Freq: Three times a day (TID) | ORAL | Status: DC
  Administered 2013-06-25 – 2013-07-02 (×20): 20 mg via ORAL
  Filled 2013-06-25 (×24): qty 1

## 2013-06-25 MED ORDER — TECHNETIUM TO 99M ALBUMIN AGGREGATED: 6.0000 | Freq: Once | INTRAVENOUS | Status: AC | PRN

## 2013-06-25 NOTE — Progress Notes (Signed)
Right  Upper Extremity Vein Map    Cephalic  Segment Diameter Depth Comment  1. Axilla 4.73mm    2. Mid upper arm 5.16mm    3. Above AC 5.82mm    4. In [facility] 6.42mm    5. Below AC 4.84mm  Multiple branches  6. Mid forearm 2.53mm  Branch  7. Wrist   Unable to visualize due to IV placement.                  Left Upper Extremity Vein Map    Cephalic  Segment Diameter Depth Comment  1. Axilla 4.5mm    2. Mid upper arm 4.23mm    3. Above [facility] 4.51mm    4. In [facility] 5.74mm    5. Below AC 4.18mm    6. Mid forearm 5.65mm    7. Wrist 2.28mm                     06/24/2013 Gertie Fey, RVT, RDCS, RDMS

## 2013-06-25 NOTE — Progress Notes (Signed)
CARDIAC REHAB PHASE I   PRE:  Rate/Rhythm: 106 ST with PVC's  BP:  Supine:   Sitting: 90/60  Standing:    SaO2: 97 2L  MODE:  Ambulation: 225 ft   POST:  Rate/Rhythm: 115  BP:  Supine:   Sitting: 106/60  Standing:    SaO2: 94 2L 1319-1400 Assisted X 2 used walker and O2 2L to ambulate. Gait fairly steady with walker Pt leans forward with walking and has a very slow pace. Pt able to walk 225 feet, only c/o was of weakness. VS stable Pt to recliner after walk with call light in reach.  Melina Copa RN 06/25/2013 1:58 PM

## 2013-06-25 NOTE — Progress Notes (Signed)
  Heart failure f/u:  Patient started on milrinone today for advanced HF with pulmonary HTN and markedly elevated PVR.   Will need VQ as well as SPEP/UPEP, sleep study and PFTs. Will also start Revatio 20 tid.   Will need repeat RHC to assess response to milrinone. Await to see what renal function does.   The HF team will manage her HF and PAH. Appreciate P/CCM input.   Calvin Jablonowski,MD 12:26 AM

## 2013-06-25 NOTE — Progress Notes (Signed)
Patient ID: [name], female   DOB: 04/25/1946, 68 y.o.   MRN: [id number] S:no new complaints O:BP 133/55  Pulse 95  Temp(Src) 97.5 F (36.4 C) (Oral)  Resp 16  Ht 5' (1.524 m)  Wt 74.526 kg (164 lb 4.8 oz)  BMI 32.09 kg/m2  SpO2 90%  Intake/Output Summary (Last 24 hours) at 06/25/13 1134 Last data filed at 06/25/13 0939  Gross per 24 hour  Intake      0 ml  Output   1750 ml  Net  -1750 ml   Intake/Output: I/O last 3 completed shifts: In: 0  Out: 2050 [Urine:2050]  Intake/Output this shift:  Total I/O In: -  Out: 350 [Urine:350] Weight change: -1.043 kg (-2 lb 4.8 oz) Gen:WD WN WF in NAD CVS:no rub Resp:cta [name]:[id number] Ext:+edema   Recent Labs Lab 06/19/13 0612 06/20/13 0400 06/21/13 0610 06/22/13 1350 06/23/13 0340 06/23/13 0600 06/24/13 0533 06/24/13 1455 06/25/13 0510  NA 141  141 139 141 139 139 140 137  --  138  K 4.4  4.4 5.0 4.1 3.9 3.7 3.9 3.5*  --  3.5*  CL 108  108 106 105 101 99 100 96  --  92*  CO2 17*  17* 18* 20 23 24 23 25   --  29  GLUCOSE 125*  124* 112* 96 168* 144* 125* 72  --  99  BUN 50*  51* 52* 57* 65* 69* 69* 79*  --  80*  CREATININE 4.01*  4.00* 4.00* 4.03* 4.01* 3.90* 3.93* 3.97* 3.86* 3.80*  ALBUMIN 2.8*  2.8* 2.6* 2.8* 2.8* 3.0*  --  2.8*  --  2.7*  CALCIUM 8.2*  8.3* 8.3* 8.6 8.6 9.0 9.0 9.1  --  8.7  PHOS 6.8*  6.9* 7.1* 6.7* 6.3* 6.4*  --  6.6*  --  7.1*  AST 14  --   --   --   --   --   --   --   --   ALT 15  --   --   --   --   --   --   --   --    Liver Function Tests:  Recent Labs Lab 06/19/13 0612  06/23/13 0340 06/24/13 0533 06/25/13 0510  AST 14  --   --   --   --   ALT 15  --   --   --   --   ALKPHOS 87  --   --   --   --   BILITOT 0.3  --   --   --   --   PROT 7.1  --   --   --   --   ALBUMIN 2.8*  2.8*  < > 3.0* 2.8* 2.7*  < > = values in this interval not displayed. No results found for this basename: LIPASE, AMYLASE,  in the last 168 hours No results found for this basename: AMMONIA,   in the last 168 hours CBC:  Recent Labs Lab 06/19/13 0612 06/23/13 0600 06/24/13 1455  WBC 6.6 6.1 6.2  HGB 11.5* 11.5* 10.8*  HCT 36.0 35.4* 33.1*  MCV 85.5 85.3 83.6  PLT 136* 113* 117*   Cardiac Enzymes: No results found for this basename: CKTOTAL, CKMB, CKMBINDEX, TROPONINI,  in the last 168 hours CBG:  Recent Labs Lab 06/24/13 1029 06/24/13 1651 06/24/13 1735 06/24/13 2121 06/25/13 0632  GLUCAP 112* 45* 99 102* 104*    Iron Studies: No results found for this basename: IRON,  TIBC, TRANSFERRIN, FERRITIN,  in the last 72 hours Studies/Results: Dg Chest Port 1 View  06/24/2013   CLINICAL DATA:  Central line placement  EXAM: PORTABLE CHEST - 1 VIEW  COMPARISON:  DG CHEST 2 VIEW dated 06/18/2013  FINDINGS: There is interval placement of a right central venous line with tip in distal SVC. No pneumothorax. There is bilateral mild airspace disease in the lung bases. There is small effusions.  IMPRESSION: 1. Right central venous line in good position without complication. 2. Increase in bibasilar airspace disease suggests pulmonary edema.   Electronically Signed   By: [name] M.D.   On: 06/24/2013 18:59   . atorvastatin  80 mg Oral q1800  . budesonide-formoterol  2 puff Inhalation BID  . calcitRIOL  0.25 mcg Oral Daily  . calcium acetate  1,334 mg Oral TID WC  . cefUROXime  500 mg Oral Q24H  . clopidogrel  75 mg Oral Q breakfast  . doxycycline  100 mg Oral Q12H  . DULoxetine  60 mg Oral Daily  . furosemide  80 mg Intravenous BID  . heparin  5,000 Units Subcutaneous 3 times per day  . insulin aspart  0-9 Units Subcutaneous TID WC  . ketoconazole   Topical BID  . pantoprazole  40 mg Oral Daily  . pregabalin  50 mg Oral BID  . sildenafil  20 mg Oral TID  . sodium chloride  3 mL Intravenous Q12H    BMET    Component Value Date/Time   NA 138 06/25/2013 0510   K 3.5* 06/25/2013 0510   CL 92* 06/25/2013 0510   CO2 29 06/25/2013 0510   GLUCOSE 99 06/25/2013 0510   BUN  80* 06/25/2013 0510   CREATININE 3.80* 06/25/2013 0510   CALCIUM 8.7 06/25/2013 0510   GFRNONAA 11* 06/25/2013 0510   GFRAA 13* 06/25/2013 0510   CBC    Component Value Date/Time   WBC 6.2 06/24/2013 1455   WBC 9.2 12/19/2012 1601   RBC 3.96 06/24/2013 1455   RBC 4.66 12/19/2012 1601   HGB 10.8* 06/24/2013 1455   HGB 13.7 12/19/2012 1601   HCT 33.1* 06/24/2013 1455   HCT 42.9 12/19/2012 1601   PLT 117* 06/24/2013 1455   MCV 83.6 06/24/2013 1455   MCV 92.0 12/19/2012 1601   MCH 27.3 06/24/2013 1455   MCH 29.4 12/19/2012 1601   MCHC 32.6 06/24/2013 1455   MCHC 31.9 12/19/2012 1601   RDW 15.8* 06/24/2013 1455   LYMPHSABS 1.6 06/16/2013 1915   MONOABS 0.4 06/16/2013 1915   EOSABS 0.2 06/16/2013 1915   BASOSABS 0.0 06/16/2013 1915     Assessment/Plan:  1. CHF- acute on chronic systolic now s/p RHC c/w restrictive CMP. Agree with plan for milrinone to max cardiac output and hopefully improve cardiorenal syndrome. 1. Diuresed almost 2L over the last 24 hours with improved Scr 2. Cont with current plan per HF team 2. 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  1. Appreciate HF team's input. Diuresing and Scr stable but above baseline.  2. Pt wants to hold off on HD for now. No urgent indication for HD and will cont to follow after diuresis and possible inotropes depending upon RHC results.  3. Will need vein mapping and placement of AVG/AVF regardless of her needing HD this admission.  4. Will consult [facility] for access 2. Pulm HTN - will need further work up 3. Hypokalemia- replete and follow 4. ACDz- stable 5. SHPTH:  on vit D and binders 6. UTI- on ceftin and doxy (cellulitis resolved) 7. HTN- stable 8. DM- per primary svc 9. Vascular access- will need vein mapping and longterm access and possible PC during this hospitalization depending upon her renal function 10.   [name] A

## 2013-06-25 NOTE — Progress Notes (Signed)
[facility] PROGRESS NOTE  [name] ZOX:[id number] DOB: [birth year] DOA: 06/16/2013 PCP: [name], [name], MD  Assessment/Plan: Cellulitis:  - Improving. Continue doxycycline 2 additional days to complete 10 days of therapy  UTI  - E coli resistant to FQ- outpatient study. Sensitive to rocephin.   -Changed cipro to ceftin.  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 Sees Dr. [name]  -Appreciate nephrology followup  -May need permacath  -cards has patient on IV lasix for further diuresis  -access and venous mapping per nephrology Acute on Chronic systolic and diastolic congestive heart failure  -EF 15-20%  -Continue furosemide IV per cardiology -Appreciate Dr. Gala RomneyBensimhon followup  -right heart catheterization--markedly elevated right heart pressures with moderate pulmonary hypertension  -V/Q scan--very low probability -Await UPEP and PFTs PAH -Likely a combination of COPD and left heart failure -Workup in progress per cardiology -Await UPEP and PFTs -need outpt polysomnography -sildenafil started 06/24/13 PVD (eripheral vascular disease) with claudication:  -ABI shows right WNL and left with moderate disease- can follow up outpatient with vascular  Tinea pedis:  Antifungals topical  Tobacco abuse  -encourage cessation  Venous stasis dermatitis  Hyperlipidemia  -Continue Lipitor  DM2  -HgbA1C- 7.9  -Patient had episodes of hypoglycemia due to poor po intake -d/c lantus and observe Family Communication: updated Granddaughter yesterday Disposition Plan: Home when medically stable        Procedures/Studies: Dg Chest 2 View  06/18/2013   CLINICAL DATA:  Shortness of breath, history of CHF  EXAM: CHEST  2 VIEW  COMPARISON:  DG CHEST 1V PORT dated 02/11/2013  FINDINGS: Low lung  volumes. Cardiac silhouette is enlarged. Patient is status post median sternotomy coronary artery bypass grafting. There is diffuse prominence of the interstitial markings and mild peribronchial cuffing. Areas of increased density projects within the lung bases. No focal regions of consolidation appreciated. There is blunting of the costophrenic angles. The osseous structures unremarkable.  IMPRESSION: Interstitial findings likely representing pulmonary edema. A component of chronic bronchitic changes also a diagnostic consideration. Bibasilar infiltrate versus atelectasis. Surveillance evaluation recommended.   Electronically Signed   By: Salome HolmesHector  Cooper M.D.   On: 06/18/2013 18:16   Dg Tibia/fibula Left  06/16/2013   CLINICAL DATA:  68 year old female with lower extremity soft tissue injury, abrasions/burning, infected. Initial encounter.  EXAM: LEFT TIBIA AND FIBULA - 2 VIEW  COMPARISON:  Left lower extremity venous ultrasound from the same day reported separately.  FINDINGS: Small medial surgical clips near the level of the knee joint. Evidence of widespread subcutaneous stranding and increased density at the level of the tib-fib. Questionable increased striated pattern of muscles in the calf. No subcutaneous gas. Calcified atherosclerosis. No radiopaque foreign body identified.  No acute fracture or dislocation identified in the tibia or fibula. Bulky chronic degenerative spurring/heterotopic ossification at the tibial tuberosity. No cortical osteolysis identified.  IMPRESSION: 1. Increased soft tissue density compatible with cellulitis in this clinical setting. No subcutaneous gas or retained foreign body identified. 2. No acute osseous abnormality identified. Advanced chronic posttraumatic / degenerative changes at the tibial tuberosity.   Electronically Signed   By: Augusto GambleLee  Hall M.D.   On: 06/16/2013 15:12   [name]  06/18/2013   CLINICAL DATA:  An elevated creatinine. History of chronic renal disease   EXAM: RENAL/URINARY TRACT ULTRASOUND COMPLETE  COMPARISON:  DG ABD PORTABLE 1V dated 02/12/2013; US RENAL dated 02/09/2013  FINDINGS: Right Kidney:  Length: 9.1 cm. There is decreased corticomedullary differentiation. A small benign-appearing 0.7 x 0.4 x 0.7 cm cyst is identified within the midpole periphery the right kidney. There is no evidence of solid-appearing masses, hydronephrosis nor calculi.  Left Kidney:  Length: 11.9 cm. There is decreased corticomedullary differentiation. There is no evidence of hydronephrosis, solid or cystic masses, nor calculi.  Bladder:  Urinary bladder is decompressed.  IMPRESSION: Increased cortical echogenicity bilaterally. Cysts consistent with patient's history of chronic renal disease. There is no evidence of obstructive uropathy no calculi. Small benign-appearing cyst is appreciated within the midpole region right kidney.   Electronically Signed   By: [name] M.D.   On: 06/18/2013 18:29   Nm Pulmonary Perf And Vent  06/25/2013   CLINICAL DATA:  Pulmonary hypertension  EXAM: NUCLEAR MEDICINE VENTILATION - PERFUSION LUNG SCAN  TECHNIQUE: Ventilation images were obtained in multiple projections using inhaled aerosol technetium 99 M DTPA. Perfusion images were obtained in multiple projections after intravenous injection of Tc-96m MAA.  COMPARISON:  Chest radiograph 06/24/2013  RADIOPHARMACEUTICALS:  40.0 mCi Tc-82m DTPA aerosol and 6.0 mCi Tc-34m MAA  FINDINGS: Ventilation: Mild peripheral irregularity of ventilation.  Enlargement of cardiac silhouette with diminished ventilation in lingula and left lower lobe.  Mild central airway deposition of tracer.  Perfusion: Enlargement of cardiac silhouette.  Mild diminished perfusion to left lower lobe and lingula, less severe than ventilatory abnormality.  Single small subsegmental perfusion defect in left lower lobe.  No other perfusion defects identified.  Chest radiograph significant for enlargement of cardiac silhouette post  CABG and bibasilar airspace disease.  IMPRESSION: Very low probability for pulmonary embolism.   Electronically Signed   By: Ulyses Southward M.D.   On: 06/25/2013 11:45   US Venous Img Lower Unilateral Left  06/16/2013   CLINICAL DATA:  Soft tissue edema  EXAM: LEFT LOWER EXTREMITY VENOUS DUPLEX ULTRASOUND  TECHNIQUE: Gray-scale sonography with graded compression, as well as color Doppler and duplex ultrasound, were performed to evaluate the deep venous system from the level of the common femoral vein through the popliteal and proximal calf veins. Spectral Doppler was utilized to evaluate flow at rest and with distal augmentation maneuvers.  COMPARISON:  None.  FINDINGS: Flow in the venous structures of the left lower extremity is spontaneous and phasic in all segments. There is normal compression and augmentation in the venous structures of the left lower extremity. Venous Doppler signal is normal in all regions. There is no thrombus in the deep or visualized superficial venous structures on the left. There is no deep venous incompetence. There is soft tissue edema which is causing some diminished visualization of superficial venous structures in the thigh and calf regions.  IMPRESSION: No evidence of left lower extremity deep venous thrombosis. Moderate soft tissue edema.   Electronically Signed   By: Bretta Bang M.D.   On: 06/16/2013 14:30   Dg Chest Port 1 View  06/24/2013   CLINICAL DATA:  Central line placement  EXAM: PORTABLE CHEST - 1 VIEW  COMPARISON:  DG CHEST 2 VIEW dated 06/18/2013  FINDINGS: There is interval placement of a right central venous line with tip in distal SVC. No pneumothorax. There is bilateral mild airspace disease in the lung bases. There is small effusions.  IMPRESSION: 1. Right central venous line in good position without complication. 2. Increase in bibasilar airspace disease suggests pulmonary edema.  Electronically Signed   By: Genevive Bi M.D.   On: 06/24/2013 18:59          Subjective: Patient is feeling better today. Denies fevers, chills, chest discomfort, shortness breath, nausea, vomiting, diarrhea, abdominal pain, dysuria.  Objective: Filed Vitals:   06/25/13 0500 06/25/13 0549 06/25/13 0826 06/25/13 1400  BP:  133/55  114/54  Pulse:  95  97  Temp:  97.5 F (36.4 C)  97 F (36.1 C)  TempSrc:  Oral  Oral  Resp:  16  18  Height:      Weight: 74.526 kg (164 lb 4.8 oz)     SpO2:  92% 90% 97%    Intake/Output Summary (Last 24 hours) at 06/25/13 1754 Last data filed at 06/25/13 1300  Gross per 24 hour  Intake    240 ml  Output   1750 ml  Net  -1510 ml   Weight change: -1.043 kg (-2 lb 4.8 oz) Exam:   General:  Pt is alert, follows commands appropriately, not in acute distress  HEENT: No icterus, No thrush,  [place]/AT  Cardiovascular: RRR, S1/S2, no rubs, no gallops  Respiratory: Bibasilar crackles. No wheezing. Good air movement.  Abdomen: Soft/+BS, non tender, non distended, no guarding  Extremities: 2+LE edema, No lymphangitis, No petechiae, No rashes, no synovitis  Data Reviewed: Basic Metabolic Panel:  Recent Labs Lab 06/21/13 0610 06/22/13 1350 06/23/13 0340 06/23/13 0600 06/24/13 0533 06/24/13 1455 06/25/13 0510  NA 141 139 139 140 137  --  138  K 4.1 3.9 3.7 3.9 3.5*  --  3.5*  CL 105 101 99 100 96  --  92*  CO2 20 23 24 23 25   --  29  GLUCOSE 96 168* 144* 125* 72  --  99  BUN 57* 65* 69* 69* 79*  --  80*  CREATININE 4.03* 4.01* 3.90* 3.93* 3.97* 3.86* 3.80*  CALCIUM 8.6 8.6 9.0 9.0 9.1  --  8.7  PHOS 6.7* 6.3* 6.4*  --  6.6*  --  7.1*   Liver Function Tests:  Recent Labs Lab 06/19/13 0612  06/21/13 0610 06/22/13 1350 06/23/13 0340 06/24/13 0533 06/25/13 0510  AST 14  --   --   --   --   --   --   ALT 15  --   --   --   --   --   --   ALKPHOS 87  --   --   --   --   --   --   BILITOT 0.3  --   --   --   --   --   --   PROT 7.1  --   --   --   --   --   --   ALBUMIN 2.8*  2.8*  < > 2.8* 2.8*  3.0* 2.8* 2.7*  < > = values in this interval not displayed. No results found for this basename: LIPASE, AMYLASE,  in the last 168 hours No results found for this basename: AMMONIA,  in the last 168 hours CBC:  Recent Labs Lab 06/19/13 0612 06/23/13 0600 06/24/13 1455  WBC 6.6 6.1 6.2  HGB 11.5* 11.5* 10.8*  HCT 36.0 35.4* 33.1*  MCV 85.5 85.3 83.6  PLT 136* 113* 117*   Cardiac Enzymes: No results found for this basename: CKTOTAL, CKMB, CKMBINDEX, TROPONINI,  in the last 168 hours BNP: No components found with this basename: POCBNP,  CBG:  Recent Labs Lab 06/24/13 1735 06/24/13  2121 06/25/13 9562 06/25/13 1147 06/25/13 1644  GLUCAP 99 102* 104* 87 207*    Recent Results (from the past 240 hour(s))  CULTURE, BLOOD (ROUTINE X 2)     Status: None   Collection Time    06/16/13  4:43 PM      Result Value Ref Range Status   Specimen Description BLOOD ARM LEFT   Final   Special Requests BOTTLES DRAWN AEROBIC AND ANAEROBIC 5CCBLUE 4CCRED   Final   Culture  Setup Time     Final   Value: 06/16/2013 22:10     Performed at [facility]   Culture     Final   Value: NO GROWTH 5 DAYS     Performed at [facility]   Report Status 06/22/2013 FINAL   Final  CULTURE, BLOOD (ROUTINE X 2)     Status: None   Collection Time    06/16/13  4:53 PM      Result Value Ref Range Status   Specimen Description BLOOD ARM RIGHT   Final   Special Requests BOTTLES DRAWN AEROBIC AND ANAEROBIC 10CC   Final   Culture  Setup Time     Final   Value: 06/16/2013 22:10     Performed at [facility]   Culture     Final   Value: NO GROWTH 5 DAYS     Performed at [facility]   Report Status 06/22/2013 FINAL   Final  URINE CULTURE     Status: None   Collection Time    06/18/13  6:34 PM      Result Value Ref Range Status   Specimen Description URINE, RANDOM   Final   Special Requests NONE   Final   Culture  Setup Time     Final   Value: 06/18/2013 19:27      Performed at [facility] Count     Final   Value: >=100,000 COLONIES/ML     Performed at [facility]   Culture     Final   Value: ESCHERICHIA COLI     Performed at [facility]   Report Status 06/20/2013 FINAL   Final   Organism ID, Bacteria ESCHERICHIA COLI   Final     Scheduled Meds: . atorvastatin  80 mg Oral q1800  . budesonide-formoterol  2 puff Inhalation BID  . calcitRIOL  0.25 mcg Oral Daily  . calcium acetate  1,334 mg Oral TID WC  . cefUROXime  500 mg Oral Q24H  . clopidogrel  75 mg Oral Q breakfast  . doxycycline  100 mg Oral Q12H  . DULoxetine  60 mg Oral Daily  . furosemide  80 mg Intravenous BID  . heparin  5,000 Units Subcutaneous 3 times per day  . insulin aspart  0-9 Units Subcutaneous TID WC  . ketoconazole   Topical BID  . pantoprazole  40 mg Oral Daily  . pregabalin  50 mg Oral BID  . sildenafil  20 mg Oral TID  . sodium chloride  3 mL Intravenous Q12H   Continuous Infusions: . milrinone 0.25 mcg/kg/min (06/25/13 0759)     Zenya Hickam, DO  [facility] Pager [phone]  If 7PM-7AM, please contact night-coverage [email or web address] Password [email or web address] 06/25/2013, 5:54 PM   LOS: 9 days

## 2013-06-25 NOTE — Progress Notes (Signed)
Advanced Heart Failure Team Consult Note  Referring Physician: Dr. Arrie Aran Primary Physician: Dr. Hale Bogus Primary Cardiologist:  [name]  Reason for Consultation: Heart Failure   HPI:    [name] is a 68 yo female with a history of CAD s/p CABG (2007), ICM, chronic systolic HF EF ~25%, HTN, DM2, PVD s/p R fem-pop (12/2012) complicated by necrotic anterior abdominal wall fascia and lateral soft tissues and CKD stage III-IV. Last myoview was 10/2011 which was negative for ischemia. 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 Over the past 6 months reports a marked decrease in functional capacity. She has had 3 readmissions in the past 6 months. Complaints of increased weight gain ~ 30 lbs over past 3-4 months, SOB, CP and edema. Her kidney function has continued to decline as well. Has had several episodes of exertional chest pressure recently.   Baseline CR 5/14 Cr 1.4 9/14 Cr 1.4 10/14 Episode acute renal failure 4.5->2.1 2/15  Admit Cr 3.77-> 4.01-> 3.9  RHC yesterday:  RA = 16  RV = 65/7/17  PA = 70/34 (47)  PCW = 17  Fick cardiac output/index = 3.4/1.9  Thermo CO/CI = 2.9/1.7  PVR = 10 WU  FA sat = 95%  PA sat = 50%, 51%  Was started on IV milrinone and revatio 20 mg TID yesterday after cath. Still on IV lasix, 24 hr I/O -1.4 liters. Weight down 5 lbs. Cr 3.8. Denies SOB, orthopnea or CP. + fatigued  Objective:    Vital Signs:   Temp:  [97.5 F (36.4 C)-97.8 F (36.6 C)] 97.5 F (36.4 C) (02/19 0549) Pulse Rate:  [86-103] 95 (02/19 0549) Resp:  [16-20] 16 (02/19 0549) BP: (132-147)/(55-78) 133/55 mmHg (02/19 0549) SpO2:  [86 %-98 %] 92 % (02/19 0549) Weight:  [164 lb 4.8 oz (74.526 kg)-169 lb (76.658 kg)] 164 lb 4.8 oz (74.526 kg) (02/19 0500) Last BM Date: 06/20/13  Weight change: Filed Weights   06/23/13 2043 06/24/13 1040 06/25/13 0500  Weight: 171 lb  4.8 oz (77.701 kg) 169 lb (76.658 kg) 164 lb 4.8 oz (74.526 kg)    Intake/Output:   Intake/Output Summary (Last 24 hours) at 06/25/13 0753 Last data filed at 06/25/13 0012  Gross per 24 hour  Intake      0 ml  Output   1400 ml  Net  -1400 ml     Physical Exam: General:  Elderly. Lying in bed No resp difficulty HEENT: normal Neck: supple. JVP 10. Carotids 2+ bilat; no bruits. No lymphadenopathy or thryomegaly appreciated. Cor: PMI nondisplaced. Regular rate & rhythm. No rubs, gallops or murmurs. Lungs: clear Abdomen: soft, nontender, + distended. No hepatosplenomegaly. No bruits or masses. Good bowel sounds. Extremities: no cyanosis, clubbing, rash, 2-3 + bilateral edema Neuro: alert & orientedx3, cranial nerves grossly intact. moves all 4 extremities w/o difficulty. Affect pleasant   Labs: Basic Metabolic Panel:  Recent Labs Lab 06/21/13 0610 06/22/13 1350 06/23/13 0340 06/23/13 0600 06/24/13 0533 06/24/13 1455 06/25/13 0510  NA 141 139 139 140 137  --  138  K 4.1 3.9 3.7 3.9 3.5*  --  3.5*  CL 105 101 99 100 96  --  92*  CO2 20 23 24 23 25   --  29  GLUCOSE 96 168* 144* 125* 72  --  99  BUN 57* 65* 69* 69* 79*  --  80*  CREATININE 4.03*  4.01* 3.90* 3.93* 3.97* 3.86* 3.80*  CALCIUM 8.6 8.6 9.0 9.0 9.1  --  8.7  PHOS 6.7* 6.3* 6.4*  --  6.6*  --  7.1*    Liver Function Tests:  Recent Labs Lab 06/19/13 0612  06/21/13 0610 06/22/13 1350 06/23/13 0340 06/24/13 0533 06/25/13 0510  AST 14  --   --   --   --   --   --   ALT 15  --   --   --   --   --   --   ALKPHOS 87  --   --   --   --   --   --   BILITOT 0.3  --   --   --   --   --   --   PROT 7.1  --   --   --   --   --   --   ALBUMIN 2.8*  2.8*  < > 2.8* 2.8* 3.0* 2.8* 2.7*  < > = values in this interval not displayed. No results found for this basename: LIPASE, AMYLASE,  in the last 168 hours No results found for this basename: AMMONIA,  in the last 168 hours  CBC:  Recent Labs Lab 06/19/13 0612  06/23/13 0600 06/24/13 1455  WBC 6.6 6.1 6.2  HGB 11.5* 11.5* 10.8*  HCT 36.0 35.4* 33.1*  MCV 85.5 85.3 83.6  PLT 136* 113* 117*    Cardiac Enzymes: No results found for this basename: CKTOTAL, CKMB, CKMBINDEX, TROPONINI,  in the last 168 hours  BNP: BNP (last 3 results)  Recent Labs  09/25/12 1743 09/29/12 0530  PROBNP 16961.0* 5247.0*    CBG:  Recent Labs Lab 06/24/13 1029 06/24/13 1651 06/24/13 1735 06/24/13 2121 06/25/13 0632  GLUCAP 112* 45* 99 102* 104*    Coagulation Studies:  Recent Labs  06/23/13 0600  LABPROT 14.7  INR 1.17     Imaging: Dg Chest Port 1 View  06/24/2013   CLINICAL DATA:  Central line placement  EXAM: PORTABLE CHEST - 1 VIEW  COMPARISON:  DG CHEST 2 VIEW dated 06/18/2013  FINDINGS: There is interval placement of a right central venous line with tip in distal SVC. No pneumothorax. There is bilateral mild airspace disease in the lung bases. There is small effusions.  IMPRESSION: 1. Right central venous line in good position without complication. 2. Increase in bibasilar airspace disease suggests pulmonary edema.   Electronically Signed   By: Genevive BiStewart  Edmunds M.D.   On: 06/24/2013 18:59     Medications:     Current Medications: . atorvastatin  80 mg Oral q1800  . budesonide-formoterol  2 puff Inhalation BID  . calcitRIOL  0.25 mcg Oral Daily  . calcium acetate  1,334 mg Oral TID WC  . cefUROXime  500 mg Oral Q24H  . clopidogrel  75 mg Oral Q breakfast  . doxycycline  100 mg Oral Q12H  . DULoxetine  60 mg Oral Daily  . furosemide  80 mg Intravenous BID  . heparin  5,000 Units Subcutaneous 3 times per day  . insulin aspart  0-9 Units Subcutaneous TID WC  . ketoconazole   Topical BID  . pantoprazole  40 mg Oral Daily  . pregabalin  50 mg Oral BID  . sildenafil  20 mg Oral TID  . sodium chloride  3 mL Intravenous Q12H    Infusions: . milrinone 0.25 mcg/kg/min (06/24/13 1520)     Assessment:   1) A/C systolic HF -  EF  15-20% (2013) 2) Acute on chronic renal failure, stage IV 3) DM2 4) HTN 5) PVD - R fem-pop (12/2012) 6) CAD - s/p CABG 2007  Plan/Discussion:    Taken to cath lab yesterday for RHC which showed low CO, mod PAH with elevated PVR and equalization of RV and LV filling pressures. She was started on milrinone and revatio 20 mg TID. Pending SPEP/UPEP, VQ scan and PFTs. Will need sleep study on outpatient side.  Continued to diurese on 80 mg IV lasix BID probably one more day and then transition to PO. Cr improved slightly. Will continue milrinone, awaiting co-ox. As above will continue work-up for PAH, will need repeat RHC to assess hemodynamics.    Ulla Potash B 7:53 AM  Patient seen with NP, agree with the above note.  RHC yesterday showed severe pulmonary HTN with elevated PVR.  PCWP was reasonable.  Question here is etiology of pulmonary arterial HTN: ? COPD, ? Vascular remodeling from chronically elevated left-sided pressures.  - Continue milrinone gtt today and IV Lasix x one more day.  Creatinine a bit decreased (renal following).  - Revatio started last night, will not adjust today.  If BP looks good, would consider increasing to 40 mg tid tomorrow.  - Would workup PAH with V/Q for chronic PE and for COPD with PFTs (all ordered).  - Check co-ox for output.   Marca Ancona 06/25/2013 8:17 AM

## 2013-06-26 ENCOUNTER — Inpatient Hospital Stay (HOSPITAL_COMMUNITY): Payer: Medicare Other

## 2013-06-26 DIAGNOSIS — N184 Chronic kidney disease, stage 4 (severe): Secondary | ICD-10-CM

## 2013-06-26 LAB — GLUCOSE, CAPILLARY
Glucose-Capillary: 167 mg/dL — ABNORMAL HIGH (ref 70–99)
Glucose-Capillary: 157 mg/dL — ABNORMAL HIGH (ref 70–99)
Glucose-Capillary: 180 mg/dL — ABNORMAL HIGH (ref 70–99)
Glucose-Capillary: 146 mg/dL — ABNORMAL HIGH (ref 70–99)

## 2013-06-26 LAB — APTT: APTT: 33 s (ref 24–37)

## 2013-06-26 LAB — PROTEIN ELECTROPHORESIS, SERUM
ALBUMIN ELP: 44.7 % — AB (ref 55.8–66.1)
Alpha-1-Globulin: 6.7 % — ABNORMAL HIGH (ref 2.9–4.9)
Alpha-2-Globulin: 12.6 % — ABNORMAL HIGH (ref 7.1–11.8)
Beta 2: 6.9 % — ABNORMAL HIGH (ref 3.2–6.5)
Beta Globulin: 6 % (ref 4.7–7.2)
Gamma Globulin: 23.1 % — ABNORMAL HIGH (ref 11.1–18.8)
M-SPIKE, %: NOT DETECTED g/dL
TOTAL PROTEIN ELP: 6.6 g/dL (ref 6.0–8.3)

## 2013-06-26 LAB — RENAL FUNCTION PANEL
Albumin: 2.9 g/dL — ABNORMAL LOW (ref 3.5–5.2)
BUN: 88 mg/dL — AB (ref 6–23)
CALCIUM: 9 mg/dL (ref 8.4–10.5)
CO2: 30 mEq/L (ref 19–32)
CREATININE: 3.9 mg/dL — AB (ref 0.50–1.10)
Chloride: 90 mEq/L — ABNORMAL LOW (ref 96–112)
GFR calc Af Amer: 13 mL/min — ABNORMAL LOW (ref >90)
GFR calc non Af Amer: 11 mL/min — ABNORMAL LOW (ref >90)
Glucose, Bld: 161 mg/dL — ABNORMAL HIGH (ref 70–99)
PHOSPHORUS: 8.5 mg/dL — AB (ref 2.3–4.6)
Potassium: 3.9 mEq/L (ref 3.7–5.3)
Sodium: 137 mEq/L (ref 137–147)

## 2013-06-26 LAB — CBC
HEMATOCRIT: 32.6 % — AB (ref 36.0–46.0)
HEMOGLOBIN: 10.9 g/dL — AB (ref 12.0–15.0)
MCH: 27.5 pg (ref 26.0–34.0)
MCHC: 33.4 g/dL (ref 30.0–36.0)
MCV: 82.3 fL (ref 78.0–100.0)
Platelets: 109 10*3/uL — ABNORMAL LOW (ref 150–400)
RBC: 3.96 MIL/uL (ref 3.87–5.11)
RDW: 15.4 % (ref 11.5–15.5)
WBC: 8.1 10*3/uL (ref 4.0–10.5)

## 2013-06-26 LAB — FIBRINOGEN: Fibrinogen: 472 mg/dL (ref 204–475)

## 2013-06-26 LAB — TECHNOLOGIST SMEAR REVIEW

## 2013-06-26 MED ORDER — TORSEMIDE 20 MG PO TABS
40.0000 mg | ORAL_TABLET | Freq: Two times a day (BID) | ORAL | Status: DC
  Administered 2013-06-26 – 2013-07-02 (×10): 40 mg via ORAL
  Filled 2013-06-26 (×14): qty 2

## 2013-06-26 MED ORDER — POLYETHYLENE GLYCOL 3350 17 G PO PACK
17.0000 g | PACK | Freq: Every day | ORAL | Status: DC
  Administered 2013-06-26 – 2013-07-01 (×4): 17 g via ORAL
  Filled 2013-06-26 (×7): qty 1

## 2013-06-26 MED ORDER — VANCOMYCIN HCL IN DEXTROSE 1-5 GM/200ML-% IV SOLN
1000.0000 mg | INTRAVENOUS | Status: AC
  Administered 2013-06-29: 1000 mg via INTRAVENOUS
  Filled 2013-06-26: qty 200

## 2013-06-26 MED ORDER — SENNA 8.6 MG PO TABS
2.0000 | ORAL_TABLET | Freq: Every day | ORAL | Status: DC
Start: 2013-06-26 — End: 2013-07-02
  Administered 2013-06-28 – 2013-07-01 (×3): 17.2 mg via ORAL
  Filled 2013-06-26 (×7): qty 2

## 2013-06-26 MED ORDER — ALBUTEROL SULFATE (2.5 MG/3ML) 0.083% IN NEBU
2.5000 mg | INHALATION_SOLUTION | Freq: Once | RESPIRATORY_TRACT | Status: AC
  Administered 2013-06-26: 2.5 mg via RESPIRATORY_TRACT

## 2013-06-26 NOTE — Progress Notes (Signed)
Patient ID: [name], female   DOB: [birth year], 68 y.o.   MRN: [id number] S:feels better O:BP 126/72  Pulse 113  Temp(Src) 98.6 F (37 C) (Oral)  Resp 18  Ht 5' (1.524 m)  Wt 71.4 kg (157 lb 6.5 oz)  BMI 30.74 kg/m2  SpO2 94%  Intake/Output Summary (Last 24 hours) at 06/26/13 1109 Last data filed at 06/26/13 0800  Gross per 24 hour  Intake  795.5 ml  Output   2100 ml  Net -1304.5 ml   Intake/Output: I/O last 3 completed shifts: In: 675.5 [P.O.:480; I.V.:195.5] Out: 3850 [Urine:3850]  Intake/Output this shift:  Total I/O In: 120 [P.O.:120] Out: -  Weight change: -5.258 kg (-11 lb 9.5 oz) Gen:WD WN WF in NAD CVS:no rub Resp:decreased BS at bases Abd:+bs, soft NT Ext:+edema   Recent Labs Lab 06/20/13 0400 06/21/13 0610 06/22/13 1350 06/23/13 0340 06/23/13 0600 06/24/13 0533 06/24/13 1455 06/25/13 0510 06/26/13 0500  NA 139 141 139 139 140 137  --  138 137  K 5.0 4.1 3.9 3.7 3.9 3.5*  --  3.5* 3.9  CL 106 105 101 99 100 96  --  92* 90*  CO2 18* 20 23 24 23 25   --  29 30  GLUCOSE 112* 96 168* 144* 125* 72  --  99 161*  BUN 52* 57* 65* 69* 69* 79*  --  80* 88*  CREATININE 4.00* 4.03* 4.01* 3.90* 3.93* 3.97* 3.86* 3.80* 3.90*  ALBUMIN 2.6* 2.8* 2.8* 3.0*  --  2.8*  --  2.7* 2.9*  CALCIUM 8.3* 8.6 8.6 9.0 9.0 9.1  --  8.7 9.0  PHOS 7.1* 6.7* 6.3* 6.4*  --  6.6*  --  7.1* 8.5*   Liver Function Tests:  Recent Labs Lab 06/24/13 0533 06/25/13 0510 06/26/13 0500  ALBUMIN 2.8* 2.7* 2.9*   No results found for this basename: LIPASE, AMYLASE,  in the last 168 hours No results found for this basename: AMMONIA,  in the last 168 hours CBC:  Recent Labs Lab 06/23/13 0600 06/24/13 1455  WBC 6.1 6.2  HGB 11.5* 10.8*  HCT 35.4* 33.1*  MCV 85.3 83.6  PLT 113* 117*   Cardiac Enzymes: No results found for this basename: CKTOTAL, CKMB, CKMBINDEX, TROPONINI,  in the last 168 hours CBG:  Recent Labs Lab 06/25/13 0632 06/25/13 1147 06/25/13 1644  06/25/13 2128 06/26/13 0616  GLUCAP 104* 87 207* 205* 167*    Iron Studies: No results found for this basename: IRON, TIBC, TRANSFERRIN, FERRITIN,  in the last 72 hours Studies/Results: Nm Pulmonary Perf And Vent  06/25/2013   CLINICAL DATA:  Pulmonary hypertension  EXAM: NUCLEAR MEDICINE VENTILATION - PERFUSION LUNG SCAN  TECHNIQUE: Ventilation images were obtained in multiple projections using inhaled aerosol technetium 99 M DTPA. Perfusion images were obtained in multiple projections after intravenous injection of Tc-42m MAA.  COMPARISON:  Chest radiograph 06/24/2013  RADIOPHARMACEUTICALS:  40.0 mCi Tc-72m DTPA aerosol and 6.0 mCi Tc-22m MAA  FINDINGS: Ventilation: Mild peripheral irregularity of ventilation.  Enlargement of cardiac silhouette with diminished ventilation in lingula and left lower lobe.  Mild central airway deposition of tracer.  Perfusion: Enlargement of cardiac silhouette.  Mild diminished perfusion to left lower lobe and lingula, less severe than ventilatory abnormality.  Single small subsegmental perfusion defect in left lower lobe.  No other perfusion defects identified.  Chest radiograph significant for enlargement of cardiac silhouette post CABG and bibasilar airspace disease.  IMPRESSION: Very low probability for pulmonary embolism.   Electronically  Signed   By: Ulyses SouthwardMark  Boles M.D.   On: 06/25/2013 11:45   Dg Chest Port 1 View  06/24/2013   CLINICAL DATA:  Central line placement  EXAM: PORTABLE CHEST - 1 VIEW  COMPARISON:  DG CHEST 2 VIEW dated 06/18/2013  FINDINGS: There is interval placement of a right central venous line with tip in distal SVC. No pneumothorax. There is bilateral mild airspace disease in the lung bases. There is small effusions.  IMPRESSION: 1. Right central venous line in good position without complication. 2. Increase in bibasilar airspace disease suggests pulmonary edema.   Electronically Signed   By: Genevive BiStewart  Edmunds M.D.   On: 06/24/2013 18:59   .  atorvastatin  80 mg Oral q1800  . budesonide-formoterol  2 puff Inhalation BID  . calcitRIOL  0.25 mcg Oral Daily  . calcium acetate  1,334 mg Oral TID WC  . clopidogrel  75 mg Oral Q breakfast  . doxycycline  100 mg Oral Q12H  . DULoxetine  60 mg Oral Daily  . furosemide  80 mg Intravenous BID  . heparin  5,000 Units Subcutaneous 3 times per day  . insulin aspart  0-9 Units Subcutaneous TID WC  . ketoconazole   Topical BID  . pantoprazole  40 mg Oral Daily  . polyethylene glycol  17 g Oral Daily  . pregabalin  50 mg Oral BID  . senna  2 tablet Oral Daily  . sildenafil  20 mg Oral TID  . sodium chloride  3 mL Intravenous Q12H    BMET    Component Value Date/Time   NA 137 06/26/2013 0500   K 3.9 06/26/2013 0500   CL 90* 06/26/2013 0500   CO2 30 06/26/2013 0500   GLUCOSE 161* 06/26/2013 0500   BUN 88* 06/26/2013 0500   CREATININE 3.90* 06/26/2013 0500   CALCIUM 9.0 06/26/2013 0500   GFRNONAA 11* 06/26/2013 0500   GFRAA 13* 06/26/2013 0500   CBC    Component Value Date/Time   WBC 6.2 06/24/2013 1455   WBC 9.2 12/19/2012 1601   RBC 3.96 06/24/2013 1455   RBC 4.66 12/19/2012 1601   HGB 10.8* 06/24/2013 1455   HGB 13.7 12/19/2012 1601   HCT 33.1* 06/24/2013 1455   HCT 42.9 12/19/2012 1601   PLT 117* 06/24/2013 1455   MCV 83.6 06/24/2013 1455   MCV 92.0 12/19/2012 1601   MCH 27.3 06/24/2013 1455   MCH 29.4 12/19/2012 1601   MCHC 32.6 06/24/2013 1455   MCHC 31.9 12/19/2012 1601   RDW 15.8* 06/24/2013 1455   LYMPHSABS 1.6 06/16/2013 1915   MONOABS 0.4 06/16/2013 1915   EOSABS 0.2 06/16/2013 1915   BASOSABS 0.0 06/16/2013 1915    Assessment/Plan:  1. CHF- acute on chronic systolic now s/p RHC c/w restrictive CMP. Agree with plan for milrinone to max cardiac output and hopefully improve cardiorenal syndrome.  1. Diuresed almost 1.4L over the last 24 hours with bump in Scr 2. Cont with current plan per HF team 2. 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   1. Appreciate HF team's input. Diuresing and Scr stable but above baseline.  2. Pt wants to hold off on HD for now. No urgent indication for HD and will cont to follow after trial of diuresis and inotropes but is now willing to have permanent access placed during this hospitalization.  3. Ordered vein mapping VVS consult for placement of AVG/AVF hopefully by early next week.  2.  Pulm HTN - work up underway 3. Hypokalemia- replete and follow 4. ACDz- stable 5. SHPTH: on vit D and binders 6. UTI- on ceftin and doxy (cellulitis resolved) 7. HTN- stable 8. DM- per primary svc 9. Vascular access- will need longterm access and possible PC during this hospitalization depending upon her renal function 10.   Caesar Mannella A

## 2013-06-26 NOTE — Progress Notes (Signed)
CARDIAC REHAB PHASE I   PRE:  Rate/Rhythm: 112 ST    BP: sitting 118/60    SaO2: 88 2 1/2L left, right 93 2 1/2L  MODE:  Ambulation: 200 ft   POST:  Rate/Rhythm: 125 ST    BP: sitting 134/69     SaO2: 93 3L  Pt weaker today, lethargic. Easily falls asleep. Willing to walk but fatigued easily. To BR before walk. Many rest stops. C/o weak, fatiguing arms. Reminders to stand tall and closer in RW. HR up to 125 ST walking. Had to sit in chair closer to door on return to room to rest. Hands trembling. Able to walk to recliner, reminders to hold to RW and not reach for furniture. Grandaughter brought regular coffee, didn't allow pt to drink it. Gave her decaf. Will f/u as x2. [phone]  Elissa Lovett [place] CES, ACSM 06/26/2013 3:13 PM

## 2013-06-26 NOTE — Consult Note (Signed)
[facility] Consult  Reason for Consult:  Permanent HD access placement Referring Physician:  [place] MRN #:  [id number]  History of Present Illness: This is a 67 y.o. female who is known to our practice as she underwent a right femoral to popliteal bypass 01/20/13.  She returned to the office 10 days later with swelling in her right groin and redness.  She went to the OR for I&D and wound vac placement 02/06/13.  She had chronic renal failure at that time.  Dr. Elda Dunkerson was concerned with her nutritional status for wound healing and a kangaroo tube was placed for TF.  At that time, renal was consulted for hyperkalemia.  She had been started on an ARB (not sure of reason per note), but this was discontinued.    At her last office visit 04/23/13, she continued to have slow wound healing with a 3x3 wound.  Her vac was switched to hydrogel daily.  She was to f/u in one month. She states that her groin wound has healed.  She also states that she was walking with a cane, but is now back to a wheelchair and walker.  She states that her left leg doesn't want to work, but she is making it.  She states she was enjoying her right leg working again.    ABI's on 06/19/13 are as follows:  RIGHT    LEFT     PRESSURE  WAVEFORM   PRESSURE  WAVEFORM   BRACHIAL  147  Tri  BRACHIAL  137  Tri   DP    DP     AT  144  Bi  AT  80  Mono   PT  154  Bi  PT  67  Damp mono   PER    PER     GREAT TOE   NA  GREAT TOE   NA     RIGHT  LEFT   ABI  1.05, within normal limits  0.54, moderate disease    She presented to the hospital on 06/16/13 with cellulitis and a painful ulcer on her left shin.  She noticed a small area on her left shin about 7-10 days prior to admission.  During evaluation, she was found to have acute kidney injury.  She is in need of permanent access and [facility] is consulted.  She also has chronic CHF with last EF 15-20% in 2013.  She has had ~ 30 lb weight gain over the past 3-4  months and edema and a sore over her left shin, which was very painful to touch and red around it.  She states this has improved significantly.  She denies any CP or SOB.    She is on a statin for her cholesterol.  She is on insulin for her DM.     Past Medical History  Diagnosis Date  . Coronary artery disease     a. s/p CABG 2007 (L-RI, S-dRCA, S-LAD, S-OM);  b. myoview 6/13:  anteroapical MI, no ischemia, EF 31%  . Diabetes mellitus   . Hypertension   . Asthma   . Bronchitis   . Reflux   . Hyperlipidemia   . Chronic systolic heart failure     a. echo 6/13: mild LVH, EF 15-20%, diff HK, Gr 1 DD, mild reduced RVSF, PASP 32.  . GERD (gastroesophageal reflux disease)   . Arthritis   . CKD (chronic kidney disease)   . Chronic back pain   . Hypoxia       a. Adm 09/2012 - discharged with home O2 in setting of CHF.  . Abnormal stress test     a. Nuc 10/2011: bright target which may be in left breast or L chest wall or axilla. Pt reports she f/u with PCP and mammogram was OK (left breast cyst).  . Complication of anesthesia     slow to awaken  . 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 Anginal pain     "rarely, had some 2 months ago, not sure if heart or arm, cardiologist aware.  . CHF (congestive heart failure)   . Shortness of breath     at times- sensitive to perfume.  . Peripheral vascular disease   . Depression   . Stroke 05/2011    a. R ischemic CVA 05/2011. Right hand weakness.  . Pneumonia     2010ish  . Neuropathic pain     feet and hands  . Polio     as a child  . Altered level of consciousness     2 weeks now  . Cellulitis 06/2013    LEFT LOWER EXTREMITY   Past Surgical History  Procedure Laterality Date  . Coronary artery bypass graft      LIMA to RI, SVG to distal RCA, SVG to LAD, SVG to OM. 2007  . Joint replacement      Right hip replacement   . Debridement skin / sq / muscle of trunk      s/p staph infection from CABG 2007  . Cystectomy       Subcutaneous  . Partial hip arthroplasty    . Stroke    . Femoral-popliteal bypass graft Right 01/20/2013    Procedure: BYPASS GRAFT RIGHT FEMORAL-BELOW KNEE POPLITEAL ARTERY WITH RINGED GORTEX GRAFT ;  Surgeon: Shantella Blubaugh E Karlton Maya, MD;  Location: [facility] OR;  Service: Vascular;  Laterality: Right;  . Patch angioplasty Right 01/20/2013    Procedure: VEIN  PATCH ANGIOPLASTY;  Surgeon: Norris Brumbach E Ashante Yellin, MD;  Location: [facility] OR;  Service: Vascular;  Laterality: Right;  . I&d extremity Right 02/06/2013    Procedure: DEBRIDEMENT RIGHT GROIN WOUND & APPLICATION OF WOUND VAC.;  Surgeon: Jenan Ellegood E Zuleica Seith, MD;  Location: [facility] OR;  Service: Vascular;  Laterality: Right;  . Groin debridement Right 02/11/2013    Procedure: GROIN DEBRIDEMENT & WOUND VAC APPLICATION;  Surgeon: Viraaj Vorndran E Amine Adelson, MD;  Location: [facility] OR;  Service: Vascular;  Laterality: Right;  . I&d extremity Right 02/23/2013    Procedure: IRRIGATION AND DEBRIDEMENT RIGHT GROIN;  Surgeon: Syrianna Schillaci E Aleina Burgio, MD;  Location: [facility] OR;  Service: Vascular;  Laterality: Right;  . Application of wound vac Right 02/23/2013    Procedure: APPLICATION OF WOUND VAC;  Surgeon: Abeni Finchum E Izeah Vossler, MD;  Location: [facility];  Service: Vascular;  Laterality: Right;  . Hematoma evacuation Right 02/12/2013    Procedure: EVACUATION HEMATOMA;  Surgeon: Brian L Chen, MD;  Location: [facility] OR;  Service: Vascular;  Laterality: Right;    Allergies  Allergen Reactions  . Sulfa Antibiotics Anaphylaxis and Shortness Of Breath  . Codeine Nausea Only and Other (See Comments)    Severe headache  . Ivp Dye [Iodinated Diagnostic Agents] Hives and Other (See Comments)    Severe anxiety  . Penicillins Swelling and Other (See Comments)    "huge sores". Pt has tolerated both Zosyn and Keflex in the past.    Prior to Admission medications   Medication Sig Start Date End Date Taking? Authorizing Provider  albuterol (  PROVENTIL HFA;VENTOLIN HFA) 108 (90 BASE) MCG/ACT inhaler Inhale 2 puffs into the lungs  every 6 (six) hours as needed for wheezing or shortness of breath.    Yes Historical Provider, MD  ALPRAZolam (XANAX) 0.5 MG tablet Take 0.5 mg by mouth daily as needed for anxiety.    Yes Historical Provider, MD  budesonide-formoterol (SYMBICORT) 160-4.5 MCG/ACT inhaler Inhale 2 puffs into the lungs 2 (two) times daily.    Yes Historical Provider, MD  clopidogrel (PLAVIX) 75 MG tablet Take 75 mg by mouth daily with breakfast.   Yes Historical Provider, MD  diphenhydrAMINE (BENADRYL) 25 mg capsule Take 25 mg by mouth every 6 (six) hours as needed for allergies.    Yes Historical Provider, MD  DULoxetine (CYMBALTA) 60 MG capsule Take 60 mg by mouth daily.   Yes Historical Provider, MD  insulin glargine (LANTUS) 100 UNIT/ML injection Inject 28 Units into the skin at bedtime.    Yes Historical Provider, MD  insulin lispro (HUMALOG) 100 UNIT/ML injection Inject 6-11 Units into the skin 3 (three) times daily before meals. Per sliding scale   Yes Historical Provider, MD  nitroGLYCERIN (NITROSTAT) 0.4 MG SL tablet Place 0.4 mg under the tongue every 5 (five) minutes as needed for chest pain.    Yes Historical Provider, MD  pantoprazole (PROTONIX) 40 MG tablet Take 40 mg by mouth daily.   Yes Historical Provider, MD  pregabalin (LYRICA) 50 MG capsule Take 50 mg by mouth 2 (two) times daily.   Yes Historical Provider, MD  rosuvastatin (CRESTOR) 40 MG tablet Take 40 mg by mouth daily.   Yes Historical Provider, MD  torsemide (DEMADEX) 20 MG tablet Take 20 mg by mouth 2 (two) times daily. 2 tab daily by mouth 02/27/13  Yes Emma M Collins, PA-C  ciprofloxacin (CIPRO) 750 MG tablet Take 750 mg by mouth daily with breakfast. Finished sometime in January per patient 03/19/13   Jeryn Bertoni E Jaquawn Saffran, MD    History   Social History  . Marital Status: Divorced    Spouse Name: N/A    Number of Children: 4  . Years of Education: N/A   Occupational History  . Retired  [place]    [employer]   Social History Main  Topics  . Smoking status: Former Smoker -- 0.00 packs/day for 50 years    Types: Cigarettes    Quit date: 01/06/2013  . Smokeless tobacco: Never Used  . Alcohol Use: No  . Drug Use: No  . Sexual Activity: Not on file   Other Topics Concern  . Not on file   Social History Narrative   Lives with grandson 23 years old.     Family History  Problem Relation Age of Onset  . Coronary artery disease Father 52    Unclear if it was an MI  . Stroke Mother 80  . Coronary artery disease Brother 68  . Coronary artery disease Brother 68    ROS: [x] Positive   [ ] Negative   [ ] All sytems reviewed and are negative  Cardiovascular: [x] chest pain/pressure [] palpitations [] SOB lying flat [] DOE [] pain in legs while walking [] pain in legs at rest [] pain in legs at night [x] ulcer left shin [] hx of DVT [x] swelling in legs  Pulmonary: [] productive cough [] asthma/wheezing [] home O2  Neurologic: [] weakness in [] arms [] legs [] numbness in [] arms [] legs [x] hx of CVA with right arm   weakness that has resolved.[] mini stroke []difficulty speaking or slurred speech [] temporary loss of vision in one eye [] dizziness  Hematologic: [] hx of cancer [] bleeding problems [] problems with blood clotting easily  Endocrine:   [x] diabetes [] thyroid disease  GI [] vomiting blood [] blood in stool [x] GERD  GU: [x] CKD/renal failure [] HD--[] M/W/F or [] T/T/S [] burning with urination [] blood in urine  Psychiatric: [] anxiety [x] depression  Musculoskeletal: [x] arthritis [] joint pain  Integumentary: [] rashes [x] ulcers  Constitutional: [] fever [] chills   Physical Examination  Filed Vitals:   06/26/13 0538  BP: 126/72  Pulse: 113  Temp: 98.6 F (37 C)  Resp: 18   Body mass index is 30.74 kg/(m^2).  General:  WDWN in NAD Gait: Not observed HENT: WNL, normocephalic Eyes: Pupils equal Pulmonary: normal non-labored breathing, without  Rales, rhonchi,  wheezing Cardiac: regular without  Murmurs, rubs or gallops; without carotid bruits on the left.  There is central line in place on the right. Abdomen: soft, NT/ND, no masses Skin: without rashes, with ulcers  Vascular Exam/Pulses:+ palpable radial pulses bilaterally; + palpable femoral pulses bilaterally; + palpable right DP; pedal pulses in right leg are not palpable. Extremities: without ischemic changes, without Gangrene , without cellulitis; without open wounds; dry, pre tibial ulcer bilaterally; there is a small ulcer on the right 2nd toe Musculoskeletal: no muscle wasting or atrophy  Neurologic: A&O X 3; Appropriate Affect ; SENSATION: normal; MOTOR FUNCTION:  moving all extremities equally. Speech is fluent/normal   CBC    Component Value Date/Time   WBC 6.2 06/24/2013 1455   WBC 9.2 12/19/2012 1601   RBC 3.96 06/24/2013 1455   RBC 4.66 12/19/2012 1601   HGB 10.8* 06/24/2013 1455   HGB 13.7 12/19/2012 1601   HCT 33.1* 06/24/2013 1455   HCT 42.9 12/19/2012 1601   PLT 117* 06/24/2013 1455   MCV 83.6 06/24/2013 1455   MCV 92.0 12/19/2012 1601   MCH 27.3 06/24/2013 1455   MCH 29.4 12/19/2012 1601   MCHC 32.6 06/24/2013 1455   MCHC 31.9 12/19/2012 1601   RDW 15.8* 06/24/2013 1455   LYMPHSABS 1.6 06/16/2013 1915   MONOABS 0.4 06/16/2013 1915   EOSABS 0.2 06/16/2013 1915   BASOSABS 0.0 06/16/2013 1915    BMET    Component Value Date/Time   NA 137 06/26/2013 0500   K 3.9 06/26/2013 0500   CL 90* 06/26/2013 0500   CO2 30 06/26/2013 0500   GLUCOSE 161* 06/26/2013 0500   BUN 88* 06/26/2013 0500   CREATININE 3.90* 06/26/2013 0500   CALCIUM 9.0 06/26/2013 0500   GFRNONAA 11* 06/26/2013 0500   GFRAA 13* 06/26/2013 0500    COAGS: Lab Results  Component Value Date   INR 1.11 06/25/2013   INR 1.17 06/23/2013   INR 1.09 01/13/2013     Non-Invasive Vascular Imaging:    Vein mapping 06/24/13:  Cephalic - Right Segment  Diameter  Depth  Comment   1. Axilla  4.56mm     2. Mid upper  arm  5.88mm     3. Above AC  5.02mm     4. In AC  6.21mm     5. Below AC  4.23mm   Multiple branches   6. Mid forearm  2.65mm   Branch   7. Wrist    Unable to visualize due to IV placement.                     Cephalic - left Segment  Diameter  Depth  Comment   1. Axilla  4.34mm     2. Mid upper arm  4.62mm     3. Above AC  4.56mm     4. In AC  5.62mm     5. Below AC  4.51mm     6. Mid forearm  5.12mm     7. Wrist  2.4mm        Statin:  yes Beta Blocker:  no Aspirin:  no ACEI:  no ARB:  no Other antiplatelets/anticoagulants:  yes-Plavix   ASSESSMENT: This is a 67 y.o. female with AKI/CKD in need of HD permanent access.  She is right hand dominant.  PLAN: -will plan on a left radiocephalic vs left brachiocephalic AVF on Monday.  Since there is no urgent need for HD at this time, will not place diatek at this time. -will have them place restricted extremity on left arm. -NPO after MN on Sunday for procedure Monday morning   Samantha Rhyne, PA-C [facility] [phone]  History and exam details as above Left arm AV fistula by Dr Early on Monday  Theophil Thivierge, MD [facility] [place] Office: [phone] Pager: [phone]  

## 2013-06-26 NOTE — Progress Notes (Signed)
Advanced Heart Failure Team Consult Note  Referring Physician: Dr. Arrie Aranoladonato Primary Physician: Dr. Hale BogusMara Primary Cardiologist:  Dr. Elease HashimotoNahser  Reason for Consultation: Heart Failure   HPI:    [name] is a 68 yo female with a history of CAD s/p CABG (2007), ICM, chronic systolic HF EF ~25%, HTN, DM2, PVD s/p R fem-pop (12/2012) complicated by necrotic anterior abdominal wall fascia and lateral soft tissues and CKD stage III-IV. Last myoview was 10/2011 which was negative for ischemia. 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 Over the past 6 months reports a marked decrease in functional capacity. She has had 3 readmissions in the past 6 months. Complaints of increased weight gain ~ 30 lbs over past 3-4 months, SOB, CP and edema. Her kidney function has continued to decline as well. Has had several episodes of exertional chest pressure recently.   Baseline CR 5/14 Cr 1.4 9/14 Cr 1.4 10/14 Episode acute renal failure 4.5->2.1 2/15  Admit Cr 3.77-> 4.01-> 3.9  RHC 06/24/13 RA = 16  RV = 65/7/17  PA = 70/34 (47)  PCW = 17  Fick cardiac output/index = 3.4/1.9  Thermo CO/CI = 2.9/1.7  PVR = 10 WU  FA sat = 95%  PA sat = 50%, 51%  FEV1 1.04L (52%) FVC    1.52L (58%) FEV1/FVC 68% FEF 25-75%  0.50L (27%) DLCO 29%   Was started on IV milrinone and revatio 20 mg TID after cath. Weight down 7 lbs, 24 hr I/O -1.7 liters. VQ scan yesterday low probability for PE. Went for PFTs and was not able to complete d/t she had emesis. Plan AV fistula on Monday. Cr increased 3.9. Co-ox 69%.   Objective:    Vital Signs:   Temp:  [97 F (36.1 C)-98.6 F (37 C)] 97.9 F (36.6 C) (02/20 1321) Pulse Rate:  [97-113] 105 (02/20 1321) Resp:  [18] 18 (02/20 1321) BP: (114-126)/(54-72) 123/66 mmHg (02/20 1321) SpO2:  [92 %-98 %] 98 % (02/20 1321) FiO2 (%):  [2.5 %] 2.5 % (02/20 1321) Weight:   [157 lb 6.5 oz (71.4 kg)] 157 lb 6.5 oz (71.4 kg) (02/20 0538) Last BM Date: 06/24/13  Weight change: Filed Weights   06/24/13 1040 06/25/13 0500 06/26/13 0538  Weight: 169 lb (76.658 kg) 164 lb 4.8 oz (74.526 kg) 157 lb 6.5 oz (71.4 kg)    Intake/Output:   Intake/Output Summary (Last 24 hours) at 06/26/13 1335 Last data filed at 06/26/13 1300  Gross per 24 hour  Intake  795.5 ml  Output   2100 ml  Net -1304.5 ml     Physical Exam: General:  Elderly. Lying in bed No resp difficulty HEENT: normal Neck: supple. JVP 8-9. Carotids 2+ bilat; no bruits. No lymphadenopathy or thryomegaly appreciated. Cor: PMI nondisplaced. Regular rate & rhythm. No rubs, gallops or murmurs. Lungs: clear Abdomen: soft, nontender, + distended. No hepatosplenomegaly. No bruits or masses. Good bowel sounds. Extremities: no cyanosis, clubbing, rash, 1-2+ bilateral edema Neuro: alert & orientedx3, cranial nerves grossly intact. moves all 4 extremities w/o difficulty. Affect pleasant   Labs: Basic Metabolic Panel:  Recent Labs Lab 06/22/13 1350 06/23/13 0340 06/23/13 0600 06/24/13 0533 06/24/13 1455 06/25/13 0510 06/26/13 0500  NA 139 139 140 137  --  138 137  K 3.9 3.7 3.9 3.5*  --  3.5* 3.9  CL 101 99 100 96  --  92* 90*  CO2 23 24 23 25   --  29 30  GLUCOSE 168* 144* 125* 72  --  99 161*  BUN 65* 69* 69* 79*  --  80* 88*  CREATININE 4.01* 3.90* 3.93* 3.97* 3.86* 3.80* 3.90*  CALCIUM 8.6 9.0 9.0 9.1  --  8.7 9.0  PHOS 6.3* 6.4*  --  6.6*  --  7.1* 8.5*    Liver Function Tests:  Recent Labs Lab 06/22/13 1350 06/23/13 0340 06/24/13 0533 06/25/13 0510 06/26/13 0500  ALBUMIN 2.8* 3.0* 2.8* 2.7* 2.9*   No results found for this basename: LIPASE, AMYLASE,  in the last 168 hours No results found for this basename: AMMONIA,  in the last 168 hours  CBC:  Recent Labs Lab 06/23/13 0600 06/24/13 1455  WBC 6.1 6.2  HGB 11.5* 10.8*  HCT 35.4* 33.1*  MCV 85.3 83.6  PLT 113* 117*     Cardiac Enzymes: No results found for this basename: CKTOTAL, CKMB, CKMBINDEX, TROPONINI,  in the last 168 hours  BNP: BNP (last 3 results)  Recent Labs  09/25/12 1743 09/29/12 0530  PROBNP 16961.0* 5247.0*    CBG:  Recent Labs Lab 06/25/13 1147 06/25/13 1644 06/25/13 2128 06/26/13 0616 06/26/13 1227  GLUCAP 87 207* 205* 167* 157*    Coagulation Studies:  Recent Labs  06/25/13 2010  LABPROT 14.1  INR 1.11     Imaging: Nm Pulmonary Perf And Vent  06/25/2013   CLINICAL DATA:  Pulmonary hypertension  EXAM: NUCLEAR MEDICINE VENTILATION - PERFUSION LUNG SCAN  TECHNIQUE: Ventilation images were obtained in multiple projections using inhaled aerosol technetium 99 M DTPA. Perfusion images were obtained in multiple projections after intravenous injection of Tc-41m MAA.  COMPARISON:  Chest radiograph 06/24/2013  RADIOPHARMACEUTICALS:  40.0 mCi Tc-38m DTPA aerosol and 6.0 mCi Tc-22m MAA  FINDINGS: Ventilation: Mild peripheral irregularity of ventilation.  Enlargement of cardiac silhouette with diminished ventilation in lingula and left lower lobe.  Mild central airway deposition of tracer.  Perfusion: Enlargement of cardiac silhouette.  Mild diminished perfusion to left lower lobe and lingula, less severe than ventilatory abnormality.  Single small subsegmental perfusion defect in left lower lobe.  No other perfusion defects identified.  Chest radiograph significant for enlargement of cardiac silhouette post CABG and bibasilar airspace disease.  IMPRESSION: Very low probability for pulmonary embolism.   Electronically Signed   By: Ulyses Southward M.D.   On: 06/25/2013 11:45   Dg Chest Port 1 View  06/24/2013   CLINICAL DATA:  Central line placement  EXAM: PORTABLE CHEST - 1 VIEW  COMPARISON:  DG CHEST 2 VIEW dated 06/18/2013  FINDINGS: There is interval placement of a right central venous line with tip in distal SVC. No pneumothorax. There is bilateral mild airspace disease in the lung  bases. There is small effusions.  IMPRESSION: 1. Right central venous line in good position without complication. 2. Increase in bibasilar airspace disease suggests pulmonary edema.   Electronically Signed   By: Genevive Bi M.D.   On: 06/24/2013 18:59     Medications:     Current Medications: . atorvastatin  80 mg Oral q1800  . budesonide-formoterol  2 puff Inhalation BID  . calcitRIOL  0.25 mcg Oral Daily  . calcium acetate  1,334 mg Oral TID WC  . clopidogrel  75 mg Oral Q breakfast  . doxycycline  100 mg Oral Q12H  . DULoxetine  60 mg Oral Daily  . furosemide  80 mg Intravenous BID  . heparin  5,000  Units Subcutaneous 3 times per day  . insulin aspart  0-9 Units Subcutaneous TID WC  . ketoconazole   Topical BID  . pantoprazole  40 mg Oral Daily  . polyethylene glycol  17 g Oral Daily  . pregabalin  50 mg Oral BID  . senna  2 tablet Oral Daily  . sildenafil  20 mg Oral TID  . sodium chloride  3 mL Intravenous Q12H  . [START ON 06/29/2013] vancomycin  1,000 mg Intravenous To OR    Infusions: . milrinone 0.25 mcg/kg/min (06/25/13 2148)     Assessment:   1) A/C systolic HF - EF 60-45% (2013) 2) Acute on chronic renal failure, stage IV 3) DM2 4) HTN 5) PVD - R fem-pop (12/2012) 6) CAD - s/p CABG 2007  Plan/Discussion:    Taken to cath lab 2/18 for RHC which showed low CO, mod PAH with elevated PVR and equalization of RV and LV filling pressures. She was started on milrinone and revatio 20 mg TID. Pending SPEP/UPEP. Will need sleep study on outpatient side.  Volume status continues to improve, however Cr increasing. Will stop IV lasix today and transition to demadex PO 40 mg BID. VQ scan showed low probability of PE. PFTs show significant restrictive/obstructive physiology. Continue milrinone currently. Will continue sildenafil cautiously and watch for shunting. Suspect we will have to stop when HD initiated due to BP issues. Going for AV fistula Monday.   Ulla Potash B NP-C 1:35 PM  Patient seen and examined with Ulla Potash, NP. We discussed all aspects of the encounter. I agree with the assessment and plan as stated above.   Remains very tenuous. Diuresing well but renal function continues to deteriorate slowly. Will continue milrinone for now. Will change diuretics to po demadex and see how she does. Will continue sildenafil for now. Given low cardiac output and PAH suspect she may not tolerate HD well.   I suspect PAH is due to longstanding elevations in left heart pressures in conjunction with OSA/OHS.   Reuel Boom Arelly Whittenberg,MD 6:40 PM

## 2013-06-26 NOTE — Progress Notes (Addendum)
Physical Therapy Treatment Patient Details Name: [name] MRN: [id number] DOB: 11-07-1945 Today's Date: 06/26/2013 Time: 0932-1004 PT Time Calculation (min): 32 min  PT Assessment / Plan / Recommendation  History of Present Illness 68 y.o. female admitted to [facility] on 06/16/13 with with significant prior vascular history including peripheral vascular disease status post femoropopliteal bypass with claudication, coronary artery disease status post multiple MIs status post CABG, CVA, type 2 diabetes, chronic renal insufficiency with a baseline creatinine of 2.7 presenting with cellulitis and acute kidney injury. Per the patient she noticed a small on the left anterior shin about 7-10 days ago. Of note, patient had a very extended hospitalization for essentially the entire month of October for nonhealing right groin wound.   PT Comments   Pt able to progress with mobility during session. Pt continues to ambulate at very decreased gt speed and requires multiple rest breaks secondary to fatigue and deconditioning. Pt also requesting during session to go into bathroom and use toilet; requires (A) to maintain balance with hygiene. Pt will require 24/7 physical (A) upon acute D/C. Will cont to follow per POC.  Pt may benefit from Rolator for mobility upon acute D/C due to fatigue. Pt stated she knows what one is but would like to attempt to ambulate with it.   Follow Up Recommendations  Home health PT;Supervision - Intermittent     Does the patient have the potential to tolerate intense rehabilitation     Barriers to Discharge        Equipment Recommendations  Other (comment) (rollator )    Recommendations for Other Services OT consult  Frequency Min 3X/week   Progress towards PT Goals Progress towards PT goals: Progressing toward goals  Plan Current plan remains appropriate    Precautions / Restrictions Precautions Precautions: Fall Restrictions Weight Bearing Restrictions: No    Pertinent Vitals/Pain 1/10 in LEs; LEs elevated in chair .     Mobility  Bed Mobility Overal bed mobility: Needs Assistance Bed Mobility: Supine to Sit Supine to sit: Min assist;HOB elevated General bed mobility comments: min (A) to elevate shoulders to sitting position; requries incr time; cues for sequencing  Transfers Overall transfer level: Needs assistance Equipment used: Rolling walker (2 wheeled) Transfers: Sit to/from Stand Sit to Stand: Min guard General transfer comment: cues for hand placement and sequencing with RW; min guard to steady; posterior lean bias Ambulation/Gait Ambulation/Gait assistance: Min guard Ambulation Distance (Feet): 140 Feet (with multiple standing rest breaks) Assistive device: Rolling walker (2 wheeled) Gait Pattern/deviations: Decreased stride length;Trunk flexed;Shuffle Gait velocity: decreased Gait velocity interpretation: <1.8 ft/sec, indicative of risk for recurrent falls General Gait Details: pt required multiple standing rest breaks due to fatigue; cues for incr stride length and upright posture; pt with fwd head and flexed posture secondary to back pain and kyphosis; pt veering in different directions at times; cues for RW management          PT Diagnosis:    PT Problem List:   PT Treatment Interventions:     PT Goals (current goals can now be found in the care plan section) Acute Rehab PT Goals Patient Stated Goal: to go home PT Goal Formulation: With patient Time For Goal Achievement: 07/03/13 Potential to Achieve Goals: Good  Visit Information  Last PT Received On: 06/26/13 Assistance Needed: +1 History of Present Illness: 68 y.o. female admitted to [facility] on 06/16/13 with with significant prior vascular history including peripheral vascular disease status post femoropopliteal bypass with claudication, coronary  artery disease status post multiple MIs status post CABG, CVA, type 2 diabetes, chronic renal insufficiency with a baseline  creatinine of 2.7 presenting with cellulitis and acute kidney injury. Per the patient she noticed a small on the left anterior shin about 7-10 days ago. Of note, patient had a very extended hospitalization for essentially the entire month of October for nonhealing right groin wound.    Subjective Data  Subjective: pt lying supine and lethargic initially; agreeable to ambulate  Patient Stated Goal: to go home   Cognition  Cognition Arousal/Alertness: Awake/alert Behavior During Therapy: WFL for tasks assessed/performed Overall Cognitive Status: Within Functional Limits for tasks assessed    Balance  Balance Overall balance assessment: Needs assistance Sitting-balance support: Feet supported;Single extremity supported Sitting balance-Leahy Scale: Fair Sitting balance - Comments: pt leaning anteriorly Standing balance support: During functional activity;Single extremity supported Standing balance-Leahy Scale: Poor Standing balance comment: pt unsteady; requires min guard to steady and support while she performs hygiene in bathroom; required bracing of bil elbows to perform hand hygiene at sink; tolareted standing at sink ~4 min; + poseterior sway High level balance activites: Direction changes High Level Balance Comments: slightly unsteady with RW   End of Session PT - End of Session Equipment Utilized During Treatment: Gait belt Activity Tolerance: Patient tolerated treatment well Patient left: in chair;with call bell/phone within reach;with family/visitor present Nurse Communication: Mobility status   GP     [name], [name], [place]PT [phone] 06/26/2013, 2:10 PM

## 2013-06-26 NOTE — Progress Notes (Signed)
[facility] PROGRESS NOTE  [name] BUL:[id number] DOB: [birth year] DOA: 06/16/2013 PCP: [name], MD  Assessment/Plan: Cellulitis:  - Improving. Continue doxycycline 1 additional day to complete 10 days of therapy  UTI  - E coli resistant to FQ- outpatient study. Sensitive to rocephin.   -Finished 7 days of cefuroxime 06/25/2013 Acute on chronic renal failure (CKD4)  -Likely due to acute infection and a degree of cardiorenal syndrome  - Creatinine last year was over 2. Sees Dr. Marisue Humble  -Appreciate nephrology followup  -May need permacath  -cards has patient on IV lasix for further diuresis  -AVF planned for 06/29/13 Acute on Chronic systolic and diastolic congestive heart failure  -EF 15-20%  - furosemide IV per cardiology-->neg 1.7 liters in past 24 hours  -Switched to oral torsemide 40mg  bid on 06/26/13 -Appreciate Dr. Gala Romney followup  -right heart catheterization--markedly elevated right heart pressures with moderate pulmonary hypertension--PAP=70/37 -V/Q scan--very low probability  -Await UPEP  -remains on milrinone PAH  -Likely a combination of COPD and left heart failure  -Workup in progress per cardiology  -Await UPEP  -PFTs---FEV1=52% suggests COPD -V/Q scan--very low probability  -need outpt polysomnography  -sildenafil started 06/24/13  PVD (eripheral vascular disease) with claudication:  -ABI shows right WNL and left with moderate disease- can follow up outpatient with vascular  Tinea pedis:  Antifungals topical  Tobacco abuse  -encourage cessation  Venous stasis dermatitis  Hyperlipidemia  -Continue Lipitor  DM2  -HgbA1C- 7.9  -Patient had episodes of hypoglycemia due to poor po intake  -d/c lantus and observe  -Continue NovoLog sliding scale-->CBG 155-205 Family Communication: updated Granddaughter yesterday  Disposition Plan: Home when medically stable      Procedures/Studies: Dg Chest 2 View  06/18/2013   CLINICAL DATA:   Shortness of breath, history of CHF  EXAM: CHEST  2 VIEW  COMPARISON:  DG CHEST 1V PORT dated 02/11/2013  FINDINGS: Low lung volumes. Cardiac silhouette is enlarged. Patient is status post median sternotomy coronary artery bypass grafting. There is diffuse prominence of the interstitial markings and mild peribronchial cuffing. Areas of increased density projects within the lung bases. No focal regions of consolidation appreciated. There is blunting of the costophrenic angles. The osseous structures unremarkable.  IMPRESSION: Interstitial findings likely representing pulmonary edema. A component of chronic bronchitic changes also a diagnostic consideration. Bibasilar infiltrate versus atelectasis. Surveillance evaluation recommended.   Electronically Signed   By: [name] M.D.   On: 06/18/2013 18:16   Dg Tibia/fibula Left  06/16/2013   CLINICAL DATA:  68 year old female with lower extremity soft tissue injury, abrasions/burning, infected. Initial encounter.  EXAM: LEFT TIBIA AND FIBULA - 2 VIEW  COMPARISON:  Left lower extremity venous ultrasound from the same day reported separately.  FINDINGS: Small medial surgical clips near the level of the knee joint. Evidence of widespread subcutaneous stranding and increased density at the level of the tib-fib. Questionable increased striated pattern of muscles in the calf. No subcutaneous gas. Calcified atherosclerosis. No radiopaque foreign body identified.  No acute fracture or dislocation identified in the tibia or fibula. Bulky chronic degenerative spurring/heterotopic ossification at the tibial tuberosity. No cortical osteolysis identified.  IMPRESSION: 1. Increased soft tissue density compatible with cellulitis in this clinical setting. No subcutaneous gas or retained foreign body identified. 2. No acute osseous abnormality identified. Advanced chronic posttraumatic / degenerative changes at the tibial tuberosity.   Electronically Signed   By: Augusto Gamble M.D.    On: 06/16/2013 15:12  US Renal  06/18/2013   CLINICAL DATA:  An elevated creatinine. History of chronic renal disease  EXAM: RENAL/URINARY TRACT ULTRASOUND COMPLETE  COMPARISON:  DG ABD PORTABLE 1V dated 02/12/2013; US RENAL dated 02/09/2013  FINDINGS: Right Kidney:  Length: 9.1 cm. There is decreased corticomedullary differentiation. A small benign-appearing 0.7 x 0.4 x 0.7 cm cyst is identified within the midpole periphery the right kidney. There is no evidence of solid-appearing masses, hydronephrosis nor calculi.  Left Kidney:  Length: 11.9 cm. There is decreased corticomedullary differentiation. There is no evidence of hydronephrosis, solid or cystic masses, nor calculi.  Bladder:  Urinary bladder is decompressed.  IMPRESSION: Increased cortical echogenicity bilaterally. Cysts consistent with patient's history of chronic renal disease. There is no evidence of obstructive uropathy no calculi. Small benign-appearing cyst is appreciated within the midpole region right kidney.   Electronically Signed   By: [name] M.D.   On: 06/18/2013 18:29   Nm Pulmonary Perf And Vent  06/25/2013   CLINICAL DATA:  Pulmonary hypertension  EXAM: NUCLEAR MEDICINE VENTILATION - PERFUSION LUNG SCAN  TECHNIQUE: Ventilation images were obtained in multiple projections using inhaled aerosol technetium 99 M DTPA. Perfusion images were obtained in multiple projections after intravenous injection of Tc-8m MAA.  COMPARISON:  Chest radiograph 06/24/2013  RADIOPHARMACEUTICALS:  40.0 mCi Tc-26m DTPA aerosol and 6.0 mCi Tc-23m MAA  FINDINGS: Ventilation: Mild peripheral irregularity of ventilation.  Enlargement of cardiac silhouette with diminished ventilation in lingula and left lower lobe.  Mild central airway deposition of tracer.  Perfusion: Enlargement of cardiac silhouette.  Mild diminished perfusion to left lower lobe and lingula, less severe than ventilatory abnormality.  Single small subsegmental perfusion defect in left  lower lobe.  No other perfusion defects identified.  Chest radiograph significant for enlargement of cardiac silhouette post CABG and bibasilar airspace disease.  IMPRESSION: Very low probability for pulmonary embolism.   Electronically Signed   By: Ulyses Southward M.D.   On: 06/25/2013 11:45   US Venous Img Lower Unilateral Left  06/16/2013   CLINICAL DATA:  Soft tissue edema  EXAM: LEFT LOWER EXTREMITY VENOUS DUPLEX ULTRASOUND  TECHNIQUE: Gray-scale sonography with graded compression, as well as color Doppler and duplex ultrasound, were performed to evaluate the deep venous system from the level of the common femoral vein through the popliteal and proximal calf veins. Spectral Doppler was utilized to evaluate flow at rest and with distal augmentation maneuvers.  COMPARISON:  None.  FINDINGS: Flow in the venous structures of the left lower extremity is spontaneous and phasic in all segments. There is normal compression and augmentation in the venous structures of the left lower extremity. Venous Doppler signal is normal in all regions. There is no thrombus in the deep or visualized superficial venous structures on the left. There is no deep venous incompetence. There is soft tissue edema which is causing some diminished visualization of superficial venous structures in the thigh and calf regions.  IMPRESSION: No evidence of left lower extremity deep venous thrombosis. Moderate soft tissue edema.   Electronically Signed   By: Bretta Bang M.D.   On: 06/16/2013 14:30   Dg Chest Port 1 View  06/24/2013   CLINICAL DATA:  Central line placement  EXAM: PORTABLE CHEST - 1 VIEW  COMPARISON:  DG CHEST 2 VIEW dated 06/18/2013  FINDINGS: There is interval placement of a right central venous line with tip in distal SVC. No pneumothorax. There is bilateral mild airspace disease in the lung bases. There is  small effusions.  IMPRESSION: 1. Right central venous line in good position without complication. 2. Increase in  bibasilar airspace disease suggests pulmonary edema.   Electronically Signed   By: Genevive Bi M.D.   On: 06/24/2013 18:59         Subjective: Patient is in good spirits today. Denies any fevers, chills, chest discomfort, nausea, vomiting, diarrhea, vomiting, dysuria. She has some dyspnea on exertion. Overall breathing is better.  Objective: Filed Vitals:   06/25/13 2224 06/26/13 0538 06/26/13 0912 06/26/13 1321  BP:  126/72  123/66  Pulse: 112 113  105  Temp:  98.6 F (37 C)  97.9 F (36.6 C)  TempSrc:  Oral  Oral  Resp: 18 18  18   Height:      Weight:  71.4 kg (157 lb 6.5 oz)    SpO2: 92% 92% 94% 98%    Intake/Output Summary (Last 24 hours) at 06/26/13 1500 Last data filed at 06/26/13 1300  Gross per 24 hour  Intake  795.5 ml  Output   2100 ml  Net -1304.5 ml   Weight change: -5.258 kg (-11 lb 9.5 oz) Exam:   General:  Pt is alert, follows commands appropriately, not in acute distress  HEENT: No icterus, No thrush,  [place]/AT  Cardiovascular: RRR, S1/S2, no rubs, no gallops  Respiratory: Bibasilar crackles. No wheezing. Good air movement.  Abdomen: Soft/+BS, non tender, non distended, no guarding  Extremities: 1+ LE edema, No lymphangitis, No petechiae, No rashes, no synovitis  Data Reviewed: Basic Metabolic Panel:  Recent Labs Lab 06/22/13 1350 06/23/13 0340 06/23/13 0600 06/24/13 0533 06/24/13 1455 06/25/13 0510 06/26/13 0500  NA 139 139 140 137  --  138 137  K 3.9 3.7 3.9 3.5*  --  3.5* 3.9  CL 101 99 100 96  --  92* 90*  CO2 23 24 23 25   --  29 30  GLUCOSE 168* 144* 125* 72  --  99 161*  BUN 65* 69* 69* 79*  --  80* 88*  CREATININE 4.01* 3.90* 3.93* 3.97* 3.86* 3.80* 3.90*  CALCIUM 8.6 9.0 9.0 9.1  --  8.7 9.0  PHOS 6.3* 6.4*  --  6.6*  --  7.1* 8.5*   Liver Function Tests:  Recent Labs Lab 06/22/13 1350 06/23/13 0340 06/24/13 0533 06/25/13 0510 06/26/13 0500  ALBUMIN 2.8* 3.0* 2.8* 2.7* 2.9*   No results found for this  basename: LIPASE, AMYLASE,  in the last 168 hours No results found for this basename: AMMONIA,  in the last 168 hours CBC:  Recent Labs Lab 06/23/13 0600 06/24/13 1455 06/26/13 1200  WBC 6.1 6.2 8.1  HGB 11.5* 10.8* 10.9*  HCT 35.4* 33.1* 32.6*  MCV 85.3 83.6 82.3  PLT 113* 117* 109*   Cardiac Enzymes: No results found for this basename: CKTOTAL, CKMB, CKMBINDEX, TROPONINI,  in the last 168 hours BNP: No components found with this basename: POCBNP,  CBG:  Recent Labs Lab 06/25/13 1147 06/25/13 1644 06/25/13 2128 06/26/13 0616 06/26/13 1227  GLUCAP 87 207* 205* 167* 157*    Recent Results (from the past 240 hour(s))  CULTURE, BLOOD (ROUTINE X 2)     Status: None   Collection Time    06/16/13  4:43 PM      Result Value Ref Range Status   Specimen Description BLOOD ARM LEFT   Final   Special Requests BOTTLES DRAWN AEROBIC AND ANAEROBIC 5CCBLUE 4CCRED   Final   Culture  Setup Time  Final   Value: 06/16/2013 22:10     [facility]   Culture     Final   Value: NO GROWTH 5 DAYS     [facility]formed at [facility]   Report Status 06/22/2013 FINAL   Final  CULTURE, BLOOD (ROUTINE X 2)     Status: None   Collection Time    06/16/13  4:53 PM      Result Value Ref Range Status   Specimen Description BLOOD ARM RIGHT   Final   Special Requests BOTTLES DRAWN AEROBIC AND ANAEROBIC 10CC   Final   Culture  Setup Time     Final   Value: 06/16/2013 22:10     Performed at [facility]   Culture     Final   Value: NO GROWTH 5 DAYS     Performed at [facility]   Report Status 06/22/2013 FINAL   Final  URINE CULTURE     Status: None   Collection Time    06/18/13  6:34 PM      Result Value Ref Range Status   Specimen Description URINE, RANDOM   Final   Special Requests NONE   Final   Culture  Setup Time     Final   Value: 06/18/2013 19:27     Performed at [facility]   Colony Count     Final   Value: >=100,000 COLONIES/ML      Performed at [facility]   Culture     Final   Value: ESCHERICHIA COLI     Performed at [facility]   Report Status 06/20/2013 FINAL   Final   Organism ID, Bacteria ESCHERICHIA COLI   Final     Scheduled Meds: . atorvastatin  80 mg Oral q1800  . budesonide-formoterol  2 puff Inhalation BID  . calcitRIOL  0.25 mcg Oral Daily  . calcium acetate  1,334 mg Oral TID WC  . clopidogrel  75 mg Oral Q breakfast  . doxycycline  100 mg Oral Q12H  . DULoxetine  60 mg Oral Daily  . heparin  5,000 Units Subcutaneous 3 times per day  . insulin aspart  0-9 Units Subcutaneous TID WC  . ketoconazole   Topical BID  . pantoprazole  40 mg Oral Daily  . polyethylene glycol  17 g Oral Daily  . pregabalin  50 mg Oral BID  . senna  2 tablet Oral Daily  . sildenafil  20 mg Oral TID  . sodium chloride  3 mL Intravenous Q12H  . torsemide  40 mg Oral BID  . [START ON 06/29/2013] vancomycin  1,000 mg Intravenous To OR   Continuous Infusions: . milrinone 0.25 mcg/kg/min (06/25/13 2148)     Yamilka Lopiccolo, DO  [facility] Pager [phone]  If 7PM-7AM, please contact night-coverage [email or web address] Password [email or web address] 06/26/2013, 3:00 PM   LOS: 10 days

## 2013-06-27 LAB — CBC
HEMATOCRIT: 32.1 % — AB (ref 36.0–46.0)
HEMOGLOBIN: 10.4 g/dL — AB (ref 12.0–15.0)
MCH: 26.9 pg (ref 26.0–34.0)
MCHC: 32.4 g/dL (ref 30.0–36.0)
MCV: 82.9 fL (ref 78.0–100.0)
Platelets: 102 10*3/uL — ABNORMAL LOW (ref 150–400)
RBC: 3.87 MIL/uL (ref 3.87–5.11)
RDW: 15.5 % (ref 11.5–15.5)
WBC: 7.7 10*3/uL (ref 4.0–10.5)

## 2013-06-27 LAB — GLUCOSE, CAPILLARY
GLUCOSE-CAPILLARY: 302 mg/dL — AB (ref 70–99)
Glucose-Capillary: 167 mg/dL — ABNORMAL HIGH (ref 70–99)
Glucose-Capillary: 222 mg/dL — ABNORMAL HIGH (ref 70–99)
Glucose-Capillary: 96 mg/dL (ref 70–99)

## 2013-06-27 LAB — RENAL FUNCTION PANEL
Albumin: 2.8 g/dL — ABNORMAL LOW (ref 3.5–5.2)
BUN: 93 mg/dL — AB (ref 6–23)
CO2: 31 mEq/L (ref 19–32)
Calcium: 8.8 mg/dL (ref 8.4–10.5)
Chloride: 86 mEq/L — ABNORMAL LOW (ref 96–112)
Creatinine, Ser: 3.89 mg/dL — ABNORMAL HIGH (ref 0.50–1.10)
GFR calc Af Amer: 13 mL/min — ABNORMAL LOW (ref >90)
GFR calc non Af Amer: 11 mL/min — ABNORMAL LOW (ref >90)
GLUCOSE: 185 mg/dL — AB (ref 70–99)
POTASSIUM: 3.6 meq/L — AB (ref 3.7–5.3)
Phosphorus: 8.1 mg/dL — ABNORMAL HIGH (ref 2.3–4.6)
SODIUM: 135 meq/L — AB (ref 137–147)

## 2013-06-27 NOTE — Progress Notes (Signed)
[facility] PROGRESS NOTE  [name]:[id number] DOB: [birth year] DOA: 06/16/2013 PCP: [name], MD  Assessment/Plan: Cellulitis:  - Improving. Complete doxycycline after today's doses UTI  - E coli resistant to FQ- outpatient study. Sensitive to rocephin.  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 Sees Dr. Marisue [name]  -Appreciate nephrology followup  -May need permacath  -cards has patient on IV lasix for further diuresis  -AVF planned for 06/29/13  Acute on Chronic systolic and diastolic congestive heart failure  -EF 15-20%  - furosemide IV per cardiology-->neg 1.7 liters -Switched to oral torsemide 40mg  bid on 06/26/13-->neg 1000cc   -Appreciate Dr. Gala [name] followup  -right heart catheterization--markedly elevated right heart pressures with moderate pulmonary hypertension--PAP=70/37  -V/Q scan--very low probability  -Await UPEP  -remains on milrinone  PAH  -Likely a combination of COPD and left heart failure  -Workup in progress per cardiology  -Await UPEP  -PFTs---FEV1=52% suggests COPD  -V/Q scan--very low probability  -need outpt polysomnography  -sildenafil started 06/24/13  Thrombocytopenia -Concerned about HIT versus medication induced -Send HIT panel PVD (eripheral vascular disease) with claudication:  -ABI shows right WNL and left with moderate disease- can follow up outpatient with vascular  Tinea pedis:  Antifungals topical  Tobacco abuse  -encourage cessation  Venous stasis dermatitis  Hyperlipidemia  -Continue Lipitor  DM2  -HgbA1C- 7.9  -Patient had episodes of hypoglycemia due to poor po intake  -d/c lantus and observe  -Continue NovoLog sliding scale-->CBG 150-180  Family Communication: updated daughter today--total time spent 35 min  Disposition Plan: Home vs  SNF         Procedures/Studies: Dg Chest 2 View  06/18/2013   CLINICAL DATA:  Shortness of breath, history of CHF  EXAM: CHEST  2 VIEW  COMPARISON:  DG CHEST 1V PORT dated 02/11/2013  FINDINGS: Low lung volumes. Cardiac silhouette is enlarged. Patient is status post median sternotomy coronary artery bypass grafting. There is diffuse prominence of the interstitial markings and mild peribronchial cuffing. Areas of increased density projects within the lung bases. No focal regions of consolidation appreciated. There is blunting of the costophrenic angles. The osseous structures unremarkable.  IMPRESSION: Interstitial findings likely representing pulmonary edema. A component of chronic bronchitic changes also a diagnostic consideration. Bibasilar infiltrate versus atelectasis. Surveillance evaluation recommended.   Electronically Signed   By: [name] M.D.   On: 06/18/2013 18:16   Dg Tibia/fibula Left  06/16/2013   CLINICAL DATA:  68 year old female with lower extremity soft tissue injury, abrasions/burning, infected. Initial encounter.  EXAM: LEFT TIBIA AND FIBULA - 2 VIEW  COMPARISON:  Left lower extremity venous ultrasound from the same day reported separately.  FINDINGS: Small medial surgical clips near the level of the knee joint. Evidence of widespread subcutaneous stranding and increased density at the level of the tib-fib. Questionable increased striated pattern of muscles in the calf. No subcutaneous gas. Calcified atherosclerosis. No radiopaque foreign body identified.  No acute fracture or dislocation identified in the tibia or fibula. Bulky chronic degenerative spurring/heterotopic ossification at the tibial tuberosity. No cortical osteolysis identified.  IMPRESSION: 1. Increased soft tissue density compatible with cellulitis in this clinical setting. No subcutaneous gas or retained foreign body identified. 2. No acute osseous abnormality identified. Advanced chronic posttraumatic /  degenerative changes at the tibial tuberosity.   Electronically Signed  By: Augusto GambleLee  Hall M.D.   On: 06/16/2013 15:12   [name]  06/18/2013   CLINICAL DATA:  An elevated creatinine. History of chronic renal disease  EXAM: RENAL/URINARY TRACT ULTRASOUND COMPLETE  COMPARISON:  DG ABD PORTABLE 1V dated 02/12/2013; US RENAL dated 02/09/2013  FINDINGS: Right Kidney:  Length: 9.1 cm. There is decreased corticomedullary differentiation. A small benign-appearing 0.7 x 0.4 x 0.7 cm cyst is identified within the midpole periphery the right kidney. There is no evidence of solid-appearing masses, hydronephrosis nor calculi.  Left Kidney:  Length: 11.9 cm. There is decreased corticomedullary differentiation. There is no evidence of hydronephrosis, solid or cystic masses, nor calculi.  Bladder:  Urinary bladder is decompressed.  IMPRESSION: Increased cortical echogenicity bilaterally. Cysts consistent with patient's history of chronic renal disease. There is no evidence of obstructive uropathy no calculi. Small benign-appearing cyst is appreciated within the midpole region right kidney.   Electronically Signed   By: Heidi HolmesHector  [name] M.D.   On: 06/18/2013 18:29   Nm Pulmonary Perf And Vent  06/25/2013   CLINICAL DATA:  Pulmonary hypertension  EXAM: NUCLEAR MEDICINE VENTILATION - PERFUSION LUNG SCAN  TECHNIQUE: Ventilation images were obtained in multiple projections using inhaled aerosol technetium 99 M DTPA. Perfusion images were obtained in multiple projections after intravenous injection of Tc-6442m MAA.  COMPARISON:  Chest radiograph 06/24/2013  RADIOPHARMACEUTICALS:  40.0 mCi Tc-4642m DTPA aerosol and 6.0 mCi Tc-4442m MAA  FINDINGS: Ventilation: Mild peripheral irregularity of ventilation.  Enlargement of cardiac silhouette with diminished ventilation in lingula and left lower lobe.  Mild central airway deposition of tracer.  Perfusion: Enlargement of cardiac silhouette.  Mild diminished perfusion to left lower lobe and lingula,  less severe than ventilatory abnormality.  Single small subsegmental perfusion defect in left lower lobe.  No other perfusion defects identified.  Chest radiograph significant for enlargement of cardiac silhouette post CABG and bibasilar airspace disease.  IMPRESSION: Very low probability for pulmonary embolism.   Electronically Signed   By: Ulyses SouthwardMark  Boles M.D.   On: 06/25/2013 11:45   [place] Venous Img Lower Unilateral Left  06/16/2013   CLINICAL DATA:  Soft tissue edema  EXAM: LEFT LOWER EXTREMITY VENOUS DUPLEX ULTRASOUND  TECHNIQUE: Gray-scale sonography with graded compression, as well as color Doppler and duplex ultrasound, were performed to evaluate the deep venous system from the level of the common femoral vein through the popliteal and proximal calf veins. Spectral Doppler was utilized to evaluate flow at rest and with distal augmentation maneuvers.  COMPARISON:  None.  FINDINGS: Flow in the venous structures of the left lower extremity is spontaneous and phasic in all segments. There is normal compression and augmentation in the venous structures of the left lower extremity. Venous Doppler signal is normal in all regions. There is no thrombus in the deep or visualized superficial venous structures on the left. There is no deep venous incompetence. There is soft tissue edema which is causing some diminished visualization of superficial venous structures in the thigh and calf regions.  IMPRESSION: No evidence of left lower extremity deep venous thrombosis. Moderate soft tissue edema.   Electronically Signed   By: Bretta BangWilliam  Woodruff M.D.   On: 06/16/2013 14:30   Dg Chest Port 1 View  06/24/2013   CLINICAL DATA:  Central line placement  EXAM: PORTABLE CHEST - 1 VIEW  COMPARISON:  DG CHEST 2 VIEW dated 06/18/2013  FINDINGS: There is interval placement of a right central venous line with tip in distal SVC. No pneumothorax.  There is bilateral mild airspace disease in the lung bases. There is small effusions.   IMPRESSION: 1. Right central venous line in good position without complication. 2. Increase in bibasilar airspace disease suggests pulmonary edema.   Electronically Signed   By: Genevive Bi M.D.   On: 06/24/2013 18:59         Subjective: Patient denies fevers, chills, chest discomfort, shortness breath. However she becomes dyspneic with minimal exertion. No nausea, vomiting, diarrhea, abdominal pain, dysuria.  Objective: Filed Vitals:   06/27/13 0504 06/27/13 0605 06/27/13 0657 06/27/13 0926  BP: 111/64     Pulse: 122     Temp: 98.6 F (37 C)     TempSrc: Oral     Resp: 19     Height:      Weight: 72.031 kg (158 lb 12.8 oz) 71.079 kg (156 lb 11.2 oz)    SpO2: 85%  94% 96%    Intake/Output Summary (Last 24 hours) at 06/27/13 0959 Last data filed at 06/26/13 1957  Gross per 24 hour  Intake    480 ml  Output   1000 ml  Net   -520 ml   Weight change: 0.631 kg (1 lb 6.3 oz) Exam:   General:  Pt is alert, follows commands appropriately, not in acute distress  HEENT: No icterus, No thrush,  /AT  Cardiovascular: RRR, S1/S2, no rubs, no gallops  Respiratory: Bibasilar crackles. No wheezing. Good air movement.  Abdomen: Soft/+BS, non tender, non distended, no guarding  Extremities: 1+ LE edema, No lymphangitis, No petechiae, No rashes, no synovitis  Data Reviewed: Basic Metabolic Panel:  Recent Labs Lab 06/23/13 0340 06/23/13 0600 06/24/13 0533 06/24/13 1455 06/25/13 0510 06/26/13 0500 06/27/13 0438  NA 139 140 137  --  138 137 135*  K 3.7 3.9 3.5*  --  3.5* 3.9 3.6*  CL 99 100 96  --  92* 90* 86*  CO2 24 23 25   --  29 30 31   GLUCOSE 144* 125* 72  --  99 161* 185*  BUN 69* 69* 79*  --  80* 88* 93*  CREATININE 3.90* 3.93* 3.97* 3.86* 3.80* 3.90* 3.89*  CALCIUM 9.0 9.0 9.1  --  8.7 9.0 8.8  PHOS 6.4*  --  6.6*  --  7.1* 8.5* 8.1*   Liver Function Tests:  Recent Labs Lab 06/23/13 0340 06/24/13 0533 06/25/13 0510 06/26/13 0500 06/27/13 0438   ALBUMIN 3.0* 2.8* 2.7* 2.9* 2.8*   No results found for this basename: LIPASE, AMYLASE,  in the last 168 hours No results found for this basename: AMMONIA,  in the last 168 hours CBC:  Recent Labs Lab 06/23/13 0600 06/24/13 1455 06/26/13 1200 06/27/13 0438  WBC 6.1 6.2 8.1 7.7  HGB 11.5* 10.8* 10.9* 10.4*  HCT 35.4* 33.1* 32.6* 32.1*  MCV 85.3 83.6 82.3 82.9  PLT 113* 117* 109* 102*   Cardiac Enzymes: No results found for this basename: CKTOTAL, CKMB, CKMBINDEX, TROPONINI,  in the last 168 hours BNP: No components found with this basename: POCBNP,  CBG:  Recent Labs Lab 06/26/13 0616 06/26/13 1227 06/26/13 1711 06/26/13 2133 06/27/13 0628  GLUCAP 167* 157* 180* 146* 167*    Recent Results (from the past 240 hour(s))  URINE CULTURE     Status: None   Collection Time    06/18/13  6:34 PM      Result Value Ref Range Status   Specimen Description URINE, RANDOM   Final   Special Requests NONE  Final   Culture  Setup Time     Final   Value: 06/18/2013 19:27     Performed at [facility] Count     Final   Value: >=100,000 COLONIES/ML     Performed at [facility]   Culture     Final   Value: ESCHERICHIA COLI     Performed at [facility]   Report Status 06/20/2013 FINAL   Final   Organism ID, Bacteria ESCHERICHIA COLI   Final     Scheduled Meds: . atorvastatin  80 mg Oral q1800  . budesonide-formoterol  2 puff Inhalation BID  . calcitRIOL  0.25 mcg Oral Daily  . calcium acetate  1,334 mg Oral TID WC  . clopidogrel  75 mg Oral Q breakfast  . doxycycline  100 mg Oral Q12H  . DULoxetine  60 mg Oral Daily  . heparin  5,000 Units Subcutaneous 3 times per day  . insulin aspart  0-9 Units Subcutaneous TID WC  . ketoconazole   Topical BID  . pantoprazole  40 mg Oral Daily  . polyethylene glycol  17 g Oral Daily  . pregabalin  50 mg Oral BID  . senna  2 tablet Oral Daily  . sildenafil  20 mg Oral TID  . sodium chloride  3 mL  Intravenous Q12H  . torsemide  40 mg Oral BID  . [START ON 06/29/2013] vancomycin  1,000 mg Intravenous To OR   Continuous Infusions: . milrinone 0.25 mcg/kg/min (06/27/13 0747)     Persephone Schriever, DO  [facility] Pager [phone]  If 7PM-7AM, please contact night-coverage [email or web address] Password [email or web address] 06/27/2013, 9:59 AM   LOS: 11 days

## 2013-06-27 NOTE — Progress Notes (Signed)
SUBJECTIVE:  Still SOB some  OBJECTIVE:   Vitals:   Filed Vitals:   06/27/13 0504 06/27/13 0605 06/27/13 0657 06/27/13 0926  BP: 111/64     Pulse: 122     Temp: 98.6 F (37 C)     TempSrc: Oral     Resp: 19     Height:      Weight: 158 lb 12.8 oz (72.031 kg) 156 lb 11.2 oz (71.079 kg)    SpO2: 85%  94% 96%   I&O's:   Intake/Output Summary (Last 24 hours) at 06/27/13 1014 Last data filed at 06/27/13 0900  Gross per 24 hour  Intake    720 ml  Output   1000 ml  Net   -280 ml   TELEMETRY: Reviewed telemetry pt in NSR with frequent PAC's:     PHYSICAL EXAM General: Well developed, well nourished, in no acute distress Head: Eyes PERRLA, No xanthomas.   Normal cephalic and atramatic  Lungs:   Crackles about 1/3 way up bilaterally Heart:   HRRR S1 S2 Pulses are 2+ & equal. Abdomen: Bowel sounds are positive, abdomen soft and non-tender without masses Extremities:   1+ edema bilaterally Neuro: Alert and oriented X 3. Psych:  Good affect, responds appropriately   LABS: Basic Metabolic Panel:  Recent Labs  40/98/1102/20/15 0500 06/27/13 0438  NA 137 135*  K 3.9 3.6*  CL 90* 86*  CO2 30 31  GLUCOSE 161* 185*  BUN 88* 93*  CREATININE 3.90* 3.89*  CALCIUM 9.0 8.8  PHOS 8.5* 8.1*   Liver Function Tests:  Recent Labs  06/26/13 0500 06/27/13 0438  ALBUMIN 2.9* 2.8*   No results found for this basename: LIPASE, AMYLASE,  in the last 72 hours CBC:  Recent Labs  06/26/13 1200 06/27/13 0438  WBC 8.1 7.7  HGB 10.9* 10.4*  HCT 32.6* 32.1*  MCV 82.3 82.9  PLT 109* 102*    Coag Panel:   Lab Results  Component Value Date   INR 1.11 06/25/2013   INR 1.17 06/23/2013   INR 1.09 01/13/2013    RADIOLOGY: Dg Chest 2 View  06/18/2013   CLINICAL DATA:  Shortness of breath, history of CHF  EXAM: CHEST  2 VIEW  COMPARISON:  DG CHEST 1V PORT dated 02/11/2013  FINDINGS: Low lung volumes. Cardiac silhouette is enlarged. Patient is status post median sternotomy coronary artery  bypass grafting. There is diffuse prominence of the interstitial markings and mild peribronchial cuffing. Areas of increased density projects within the lung bases. No focal regions of consolidation appreciated. There is blunting of the costophrenic angles. The osseous structures unremarkable.  IMPRESSION: Interstitial findings likely representing pulmonary edema. A component of chronic bronchitic changes also a diagnostic consideration. Bibasilar infiltrate versus atelectasis. Surveillance evaluation recommended.   Electronically Signed   By: Salome HolmesHector  Cooper M.D.   On: 06/18/2013 18:16   Dg Tibia/fibula Left  06/16/2013   CLINICAL DATA:  68 year old female with lower extremity soft tissue injury, abrasions/burning, infected. Initial encounter.  EXAM: LEFT TIBIA AND FIBULA - 2 VIEW  COMPARISON:  Left lower extremity venous ultrasound from the same day reported separately.  FINDINGS: Small medial surgical clips near the level of the knee joint. Evidence of widespread subcutaneous stranding and increased density at the level of the tib-fib. Questionable increased striated pattern of muscles in the calf. No subcutaneous gas. Calcified atherosclerosis. No radiopaque foreign body identified.  No acute fracture or dislocation identified in the tibia or fibula. Bulky chronic degenerative spurring/heterotopic ossification  at the tibial tuberosity. No cortical osteolysis identified.  IMPRESSION: 1. Increased soft tissue density compatible with cellulitis in this clinical setting. No subcutaneous gas or retained foreign body identified. 2. No acute osseous abnormality identified. Advanced chronic posttraumatic / degenerative changes at the tibial tuberosity.   Electronically Signed   By: Augusto Gamble M.D.   On: 06/16/2013 15:12   US Renal  06/18/2013   CLINICAL DATA:  An elevated creatinine. History of chronic renal disease  EXAM: RENAL/URINARY TRACT ULTRASOUND COMPLETE  COMPARISON:  DG ABD PORTABLE 1V dated 02/12/2013; US  RENAL dated 02/09/2013  FINDINGS: Right Kidney:  Length: 9.1 cm. There is decreased corticomedullary differentiation. A small benign-appearing 0.7 x 0.4 x 0.7 cm cyst is identified within the midpole periphery the right kidney. There is no evidence of solid-appearing masses, hydronephrosis nor calculi.  Left Kidney:  Length: 11.9 cm. There is decreased corticomedullary differentiation. There is no evidence of hydronephrosis, solid or cystic masses, nor calculi.  Bladder:  Urinary bladder is decompressed.  IMPRESSION: Increased cortical echogenicity bilaterally. Cysts consistent with patient's history of chronic renal disease. There is no evidence of obstructive uropathy no calculi. Small benign-appearing cyst is appreciated within the midpole region right kidney.   Electronically Signed   By: [name] M.D.   On: 06/18/2013 18:29   Nm Pulmonary Perf And Vent  06/25/2013   CLINICAL DATA:  Pulmonary hypertension  EXAM: NUCLEAR MEDICINE VENTILATION - PERFUSION LUNG SCAN  TECHNIQUE: Ventilation images were obtained in multiple projections using inhaled aerosol technetium 99 M DTPA. Perfusion images were obtained in multiple projections after intravenous injection of Tc-9m MAA.  COMPARISON:  Chest radiograph 06/24/2013  RADIOPHARMACEUTICALS:  40.0 mCi Tc-58m DTPA aerosol and 6.0 mCi Tc-26m MAA  FINDINGS: Ventilation: Mild peripheral irregularity of ventilation.  Enlargement of cardiac silhouette with diminished ventilation in lingula and left lower lobe.  Mild central airway deposition of tracer.  Perfusion: Enlargement of cardiac silhouette.  Mild diminished perfusion to left lower lobe and lingula, less severe than ventilatory abnormality.  Single small subsegmental perfusion defect in left lower lobe.  No other perfusion defects identified.  Chest radiograph significant for enlargement of cardiac silhouette post CABG and bibasilar airspace disease.  IMPRESSION: Very low probability for pulmonary embolism.    Electronically Signed   By: Ulyses Southward M.D.   On: 06/25/2013 11:45   US Venous Img Lower Unilateral Left  06/16/2013   CLINICAL DATA:  Soft tissue edema  EXAM: LEFT LOWER EXTREMITY VENOUS DUPLEX ULTRASOUND  TECHNIQUE: Gray-scale sonography with graded compression, as well as color Doppler and duplex ultrasound, were performed to evaluate the deep venous system from the level of the common femoral vein through the popliteal and proximal calf veins. Spectral Doppler was utilized to evaluate flow at rest and with distal augmentation maneuvers.  COMPARISON:  None.  FINDINGS: Flow in the venous structures of the left lower extremity is spontaneous and phasic in all segments. There is normal compression and augmentation in the venous structures of the left lower extremity. Venous Doppler signal is normal in all regions. There is no thrombus in the deep or visualized superficial venous structures on the left. There is no deep venous incompetence. There is soft tissue edema which is causing some diminished visualization of superficial venous structures in the thigh and calf regions.  IMPRESSION: No evidence of left lower extremity deep venous thrombosis. Moderate soft tissue edema.   Electronically Signed   By: Bretta Bang M.D.   On: 06/16/2013  14:30   Dg Chest Port 1 View  06/24/2013   CLINICAL DATA:  Central line placement  EXAM: PORTABLE CHEST - 1 VIEW  COMPARISON:  DG CHEST 2 VIEW dated 06/18/2013  FINDINGS: There is interval placement of a right central venous line with tip in distal SVC. No pneumothorax. There is bilateral mild airspace disease in the lung bases. There is small effusions.  IMPRESSION: 1. Right central venous line in good position without complication. 2. Increase in bibasilar airspace disease suggests pulmonary edema.   Electronically Signed   By: Genevive Bi M.D.   On: 06/24/2013 18:59   Assessment:   1) A/C systolic HF  - EF 15-20% (2013)  - PFTs with restrictive/obstructive  physiology - Taken to cath lab 2/18 for RHC which showed low CO, mod PAH with elevated PVR and equalization of RV and LV filling pressures. She was started on milrinone and revatio 20 mg TID. IV Lasix stopped due to increasing creatinine and was started on PO demadex.  Creatinine stable this am. She diuresed about 1L yesterday and is slightly net negative at this point.  Weights seem to fluctuate since admit but has lost at least 15 lbs if weights are accurate. 2) Acute on chronic renal failure, stage IV  3) DM2  4) HTN  5) PVD  - R fem-pop (12/2012)  6) CAD  - s/p CABG 2007  Plan/Discussion:   Pending SPEP/UPEP. Will need sleep study on outpatient side.  Continue milrinone currently. Will continue sildenafil cautiously and watch for shunting. Suspect we will have to stop when HD initiated due to BP issues. Going for AV fistula Monday.  Continue PO Demadex Replete potassium to keep around 4  Quintella Reichert, MD  06/27/2013  10:14 AM

## 2013-06-27 NOTE — Progress Notes (Signed)
Patient ID: [name], female   DOB: 1945-10-09, 68 y.o.   MRN: [id number] S:feels good, no c/o O:BP 111/64  Pulse 122  Temp(Src) 98.6 F (37 C) (Oral)  Resp 19  Ht 5' (1.524 m)  Wt 71.079 kg (156 lb 11.2 oz)  BMI 30.60 kg/m2  SpO2 96%  Intake/Output Summary (Last 24 hours) at 06/27/13 1323 Last data filed at 06/27/13 0900  Gross per 24 hour  Intake    480 ml  Output   1000 ml  Net   -520 ml   Intake/Output: I/O last 3 completed shifts: In: 795.5 [P.O.:600; I.V.:195.5] Out: 2300 [Urine:2300]  Intake/Output this shift:  Total I/O In: 240 [P.O.:240] Out: -  Weight change: 0.631 kg (1 lb 6.3 oz) Gen: Nad CVS:no rub Resp:bibasilar crackles Abd:+BS, soft,NT Ext:1+pretib edema   Recent Labs Lab 06/21/13 0610 06/22/13 1350 06/23/13 0340 06/23/13 0600 06/24/13 0533 06/24/13 1455 06/25/13 0510 06/26/13 0500 06/27/13 0438  NA 141 139 139 140 137  --  138 137 135*  K 4.1 3.9 3.7 3.9 3.5*  --  3.5* 3.9 3.6*  CL 105 101 99 100 96  --  92* 90* 86*  CO2 20 23 24 23 25   --  29 30 31   GLUCOSE 96 168* 144* 125* 72  --  99 161* 185*  BUN 57* 65* 69* 69* 79*  --  80* 88* 93*  CREATININE 4.03* 4.01* 3.90* 3.93* 3.97* 3.86* 3.80* 3.90* 3.89*  ALBUMIN 2.8* 2.8* 3.0*  --  2.8*  --  2.7* 2.9* 2.8*  CALCIUM 8.6 8.6 9.0 9.0 9.1  --  8.7 9.0 8.8  PHOS 6.7* 6.3* 6.4*  --  6.6*  --  7.1* 8.5* 8.1*   Liver Function Tests:  Recent Labs Lab 06/25/13 0510 06/26/13 0500 06/27/13 0438  ALBUMIN 2.7* 2.9* 2.8*   No results found for this basename: LIPASE, AMYLASE,  in the last 168 hours No results found for this basename: AMMONIA,  in the last 168 hours CBC:  Recent Labs Lab 06/23/13 0600 06/24/13 1455 06/26/13 1200 06/27/13 0438  WBC 6.1 6.2 8.1 7.7  HGB 11.5* 10.8* 10.9* 10.4*  HCT 35.4* 33.1* 32.6* 32.1*  MCV 85.3 83.6 82.3 82.9  PLT 113* 117* 109* 102*   Cardiac Enzymes: No results found for this basename: CKTOTAL, CKMB, CKMBINDEX, TROPONINI,  in the last 168  hours CBG:  Recent Labs Lab 06/26/13 1227 06/26/13 1711 06/26/13 2133 06/27/13 0628 06/27/13 1116  GLUCAP 157* 180* 146* 167* 222*    Iron Studies: No results found for this basename: IRON, TIBC, TRANSFERRIN, FERRITIN,  in the last 72 hours Studies/Results: No results found. Marland Kitchen. atorvastatin  80 mg Oral q1800  . budesonide-formoterol  2 puff Inhalation BID  . calcitRIOL  0.25 mcg Oral Daily  . calcium acetate  1,334 mg Oral TID WC  . clopidogrel  75 mg Oral Q breakfast  . doxycycline  100 mg Oral Q12H  . DULoxetine  60 mg Oral Daily  . heparin  5,000 Units Subcutaneous 3 times per day  . insulin aspart  0-9 Units Subcutaneous TID WC  . ketoconazole   Topical BID  . pantoprazole  40 mg Oral Daily  . polyethylene glycol  17 g Oral Daily  . pregabalin  50 mg Oral BID  . senna  2 tablet Oral Daily  . sildenafil  20 mg Oral TID  . sodium chloride  3 mL Intravenous Q12H  . torsemide  40 mg Oral BID  . [  START ON 06/29/2013] vancomycin  1,000 mg Intravenous To OR    BMET    Component Value Date/Time   NA 135* 06/27/2013 0438   K 3.6* 06/27/2013 0438   CL 86* 06/27/2013 0438   CO2 31 06/27/2013 0438   GLUCOSE 185* 06/27/2013 0438   BUN 93* 06/27/2013 0438   CREATININE 3.89* 06/27/2013 0438   CALCIUM 8.8 06/27/2013 0438   GFRNONAA 11* 06/27/2013 0438   GFRAA 13* 06/27/2013 0438   CBC    Component Value Date/Time   WBC 7.7 06/27/2013 0438   WBC 9.2 12/19/2012 1601   RBC 3.87 06/27/2013 0438   RBC 4.66 12/19/2012 1601   HGB 10.4* 06/27/2013 0438   HGB 13.7 12/19/2012 1601   HCT 32.1* 06/27/2013 0438   HCT 42.9 12/19/2012 1601   PLT 102* 06/27/2013 0438   MCV 82.9 06/27/2013 0438   MCV 92.0 12/19/2012 1601   MCH 26.9 06/27/2013 0438   MCH 29.4 12/19/2012 1601   MCHC 32.4 06/27/2013 0438   MCHC 31.9 12/19/2012 1601   RDW 15.5 06/27/2013 0438   LYMPHSABS 1.6 06/16/2013 1915   MONOABS 0.4 06/16/2013 1915   EOSABS 0.2 06/16/2013 1915   BASOSABS 0.0 06/16/2013 1915     Assessment/Plan:   1. CHF- acute on chronic systolic now s/p RHC c/w restrictive CMP. Agree with plan for milrinone to max cardiac output and hopefully improve cardiorenal syndrome.  1. Diuresed only over the last 24 hours without change in Scr 2. Cont with current plan per HF team 3. Hyponatremia, increasing BUN, would recommend decreasing diuretics if ok with Cards as she is likely intravascularly getting to point of worsening renal hemodynamics.   2. 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  1. Appreciate HF team's input. Diuresing and Scr stable but above baseline.  2. Pt wants to hold off on HD for now. No urgent indication for HD and will cont to follow after trial of diuresis and inotropes but is now willing to have permanent access placed during this hospitalization.  3. Ordered vein mapping VVS consult for placement of AVF on Monday thanks to Dr. Darrick Penna.  2. Pulm HTN - work up underway 3. Hypokalemia- replete and follow 4. ACDz- stable 5. SHPTH: on vit D and binders 6. UTI- on ceftin and doxy (cellulitis resolved) 7. HTN- stable 8. DM- per primary svc 9. Vascular access- for LUE AVF 06/29/13 per Dr. Sondra Barges and may need tunneled HD cath if renal function deteriorates 10.   [name] A

## 2013-06-27 NOTE — Progress Notes (Signed)
Pt has been very lethargic and tired the past two days.  She falls asleep while eating her meals or talking on the phone often.  She has difficultly sitting upright for meals and will continuously fall back, laying on the bed.  She has also been progressively more forgetful about factors regarding her care.  She often loses her train of though mid-sentence, or forgets what medical staff have told her just a few minutes prior.  Will continue to monitor.

## 2013-06-27 NOTE — Progress Notes (Signed)
CARDIAC REHAB PHASE I   PRE:  Rate/Rhythm: 115 ST  BP:  Sitting: 124/60     SaO2: 98 3L  MODE:  Ambulation: 40 ft   POST:  Rate/Rhythm: 128 ST  BP:  Sitting: 136/80    SaO2: 94 3L  Pt walked 40 ft with RW, gait belt and assist x2.  Pt very weak and very unstable.  Pt was encouraged to stand up while walking, but proceeded to bend over during ambulation.  Pt c/o of back and knee pain during ambulation.  Unable to do education due to poor cognition and lack of cooperation at the time.  PT would be benefical.  Returned pt to bed with bedside table, call button and phone in reach. [id number]  Marvene Staff MS, ACSM RCEP 12:10 PM 06/27/2013

## 2013-06-28 ENCOUNTER — Inpatient Hospital Stay (HOSPITAL_COMMUNITY): Payer: Medicare Other

## 2013-06-28 LAB — GLUCOSE, CAPILLARY
Glucose-Capillary: 171 mg/dL — ABNORMAL HIGH (ref 70–99)
Glucose-Capillary: 251 mg/dL — ABNORMAL HIGH (ref 70–99)
Glucose-Capillary: 142 mg/dL — ABNORMAL HIGH (ref 70–99)
Glucose-Capillary: 191 mg/dL — ABNORMAL HIGH (ref 70–99)

## 2013-06-28 LAB — CARBOXYHEMOGLOBIN
CARBOXYHEMOGLOBIN: 1.8 % — AB (ref 0.5–1.5)
METHEMOGLOBIN: 0.6 % (ref 0.0–1.5)
O2 Saturation: 64.5 %
Total hemoglobin: 10.5 g/dL — ABNORMAL LOW (ref 12.0–16.0)

## 2013-06-28 LAB — RENAL FUNCTION PANEL
Albumin: 2.7 g/dL — ABNORMAL LOW (ref 3.5–5.2)
BUN: 97 mg/dL — AB (ref 6–23)
CO2: 31 meq/L (ref 19–32)
CREATININE: 4.07 mg/dL — AB (ref 0.50–1.10)
Calcium: 8.9 mg/dL (ref 8.4–10.5)
Chloride: 85 mEq/L — ABNORMAL LOW (ref 96–112)
GFR calc Af Amer: 12 mL/min — ABNORMAL LOW (ref >90)
GFR calc non Af Amer: 10 mL/min — ABNORMAL LOW (ref >90)
Glucose, Bld: 163 mg/dL — ABNORMAL HIGH (ref 70–99)
Phosphorus: 8.5 mg/dL — ABNORMAL HIGH (ref 2.3–4.6)
Potassium: 3.3 mEq/L — ABNORMAL LOW (ref 3.7–5.3)
Sodium: 134 mEq/L — ABNORMAL LOW (ref 137–147)

## 2013-06-28 MED ORDER — POTASSIUM CHLORIDE CRYS ER 20 MEQ PO TBCR
40.0000 meq | EXTENDED_RELEASE_TABLET | Freq: Once | ORAL | Status: AC
  Administered 2013-06-28: 40 meq via ORAL
  Filled 2013-06-28: qty 2

## 2013-06-28 NOTE — Progress Notes (Addendum)
SUBJECTIVE:  Very tired  OBJECTIVE:   Vitals:   Filed Vitals:   06/27/13 1424 06/27/13 1958 06/27/13 2024 06/28/13 0425  BP: 119/67 104/59  127/68  Pulse: 117 123 110 126  Temp: 98.7 F (37.1 C) 98.8 F (37.1 C)  99.5 F (37.5 C)  TempSrc: Oral Oral  Oral  Resp: 18 19 18 20   Height:      Weight:    157 lb 12.8 oz (71.578 kg)  SpO2: 97% 91% 91% 90%   I&O's:   Intake/Output Summary (Last 24 hours) at 06/28/13 [id number] Last data filed at 06/28/13 0900  Gross per 24 hour  Intake 1389.73 ml  Output   1325 ml  Net  64.73 ml   TELEMETRY: Reviewed telemetry pt in sinus tachycardia at 120bpm:     PHYSICAL EXAM General: Well developed, well nourished, in no acute distress Head: Eyes PERRLA, No xanthomas.   Normal cephalic and atramatic  Lungs:   Diffuse crackles 1/2 way up bilaterally Heart:   HRRR S1 S2 Pulses are 2+ & equal. tachy Abdomen: Bowel sounds are positive, abdomen soft and non-tender without masses Extremities:   No clubbing, cyanosis or edema.  DP +1 Neuro: Alert and oriented X 3. Psych:  Good affect, responds appropriately   LABS: Basic Metabolic Panel:  Recent Labs  96/08/5400/21/15 0438 06/28/13 0600  NA 135* 134*  K 3.6* 3.3*  CL 86* 85*  CO2 31 31  GLUCOSE 185* 163*  BUN 93* 97*  CREATININE 3.89* 4.07*  CALCIUM 8.8 8.9  PHOS 8.1* 8.5*   Liver Function Tests:  Recent Labs  06/27/13 0438 06/28/13 0600  ALBUMIN 2.8* 2.7*   No results found for this basename: LIPASE, AMYLASE,  in the last 72 hours CBC:  Recent Labs  06/26/13 1200 06/27/13 0438  WBC 8.1 7.7  HGB 10.9* 10.4*  HCT 32.6* 32.1*  MCV 82.3 82.9  PLT 109* 102*   Cardiac Enzymes: No results found for this basename: CKTOTAL, CKMB, CKMBINDEX, TROPONINI,  in the last 72 hours BNP: No components found with this basename: POCBNP,  D-Dimer: No results found for this basename: DDIMER,  in the last 72 hours Hemoglobin A1C: No results found for this basename: HGBA1C,  in the last 72  hours Fasting Lipid Panel: No results found for this basename: CHOL, HDL, LDLCALC, TRIG, CHOLHDL, LDLDIRECT,  in the last 72 hours Thyroid Function Tests: No results found for this basename: TSH, T4TOTAL, FREET3, T3FREE, THYROIDAB,  in the last 72 hours Anemia Panel: No results found for this basename: VITAMINB12, FOLATE, FERRITIN, TIBC, IRON, RETICCTPCT,  in the last 72 hours Coag Panel:   Lab Results  Component Value Date   INR 1.11 06/25/2013   INR 1.17 06/23/2013   INR 1.09 01/13/2013    RADIOLOGY: Dg Chest 2 View  06/18/2013   CLINICAL DATA:  Shortness of breath, history of CHF  EXAM: CHEST  2 VIEW  COMPARISON:  DG CHEST 1V PORT dated 02/11/2013  FINDINGS: Low lung volumes. Cardiac silhouette is enlarged. Patient is status post median sternotomy coronary artery bypass grafting. There is diffuse prominence of the interstitial markings and mild peribronchial cuffing. Areas of increased density projects within the lung bases. No focal regions of consolidation appreciated. There is blunting of the costophrenic angles. The osseous structures unremarkable.  IMPRESSION: Interstitial findings likely representing pulmonary edema. A component of chronic bronchitic changes also a diagnostic consideration. Bibasilar infiltrate versus atelectasis. Surveillance evaluation recommended.   Electronically Signed   By: Oswaldo DoneHector  Excell Seltzer M.D.   On: 06/18/2013 18:16   Dg Tibia/fibula Left  06/16/2013   CLINICAL DATA:  68 year old female with lower extremity soft tissue injury, abrasions/burning, infected. Initial encounter.  EXAM: LEFT TIBIA AND FIBULA - 2 VIEW  COMPARISON:  Left lower extremity venous ultrasound from the same day reported separately.  FINDINGS: Small medial surgical clips near the level of the knee joint. Evidence of widespread subcutaneous stranding and increased density at the level of the tib-fib. Questionable increased striated pattern of muscles in the calf. No subcutaneous gas. Calcified  atherosclerosis. No radiopaque foreign body identified.  No acute fracture or dislocation identified in the tibia or fibula. Bulky chronic degenerative spurring/heterotopic ossification at the tibial tuberosity. No cortical osteolysis identified.  IMPRESSION: 1. Increased soft tissue density compatible with cellulitis in this clinical setting. No subcutaneous gas or retained foreign body identified. 2. No acute osseous abnormality identified. Advanced chronic posttraumatic / degenerative changes at the tibial tuberosity.   Electronically Signed   By: Augusto Gamble M.D.   On: 06/16/2013 15:12   US Renal  06/18/2013   CLINICAL DATA:  An elevated creatinine. History of chronic renal disease  EXAM: RENAL/URINARY TRACT ULTRASOUND COMPLETE  COMPARISON:  DG ABD PORTABLE 1V dated 02/12/2013; US RENAL dated 02/09/2013  FINDINGS: Right Kidney:  Length: 9.1 cm. There is decreased corticomedullary differentiation. A small benign-appearing 0.7 x 0.4 x 0.7 cm cyst is identified within the midpole periphery the right kidney. There is no evidence of solid-appearing masses, hydronephrosis nor calculi.  Left Kidney:  Length: 11.9 cm. There is decreased corticomedullary differentiation. There is no evidence of hydronephrosis, solid or cystic masses, nor calculi.  Bladder:  Urinary bladder is decompressed.  IMPRESSION: Increased cortical echogenicity bilaterally. Cysts consistent with patient's history of chronic renal disease. There is no evidence of obstructive uropathy no calculi. Small benign-appearing cyst is appreciated within the midpole region right kidney.   Electronically Signed   By: [name] M.D.   On: 06/18/2013 18:29   Nm Pulmonary Perf And Vent  06/25/2013   CLINICAL DATA:  Pulmonary hypertension  EXAM: NUCLEAR MEDICINE VENTILATION - PERFUSION LUNG SCAN  TECHNIQUE: Ventilation images were obtained in multiple projections using inhaled aerosol technetium 99 M DTPA. Perfusion images were obtained in multiple  projections after intravenous injection of Tc-38m MAA.  COMPARISON:  Chest radiograph 06/24/2013  RADIOPHARMACEUTICALS:  40.0 mCi Tc-25m DTPA aerosol and 6.0 mCi Tc-66m MAA  FINDINGS: Ventilation: Mild peripheral irregularity of ventilation.  Enlargement of cardiac silhouette with diminished ventilation in lingula and left lower lobe.  Mild central airway deposition of tracer.  Perfusion: Enlargement of cardiac silhouette.  Mild diminished perfusion to left lower lobe and lingula, less severe than ventilatory abnormality.  Single small subsegmental perfusion defect in left lower lobe.  No other perfusion defects identified.  Chest radiograph significant for enlargement of cardiac silhouette post CABG and bibasilar airspace disease.  IMPRESSION: Very low probability for pulmonary embolism.   Electronically Signed   By: Ulyses Southward M.D.   On: 06/25/2013 11:45   US Venous Img Lower Unilateral Left  06/16/2013   CLINICAL DATA:  Soft tissue edema  EXAM: LEFT LOWER EXTREMITY VENOUS DUPLEX ULTRASOUND  TECHNIQUE: Gray-scale sonography with graded compression, as well as color Doppler and duplex ultrasound, were performed to evaluate the deep venous system from the level of the common femoral vein through the popliteal and proximal calf veins. Spectral Doppler was utilized to evaluate flow at rest and with distal augmentation maneuvers.  COMPARISON:  None.  FINDINGS: Flow in the venous structures of the left lower extremity is spontaneous and phasic in all segments. There is normal compression and augmentation in the venous structures of the left lower extremity. Venous Doppler signal is normal in all regions. There is no thrombus in the deep or visualized superficial venous structures on the left. There is no deep venous incompetence. There is soft tissue edema which is causing some diminished visualization of superficial venous structures in the thigh and calf regions.  IMPRESSION: No evidence of left lower extremity  deep venous thrombosis. Moderate soft tissue edema.   Electronically Signed   By: Bretta Bang M.D.   On: 06/16/2013 14:30   Dg Chest Port 1 View  06/24/2013   CLINICAL DATA:  Central line placement  EXAM: PORTABLE CHEST - 1 VIEW  COMPARISON:  DG CHEST 2 VIEW dated 06/18/2013  FINDINGS: There is interval placement of a right central venous line with tip in distal SVC. No pneumothorax. There is bilateral mild airspace disease in the lung bases. There is small effusions.  IMPRESSION: 1. Right central venous line in good position without complication. 2. Increase in bibasilar airspace disease suggests pulmonary edema.   Electronically Signed   By: Genevive Bi M.D.   On: 06/24/2013 18:59    Assessment:   1) A/C systolic HF  - EF 15-20% (2013)  - PFTs with restrictive/obstructive physiology  - Taken to cath lab 2/18 for RHC which showed low CO, mod PAH with elevated PVR and equalization of RV and LV filling pressures. She was started on milrinone and revatio 20 mg TID. IV Lasix stopped due to increasing creatinine and was started on PO demadex. Creatinine stable this am. She diuresed about 1L yesterday and is slightly net negative at this point. Weights seem to fluctuate since admit but has lost at least 15 lbs if weights are accurate.  2) Acute on chronic renal failure, stage IV  3) DM2  4) HTN  5) PVD  - R fem-pop (12/2012)  6) CAD  - s/p CABG 2007  7) Hypokalemia 8) Sinus tachycardia - this has been progressive over the past 4 days and now her resting HR is 120's while lying in bed Plan/Discussion:   Pending SPEP/UPEP.  Will need sleep study on outpatient side.  Continue milrinone currently.  Will continue sildenafil cautiously and watch for shunting. Suspect we will have to stop when HD initiated due to BP issues. Going for AV fistula Monday.  Continue PO Demadex  Replete potassium to keep around 4 Sinus tachycardia may be due to Milrinone.  Will check co ox    Quintella Reichert,  MD  06/28/2013  9:42 AM

## 2013-06-28 NOTE — Progress Notes (Signed)
[facility] PROGRESS NOTE  [name] VWU:[id number] DOB: [birth year] DOA: 06/16/2013 PCP: [name], MD  Assessment/Plan: Acute on chronic renal failure (CKD4)  -Likely due to acute infection and a degree of cardiorenal syndrome  - Creatinine last year was ~2 in October 2014 -Appreciate nephrology followup  -May need permacath  -AVF planned for 06/29/13  Acute on Chronic systolic and diastolic congestive heart failure  -EF 15-20%  -Switched to oral torsemide 40mg  bid on 06/26/13-->neg 1000cc  -neg 800 cc 06/27/13 -Appreciate Dr. Gala Romney followup  -right heart catheterization--markedly elevated right heart pressures with moderate pulmonary hypertension--PAP=70/37  -V/Q scan--very low probability  -Await UPEP  -remains on milrinone  PAH  -Likely a combination of COPD and left heart failure  -Workup in progress per cardiology  -Await UPEP  -PFTs---FEV1=52% suggests COPD  -V/Q scan--very low probability  -need outpt polysomnography  -sildenafil started 06/24/13  Intermitten confusion -multifactorial including but not limited to the patient's acute on chronic renal failure, decompensated CHF, metabolic arrangements -UA and urine culture -repeat CXR Thrombocytopenia  -Concerned about HIT versus medication induced  -Send HIT panel  PVD (eripheral vascular disease) with claudication:  -ABI shows right WNL and left with moderate disease- can follow up outpatient with vascular  Cellulitis:  -Discontinue antibiotics. Has finished 10 day course doxy 06/27/13 UTI  - E coli resistant to FQ- outpatient study. Sensitive to rocephin.  -Finished 7 days of cefuroxime 06/25/2013  Tobacco abuse  -encourage cessation  Venous stasis dermatitis  Hyperlipidemia  -Continue Lipitor  DM2  -HgbA1C- 7.9  -Patient had episodes of hypoglycemia due to poor po intake  -d/c lantus and observe  -Continue NovoLog sliding scale -CBGs with wide variations--suspected due to the patient's  variations in oral intake -Due to the patient's recent hypoglycemic episodes, will continue conservative treatment Family Communication: updated daughter, [name]-- today Disposition Plan: Home vs SNF          Procedures/Studies: Dg Chest 2 View  06/18/2013   CLINICAL DATA:  Shortness of breath, history of CHF  EXAM: CHEST  2 VIEW  COMPARISON:  DG CHEST 1V PORT dated 02/11/2013  FINDINGS: Low lung volumes. Cardiac silhouette is enlarged. Patient is status post median sternotomy coronary artery bypass grafting. There is diffuse prominence of the interstitial markings and mild peribronchial cuffing. Areas of increased density projects within the lung bases. No focal regions of consolidation appreciated. There is blunting of the costophrenic angles. The osseous structures unremarkable.  IMPRESSION: Interstitial findings likely representing pulmonary edema. A component of chronic bronchitic changes also a diagnostic consideration. Bibasilar infiltrate versus atelectasis. Surveillance evaluation recommended.   Electronically Signed   By: [name] M.D.   On: 06/18/2013 18:16   Dg Tibia/fibula Left  06/16/2013   CLINICAL DATA:  68 year old female with lower extremity soft tissue injury, abrasions/burning, infected. Initial encounter.  EXAM: LEFT TIBIA AND FIBULA - 2 VIEW  COMPARISON:  Left lower extremity venous ultrasound from the same day reported separately.  FINDINGS: Small medial surgical clips near the level of the knee joint. Evidence of widespread subcutaneous stranding and increased density at the level of the tib-fib. Questionable increased striated pattern of muscles in the calf. No subcutaneous gas. Calcified atherosclerosis. No radiopaque foreign body identified.  No acute fracture or dislocation identified in the tibia or fibula. Bulky chronic degenerative spurring/heterotopic ossification at the tibial tuberosity. No cortical osteolysis identified.  IMPRESSION: 1. Increased soft tissue  density compatible with cellulitis in this clinical setting. No subcutaneous gas or  retained foreign body identified. 2. No acute osseous abnormality identified. Advanced chronic posttraumatic / degenerative changes at the tibial tuberosity.   Electronically Signed   By: Augusto Gamble M.D.   On: 06/16/2013 15:12   US Renal  06/18/2013   CLINICAL DATA:  An elevated creatinine. History of chronic renal disease  EXAM: RENAL/URINARY TRACT ULTRASOUND COMPLETE  COMPARISON:  DG ABD PORTABLE 1V dated 02/12/2013; US RENAL dated 02/09/2013  FINDINGS: Right Kidney:  Length: 9.1 cm. There is decreased corticomedullary differentiation. A small benign-appearing 0.7 x 0.4 x 0.7 cm cyst is identified within the midpole periphery the right kidney. There is no evidence of solid-appearing masses, hydronephrosis nor calculi.  Left Kidney:  Length: 11.9 cm. There is decreased corticomedullary differentiation. There is no evidence of hydronephrosis, solid or cystic masses, nor calculi.  Bladder:  Urinary bladder is decompressed.  IMPRESSION: Increased cortical echogenicity bilaterally. Cysts consistent with patient's history of chronic renal disease. There is no evidence of obstructive uropathy no calculi. Small benign-appearing cyst is appreciated within the midpole region right kidney.   Electronically Signed   By: [name] M.D.   On: 06/18/2013 18:29   Nm Pulmonary Perf And Vent  06/25/2013   CLINICAL DATA:  Pulmonary hypertension  EXAM: NUCLEAR MEDICINE VENTILATION - PERFUSION LUNG SCAN  TECHNIQUE: Ventilation images were obtained in multiple projections using inhaled aerosol technetium 99 M DTPA. Perfusion images were obtained in multiple projections after intravenous injection of Tc-21m MAA.  COMPARISON:  Chest radiograph 06/24/2013  RADIOPHARMACEUTICALS:  40.0 mCi Tc-30m DTPA aerosol and 6.0 mCi Tc-64m MAA  FINDINGS: Ventilation: Mild peripheral irregularity of ventilation.  Enlargement of cardiac silhouette with diminished  ventilation in lingula and left lower lobe.  Mild central airway deposition of tracer.  Perfusion: Enlargement of cardiac silhouette.  Mild diminished perfusion to left lower lobe and lingula, less severe than ventilatory abnormality.  Single small subsegmental perfusion defect in left lower lobe.  No other perfusion defects identified.  Chest radiograph significant for enlargement of cardiac silhouette post CABG and bibasilar airspace disease.  IMPRESSION: Very low probability for pulmonary embolism.   Electronically Signed   By: Ulyses Southward M.D.   On: 06/25/2013 11:45   US Venous Img Lower Unilateral Left  06/16/2013   CLINICAL DATA:  Soft tissue edema  EXAM: LEFT LOWER EXTREMITY VENOUS DUPLEX ULTRASOUND  TECHNIQUE: Gray-scale sonography with graded compression, as well as color Doppler and duplex ultrasound, were performed to evaluate the deep venous system from the level of the common femoral vein through the popliteal and proximal calf veins. Spectral Doppler was utilized to evaluate flow at rest and with distal augmentation maneuvers.  COMPARISON:  None.  FINDINGS: Flow in the venous structures of the left lower extremity is spontaneous and phasic in all segments. There is normal compression and augmentation in the venous structures of the left lower extremity. Venous Doppler signal is normal in all regions. There is no thrombus in the deep or visualized superficial venous structures on the left. There is no deep venous incompetence. There is soft tissue edema which is causing some diminished visualization of superficial venous structures in the thigh and calf regions.  IMPRESSION: No evidence of left lower extremity deep venous thrombosis. Moderate soft tissue edema.   Electronically Signed   By: Bretta Bang M.D.   On: 06/16/2013 14:30   Dg Chest Port 1 View  06/24/2013   CLINICAL DATA:  Central line placement  EXAM: PORTABLE CHEST - 1 VIEW  COMPARISON:  DG CHEST 2 VIEW dated 06/18/2013  FINDINGS:  There is interval placement of a right central venous line with tip in distal SVC. No pneumothorax. There is bilateral mild airspace disease in the lung bases. There is small effusions.  IMPRESSION: 1. Right central venous line in good position without complication. 2. Increase in bibasilar airspace disease suggests pulmonary edema.   Electronically Signed   By: Genevive BiStewart  Edmunds M.D.   On: 06/24/2013 18:59         Subjective: Patient complains of dyspnea with minimal exertion. She is comfortable at rest. Denies any chest discomfort, nausea, vomiting, diarrhea, abdominal pain, leg pain. No headache or dizziness. Denies fevers or chills. States the coughing has improved. Had 2 bowel movements yesterday.  Objective: Filed Vitals:   06/27/13 1424 06/27/13 1958 06/27/13 2024 06/28/13 0425  BP: 119/67 104/59  127/68  Pulse: 117 123 110 126  Temp: 98.7 F (37.1 C) 98.8 F (37.1 C)  99.5 F (37.5 C)  TempSrc: Oral Oral  Oral  Resp: 18 19 18 20   Height:      Weight:    71.578 kg (157 lb 12.8 oz)  SpO2: 97% 91% 91% 90%    Intake/Output Summary (Last 24 hours) at 06/28/13 0840 Last data filed at 06/28/13 0800  Gross per 24 hour  Intake 1050.6 ml  Output   1525 ml  Net -474.4 ml   Weight change: -0.454 kg (-1 lb) Exam:   General:  Pt is alert, follows commands appropriately, not in acute distress  HEENT: No icterus, No thrush,  [place]/AT  Cardiovascular: RRR, S1/S2, no rubs, +S3  Respiratory: Bibasilar crackles. No wheezing. Good air movement.  Abdomen: Soft/+BS, non tender, non distended, no guarding  Extremities: trace LE edema, No lymphangitis, No petechiae, bilateral venous stasis changes  Data Reviewed: Basic Metabolic Panel:  Recent Labs Lab 06/24/13 0533 06/24/13 1455 06/25/13 0510 06/26/13 0500 06/27/13 0438 06/28/13 0600  NA 137  --  138 137 135* 134*  K 3.5*  --  3.5* 3.9 3.6* 3.3*  CL 96  --  92* 90* 86* 85*  CO2 25  --  29 30 31 31   GLUCOSE 72  --  99 161*  185* 163*  BUN 79*  --  80* 88* 93* 97*  CREATININE 3.97* 3.86* 3.80* 3.90* 3.89* 4.07*  CALCIUM 9.1  --  8.7 9.0 8.8 8.9  PHOS 6.6*  --  7.1* 8.5* 8.1* 8.5*   Liver Function Tests:  Recent Labs Lab 06/24/13 0533 06/25/13 0510 06/26/13 0500 06/27/13 0438 06/28/13 0600  ALBUMIN 2.8* 2.7* 2.9* 2.8* 2.7*   No results found for this basename: LIPASE, AMYLASE,  in the last 168 hours No results found for this basename: AMMONIA,  in the last 168 hours CBC:  Recent Labs Lab 06/23/13 0600 06/24/13 1455 06/26/13 1200 06/27/13 0438  WBC 6.1 6.2 8.1 7.7  HGB 11.5* 10.8* 10.9* 10.4*  HCT 35.4* 33.1* 32.6* 32.1*  MCV 85.3 83.6 82.3 82.9  PLT 113* 117* 109* 102*   Cardiac Enzymes: No results found for this basename: CKTOTAL, CKMB, CKMBINDEX, TROPONINI,  in the last 168 hours BNP: No components found with this basename: POCBNP,  CBG:  Recent Labs Lab 06/27/13 0628 06/27/13 1116 06/27/13 1612 06/27/13 2106 06/28/13 0555  GLUCAP 167* 222* 302* 96 171*    Recent Results (from the past 240 hour(s))  URINE CULTURE     Status: None   Collection Time    06/18/13  6:34 PM  Result Value Ref Range Status   Specimen Description URINE, RANDOM   Final   Special Requests NONE   Final   Culture  Setup Time     Final   Value: 06/18/2013 19:27     Performed at [facility] Count     Final   Value: >=100,000 COLONIES/ML     Performed at [facility]   Culture     Final   Value: ESCHERICHIA COLI     Performed at [facility]   Report Status 06/20/2013 FINAL   Final   Organism ID, Bacteria ESCHERICHIA COLI   Final     Scheduled Meds: . atorvastatin  80 mg Oral q1800  . budesonide-formoterol  2 puff Inhalation BID  . calcitRIOL  0.25 mcg Oral Daily  . calcium acetate  1,334 mg Oral TID WC  . clopidogrel  75 mg Oral Q breakfast  . DULoxetine  60 mg Oral Daily  . heparin  5,000 Units Subcutaneous 3 times per day  . insulin aspart  0-9 Units  Subcutaneous TID WC  . ketoconazole   Topical BID  . pantoprazole  40 mg Oral Daily  . polyethylene glycol  17 g Oral Daily  . potassium chloride  40 mEq Oral Once  . pregabalin  50 mg Oral BID  . senna  2 tablet Oral Daily  . sildenafil  20 mg Oral TID  . sodium chloride  3 mL Intravenous Q12H  . torsemide  40 mg Oral BID  . [START ON 06/29/2013] vancomycin  1,000 mg Intravenous To OR   Continuous Infusions: . milrinone 0.252 mcg/kg/min (06/28/13 0800)     Breezie Micucci, DO  [facility] Pager [phone]  If 7PM-7AM, please contact night-coverage [email or web address] Password [email or web address] 06/28/2013, 8:40 AM   LOS: 12 days

## 2013-06-28 NOTE — Progress Notes (Signed)
Patient ID: Tawny HoppingMary I Dozier, female   DOB: [birth year], 68 y.o.   MRN: [id number] S:feels tired O:BP 127/68  Pulse 126  Temp(Src) 99.5 F (37.5 C) (Oral)  Resp 20  Ht 5' (1.524 m)  Wt 71.578 kg (157 lb 12.8 oz)  BMI 30.82 kg/m2  SpO2 91%  Intake/Output Summary (Last 24 hours) at 06/28/13 1227 Last data filed at 06/28/13 1100  Gross per 24 hour  Intake 1401.33 ml  Output   1325 ml  Net  76.33 ml   Intake/Output: I/O last 3 completed shifts: In: 720 [P.O.:720] Out: 2125 [Urine:2125]  Intake/Output this shift:  Total I/O In: 921.3 [I.V.:921.3] Out: -  Weight change: -0.454 kg (-1 lb) WUJ:WJXBJYNWGen:fatigued WF in NAd CVS:no rub Resp:bibasliar crackles Abd:+BS, soft NT Ext:+pretib edema   Recent Labs Lab 06/22/13 1350 06/23/13 0340 06/23/13 0600 06/24/13 0533 06/24/13 1455 06/25/13 0510 06/26/13 0500 06/27/13 0438 06/28/13 0600  NA 139 139 140 137  --  138 137 135* 134*  K 3.9 3.7 3.9 3.5*  --  3.5* 3.9 3.6* 3.3*  CL 101 99 100 96  --  92* 90* 86* 85*  CO2 23 24 23 25   --  29 30 31 31   GLUCOSE 168* 144* 125* 72  --  99 161* 185* 163*  BUN 65* 69* 69* 79*  --  80* 88* 93* 97*  CREATININE 4.01* 3.90* 3.93* 3.97* 3.86* 3.80* 3.90* 3.89* 4.07*  ALBUMIN 2.8* 3.0*  --  2.8*  --  2.7* 2.9* 2.8* 2.7*  CALCIUM 8.6 9.0 9.0 9.1  --  8.7 9.0 8.8 8.9  PHOS 6.3* 6.4*  --  6.6*  --  7.1* 8.5* 8.1* 8.5*   Liver Function Tests:  Recent Labs Lab 06/26/13 0500 06/27/13 0438 06/28/13 0600  ALBUMIN 2.9* 2.8* 2.7*   No results found for this basename: LIPASE, AMYLASE,  in the last 168 hours No results found for this basename: AMMONIA,  in the last 168 hours CBC:  Recent Labs Lab 06/23/13 0600 06/24/13 1455 06/26/13 1200 06/27/13 0438  WBC 6.1 6.2 8.1 7.7  HGB 11.5* 10.8* 10.9* 10.4*  HCT 35.4* 33.1* 32.6* 32.1*  MCV 85.3 83.6 82.3 82.9  PLT 113* 117* 109* 102*   Cardiac Enzymes: No results found for this basename: CKTOTAL, CKMB, CKMBINDEX, TROPONINI,  in the last 168  hours CBG:  Recent Labs Lab 06/27/13 1116 06/27/13 1612 06/27/13 2106 06/28/13 0555 06/28/13 1128  GLUCAP 222* 302* 96 171* 251*    Iron Studies: No results found for this basename: IRON, TIBC, TRANSFERRIN, FERRITIN,  in the last 72 hours Studies/Results: No results found. Marland Kitchen. atorvastatin  80 mg Oral q1800  . budesonide-formoterol  2 puff Inhalation BID  . calcitRIOL  0.25 mcg Oral Daily  . calcium acetate  1,334 mg Oral TID WC  . clopidogrel  75 mg Oral Q breakfast  . DULoxetine  60 mg Oral Daily  . heparin  5,000 Units Subcutaneous 3 times per day  . insulin aspart  0-9 Units Subcutaneous TID WC  . ketoconazole   Topical BID  . pantoprazole  40 mg Oral Daily  . polyethylene glycol  17 g Oral Daily  . pregabalin  50 mg Oral BID  . senna  2 tablet Oral Daily  . sildenafil  20 mg Oral TID  . sodium chloride  3 mL Intravenous Q12H  . torsemide  40 mg Oral BID  . [START ON 06/29/2013] vancomycin  1,000 mg Intravenous To OR  BMET    Component Value Date/Time   NA 134* 06/28/2013 0600   K 3.3* 06/28/2013 0600   CL 85* 06/28/2013 0600   CO2 31 06/28/2013 0600   GLUCOSE 163* 06/28/2013 0600   BUN 97* 06/28/2013 0600   CREATININE 4.07* 06/28/2013 0600   CALCIUM 8.9 06/28/2013 0600   GFRNONAA 10* 06/28/2013 0600   GFRAA 12* 06/28/2013 0600   CBC    Component Value Date/Time   WBC 7.7 06/27/2013 0438   WBC 9.2 12/19/2012 1601   RBC 3.87 06/27/2013 0438   RBC 4.66 12/19/2012 1601   HGB 10.4* 06/27/2013 0438   HGB 13.7 12/19/2012 1601   HCT 32.1* 06/27/2013 0438   HCT 42.9 12/19/2012 1601   PLT 102* 06/27/2013 0438   MCV 82.9 06/27/2013 0438   MCV 92.0 12/19/2012 1601   MCH 26.9 06/27/2013 0438   MCH 29.4 12/19/2012 1601   MCHC 32.4 06/27/2013 0438   MCHC 31.9 12/19/2012 1601   RDW 15.5 06/27/2013 0438   LYMPHSABS 1.6 06/16/2013 1915   MONOABS 0.4 06/16/2013 1915   EOSABS 0.2 06/16/2013 1915   BASOSABS 0.0 06/16/2013 1915    Assessment/Plan:  1. CHF- acute on chronic systolic now s/p  RHC c/w restrictive CMP. Started on milrinone to max cardiac output, unfortunately has had decreasing response to diuretics.    1. Had [name] lenghty discussion with Mrs. Shindler to review the severity and chronicity of her disease processes.  She is more willing to proceed with HD to try and improve her overall condition but is aware of the complexity of her issues. 2. Discussed with Dr. Darrick Penna and will plan on placing PC tomorrow as well as AVF and start HD as she has failed milrinone and demadex. 3. Stop sildenafil if ok with HF team 4. Hyponatremia, increasing BUN,  worsening renal hemodynamics and now nearing ESRD, plan as above.  2. 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 Now with uremic symptoms, lethargy and asterixis 1. Appreciate HF team's input however her BUN/Cr cont to worsen and will plan on proceeding with HD  2. VVS consult for placement of AVF and now [name] tunneled HD on Monday (discussed with Dr. Darrick Penna) and initiate HD afterwards.  2. Pulm HTN - work up underway 3. Hypokalemia- replete and follow 4. ACDz- stable 5. SHPTH: on vit D and binders 6. UTI- on ceftin and doxy (cellulitis resolved) 7. HTN- stable 8. DM- per primary svc 9. Vascular access- for LUE AVF 06/29/13 per Dr. Sondra Barges as well as [name] tunneled HD cath as her renal function continues to deteriorate 10.   [name]

## 2013-06-29 ENCOUNTER — Encounter (HOSPITAL_COMMUNITY)
Admission: EM | Disposition: A | Discharge status: Hospice | Payer: Self-pay | Source: Home / Self Care | Attending: Internal Medicine

## 2013-06-29 ENCOUNTER — Encounter (HOSPITAL_COMMUNITY): Payer: Medicare Other | Admitting: Anesthesiology

## 2013-06-29 ENCOUNTER — Encounter (HOSPITAL_COMMUNITY): Payer: Self-pay | Admitting: Anesthesiology

## 2013-06-29 ENCOUNTER — Inpatient Hospital Stay (HOSPITAL_COMMUNITY): Payer: Medicare Other

## 2013-06-29 ENCOUNTER — Inpatient Hospital Stay (HOSPITAL_COMMUNITY): Payer: Medicare Other | Admitting: Anesthesiology

## 2013-06-29 HISTORY — PX: INSERTION OF DIALYSIS CATHETER: SHX1324

## 2013-06-29 HISTORY — PX: AV FISTULA PLACEMENT: SHX1204

## 2013-06-29 LAB — GLUCOSE, CAPILLARY
GLUCOSE-CAPILLARY: 119 mg/dL — AB (ref 70–99)
GLUCOSE-CAPILLARY: 121 mg/dL — AB (ref 70–99)
Glucose-Capillary: 183 mg/dL — ABNORMAL HIGH (ref 70–99)
Glucose-Capillary: 181 mg/dL — ABNORMAL HIGH (ref 70–99)
Glucose-Capillary: 157 mg/dL — ABNORMAL HIGH (ref 70–99)

## 2013-06-29 LAB — IGG, IGA, IGM
IGA: 464 mg/dL — AB (ref 69–380)
IGG (IMMUNOGLOBIN G), SERUM: 1860 mg/dL — AB (ref 690–1700)
IGM, SERUM: 144 mg/dL (ref 52–322)

## 2013-06-29 LAB — RENAL FUNCTION PANEL
ALBUMIN: 2.7 g/dL — AB (ref 3.5–5.2)
BUN: 99 mg/dL — AB (ref 6–23)
CO2: 32 mEq/L (ref 19–32)
Calcium: 9.1 mg/dL (ref 8.4–10.5)
Chloride: 89 mEq/L — ABNORMAL LOW (ref 96–112)
Creatinine, Ser: 3.92 mg/dL — ABNORMAL HIGH (ref 0.50–1.10)
GFR calc Af Amer: 13 mL/min — ABNORMAL LOW (ref >90)
GFR calc non Af Amer: 11 mL/min — ABNORMAL LOW (ref >90)
Glucose, Bld: 181 mg/dL — ABNORMAL HIGH (ref 70–99)
PHOSPHORUS: 8.1 mg/dL — AB (ref 2.3–4.6)
Potassium: 3.6 mEq/L — ABNORMAL LOW (ref 3.7–5.3)
SODIUM: 138 meq/L (ref 137–147)

## 2013-06-29 LAB — UIFE/LIGHT CHAINS/TP QN, 24-HR UR
ALPHA 2 UR: DETECTED — AB
Albumin, U: DETECTED
Alpha 1, Urine: DETECTED — AB
BETA UR: DETECTED — AB
FREE KAPPA LT CHAINS, UR: 12.3 mg/dL — AB (ref 0.14–2.42)
FREE KAPPA/LAMBDA RATIO: 9.39 ratio (ref 2.04–10.37)
Free Lambda Lt Chains,Ur: 1.31 mg/dL — ABNORMAL HIGH (ref 0.02–0.67)
Gamma Globulin, Urine: DETECTED — AB
TOTAL PROTEIN, URINE-UPE24: 18 mg/dL

## 2013-06-29 LAB — PROTEIN ELECTROPH W RFLX QUANT IMMUNOGLOBULINS
Albumin ELP: 44.7 % — ABNORMAL LOW (ref 55.8–66.1)
Alpha-1-Globulin: 6.8 % — ABNORMAL HIGH (ref 2.9–4.9)
Alpha-2-Globulin: 12.6 % — ABNORMAL HIGH (ref 7.1–11.8)
Beta 2: 6.6 % — ABNORMAL HIGH (ref 3.2–6.5)
Beta Globulin: 6.3 % (ref 4.7–7.2)
GAMMA GLOBULIN: 23 % — AB (ref 11.1–18.8)
M-SPIKE, %: NOT DETECTED g/dL
Total Protein ELP: 6.5 g/dL (ref 6.0–8.3)

## 2013-06-29 LAB — URINALYSIS, ROUTINE W REFLEX MICROSCOPIC
BILIRUBIN URINE: NEGATIVE
Glucose, UA: NEGATIVE mg/dL
Hgb urine dipstick: NEGATIVE
KETONES UR: NEGATIVE mg/dL
LEUKOCYTES UA: NEGATIVE
Nitrite: NEGATIVE
PROTEIN: NEGATIVE mg/dL
Specific Gravity, Urine: 1.013 (ref 1.005–1.030)
UROBILINOGEN UA: 0.2 mg/dL (ref 0.0–1.0)
pH: 5 (ref 5.0–8.0)

## 2013-06-29 LAB — CBC
HEMATOCRIT: 31.1 % — AB (ref 36.0–46.0)
Hemoglobin: 10 g/dL — ABNORMAL LOW (ref 12.0–15.0)
MCH: 26.8 pg (ref 26.0–34.0)
MCHC: 32.2 g/dL (ref 30.0–36.0)
MCV: 83.4 fL (ref 78.0–100.0)
Platelets: 129 10*3/uL — ABNORMAL LOW (ref 150–400)
RBC: 3.73 MIL/uL — ABNORMAL LOW (ref 3.87–5.11)
RDW: 15.3 % (ref 11.5–15.5)
WBC: 7.7 10*3/uL (ref 4.0–10.5)

## 2013-06-29 LAB — IMMUNOFIXATION ADD-ON

## 2013-06-29 LAB — HEPATITIS B SURFACE ANTIGEN: HEP B S AG: NEGATIVE

## 2013-06-29 LAB — MRSA PCR SCREENING: MRSA by PCR: NEGATIVE

## 2013-06-29 LAB — HEPATITIS B CORE ANTIBODY, TOTAL: HEP B C TOTAL AB: NONREACTIVE

## 2013-06-29 LAB — HEPATITIS B SURFACE ANTIBODY,QUALITATIVE: Hep B S Ab: NEGATIVE

## 2013-06-29 SURGERY — INSERTION OF DIALYSIS CATHETER
Anesthesia: General

## 2013-06-29 SURGERY — ARTERIOVENOUS (AV) FISTULA CREATION
Anesthesia: General | Site: Neck | Laterality: Left

## 2013-06-29 MED ORDER — FENTANYL CITRATE 0.05 MG/ML IJ SOLN
INTRAMUSCULAR | Status: AC
  Filled 2013-06-29: qty 5

## 2013-06-29 MED ORDER — LIDOCAINE HCL (PF) 1 % IJ SOLN: 5.0000 mL | INTRAMUSCULAR | Status: DC | PRN

## 2013-06-29 MED ORDER — FENTANYL CITRATE 0.05 MG/ML IJ SOLN: 25.0000 ug | INTRAMUSCULAR | Status: DC | PRN

## 2013-06-29 MED ORDER — ARTIFICIAL TEARS OP OINT
TOPICAL_OINTMENT | OPHTHALMIC | Status: AC
  Filled 2013-06-29: qty 3.5

## 2013-06-29 MED ORDER — SODIUM CHLORIDE 0.9 % IV SOLN: 100.0000 mL | INTRAVENOUS | Status: DC | PRN

## 2013-06-29 MED ORDER — FENTANYL CITRATE 0.05 MG/ML IJ SOLN
INTRAMUSCULAR | Status: DC | PRN
Start: 2013-06-29 — End: 2013-06-29
  Administered 2013-06-29 (×2): 50 ug via INTRAVENOUS

## 2013-06-29 MED ORDER — LIDOCAINE HCL (CARDIAC) 20 MG/ML IV SOLN
INTRAVENOUS | Status: DC | PRN
  Administered 2013-06-29: 50 mg via INTRAVENOUS

## 2013-06-29 MED ORDER — 0.9 % SODIUM CHLORIDE (POUR BTL) OPTIME
TOPICAL | Status: DC | PRN
  Administered 2013-06-29: 1000 mL

## 2013-06-29 MED ORDER — PROPOFOL 10 MG/ML IV BOLUS
INTRAVENOUS | Status: AC
  Filled 2013-06-29: qty 20

## 2013-06-29 MED ORDER — MIDAZOLAM HCL 2 MG/2ML IJ SOLN
INTRAMUSCULAR | Status: AC
  Filled 2013-06-29: qty 2

## 2013-06-29 MED ORDER — SODIUM CHLORIDE 0.9 % IR SOLN
Status: DC | PRN
  Administered 2013-06-29: 10:00:00

## 2013-06-29 MED ORDER — ARTIFICIAL TEARS OP OINT
TOPICAL_OINTMENT | OPHTHALMIC | Status: DC | PRN
  Administered 2013-06-29: 1 via OPHTHALMIC

## 2013-06-29 MED ORDER — ONDANSETRON HCL 4 MG/2ML IJ SOLN
INTRAMUSCULAR | Status: AC
  Filled 2013-06-29: qty 2

## 2013-06-29 MED ORDER — HEPARIN SODIUM (PORCINE) 1000 UNIT/ML DIALYSIS
1000.0000 [IU] | INTRAMUSCULAR | Status: DC | PRN
Start: 2013-06-29 — End: 2013-06-29

## 2013-06-29 MED ORDER — ALTEPLASE 2 MG IJ SOLR
2.0000 mg | Freq: Once | INTRAMUSCULAR | Status: DC | PRN
  Filled 2013-06-29: qty 2

## 2013-06-29 MED ORDER — LIDOCAINE-PRILOCAINE 2.5-2.5 % EX CREA
1.0000 "application " | TOPICAL_CREAM | CUTANEOUS | Status: DC | PRN
  Filled 2013-06-29: qty 5

## 2013-06-29 MED ORDER — LIDOCAINE-EPINEPHRINE 0.5 %-1:200000 IJ SOLN
INTRAMUSCULAR | Status: AC
  Filled 2013-06-29: qty 1

## 2013-06-29 MED ORDER — PENTAFLUOROPROP-TETRAFLUOROETH EX AERO: 1.0000 "application " | INHALATION_SPRAY | CUTANEOUS | Status: DC | PRN

## 2013-06-29 MED ORDER — ETOMIDATE 2 MG/ML IV SOLN
INTRAVENOUS | Status: DC | PRN
  Administered 2013-06-29: 14 mg via INTRAVENOUS

## 2013-06-29 MED ORDER — HEPARIN SODIUM (PORCINE) 1000 UNIT/ML IJ SOLN
INTRAMUSCULAR | Status: AC
  Filled 2013-06-29: qty 1

## 2013-06-29 MED ORDER — ONDANSETRON HCL 4 MG/2ML IJ SOLN
INTRAMUSCULAR | Status: DC | PRN
  Administered 2013-06-29: 4 mg via INTRAVENOUS

## 2013-06-29 MED ORDER — PHENYLEPHRINE HCL 10 MG/ML IJ SOLN
10.0000 mg | INTRAVENOUS | Status: DC | PRN
  Administered 2013-06-29: 10 ug/min via INTRAVENOUS

## 2013-06-29 MED ORDER — HEPARIN SODIUM (PORCINE) 1000 UNIT/ML IJ SOLN
INTRAMUSCULAR | Status: DC | PRN
  Administered 2013-06-29: 10 mL

## 2013-06-29 MED ORDER — NEPRO/CARBSTEADY PO LIQD
237.0000 mL | ORAL | Status: DC | PRN
  Filled 2013-06-29: qty 237

## 2013-06-29 SURGICAL SUPPLY — 67 items
APL SKNCLS STERI-STRIP NONHPOA (GAUZE/BANDAGES/DRESSINGS) ×2
ARMBAND PINK RESTRICT EXTREMIT (MISCELLANEOUS) ×4 IMPLANT
BAG BANDED W/RUBBER/TAPE 36X54 (MISCELLANEOUS) ×2 IMPLANT
BAG DECANTER FOR FLEXI CONT (MISCELLANEOUS) ×4 IMPLANT
BAG EQP BAND 135X91 W/RBR TAPE (MISCELLANEOUS) ×2
BENZOIN TINCTURE PRP APPL 2/3 (GAUZE/BANDAGES/DRESSINGS) ×4 IMPLANT
CANISTER SUCTION 2500CC (MISCELLANEOUS) ×4 IMPLANT
CATH CANNON HEMO 15F 50CM (CATHETERS) IMPLANT
CATH CANNON HEMO 15FR 19 (HEMODIALYSIS SUPPLIES) IMPLANT
CATH CANNON HEMO 15FR 23CM (HEMODIALYSIS SUPPLIES) IMPLANT
CATH CANNON HEMO 15FR 31CM (HEMODIALYSIS SUPPLIES) IMPLANT
CATH CANNON HEMO 15FR 32 (HEMODIALYSIS SUPPLIES) IMPLANT
CATH CANNON HEMO 15FR 32CM (HEMODIALYSIS SUPPLIES) ×4 IMPLANT
CLIP LIGATING EXTRA MED SLVR (CLIP) ×4 IMPLANT
CLIP LIGATING EXTRA SM BLUE (MISCELLANEOUS) ×4 IMPLANT
CLOSURE WOUND 1/2 X4 (GAUZE/BANDAGES/DRESSINGS) ×1
COVER DOME SNAP 22 D (MISCELLANEOUS) ×2 IMPLANT
COVER PROBE W GEL 5X96 (DRAPES) ×4 IMPLANT
COVER SURGICAL LIGHT HANDLE (MISCELLANEOUS) ×4 IMPLANT
DECANTER SPIKE VIAL GLASS SM (MISCELLANEOUS) ×2 IMPLANT
DRAPE C-ARM 42X72 X-RAY (DRAPES) ×2 IMPLANT
DRAPE CHEST BREAST 15X10 FENES (DRAPES) ×4 IMPLANT
ELECT REM PT RETURN 9FT ADLT (ELECTROSURGICAL) ×4
ELECTRODE REM PT RTRN 9FT ADLT (ELECTROSURGICAL) ×2 IMPLANT
GAUZE SPONGE 2X2 8PLY STRL LF (GAUZE/BANDAGES/DRESSINGS) ×2 IMPLANT
GAUZE SPONGE 4X4 16PLY XRAY LF (GAUZE/BANDAGES/DRESSINGS) ×4 IMPLANT
GEL ULTRASOUND 20GR AQUASONIC (MISCELLANEOUS) IMPLANT
GLOVE BIOGEL PI IND STRL 6.5 (GLOVE) IMPLANT
GLOVE BIOGEL PI INDICATOR 6.5 (GLOVE) ×4
GLOVE ECLIPSE 7.5 STRL STRAW (GLOVE) ×4 IMPLANT
GLOVE SS BIOGEL STRL SZ 7.5 (GLOVE) ×2 IMPLANT
GLOVE SUPERSENSE BIOGEL SZ 7.5 (GLOVE) ×4
GLOVE SURG SS PI 6.5 STRL IVOR (GLOVE) ×2 IMPLANT
GOWN STRL REUS W/ TWL LRG LVL3 (GOWN DISPOSABLE) ×6 IMPLANT
GOWN STRL REUS W/ TWL XL LVL3 (GOWN DISPOSABLE) IMPLANT
GOWN STRL REUS W/TWL LRG LVL3 (GOWN DISPOSABLE) ×12
GOWN STRL REUS W/TWL XL LVL3 (GOWN DISPOSABLE) ×8
KIT BASIN OR (CUSTOM PROCEDURE TRAY) ×4 IMPLANT
KIT ROOM TURNOVER OR (KITS) ×4 IMPLANT
NDL 18GX1X1/2 (RX/OR ONLY) (NEEDLE) ×2 IMPLANT
NDL HYPO 25GX1X1/2 BEV (NEEDLE) ×2 IMPLANT
NEEDLE 18GX1X1/2 (RX/OR ONLY) (NEEDLE) ×4 IMPLANT
NEEDLE 22X1 1/2 (OR ONLY) (NEEDLE) ×4 IMPLANT
NEEDLE HYPO 25GX1X1/2 BEV (NEEDLE) ×4 IMPLANT
NS IRRIG 1000ML POUR BTL (IV SOLUTION) ×4 IMPLANT
PACK CV ACCESS (CUSTOM PROCEDURE TRAY) ×4 IMPLANT
PACK SURGICAL SETUP 50X90 (CUSTOM PROCEDURE TRAY) ×4 IMPLANT
PAD ARMBOARD 7.5X6 YLW CONV (MISCELLANEOUS) ×8 IMPLANT
SOAP 2 % CHG 4 OZ (WOUND CARE) ×4 IMPLANT
SPONGE GAUZE 2X2 STER 10/PKG (GAUZE/BANDAGES/DRESSINGS) ×2
SPONGE GAUZE 4X4 12PLY (GAUZE/BANDAGES/DRESSINGS) ×4 IMPLANT
STRIP CLOSURE SKIN 1/2X4 (GAUZE/BANDAGES/DRESSINGS) ×3 IMPLANT
SUT ETHILON 3 0 PS 1 (SUTURE) ×4 IMPLANT
SUT PROLENE 6 0 CC (SUTURE) ×4 IMPLANT
SUT VIC AB 3-0 SH 27 (SUTURE) ×4
SUT VIC AB 3-0 SH 27X BRD (SUTURE) ×2 IMPLANT
SUT VICRYL 4-0 PS2 18IN ABS (SUTURE) ×4 IMPLANT
SYR 20CC LL (SYRINGE) ×4 IMPLANT
SYR 30ML LL (SYRINGE) IMPLANT
SYR 5ML LL (SYRINGE) ×8 IMPLANT
SYR CONTROL 10ML LL (SYRINGE) ×4 IMPLANT
SYRINGE 10CC LL (SYRINGE) ×4 IMPLANT
TAPE CLOTH SURG 4X10 WHT LF (GAUZE/BANDAGES/DRESSINGS) ×2 IMPLANT
TOWEL OR 17X24 6PK STRL BLUE (TOWEL DISPOSABLE) ×4 IMPLANT
TOWEL OR 17X26 10 PK STRL BLUE (TOWEL DISPOSABLE) ×4 IMPLANT
UNDERPAD 30X30 INCONTINENT (UNDERPADS AND DIAPERS) ×4 IMPLANT
WATER STERILE IRR 1000ML POUR (IV SOLUTION) ×4 IMPLANT

## 2013-06-29 NOTE — Progress Notes (Signed)
  Patient in OR for AVF.   Weight and renal function relatively stable. We will see her again tomorrow.   Daniel Bensimhon,MD 8:53 AM

## 2013-06-29 NOTE — Progress Notes (Signed)
[facility] PROGRESS NOTE  Heidi HoppingMary I [name] ZOX:[id number] DOB: November 11, 1945 DOA: 06/16/2013 PCP: [name], [name], MD  Assessment/Plan: Acute on chronic renal failure (CKD4)  -Likely due to acute infection and a degree of cardiorenal syndrome  - Creatinine last year was ~2 in October 2014  -Appreciate nephrology followup  -06/29/13--L-IJ PermCath and -L-wrist AVF -first HD on 06/29/13 Acute on Chronic systolic and diastolic congestive heart failure  -EF 15-20%  -Switched to oral torsemide 40mg  bid on 06/26/13 -06/29/13--pt had worsen hypoxemia with pulm edema-->HD -Appreciate Dr. Gala RomneyBensimhon followup  -right heart catheterization--markedly elevated right heart pressures with moderate pulmonary hypertension--PAP=70/37  -V/Q scan--very low probability  -SPEP--no M-Spike -remains on milrinone  PAH  -Likely a combination of COPD and left heart failure  -Workup in progress per cardiology  -Await UPEP  -PFTs---FEV1=52% suggests COPD  -V/Q scan--very low probability  -need outpt polysomnography  -sildenafil started 06/24/13  Intermitten confusion/Delirium -multifactorial including but not limited to the patient's acute on chronic renal failure, decompensated CHF, metabolic arrangements  -06/29/2013 UA--no pyuria -Patient has required prolonged period of time to recover from any type of hypnotic medication/consolidation Acute respiratory failure -Patient had increasing oxygen demand after surgery on 06/29/2013--> started dialysis to remove fluid -Hypoxemia partly due to hypoventilation from sedation from surgery as well as pulmonary edema  -Hypoxemia improved after dialysis and with the patient becoming more alert  -repeat CXR--pulmonary vascular congestion Thrombocytopenia  -Concerned about HIT versus medication induced  -Send HIT panel--pending -labs do not suggest DIC  PVD (eripheral vascular disease) with claudication:  -ABI shows right WNL and left with moderate disease- can  follow up outpatient with vascular  Cellulitis:  -Discontinue antibiotics. Has finished 10 day course doxy 06/27/13  UTI  - E coli resistant to FQ- outpatient study. Sensitive to rocephin.  -Finished 7 days of cefuroxime 06/25/2013  Tobacco abuse  -encourage cessation   Hyperlipidemia  -Continue Lipitor  DM2  -HgbA1C- 7.9  -Patient had episodes of hypoglycemia due to poor po intake  -d/c lantus and observe  -Continue NovoLog sliding scale  -CBGs with wide variations--suspected due to the patient's variations in oral intake  -Due to the patient's recent hypoglycemic episodes, will continue conservative treatment  Family Communication: updated daughter, Toniann FailWendy-- today  Disposition Plan: Home vs SNF          Procedures/Studies: Dg Chest 2 View  06/28/2013   CLINICAL DATA:  CHF, dyspnea, history bronchitis, diabetes, hypertension, coronary artery disease post CABG  EXAM: CHEST  2 VIEW  COMPARISON:  06/24/2013  FINDINGS: Enlargement of cardiac silhouette post CABG.  Right jugular central venous catheter with tip projecting over SVC.  Atherosclerotic calcification aorta.  Mediastinal contour stable.  Improved pulmonary edema and left basilar opacity since previous exam.  No gross pleural effusion or pneumothorax.  Bones demineralized.  IMPRESSION: Enlargement of cardiac silhouette post CABG.  Improved pulmonary edema and left basilar opacity since previous study.   Electronically Signed   By: Ulyses SouthwardMark  Boles M.D.   On: 06/28/2013 18:52   Dg Chest 2 View  06/18/2013   CLINICAL DATA:  Shortness of breath, history of CHF  EXAM: CHEST  2 VIEW  COMPARISON:  DG CHEST 1V PORT dated 02/11/2013  FINDINGS: Low lung volumes. Cardiac silhouette is enlarged. Patient is status post median sternotomy coronary artery bypass grafting. There is diffuse prominence of the interstitial markings and mild peribronchial cuffing. Areas of increased density projects within the lung bases. No focal regions of consolidation  appreciated. There is  blunting of the costophrenic angles. The osseous structures unremarkable.  IMPRESSION: Interstitial findings likely representing pulmonary edema. A component of chronic bronchitic changes also a diagnostic consideration. Bibasilar infiltrate versus atelectasis. Surveillance evaluation recommended.   Electronically Signed   By: [name] M.D.   On: 06/18/2013 18:16   Dg Tibia/fibula Left  06/16/2013   CLINICAL DATA:  68 year old female with lower extremity soft tissue injury, abrasions/burning, infected. Initial encounter.  EXAM: LEFT TIBIA AND FIBULA - 2 VIEW  COMPARISON:  Left lower extremity venous ultrasound from the same day reported separately.  FINDINGS: Small medial surgical clips near the level of the knee joint. Evidence of widespread subcutaneous stranding and increased density at the level of the tib-fib. Questionable increased striated pattern of muscles in the calf. No subcutaneous gas. Calcified atherosclerosis. No radiopaque foreign body identified.  No acute fracture or dislocation identified in the tibia or fibula. Bulky chronic degenerative spurring/heterotopic ossification at the tibial tuberosity. No cortical osteolysis identified.  IMPRESSION: 1. Increased soft tissue density compatible with cellulitis in this clinical setting. No subcutaneous gas or retained foreign body identified. 2. No acute osseous abnormality identified. Advanced chronic posttraumatic / degenerative changes at the tibial tuberosity.   Electronically Signed   By: Augusto Gamble M.D.   On: 06/16/2013 15:12   US Renal  06/18/2013   CLINICAL DATA:  An elevated creatinine. History of chronic renal disease  EXAM: RENAL/URINARY TRACT ULTRASOUND COMPLETE  COMPARISON:  DG ABD PORTABLE 1V dated 02/12/2013; US RENAL dated 02/09/2013  FINDINGS: Right Kidney:  Length: 9.1 cm. There is decreased corticomedullary differentiation. A small benign-appearing 0.7 x 0.4 x 0.7 cm cyst is identified within the midpole  periphery the right kidney. There is no evidence of solid-appearing masses, hydronephrosis nor calculi.  Left Kidney:  Length: 11.9 cm. There is decreased corticomedullary differentiation. There is no evidence of hydronephrosis, solid or cystic masses, nor calculi.  Bladder:  Urinary bladder is decompressed.  IMPRESSION: Increased cortical echogenicity bilaterally. Cysts consistent with patient's history of chronic renal disease. There is no evidence of obstructive uropathy no calculi. Small benign-appearing cyst is appreciated within the midpole region right kidney.   Electronically Signed   By: [name] M.D.   On: 06/18/2013 18:29   Nm Pulmonary Perf And Vent  06/25/2013   CLINICAL DATA:  Pulmonary hypertension  EXAM: NUCLEAR MEDICINE VENTILATION - PERFUSION LUNG SCAN  TECHNIQUE: Ventilation images were obtained in multiple projections using inhaled aerosol technetium 99 M DTPA. Perfusion images were obtained in multiple projections after intravenous injection of Tc-79m MAA.  COMPARISON:  Chest radiograph 06/24/2013  RADIOPHARMACEUTICALS:  40.0 mCi Tc-21m DTPA aerosol and 6.0 mCi Tc-65m MAA  FINDINGS: Ventilation: Mild peripheral irregularity of ventilation.  Enlargement of cardiac silhouette with diminished ventilation in lingula and left lower lobe.  Mild central airway deposition of tracer.  Perfusion: Enlargement of cardiac silhouette.  Mild diminished perfusion to left lower lobe and lingula, less severe than ventilatory abnormality.  Single small subsegmental perfusion defect in left lower lobe.  No other perfusion defects identified.  Chest radiograph significant for enlargement of cardiac silhouette post CABG and bibasilar airspace disease.  IMPRESSION: Very low probability for pulmonary embolism.   Electronically Signed   By: Ulyses Southward M.D.   On: 06/25/2013 11:45   US Venous Img Lower Unilateral Left  06/16/2013   CLINICAL DATA:  Soft tissue edema  EXAM: LEFT LOWER EXTREMITY VENOUS DUPLEX  ULTRASOUND  TECHNIQUE: Gray-scale sonography with graded compression, as well as  color Doppler and duplex ultrasound, were performed to evaluate the deep venous system from the level of the common femoral vein through the popliteal and proximal calf veins. Spectral Doppler was utilized to evaluate flow at rest and with distal augmentation maneuvers.  COMPARISON:  None.  FINDINGS: Flow in the venous structures of the left lower extremity is spontaneous and phasic in all segments. There is normal compression and augmentation in the venous structures of the left lower extremity. Venous Doppler signal is normal in all regions. There is no thrombus in the deep or visualized superficial venous structures on the left. There is no deep venous incompetence. There is soft tissue edema which is causing some diminished visualization of superficial venous structures in the thigh and calf regions.  IMPRESSION: No evidence of left lower extremity deep venous thrombosis. Moderate soft tissue edema.   Electronically Signed   By: Bretta Bang M.D.   On: 06/16/2013 14:30   Dg Chest Port 1 View  06/29/2013   CLINICAL DATA:  Postoperative film, status post line placement.  EXAM: PORTABLE CHEST - 1 VIEW  COMPARISON:  DG CHEST 2 VIEW dated 06/28/2013  FINDINGS: Since the previous study there has been interval placement of a large caliber dual lumen catheter via the left internal jugular approach. The tip of this catheter lies in the region of the right atrium. The right internal jugular venous catheter tip lies in the midportion of the SVC. The cardiopericardial silhouette remains enlarged. Its borders are less distinct today. The pulmonary interstitial markings remain mildly increased. The left hemidiaphragm is partially obscured. No definite pleural effusions are demonstrated. The patient has undergone previous CABG.  IMPRESSION: 1. There has been interval placement of a dual-lumen, large caliber catheter via the left internal  jugular approach with the tip of the distal port lying in the region of the right atrium. 2. The pulmonary interstitial markings are somewhat more conspicuous today which may reflect interstitial edema or pneumonia. 3. The cardiopericardial silhouette remains enlarged and the pulmonary vascularity remains engorged centrally consistent with mild CHF.   Electronically Signed   By: Keiaira Donlan  [place]   On: 06/29/2013 11:33   Dg Chest Port 1 View  06/24/2013   CLINICAL DATA:  Central line placement  EXAM: PORTABLE CHEST - 1 VIEW  COMPARISON:  DG CHEST 2 VIEW dated 06/18/2013  FINDINGS: There is interval placement of a right central venous line with tip in distal SVC. No pneumothorax. There is bilateral mild airspace disease in the lung bases. There is small effusions.  IMPRESSION: 1. Right central venous line in good position without complication. 2. Increase in bibasilar airspace disease suggests pulmonary edema.   Electronically Signed   By: Genevive Bi M.D.   On: 06/24/2013 18:59         Subjective: Patient was somnolent but arousable. Denies any shortness of breath. Denies any headache, chest pain, nausea, vomiting, abdominal pain, dysuria.  Objective: Filed Vitals:   06/29/13 1600 06/29/13 1630 06/29/13 1705 06/29/13 1726  BP: 105/49  124/50 116/67  Pulse: 113 100 81 115  Temp:   97 F (36.1 C)   TempSrc:   Axillary   Resp: 19 23 20    Height:      Weight:   72.1 kg (158 lb 15.2 oz)   SpO2: 97% 93% 97% 100%    Intake/Output Summary (Last 24 hours) at 06/29/13 1756 Last data filed at 06/29/13 1705  Gross per 24 hour  Intake    250 ml  Output   1795 ml  Net  -1545 ml   Weight change: 0.045 kg (1.6 oz) Exam:   General:  Pt is alert, follows commands appropriately, not in acute distress  HEENT: No icterus, No thrush,[place]/AT  Cardiovascular: RRR, S1/S2, no rubs, no gallops  Respiratory: Bilateral crackles. No wheezing. Good air movement.  Abdomen: Soft/+BS, non tender, non  distended, no guarding  Extremities: No edema, No lymphangitis, No petechiae, No rashes, no synovitis  Data Reviewed: Basic Metabolic Panel:  Recent Labs Lab 06/25/13 0510 06/26/13 0500 06/27/13 0438 06/28/13 0600 06/29/13 0500  NA 138 137 135* 134* 138  K 3.5* 3.9 3.6* 3.3* 3.6*  CL 92* 90* 86* 85* 89*  CO2 29 30 31 31  32  GLUCOSE 99 161* 185* 163* 181*  BUN 80* 88* 93* 97* 99*  CREATININE 3.80* 3.90* 3.89* 4.07* 3.92*  CALCIUM 8.7 9.0 8.8 8.9 9.1  PHOS 7.1* 8.5* 8.1* 8.5* 8.1*   Liver Function Tests:  Recent Labs Lab 06/25/13 0510 06/26/13 0500 06/27/13 0438 06/28/13 0600 06/29/13 0500  ALBUMIN 2.7* 2.9* 2.8* 2.7* 2.7*   No results found for this basename: LIPASE, AMYLASE,  in the last 168 hours No results found for this basename: AMMONIA,  in the last 168 hours CBC:  Recent Labs Lab 06/23/13 0600 06/24/13 1455 06/26/13 1200 06/27/13 0438 06/29/13 0500  WBC 6.1 6.2 8.1 7.7 7.7  HGB 11.5* 10.8* 10.9* 10.4* 10.0*  HCT 35.4* 33.1* 32.6* 32.1* 31.1*  MCV 85.3 83.6 82.3 82.9 83.4  PLT 113* 117* 109* 102* 129*   Cardiac Enzymes: No results found for this basename: CKTOTAL, CKMB, CKMBINDEX, TROPONINI,  in the last 168 hours BNP: No components found with this basename: POCBNP,  CBG:  Recent Labs Lab 06/28/13 2100 06/29/13 0522 06/29/13 1100 06/29/13 1232 06/29/13 1737  GLUCAP 191* 183* 181* 157* 119*    Recent Results (from the past 240 hour(s))  MRSA PCR SCREENING     Status: None   Collection Time    06/28/13 10:18 PM      Result Value Ref Range Status   MRSA by PCR NEGATIVE  NEGATIVE Final   Comment:            The GeneXpert MRSA Assay (FDA     approved for NASAL specimens     only), is one component of a     comprehensive MRSA colonization     surveillance program. It is not     intended to diagnose MRSA     infection nor to guide or     monitor treatment for     MRSA infections.     Scheduled Meds: . atorvastatin  80 mg Oral q1800   . budesonide-formoterol  2 puff Inhalation BID  . calcitRIOL  0.25 mcg Oral Daily  . calcium acetate  1,334 mg Oral TID WC  . clopidogrel  75 mg Oral Q breakfast  . DULoxetine  60 mg Oral Daily  . heparin  5,000 Units Subcutaneous 3 times per day  . insulin aspart  0-9 Units Subcutaneous TID WC  . ketoconazole   Topical BID  . pantoprazole  40 mg Oral Daily  . polyethylene glycol  17 g Oral Daily  . pregabalin  50 mg Oral BID  . senna  2 tablet Oral Daily  . sildenafil  20 mg Oral TID  . sodium chloride  3 mL Intravenous Q12H  . torsemide  40 mg Oral BID   Continuous Infusions: . milrinone 0.25 mcg/kg/min (  06/29/13 0757)     Jackston Oaxaca, DO  [facility] Pager [phone]  If 7PM-7AM, please contact night-coverage [email or web address] Password [facility] 06/29/2013, 5:56 PM   LOS: 13 days

## 2013-06-29 NOTE — Transfer of Care (Signed)
Immediate Anesthesia Transfer of Care Note  Patient: [name]  Procedure(s) Performed: Procedure(s): ARTERIOVENOUS (AV) FISTULA CREATION; ULTRASOUND GUIDED (Left) INSERTION OF DIALYSIS CATHETER; ULTRASOUND GUIDED (Left)  Patient Location: PACU  Anesthesia Type:General  Level of Consciousness: awake, alert  and oriented  Airway & Oxygen Therapy: Patient Spontanous Breathing and Patient connected to face mask oxygen  Post-op Assessment: Report given to PACU RN  Post vital signs: Reviewed and stable  Complications: No apparent anesthesia complications

## 2013-06-29 NOTE — Preoperative (Signed)
Beta Blockers   Reason not to administer Beta Blockers:Not Applicable 

## 2013-06-29 NOTE — Op Note (Signed)
    OPERATIVE REPORT  DATE OF SURGERY: 06/29/2013  PATIENT: [name], 68 y.o. female MRN: [id number]  DOB: [birth year]  PRE-OPERATIVE DIAGNOSIS: End-stage renal disease  POST-OPERATIVE DIAGNOSIS:  Same  PROCEDURE: #1 left IJ hemodialysis catheter with ultrasound visualization, #2 left wrist Cimino radiocephalic fistula  SURGEON:  Gretta Began, M.D.  PHYSICIAN ASSISTANT: Ebony Cargo RN FA  ANESTHESIA:  Gen. LMA  EBL: Minimal ml  Total I/O In: 100 [I.V.:100] Out: 20 [Blood:20]  BLOOD ADMINISTERED: None  DRAINS: None  SPECIMEN: None  COUNTS CORRECT:  YES  PLAN OF CARE: PACU with chest x-ray pending   PATIENT DISPOSITION:  PACU - hemodynamically stable  PROCEDURE DETAILS: The patient was taken to the operating room placed supine position where the area of the left neck was prepped and draped in the usual sterile fashion. The patient had a right IJ triple-lumen catheter for IV medications. The patient was placed in Trendelenburg position. Using SonoSite ultrasound the left internal jugular vein was accessed and a guidewire was placed in the level right atrium this was confirmed with fluoroscopy. A dilator and peel-away sheath was passed over the guidewire and the dilator and guidewire were removed. A 27 cm hemodialysis cath was passed through the peel-away sheath which was removed. The catheter was brought through subcutaneous tunnel through a separate stab incision and the 2 lm ports were attached. Both lumens flushed and aspirated easily and were locked with 1000 unit per cc heparin. The catheter tips were positioned level of the distal right atrium. The catheter was secured to the skin with a 3-0 nylon stitch and the entry site was closed with a 4-0 subcuticular Vicryl stitch.  Attention was then turned to the left arm. SonoSite ultrasound was used to visualize the cephalic vein. The patient had a preop vein map showing that this was of good caliber and patent. The decision was  made between the level of the cephalic vein and the radial artery. The vein was very thin walled. The artery had scarring and then used for a lines in the past. The artery was occluded proximally and distally and was opened with an 11 blade salicylate Potts scissors. A 1-1/2 dilator passed through the artery the 2 dilator would not pass. There was good arterial inflow. The vein was ligated distally divided and mobilized to the level of the radial artery. The vein was cut to appropriate length and spatulated and sewn end-to-side to the radial artery with a 6-0 Prolene suture. Clamps removed and good thrill was noted to the vein. Patient large branch of the cephalic vein just above the area of the anastomosis. This was mobilized and clipped. The wound irrigated with saline and hemostasis was obtained with electrocautery. The wound was closed with 3-0 Vicryl in the subcutaneous and subcuticular tissue. Benzoin and Steri-Strips were applied. The patient was taken to the recovery in stable condition   Gretta Began, M.D. 06/29/2013 10:30 AM

## 2013-06-29 NOTE — Progress Notes (Signed)
PT Cancellation Note  Patient Details Name: [name] MRN: [id number] DOB: November 18, 1945   Cancelled Treatment:    Reason Eval/Treat Not Completed: Patient at procedure or test/unavailable.  In am, patient in OR for HD access placement - AVF.  This pm, patient in HD.  Will return tomorrow for PT session.   Vena Austria 06/29/2013, 3:53 PM Durenda Hurt. Renaldo Fiddler, [facility] Acute Rehab Services Pager [phone]

## 2013-06-29 NOTE — Interval H&P Note (Signed)
History and Physical Interval Note:  06/29/2013 8:06 AM  [name]  has presented today for surgery, with the diagnosis of esrd  The various methods of treatment have been discussed with the patient and family. After consideration of risks, benefits and other options for treatment, the patient has consented to  Procedure(s): ARTERIOVENOUS (AV) FISTULA CREATION  VS GRAFT - LEFT ARM (Left) INSERTION OF DIALYSIS CATHETER (N/A) as a surgical intervention .  The patient's history has been reviewed, patient examined, no change in status, stable for surgery.  I have reviewed the patient's chart and labs.  Questions were answered to the patient's satisfaction.     EARLY, TODD

## 2013-06-29 NOTE — Anesthesia Postprocedure Evaluation (Signed)
  Anesthesia Post-op Note  Patient: [name]  Procedure(s) Performed: Procedure(s): ARTERIOVENOUS (AV) FISTULA CREATION; ULTRASOUND GUIDED (Left) INSERTION OF DIALYSIS CATHETER; ULTRASOUND GUIDED (Left)  Patient Location: PACU  Anesthesia Type:General  Level of Consciousness: awake and sedated  Airway and Oxygen Therapy: Patient Spontanous Breathing  Post-op Pain: none  Post-op Assessment: Post-op Vital signs reviewed, Patient's Cardiovascular Status Stable and Respiratory Function Stable  Post-op Vital Signs: stable  Complications: No apparent anesthesia complications

## 2013-06-29 NOTE — Progress Notes (Signed)
Pt attempted to try to void twice. Once at 2300 and then up at 0130. Pt sleepy but sat on commode several minutes and unable to void.  Bladder scan done and showed 800-1000cc urine. Pt had not voided since 1600 today. [facility] made aware. Foley Cath ordered to be inserted. . Foley Catheter inserted using sterile technique without difficulty. 700 cc urine gotten immediately upon insertion. UA and culture sent as ordered. Will continue to assess.

## 2013-06-29 NOTE — Progress Notes (Signed)
[facility] ROUNDING NOTE   Subjective:   Interval History: planning dialysis this afternoon  Objective:  Vital signs in last 24 hours:  Temp:  [98 F (36.7 C)-98.9 F (37.2 C)] 98 F (36.7 C) (02/23 1200) Pulse Rate:  [57-123] 113 (02/23 1235) Resp:  [13-22] 22 (02/23 1235) BP: (102-135)/(47-79) 135/74 mmHg (02/23 1235) SpO2:  [91 %-97 %] 97 % (02/23 1235) FiO2 (%):  [10 %] 10 % (02/23 1235) Weight:  [71.623 kg (157 lb 14.4 oz)] 71.623 kg (157 lb 14.4 oz) (02/23 0500)  Weight change: 0.045 kg (1.6 oz) Filed Weights   06/27/13 0605 06/28/13 0425 06/29/13 0500  Weight: 71.079 kg (156 lb 11.2 oz) 71.578 kg (157 lb 12.8 oz) 71.623 kg (157 lb 14.4 oz)    Intake/Output: I/O last 3 completed shifts: In: 1586.1 [P.O.:630; I.V.:956.1] Out: 3850 [Urine:3850]   Intake/Output this shift:  Total I/O In: 100 [I.V.:100] Out: 520 [Urine:500; Blood:20]  CVS- RRR RS- CTA diminished air entry ABD- BS present soft non-distended EXT- 1 + edema   Basic Metabolic Panel:  Recent Labs Lab 06/25/13 0510 06/26/13 0500 06/27/13 0438 06/28/13 0600 06/29/13 0500  NA 138 137 135* 134* 138  K 3.5* 3.9 3.6* 3.3* 3.6*  CL 92* 90* 86* 85* 89*  CO2 29 30 31 31  32  GLUCOSE 99 161* 185* 163* 181*  BUN 80* 88* 93* 97* 99*  CREATININE 3.80* 3.90* 3.89* 4.07* 3.92*  CALCIUM 8.7 9.0 8.8 8.9 9.1  PHOS 7.1* 8.5* 8.1* 8.5* 8.1*    Liver Function Tests:  Recent Labs Lab 06/25/13 0510 06/26/13 0500 06/27/13 0438 06/28/13 0600 06/29/13 0500  ALBUMIN 2.7* 2.9* 2.8* 2.7* 2.7*   No results found for this basename: LIPASE, AMYLASE,  in the last 168 hours No results found for this basename: AMMONIA,  in the last 168 hours  CBC:  Recent Labs Lab 06/23/13 0600 06/24/13 1455 06/26/13 1200 06/27/13 0438 06/29/13 0500  WBC 6.1 6.2 8.1 7.7 7.7  HGB 11.5* 10.8* 10.9* 10.4* 10.0*  HCT 35.4* 33.1* 32.6* 32.1* 31.1*  MCV 85.3 83.6 82.3 82.9 83.4  PLT 113* 117* 109* 102* 129*     Cardiac Enzymes: No results found for this basename: CKTOTAL, CKMB, CKMBINDEX, TROPONINI,  in the last 168 hours  BNP: No components found with this basename: POCBNP,   CBG:  Recent Labs Lab 06/28/13 1604 06/28/13 2100 06/29/13 0522 06/29/13 1100 06/29/13 1232  GLUCAP 142* 191* 183* 181* 157*    Microbiology: Results for orders placed during the hospital encounter of 06/16/13  CULTURE, BLOOD (ROUTINE X 2)     Status: None   Collection Time    06/16/13  4:43 PM      Result Value Ref Range Status   Specimen Description BLOOD ARM LEFT   Final   Special Requests BOTTLES DRAWN AEROBIC AND ANAEROBIC 5CCBLUE 4CCRED   Final   Culture  Setup Time     Final   Value: 06/16/2013 22:10     Performed at [facility]   Culture     Final   Value: NO GROWTH 5 DAYS     Performed at [facility]   Report Status 06/22/2013 FINAL   Final  CULTURE, BLOOD (ROUTINE X 2)     Status: None   Collection Time    06/16/13  4:53 PM      Result Value Ref Range Status   Specimen Description BLOOD ARM RIGHT   Final   Special Requests BOTTLES  DRAWN AEROBIC AND ANAEROBIC 10CC   Final   Culture  Setup Time     Final   Value: 06/16/2013 22:10     [facility]   Culture     Final   Value: NO GROWTH 5 DAYS     [facility]formed at [facility]   Report Status 06/22/2013 FINAL   Final  URINE CULTURE     Status: None   Collection Time    06/18/13  6:34 PM      Result Value Ref Range Status   Specimen Description URINE, RANDOM   Final   Special Requests NONE   Final   Culture  Setup Time     Final   Value: 06/18/2013 19:27     Performed at [facility]   Colony Count     Final   Value: >=100,000 COLONIES/ML     Performed at [facility]   Culture     Final   Value: ESCHERICHIA COLI     Performed at [facility]   Report Status 06/20/2013 FINAL   Final   Organism ID, Bacteria ESCHERICHIA COLI   Final  MRSA PCR SCREENING     Status:  None   Collection Time    06/28/13 10:18 PM      Result Value Ref Range Status   MRSA by PCR NEGATIVE  NEGATIVE Final   Comment:            The GeneXpert MRSA Assay (FDA     approved for NASAL specimens     only), is one component of a     comprehensive MRSA colonization     surveillance program. It is not     intended to diagnose MRSA     infection nor to guide or     monitor treatment for     MRSA infections.    Coagulation Studies: No results found for this basename: LABPROT, INR,  in the last 72 hours  Urinalysis:  Recent Labs  06/29/13 0209  COLORURINE YELLOW  LABSPEC 1.013  PHURINE 5.0  GLUCOSEU NEGATIVE  HGBUR NEGATIVE  BILIRUBINUR NEGATIVE  KETONESUR NEGATIVE  PROTEINUR NEGATIVE  UROBILINOGEN 0.2  NITRITE NEGATIVE  LEUKOCYTESUR NEGATIVE      Imaging: Dg Chest 2 View  06/28/2013   CLINICAL DATA:  CHF, dyspnea, history bronchitis, diabetes, hypertension, coronary artery disease post CABG  EXAM: CHEST  2 VIEW  COMPARISON:  06/24/2013  FINDINGS: Enlargement of cardiac silhouette post CABG.  Right jugular central venous catheter with tip projecting over SVC.  Atherosclerotic calcification aorta.  Mediastinal contour stable.  Improved pulmonary edema and left basilar opacity since previous exam.  No gross pleural effusion or pneumothorax.  Bones demineralized.  IMPRESSION: Enlargement of cardiac silhouette post CABG.  Improved pulmonary edema and left basilar opacity since previous study.   Electronically Signed   By: Ulyses SouthwardMark  Boles M.D.   On: 06/28/2013 18:52   Dg Chest Port 1 View  06/29/2013   CLINICAL DATA:  Postoperative film, status post line placement.  EXAM: PORTABLE CHEST - 1 VIEW  COMPARISON:  DG CHEST 2 VIEW dated 06/28/2013  FINDINGS: Since the previous study there has been interval placement of a large caliber dual lumen catheter via the left internal jugular approach. The tip of this catheter lies in the region of the right atrium. The right internal jugular  venous catheter tip lies in the midportion of the SVC. The cardiopericardial silhouette remains enlarged. Its  borders are less distinct today. The pulmonary interstitial markings remain mildly increased. The left hemidiaphragm is partially obscured. No definite pleural effusions are demonstrated. The patient has undergone previous CABG.  IMPRESSION: 1. There has been interval placement of a dual-lumen, large caliber catheter via the left internal jugular approach with the tip of the distal port lying in the region of the right atrium. 2. The pulmonary interstitial markings are somewhat more conspicuous today which may reflect interstitial edema or pneumonia. 3. The cardiopericardial silhouette remains enlarged and the pulmonary vascularity remains engorged centrally consistent with mild CHF.   Electronically Signed   By: David  [place]   On: 06/29/2013 11:33     Medications:   . milrinone 0.25 mcg/kg/min (06/29/13 0757)   . [facility] HOLD] atorvastatin  80 mg Oral q1800  . [MAR HOLD] budesonide-formoterol  2 puff Inhalation BID  . [facility] HOLD] calcitRIOL  0.25 mcg Oral Daily  . [facility] HOLD] calcium acetate  1,334 mg Oral TID WC  . [facility] HOLD] clopidogrel  75 mg Oral Q breakfast  . [MAR HOLD] DULoxetine  60 mg Oral Daily  . [MAR HOLD] heparin  5,000 Units Subcutaneous 3 times per day  . [MAR HOLD] insulin aspart  0-9 Units Subcutaneous TID WC  . [MAR HOLD] ketoconazole   Topical BID  . [MAR HOLD] pantoprazole  40 mg Oral Daily  . [MAR HOLD] polyethylene glycol  17 g Oral Daily  . [facility] HOLD] pregabalin  50 mg Oral BID  . [facility] HOLD] senna  2 tablet Oral Daily  . [facility] HOLD] sildenafil  20 mg Oral TID  . [MAR HOLD] sodium chloride  3 mL Intravenous Q12H  . [facility] HOLD] torsemide  40 mg Oral BID   sodium chloride, [MAR HOLD] albuterol, [MAR HOLD] ALPRAZolam, fentaNYL, [MAR HOLD] HYDROcodone-acetaminophen, [MAR HOLD] sodium chloride  Assessment/ Plan:  This is a 68 y.o. year old female with significant prior  vascular history including peripheral vascular disease status post femoropopliteal bypass with claudication, coronary artery disease status post multiple MIs status post CABG, CVA, type 2 diabetes, chronic renal insufficiency with a baseline creatinine of 2.7 presenting with cellulitis and acute kidney injury. Creatinine has now progressed to 3.7.Ms Coryell is a 68 yo female with a history of CAD s/p CABG (2007), ICM, chronic systolic HF EF ~25%, HTN, DM2, PVD s/p R fem-pop (12/2012) complicated by necrotic anterior abdominal wall fascia and lateral soft tissues and CKD stage III-IV. Last myoview was 10/2011 which was negative for ischemia. EF 31% with old antero-apical MI  1. New start ESRD  Formally followed by Dr Kathrene Bongo  Nephrosclerosis , Cardiorenal 15% Pulmonary HTN and obesisty  M spike negative  2.Anemia  Hb 10  3. Bones  PTH  458.5  4. Dm  Hb a1c  7.9  5. Access  Placed AVF  2/23      LOS: 13 [name] W @TODAY @12 :40 PM

## 2013-06-29 NOTE — H&P (View-Only) (Signed)
[facility]ascular [facility]in [facility] Consult  Reason for Consult:  Permanent HD access placement Referring Physician:  Colodanoto MRN #:  [id number]  History of Present Illness: This is a 68 y.o. female who is known to our practice as she underwent a right femoral to popliteal bypass 01/20/13.  She returned to the office 10 days later with swelling in her right groin and redness.  She went to the OR for I&D and wound vac placement 02/06/13.  She had chronic renal failure at that time.  Dr. Darrick Penna was concerned with her nutritional status for wound healing and a kangaroo tube was placed for TF.  At that time, renal was consulted for hyperkalemia.  She had been started on an ARB (not sure of reason per note), but this was discontinued.    At her last office visit 04/23/13, she continued to have slow wound healing with a 3x3 wound.  Her vac was switched to hydrogel daily.  She was to f/u in one month. She states that her groin wound has healed.  She also states that she was walking with a cane, but is now back to a wheelchair and walker.  She states that her left leg doesn't want to work, but she is making it.  She states she was enjoying her right leg working again.    ABI's on 06/19/13 are as follows:  RIGHT    LEFT     PRESSURE  WAVEFORM   PRESSURE  WAVEFORM   BRACHIAL  147  Tri  BRACHIAL  137  Tri   DP    DP     AT  144  Bi  AT  80  Mono   PT  154  Bi  PT  67  Damp mono   PER    PER     GREAT TOE   NA  GREAT TOE   NA     RIGHT  LEFT   ABI  1.05, within normal limits  0.54, moderate disease    She presented to the hospital on 06/16/13 with cellulitis and a painful ulcer on her left shin.  She noticed a small area on her left shin about 7-10 days prior to admission.  During evaluation, she was found to have acute kidney injury.  She is in need of permanent access and VVS is consulted.  She also has chronic CHF with last EF 15-20% in 2013.  She has had ~ 30 lb weight gain over the past 3-4  months and edema and a sore over her left shin, which was very painful to touch and red around it.  She states this has improved significantly.  She denies any CP or SOB.    She is on a statin for her cholesterol.  She is on insulin for her DM.     Past Medical History  Diagnosis Date  . Coronary artery disease     a. s/p CABG 2007 (L-RI, S-dRCA, S-LAD, S-OM);  b. myoview 6/13:  anteroapical MI, no ischemia, EF 31%  . Diabetes mellitus   . Hypertension   . Asthma   . Bronchitis   . Reflux   . Hyperlipidemia   . Chronic systolic heart failure     a. echo 6/13: mild LVH, EF 15-20%, diff HK, Gr 1 DD, mild reduced RVSF, PASP 32.  Marland Kitchen GERD (gastroesophageal reflux disease)   . Arthritis   . CKD (chronic kidney disease)   . Chronic back pain   . Hypoxia  a. Adm 09/2012 - discharged with home O2 in setting of CHF.  Marland Kitchen Abnormal stress test     a. Nuc 10/2011: bright target which may be in left breast or L chest wall or axilla. Pt reports she f/u with PCP and mammogram was OK (left breast cyst).  . Complication of anesthesia     slow to awaken  . 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 Anginal pain     "rarely, had some 2 months ago, not sure if heart or arm, cardiologist aware.  . CHF (congestive heart failure)   . Shortness of breath     at times- sensitive to perfume.  . Peripheral vascular disease   . Depression   . Stroke 05/2011    a. R ischemic CVA 05/2011. Right hand weakness.  . Pneumonia     2010ish  . Neuropathic pain     feet and hands  . Polio     as a child  . Altered level of consciousness     2 weeks now  . Cellulitis 06/2013    LEFT LOWER EXTREMITY   Past Surgical History  Procedure Laterality Date  . Coronary artery bypass graft      LIMA to RI, SVG to distal RCA, SVG to LAD, SVG to OM. 2007  . Joint replacement      Right hip replacement   . Debridement skin / sq / muscle of trunk      s/p staph infection from CABG 2007  . Cystectomy       Subcutaneous  . Partial hip arthroplasty    . Stroke    . Femoral-popliteal bypass graft Right 01/20/2013    Procedure: BYPASS GRAFT RIGHT FEMORAL-BELOW KNEE POPLITEAL ARTERY WITH RINGED GORTEX GRAFT ;  Surgeon: Sherren Kerns, MD;  Location: [facility] OR;  Service: Vascular;  Laterality: Right;  . Patch angioplasty Right 01/20/2013    Procedure: VEIN  PATCH ANGIOPLASTY;  Surgeon: Sherren Kerns, MD;  Location: [facility] OR;  Service: Vascular;  Laterality: Right;  . I&d extremity Right 02/06/2013    Procedure: DEBRIDEMENT RIGHT GROIN WOUND & APPLICATION OF WOUND VAC.;  Surgeon: Sherren Kerns, MD;  Location: [facility] OR;  Service: Vascular;  Laterality: Right;  . Groin debridement Right 02/11/2013    Procedure: GROIN DEBRIDEMENT & WOUND VAC APPLICATION;  Surgeon: Sherren Kerns, MD;  Location: [facility] OR;  Service: Vascular;  Laterality: Right;  . I&d extremity Right 02/23/2013    Procedure: IRRIGATION AND DEBRIDEMENT RIGHT GROIN;  Surgeon: Sherren Kerns, MD;  Location: [facility] OR;  Service: Vascular;  Laterality: Right;  . Application of wound vac Right 02/23/2013    Procedure: APPLICATION OF WOUND VAC;  Surgeon: Sherren Kerns, MD;  Location: [facility] OR;  Service: Vascular;  Laterality: Right;  . Hematoma evacuation Right 02/12/2013    Procedure: EVACUATION HEMATOMA;  Surgeon: Fransisco Hertz, MD;  Location: [facility] OR;  Service: Vascular;  Laterality: Right;    Allergies  Allergen Reactions  . Sulfa Antibiotics Anaphylaxis and Shortness Of Breath  . Codeine Nausea Only and Other (See Comments)    Severe headache  . Ivp Dye [Iodinated Diagnostic Agents] Hives and Other (See Comments)    Severe anxiety  . Penicillins Swelling and Other (See Comments)    "huge sores". Pt has tolerated both Zosyn and Keflex in the past.    Prior to Admission medications   Medication Sig Start Date End Date Taking? Authorizing Provider  albuterol (  PROVENTIL HFA;VENTOLIN HFA) 108 (90 BASE) MCG/ACT inhaler Inhale 2 puffs into the lungs  every 6 (six) hours as needed for wheezing or shortness of breath.    Yes Historical Provider, MD  ALPRAZolam Prudy Feeler) 0.5 MG tablet Take 0.5 mg by mouth daily as needed for anxiety.    Yes Historical Provider, MD  budesonide-formoterol (SYMBICORT) 160-4.5 MCG/ACT inhaler Inhale 2 puffs into the lungs 2 (two) times daily.    Yes Historical Provider, MD  clopidogrel (PLAVIX) 75 MG tablet Take 75 mg by mouth daily with breakfast.   Yes Historical Provider, MD  diphenhydrAMINE (BENADRYL) 25 mg capsule Take 25 mg by mouth every 6 (six) hours as needed for allergies.    Yes Historical Provider, MD  DULoxetine (CYMBALTA) 60 MG capsule Take 60 mg by mouth daily.   Yes Historical Provider, MD  insulin glargine (LANTUS) 100 UNIT/ML injection Inject 28 Units into the skin at bedtime.    Yes Historical Provider, MD  insulin lispro (HUMALOG) 100 UNIT/ML injection Inject 6-11 Units into the skin 3 (three) times daily before meals. Per sliding scale   Yes Historical Provider, MD  nitroGLYCERIN (NITROSTAT) 0.4 MG SL tablet Place 0.4 mg under the tongue every 5 (five) minutes as needed for chest pain.    Yes Historical Provider, MD  pantoprazole (PROTONIX) 40 MG tablet Take 40 mg by mouth daily.   Yes Historical Provider, MD  pregabalin (LYRICA) 50 MG capsule Take 50 mg by mouth 2 (two) times daily.   Yes Historical Provider, MD  rosuvastatin (CRESTOR) 40 MG tablet Take 40 mg by mouth daily.   Yes Historical Provider, MD  torsemide (DEMADEX) 20 MG tablet Take 20 mg by mouth 2 (two) times daily. 2 tab daily by mouth 02/27/13  Yes Lars Mage, PA-C  ciprofloxacin (CIPRO) 750 MG tablet Take 750 mg by mouth daily with breakfast. Finished sometime in January per patient 03/19/13   Sherren Kerns, MD    History   Social History  . Marital Status: Divorced    Spouse Name: N/A    Number of Children: 4  . Years of Education: N/A   Occupational History  . Retired  [employer]    [employer]   Social History Main  Topics  . Smoking status: Former Smoker -- 0.00 packs/day for 50 years    Types: Cigarettes    Quit date: 01/06/2013  . Smokeless tobacco: Never Used  . Alcohol Use: No  . Drug Use: No  . Sexual Activity: Not on file   Other Topics Concern  . Not on file   Social History Narrative   Lives with grandson 63 years old.     Family History  Problem Relation Age of Onset  . Coronary artery disease Father 99    Unclear if it was an MI  . Stroke Mother 64  . Coronary artery disease Brother 19  . Coronary artery disease Brother 68    ROS: [x]  Positive   [ ]  Negative   [ ]  All sytems reviewed and are negative  Cardiovascular: [x]  chest pain/pressure []  palpitations []  SOB lying flat []  DOE []  pain in legs while walking []  pain in legs at rest []  pain in legs at night [x]  ulcer left shin []  hx of DVT [x]  swelling in legs  Pulmonary: []  productive cough []  asthma/wheezing []  home O2  Neurologic: []  weakness in []  arms []  legs []  numbness in []  arms []  legs [x]  hx of CVA with right arm  weakness that has resolved.[]  mini stroke [] difficulty speaking or slurred speech []  temporary loss of vision in one eye []  dizziness  Hematologic: []  hx of cancer []  bleeding problems []  problems with blood clotting easily  Endocrine:   [x]  diabetes []  thyroid disease  GI []  vomiting blood []  blood in stool [x]  GERD  GU: [x]  CKD/renal failure []  HD--[]  M/W/F or []  T/T/S []  burning with urination []  blood in urine  Psychiatric: []  anxiety [x]  depression  Musculoskeletal: [x]  arthritis []  joint pain  Integumentary: []  rashes [x]  ulcers  Constitutional: []  fever []  chills   Physical Examination  Filed Vitals:   06/26/13 0538  BP: 126/72  Pulse: 113  Temp: 98.6 F (37 C)  Resp: 18   Body mass index is 30.74 kg/(m^2).  General:  WDWN in NAD Gait: Not observed HENT: WNL, normocephalic Eyes: Pupils equal Pulmonary: normal non-labored breathing, without  Rales, rhonchi,  wheezing Cardiac: regular without  Murmurs, rubs or gallops; without carotid bruits on the left.  There is central line in place on the right. Abdomen: soft, NT/ND, no masses Skin: without rashes, with ulcers  Vascular Exam/Pulses:+ palpable radial pulses bilaterally; + palpable femoral pulses bilaterally; + palpable right DP; pedal pulses in right leg are not palpable. Extremities: without ischemic changes, without Gangrene , without cellulitis; without open wounds; dry, pre tibial ulcer bilaterally; there is a small ulcer on the right 2nd toe Musculoskeletal: no muscle wasting or atrophy  Neurologic: A&O X 3; Appropriate Affect ; SENSATION: normal; MOTOR FUNCTION:  moving all extremities equally. Speech is fluent/normal   CBC    Component Value Date/Time   WBC 6.2 06/24/2013 1455   WBC 9.2 12/19/2012 1601   RBC 3.96 06/24/2013 1455   RBC 4.66 12/19/2012 1601   HGB 10.8* 06/24/2013 1455   HGB 13.7 12/19/2012 1601   HCT 33.1* 06/24/2013 1455   HCT 42.9 12/19/2012 1601   PLT 117* 06/24/2013 1455   MCV 83.6 06/24/2013 1455   MCV 92.0 12/19/2012 1601   MCH 27.3 06/24/2013 1455   MCH 29.4 12/19/2012 1601   MCHC 32.6 06/24/2013 1455   MCHC 31.9 12/19/2012 1601   RDW 15.8* 06/24/2013 1455   LYMPHSABS 1.6 06/16/2013 1915   MONOABS 0.4 06/16/2013 1915   EOSABS 0.2 06/16/2013 1915   BASOSABS 0.0 06/16/2013 1915    BMET    Component Value Date/Time   NA 137 06/26/2013 0500   K 3.9 06/26/2013 0500   CL 90* 06/26/2013 0500   CO2 30 06/26/2013 0500   GLUCOSE 161* 06/26/2013 0500   BUN 88* 06/26/2013 0500   CREATININE 3.90* 06/26/2013 0500   CALCIUM 9.0 06/26/2013 0500   GFRNONAA 11* 06/26/2013 0500   GFRAA 13* 06/26/2013 0500    COAGS: Lab Results  Component Value Date   INR 1.11 06/25/2013   INR 1.17 06/23/2013   INR 1.09 01/13/2013     Non-Invasive Vascular Imaging:    Vein mapping 06/24/13:  Cephalic - Right Segment  Diameter  Depth  Comment   1. Axilla  4.7956mm     2. Mid upper  arm  5.688mm     3. Above AC  5.6902mm     4. In [facility]  6.2621mm     5. Below AC  4.2223mm   Multiple branches   6. Mid forearm  2.7665mm   Branch   7. Wrist    Unable to visualize due to IV placement.  Cephalic - left Segment  Diameter  Depth  Comment   1. Axilla  4.36mm     2. Mid upper arm  4.24mm     3. Above [facility]  4.47mm     4. In [facility]  5.41mm     5. Below AC  4.56mm     6. Mid forearm  5.44mm     7. Wrist  2.45mm        Statin:  yes Beta Blocker:  no Aspirin:  no ACEI:  no ARB:  no Other antiplatelets/anticoagulants:  yes-Plavix   ASSESSMENT: This is a 68 y.o. female with AKI/CKD in need of HD permanent access.  She is right hand dominant.  PLAN: -will plan on a left radiocephalic vs left brachiocephalic AVF on Monday.  Since there is no urgent need for HD at this time, will not place diatek at this time. -will have them place restricted extremity on left arm. -NPO after MN on Sunday for procedure Monday morning   Doreatha Massed, PA-C [facility] [phone]  History and exam details as above Left arm AV fistula by Dr Arbie Cookey on Monday  Fabienne Bruns, MD [facility] [place] Office: [phone] Pager: [phone]

## 2013-06-29 NOTE — Progress Notes (Signed)
Dr. Jacklynn Bue notified of patient's increased oxygen requirements. Unable to wean to nasal cannula to maintain sats > 90%. Gave ok to transfer patient to floor.

## 2013-06-29 NOTE — Anesthesia Preprocedure Evaluation (Signed)
Anesthesia Evaluation  Patient identified by MRN, date of birth, ID bandGeneral Assessment Comment:Sedated. Responds to stimulation.  Reviewed: Allergy & Precautions, H&P , NPO status , Patient's Chart, lab work & pertinent test results, reviewed documented beta blocker date and time   History of Anesthesia Complications (+) PROLONGED EMERGENCE and history of anesthetic complications  Airway       Dental no notable dental hx. (+) Edentulous Upper, Partial Lower, Dental Advisory Given   Pulmonary shortness of breath, asthma , COPDformer smoker,  + rhonchi   Pulmonary exam normal       Cardiovascular hypertension, On Home Beta Blockers and On Medications + CAD, + Past MI, + Peripheral Vascular Disease and +CHF Rhythm:Regular Rate:Normal     Neuro/Psych PSYCHIATRIC DISORDERS CVA    GI/Hepatic Neg liver ROS, GERD-  Medicated and Controlled,  Endo/Other  diabetes, Type 1, Insulin Dependent  Renal/GU      Musculoskeletal   Abdominal   Peds  Hematology negative hematology ROS (+)   Anesthesia Other Findings   Reproductive/Obstetrics negative OB ROS                           Anesthesia Physical Anesthesia Plan  ASA: IV  Anesthesia Plan: General   Post-op Pain Management:    Induction: Intravenous  Airway Management Planned: LMA  Additional Equipment:   Intra-op Plan:   Post-operative Plan: Extubation in OR  Informed Consent: I have reviewed the patients History and Physical, chart, labs and discussed the procedure including the risks, benefits and alternatives for the proposed anesthesia with the patient or authorized representative who has indicated his/her understanding and acceptance.     Plan Discussed with: CRNA and Surgeon  Anesthesia Plan Comments:         Anesthesia Quick Evaluation

## 2013-06-30 ENCOUNTER — Encounter (HOSPITAL_COMMUNITY): Payer: Self-pay | Admitting: Vascular Surgery

## 2013-06-30 DIAGNOSIS — R5381 Other malaise: Secondary | ICD-10-CM

## 2013-06-30 DIAGNOSIS — Z515 Encounter for palliative care: Secondary | ICD-10-CM

## 2013-06-30 DIAGNOSIS — R5383 Other fatigue: Secondary | ICD-10-CM

## 2013-06-30 LAB — HEPARIN INDUCED THROMBOCYTOPENIA PNL
HEPARIN INDUCED PLT AB: NEGATIVE
Patient O.D.: 0.134
UFH High Dose UFH H: 1 % Release
UFH LOW DOSE 0.1 IU/ML: 3 %
UFH Low Dose 0.5 IU/mL: 3 % Release
UFH SRA RESULT: NEGATIVE

## 2013-06-30 LAB — URINE CULTURE
Colony Count: NO GROWTH
Culture: NO GROWTH

## 2013-06-30 LAB — RENAL FUNCTION PANEL
Albumin: 2.7 g/dL — ABNORMAL LOW (ref 3.5–5.2)
BUN: 59 mg/dL — AB (ref 6–23)
CALCIUM: 9.2 mg/dL (ref 8.4–10.5)
CO2: 29 mEq/L (ref 19–32)
Chloride: 90 mEq/L — ABNORMAL LOW (ref 96–112)
Creatinine, Ser: 2.98 mg/dL — ABNORMAL HIGH (ref 0.50–1.10)
GFR calc Af Amer: 18 mL/min — ABNORMAL LOW (ref >90)
GFR calc non Af Amer: 15 mL/min — ABNORMAL LOW (ref >90)
GLUCOSE: 163 mg/dL — AB (ref 70–99)
POTASSIUM: 4.3 meq/L (ref 3.7–5.3)
Phosphorus: 7 mg/dL — ABNORMAL HIGH (ref 2.3–4.6)
Sodium: 136 mEq/L — ABNORMAL LOW (ref 137–147)

## 2013-06-30 LAB — GLUCOSE, CAPILLARY
Glucose-Capillary: 226 mg/dL — ABNORMAL HIGH (ref 70–99)
Glucose-Capillary: 175 mg/dL — ABNORMAL HIGH (ref 70–99)
Glucose-Capillary: 201 mg/dL — ABNORMAL HIGH (ref 70–99)
Glucose-Capillary: 139 mg/dL — ABNORMAL HIGH (ref 70–99)

## 2013-06-30 MED ORDER — BISACODYL 10 MG RE SUPP: 10.0000 mg | Freq: Every day | RECTAL | Status: DC | PRN

## 2013-06-30 NOTE — Progress Notes (Signed)
[facility] [place] Postoperative hemodialysis access   Date of Surgery:  06/29/13 Surgeon: Early  Subjective:  Sore at wrist  PHYSICAL EXAMINATION:  Filed Vitals:   06/30/13 0853  BP: 121/67  Pulse: 123  Temp:   Resp:     Incision is c/d/i with steri strips in place Hand grip is in tact  Sensation in digits is intact;  There is not  Thrill  There is bruit. The graft/fistula is not palpable  The left radial pulse is not palpable; left hand is warm and sensation/motor is in tact.   ASSESSMENT/PLAN:  [name] is a 68 y.o. year old female who is s/p #1 left IJ hemodialysis catheter with ultrasound visualization, #2 left wrist Cimino radiocephalic fistula.  -I cannot palpate a thrill, but there is a bruit within the fistula.  If this clots off, will need left brachiocephalic AVF -pt does not have evidence of steal sx -f/u with Dr. Arbie Cookey in 4-6 weeks to check maturation of AVF -will sign off-call as needed.   Doreatha Massed, PA-C [facility] [phone]

## 2013-06-30 NOTE — Progress Notes (Addendum)
Advanced Heart Failure Team Consult Note  Referring Physician: Dr. Arrie Aranoladonato Primary Physician: Dr. Hale BogusMara Primary Cardiologist:  Dr. Elease HashimotoNahser  Reason for Consultation: Heart Failure   HPI:    [name] is a 68 yo female with a history of CAD s/p CABG (2007), ICM, chronic systolic HF EF ~25%, HTN, DM2, PVD s/p R fem-pop (12/2012) complicated by necrotic anterior abdominal wall fascia and lateral soft tissues and CKD stage III-IV. Last myoview was 10/2011 which was negative for ischemia. 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 Over the past 6 months reports a marked decrease in functional capacity. She has had 3 readmissions in the past 6 months. Complaints of increased weight gain ~ 30 lbs over past 3-4 months, SOB, CP and edema. Her kidney function has continued to decline as well. Has had several episodes of exertional chest pressure recently.   Baseline CR 5/14 Cr 1.4 9/14 Cr 1.4 10/14 Episode acute renal failure 4.5->2.1 2/15  Admit Cr 3.77-> 4.01-> 3.9  RHC 06/24/13 RA = 16  RV = 65/7/17  PA = 70/34 (47)  PCW = 17  Fick cardiac output/index = 3.4/1.9  Thermo CO/CI = 2.9/1.7  PVR = 10 WU  FA sat = 95%  PA sat = 50%, 51%  FEV1 1.04L (52%) FVC    1.52L (58%) FEV1/FVC 68% FEF 25-75%  0.50L (27%)DLCO 29%   Was started on IV milrinone and revatio 20 mg TID after cath. VQ scan yesterday low probability for PE. Went for PFTs and was not able to complete d/t she had emesis. S/P AV fistula. Yesterday she started HD.   Complains fatigue. Complains of L forearm pain.   Creatinine 3.9>2.98   Objective:    Vital Signs:   Temp:  [97 F (36.1 C)-99.2 F (37.3 C)] 97.9 F (36.6 C) (02/24 0500) Pulse Rate:  [57-118] 106 (02/24 0500) Resp:  [13-23] 20 (02/24 0500) BP: (96-135)/(38-74) 116/69 mmHg (02/24 0500) SpO2:  [90 %-100 %] 92 % (02/24 0500) FiO2 (%):  [10 %-50 %] 40  % (02/23 1412) Weight:  [157 lb 4.8 oz (71.351 kg)-158 lb 15.2 oz (72.1 kg)] 157 lb 4.8 oz (71.351 kg) (02/24 0500) Last BM Date: 06/28/13  Weight change: Filed Weights   06/29/13 1432 06/29/13 1705 06/30/13 0500  Weight: 158 lb 15.2 oz (72.1 kg) 158 lb 15.2 oz (72.1 kg) 157 lb 4.8 oz (71.351 kg)    Intake/Output:   Intake/Output Summary (Last 24 hours) at 06/30/13 0749 Last data filed at 06/30/13 0600  Gross per 24 hour  Intake  289.6 ml  Output    970 ml  Net -680.4 ml     Physical Exam: General:  Elderly. Lying in bed No resp difficulty HEENT: normal Neck: supple. JVP 6-7 . Carotids 2+ bilat; no bruits. No lymphadenopathy or thryomegaly appreciated. L temp HD cath R central line.  Cor: PMI nondisplaced. Regular rate & rhythm. No rubs, gallops or murmurs. Lungs: Course through out.  Abdomen: soft, nontender, + distended. No hepatosplenomegaly. No bruits or masses. Good bowel sounds. Extremities: no cyanosis, clubbing, rash, edema. L forearm dressing CDI.  Neuro: alert & orientedx3, cranial nerves grossly intact. moves all 4 extremities w/o difficulty. Affect pleasant   Labs: Basic Metabolic Panel:  Recent Labs Lab 06/26/13 0500 06/27/13 0438 06/28/13 0600 06/29/13 0500 06/30/13 0410  NA 137 135* 134* 138 136*  K 3.9 3.6* 3.3* 3.6*  4.3  CL 90* 86* 85* 89* 90*  CO2 30 31 31  32 29  GLUCOSE 161* 185* 163* 181* 163*  BUN 88* 93* 97* 99* 59*  CREATININE 3.90* 3.89* 4.07* 3.92* 2.98*  CALCIUM 9.0 8.8 8.9 9.1 9.2  PHOS 8.5* 8.1* 8.5* 8.1* 7.0*    Liver Function Tests:  Recent Labs Lab 06/26/13 0500 06/27/13 0438 06/28/13 0600 06/29/13 0500 06/30/13 0410  ALBUMIN 2.9* 2.8* 2.7* 2.7* 2.7*   No results found for this basename: LIPASE, AMYLASE,  in the last 168 hours No results found for this basename: AMMONIA,  in the last 168 hours  CBC:  Recent Labs Lab 06/24/13 1455 06/26/13 1200 06/27/13 0438 06/29/13 0500  WBC 6.2 8.1 7.7 7.7  HGB 10.8* 10.9*  10.4* 10.0*  HCT 33.1* 32.6* 32.1* 31.1*  MCV 83.6 82.3 82.9 83.4  PLT 117* 109* 102* 129*    Cardiac Enzymes: No results found for this basename: CKTOTAL, CKMB, CKMBINDEX, TROPONINI,  in the last 168 hours  BNP: BNP (last 3 results)  Recent Labs  09/25/12 1743 09/29/12 0530  PROBNP 16961.0* 5247.0*    CBG:  Recent Labs Lab 06/29/13 1100 06/29/13 1232 06/29/13 1737 06/29/13 2044 06/30/13 0635  GLUCAP 181* 157* 119* 121* 226*    Coagulation Studies: No results found for this basename: LABPROT, INR,  in the last 72 hours   Imaging: Dg Chest 2 View  06/28/2013   CLINICAL DATA:  CHF, dyspnea, history bronchitis, diabetes, hypertension, coronary artery disease post CABG  EXAM: CHEST  2 VIEW  COMPARISON:  06/24/2013  FINDINGS: Enlargement of cardiac silhouette post CABG.  Right jugular central venous catheter with tip projecting over SVC.  Atherosclerotic calcification aorta.  Mediastinal contour stable.  Improved pulmonary edema and left basilar opacity since previous exam.  No gross pleural effusion or pneumothorax.  Bones demineralized.  IMPRESSION: Enlargement of cardiac silhouette post CABG.  Improved pulmonary edema and left basilar opacity since previous study.   Electronically Signed   By: Ulyses Southward M.D.   On: 06/28/2013 18:52   Dg Chest Port 1 View  06/29/2013   CLINICAL DATA:  Postoperative film, status post line placement.  EXAM: PORTABLE CHEST - 1 VIEW  COMPARISON:  DG CHEST 2 VIEW dated 06/28/2013  FINDINGS: Since the previous study there has been interval placement of a large caliber dual lumen catheter via the left internal jugular approach. The tip of this catheter lies in the region of the right atrium. The right internal jugular venous catheter tip lies in the midportion of the SVC. The cardiopericardial silhouette remains enlarged. Its borders are less distinct today. The pulmonary interstitial markings remain mildly increased. The left hemidiaphragm is partially  obscured. No definite pleural effusions are demonstrated. The patient has undergone previous CABG.  IMPRESSION: 1. There has been interval placement of a dual-lumen, large caliber catheter via the left internal jugular approach with the tip of the distal port lying in the region of the right atrium. 2. The pulmonary interstitial markings are somewhat more conspicuous today which may reflect interstitial edema or pneumonia. 3. The cardiopericardial silhouette remains enlarged and the pulmonary vascularity remains engorged centrally consistent with mild CHF.   Electronically Signed   By: David  [place]   On: 06/29/2013 11:33     Medications:     Current Medications: . atorvastatin  80 mg Oral q1800  . budesonide-formoterol  2 puff Inhalation BID  . calcitRIOL  0.25 mcg Oral Daily  . calcium acetate  1,334  mg Oral TID WC  . clopidogrel  75 mg Oral Q breakfast  . DULoxetine  60 mg Oral Daily  . heparin  5,000 Units Subcutaneous 3 times per day  . insulin aspart  0-9 Units Subcutaneous TID WC  . ketoconazole   Topical BID  . pantoprazole  40 mg Oral Daily  . polyethylene glycol  17 g Oral Daily  . pregabalin  50 mg Oral BID  . senna  2 tablet Oral Daily  . sildenafil  20 mg Oral TID  . sodium chloride  3 mL Intravenous Q12H  . torsemide  40 mg Oral BID    Infusions: . milrinone 0.25 mcg/kg/min (06/30/13 0032)     Assessment:   1) A/C systolic HF - EF 78-29% (2013) 2) Acute on chronic renal failure, stage IV 3) DM2 4) HTN 5) PVD - R fem-pop (12/2012) 6) CAD - s/p CABG 2007  Plan/Discussion:    Taken to cath lab 2/18 for RHC which showed low CO, mod PAH with elevated PVR and equalization of RV and LV filling pressures. She was started on milrinone and revatio 20 mg TID. Pending SPEP/UPEP. Will need sleep study on outpatient side.  VQ scan showed low probability of PE. PFTs show significant restrictive/obstructive physiology.   Yesterday she day she started HD. Will likely need  to stop torsemide, milrinone, and revatio soon. Likely have BP limitations.   CLEGG,AMY NP-C 7:49 AM  Patient seen with NP.  Discussed situation with Dr. Hyman Hopes.  Patient did poorly with hemodialysis yesterday, was hypotensive and unable to pull off any fluid.  She remains dyspneic with any exertion.    I am not optimistic about our ability to improve her outcomes with dialysis at this point and think consideration of palliative care approach would be appropriate.  If she continues to tolerate HD poorly, this will likely be the necessary outcome. I am going to continue torsemide given volume overload and inability to pull fluid off at HD though not having much effect.  Will titrate off milrinone given ESRD.   Marca Ancona 06/30/2013 9:10 AM  Went by to speak with patient and family this evening.  Patient's son very agitated about palliative care involvement.  This was discussed extensively with patient's daughter earlier in the day, who agreed to this approach.   Marca Ancona 06/30/2013 5:46 PM

## 2013-06-30 NOTE — Progress Notes (Signed)
[facility] ROUNDING NOTE   Subjective:   Interval History: poorly tolerated dialysis still dyspneic and weak   Objective:  Vital signs in last 24 hours:  Temp:  [97 F (36.1 C)-99.2 F (37.3 C)] 97.9 F (36.6 C) (02/24 0500) Pulse Rate:  [81-123] 123 (02/24 0853) Resp:  [19-23] 20 (02/24 0500) BP: (96-124)/(38-69) 121/67 mmHg (02/24 0853) SpO2:  [90 %-100 %] 97 % (02/24 0949) FiO2 (%):  [40 %] 40 % (02/23 1412) Weight:  [71.351 kg (157 lb 4.8 oz)-72.1 kg (158 lb 15.2 oz)] 71.351 kg (157 lb 4.8 oz) (02/24 0500)  Weight change: 0.477 kg (1 lb 0.8 oz) Filed Weights   06/29/13 1432 06/29/13 1705 06/30/13 0500  Weight: 72.1 kg (158 lb 15.2 oz) 72.1 kg (158 lb 15.2 oz) 71.351 kg (157 lb 4.8 oz)    Intake/Output: I/O last 3 completed shifts: In: 439.6 [P.O.:150; I.V.:289.6] Out: 2245 [Urine:2225; Blood:20]   Intake/Output this shift:  Total I/O In: 100 [P.O.:100] Out: -    CVS- RRR  RS- CTA diminished air entry  ABD- BS present soft non-distended  EXT- 1 + edema     Basic Metabolic Panel:  Recent Labs Lab 06/26/13 0500 06/27/13 0438 06/28/13 0600 06/29/13 0500 06/30/13 0410  NA 137 135* 134* 138 136*  K 3.9 3.6* 3.3* 3.6* 4.3  CL 90* 86* 85* 89* 90*  CO2 30 31 31  32 29  GLUCOSE 161* 185* 163* 181* 163*  BUN 88* 93* 97* 99* 59*  CREATININE 3.90* 3.89* 4.07* 3.92* 2.98*  CALCIUM 9.0 8.8 8.9 9.1 9.2  PHOS 8.5* 8.1* 8.5* 8.1* 7.0*    Liver Function Tests:  Recent Labs Lab 06/26/13 0500 06/27/13 0438 06/28/13 0600 06/29/13 0500 06/30/13 0410  ALBUMIN 2.9* 2.8* 2.7* 2.7* 2.7*   No results found for this basename: LIPASE, AMYLASE,  in the last 168 hours No results found for this basename: AMMONIA,  in the last 168 hours  CBC:  Recent Labs Lab 06/24/13 1455 06/26/13 1200 06/27/13 0438 06/29/13 0500  WBC 6.2 8.1 7.7 7.7  HGB 10.8* 10.9* 10.4* 10.0*  HCT 33.1* 32.6* 32.1* 31.1*  MCV 83.6 82.3 82.9 83.4  PLT 117* 109* 102* 129*     Cardiac Enzymes: No results found for this basename: CKTOTAL, CKMB, CKMBINDEX, TROPONINI,  in the last 168 hours  BNP: No components found with this basename: POCBNP,   CBG:  Recent Labs Lab 06/29/13 1232 06/29/13 1737 06/29/13 2044 06/30/13 0635 06/30/13 1126  GLUCAP 157* 119* 121* 226* 175*    Microbiology: Results for orders placed during the hospital encounter of 06/16/13  CULTURE, BLOOD (ROUTINE X 2)     Status: None   Collection Time    06/16/13  4:43 PM      Result Value Ref Range Status   Specimen Description BLOOD ARM LEFT   Final   Special Requests BOTTLES DRAWN AEROBIC AND ANAEROBIC 5CCBLUE 4CCRED   Final   Culture  Setup Time     Final   Value: 06/16/2013 22:10     Performed at [facility]   Culture     Final   Value: NO GROWTH 5 DAYS     Performed at [facility]   Report Status 06/22/2013 FINAL   Final  CULTURE, BLOOD (ROUTINE X 2)     Status: None   Collection Time    06/16/13  4:53 PM      Result Value Ref Range Status   Specimen Description BLOOD  ARM RIGHT   Final   Special Requests BOTTLES DRAWN AEROBIC AND ANAEROBIC 10CC   Final   Culture  Setup Time     Final   Value: 06/16/2013 22:10     [facility]   Culture     Final   Value: NO GROWTH 5 DAYS     [facility]   Report Status 06/22/2013 FINAL   Final  URINE CULTURE     Status: None   Collection Time    06/18/13  6:34 PM      Result Value Ref Range Status   Specimen Description URINE, RANDOM   Final   Special Requests NONE   Final   Culture  Setup Time     Final   Value: 06/18/2013 19:27     [facility]formed [facility]   Colony Count     Final   Value: >=100,000 COLONIES/ML     [facility]formed at [facility]   Culture     Final   Value: ESCHERICHIA COLI     [facility]formed at [facility]   Report Status 06/20/2013 FINAL   Final   Organism ID, Bacteria ESCHERICHIA COLI   Final  MRSA PCR SCREENING     Status:  None   Collection Time    06/28/13 10:18 PM      Result Value Ref Range Status   MRSA by PCR NEGATIVE  NEGATIVE Final   Comment:            The GeneXpert MRSA Assay (FDA     approved for NASAL specimens     only), is one component of a     comprehensive MRSA colonization     surveillance program. It is not     intended to diagnose MRSA     infection nor to guide or     monitor treatment for     MRSA infections.  URINE CULTURE     Status: None   Collection Time    06/29/13  2:09 AM      Result Value Ref Range Status   Specimen Description URINE, CATHETERIZED   Final   Special Requests NONE   Final   Culture  Setup Time     Final   Value: 06/29/2013 04:53     Performed at [facility]   Colony Count     Final   Value: NO GROWTH     [facility]formed at [facility]   Culture     Final   Value: NO GROWTH     [facility]formed at [facility]   Report Status 06/30/2013 FINAL   Final    Coagulation Studies: No results found for this basename: LABPROT, INR,  in the last 72 hours  Urinalysis:  Recent Labs  06/29/13 0209  COLORURINE YELLOW  LABSPEC 1.013  PHURINE 5.0  GLUCOSEU NEGATIVE  HGBUR NEGATIVE  BILIRUBINUR NEGATIVE  KETONESUR NEGATIVE  PROTEINUR NEGATIVE  UROBILINOGEN 0.2  NITRITE NEGATIVE  LEUKOCYTESUR NEGATIVE      Imaging: Dg Chest 2 View  06/28/2013   CLINICAL DATA:  CHF, dyspnea, history bronchitis, diabetes, hypertension, coronary artery disease post CABG  EXAM: CHEST  2 VIEW  COMPARISON:  06/24/2013  FINDINGS: Enlargement of cardiac silhouette post CABG.  Right jugular central venous catheter with tip projecting over SVC.  Atherosclerotic calcification aorta.  Mediastinal contour stable.  Improved pulmonary edema and left basilar opacity since previous exam.  No gross  pleural effusion or pneumothorax.  Bones demineralized.  IMPRESSION: Enlargement of cardiac silhouette post CABG.  Improved pulmonary edema and left basilar opacity since previous  study.   Electronically Signed   By: Ulyses Southward M.D.   On: 06/28/2013 18:52   Dg Chest Port 1 View  06/29/2013   CLINICAL DATA:  Postoperative film, status post line placement.  EXAM: PORTABLE CHEST - 1 VIEW  COMPARISON:  DG CHEST 2 VIEW dated 06/28/2013  FINDINGS: Since the previous study there has been interval placement of a large caliber dual lumen catheter via the left internal jugular approach. The tip of this catheter lies in the region of the right atrium. The right internal jugular venous catheter tip lies in the midportion of the SVC. The cardiopericardial silhouette remains enlarged. Its borders are less distinct today. The pulmonary interstitial markings remain mildly increased. The left hemidiaphragm is partially obscured. No definite pleural effusions are demonstrated. The patient has undergone previous CABG.  IMPRESSION: 1. There has been interval placement of a dual-lumen, large caliber catheter via the left internal jugular approach with the tip of the distal port lying in the region of the right atrium. 2. The pulmonary interstitial markings are somewhat more conspicuous today which may reflect interstitial edema or pneumonia. 3. The cardiopericardial silhouette remains enlarged and the pulmonary vascularity remains engorged centrally consistent with mild CHF.   Electronically Signed   By: David  [place]   On: 06/29/2013 11:33     Medications:   . milrinone 0.125 mcg/kg/min (06/30/13 0901)   . atorvastatin  80 mg Oral q1800  . budesonide-formoterol  2 puff Inhalation BID  . calcitRIOL  0.25 mcg Oral Daily  . calcium acetate  1,334 mg Oral TID WC  . clopidogrel  75 mg Oral Q breakfast  . DULoxetine  60 mg Oral Daily  . heparin  5,000 Units Subcutaneous 3 times per day  . insulin aspart  0-9 Units Subcutaneous TID WC  . ketoconazole   Topical BID  . pantoprazole  40 mg Oral Daily  . polyethylene glycol  17 g Oral Daily  . pregabalin  50 mg Oral BID  . senna  2 tablet Oral Daily   . sildenafil  20 mg Oral TID  . sodium chloride  3 mL Intravenous Q12H  . torsemide  40 mg Oral BID   sodium chloride, albuterol, ALPRAZolam, HYDROcodone-acetaminophen, sodium chloride  Assessment/ Plan:  This is a 68 y.o. year old female with significant prior vascular history including peripheral vascular disease status post femoropopliteal bypass with claudication, coronary artery disease status post multiple MIs status post CABG, CVA, type 2 diabetes, chronic renal insufficiency with a baseline creatinine of 2.7 presenting with cellulitis and acute kidney injury. Creatinine has now progressed to 3.7.[name] is a 68 yo female with a history of CAD s/p CABG (2007), ICM, chronic systolic HF EF ~25%, HTN, DM2, PVD s/p R fem-pop (12/2012) complicated by necrotic anterior abdominal wall fascia and lateral soft tissues and CKD stage III-IV. Last myoview was 10/2011 which was negative for ischemia. EF 31% with old antero-apical MI   1. 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 Bones PTH 458.5   4. Dm Hb a1c 7.9   5. Access Placed AVF 2/23   Tolerated dialysis poorly with hypotension and inability to remove fluid. Discussed with Dr Tat and Dr Jearld Pies who concur that  palliative care services should be initiated with the patients family consent.   LOS: 14 [name] W @TODAY @1 :59 PM

## 2013-06-30 NOTE — Progress Notes (Signed)
Entered patient's room and found her without oxygen on.  O2 sats were 79%.  Quickly rebounded up to 86% once oxygen placed back on but could not get sats in the 90%s without bumping up to 6L.  Patient now 92% on 6L.  Emphasized to patient not to take off her oxygen.  Notified charge nurse in case safety sitter may be needed.  Will continue to monitor.  Arva Chafe

## 2013-06-30 NOTE — Progress Notes (Signed)
[facility] PROGRESS NOTE  JANASHIA PARCO ZOX:[id number] DOB: 1945/08/03 DOA: 06/16/2013 PCP: [name], MD  Interim history 68 y.o. year old female with significant prior vascular history including peripheral vascular disease status post femoropopliteal bypass with claudication, coronary artery disease status post multiple MIs status post CABG, CVA, type 2 diabetes, chronic renal insufficiency with a baseline creatinine of 2.7 presenting with cellulitis and acute kidney injury. The patient suffered a pulmonary edema secondary to acute on chronic combined CHF. Her EF was noted to be 15-20% on echocardiogram. Cardiology was consulted. The heart failure team continued to follow the patient. She was started on sildenafil and milrinone. Right heart catheterization revealed elevated right heart pressures. Workup for pulmonary hypertension was undertaken. The patient's renal function continued to worsen. Dialysis catheter was placed and the patient was initiated on dialysis. She tolerated it poorly due to her hypertension and cardiomyopathy. Palate and medicine consultation was requested. The patient was made DO NOT RESUSCITATE. Poor prognosis was agreed upon by cardiology as well as nephrology.  However, after discussion with the patient's daughter, she wished to proceed with another dialysis session if possible.  Assessment/Plan: Acute on chronic renal failure (CKD4)  -Likely due to acute infection and a degree of cardiorenal syndrome  - Creatinine last year was ~2 in October 2014  -Appreciate nephrology followup  -06/29/13--L-IJ PermCath and -L-wrist AVF   -first HD on 06/29/13--cannot tolerate due to low BP -case discussed with Dr. Margrett Rud prognosis--family wishes to try one more session Acute on Chronic systolic and diastolic congestive heart failure  -EF 15-20%  -Switched to oral torsemide 40mg  bid on 06/26/13  -06/29/13--pt had worsen hypoxemia with pulm edema-->HD  -Appreciate Dr.  Gala Romney followup  -right heart catheterization--markedly elevated right heart pressures with moderate pulmonary hypertension--PAP=70/37  -V/Q scan--very low probability  -SPEP--no M-Spike  -remains on milrinone  -case discussed with Dr. Gala Romney 06/30/13-->poor prognosis PAH  -Likely a combination of COPD and left heart failure  -Workup in progress per cardiology  -Await UPEP  -PFTs---FEV1=52% suggests COPD  -V/Q scan--very low probability  -need outpt polysomnography  -sildenafil started 06/24/13  Goals of Care -palliative medicine consulted -pt is now DNR -long discussion with family again today--updated on poor prognosis -daughter [name] wishes to try HD one more time if possible Intermitten confusion/Delirium  -multifactorial including but not limited to the patient's acute on chronic renal failure, decompensated CHF, metabolic arrangements  -06/29/2013 UA--no pyuria  -Patient has required prolonged period of time to recover from any type of hypnotic medication/consolidation  Acute respiratory failure  -Patient had increasing oxygen demand after surgery on 06/29/2013--> started dialysis to remove fluid  -Hypoxemia partly due to hypoventilation from sedation from surgery as well as pulmonary edema  -Hypoxemia improved after dialysis and with the patient becoming more alert  -repeat CXR--pulmonary vascular congestion--patient remains clinical stable on 6 L. Thrombocytopenia  -Concerned about HIT versus medication induced  -Send HIT panel--pending  -labs do not suggest DIC  PVD (eripheral vascular disease) with claudication:  -ABI shows right WNL and left with moderate disease- can follow up outpatient with vascular  Cellulitis:  -Discontinue antibiotics. Has finished 10 day course doxy 06/27/13  UTI  - E coli resistant to FQ- outpatient study. Sensitive to rocephin.  -Finished 7 days of cefuroxime 06/25/2013  Tobacco abuse  -encourage cessation  Hyperlipidemia  -Continue  Lipitor  DM2  -HgbA1C- 7.9  -Patient had episodes of hypoglycemia due to poor po intake  -d/c lantus and observe  -Continue  NovoLog sliding scale  -CBGs with wide variations--suspected due to the patient's variations in oral intake  -Due to the patient's recent hypoglycemic episodes, will continue conservative treatment  Family Communication: updated daughter, [name]-- today  Disposition Plan: Home vs SNF        Procedures/Studies: Dg Chest 2 View  06/28/2013   CLINICAL DATA:  CHF, dyspnea, history bronchitis, diabetes, hypertension, coronary artery disease post CABG  EXAM: CHEST  2 VIEW  COMPARISON:  06/24/2013  FINDINGS: Enlargement of cardiac silhouette post CABG.  Right jugular central venous catheter with tip projecting over SVC.  Atherosclerotic calcification aorta.  Mediastinal contour stable.  Improved pulmonary edema and left basilar opacity since previous exam.  No gross pleural effusion or pneumothorax.  Bones demineralized.  IMPRESSION: Enlargement of cardiac silhouette post CABG.  Improved pulmonary edema and left basilar opacity since previous study.   Electronically Signed   By: Ulyses Southward M.D.   On: 06/28/2013 18:52   Dg Chest 2 View  06/18/2013   CLINICAL DATA:  Shortness of breath, history of CHF  EXAM: CHEST  2 VIEW  COMPARISON:  DG CHEST 1V PORT dated 02/11/2013  FINDINGS: Low lung volumes. Cardiac silhouette is enlarged. Patient is status post median sternotomy coronary artery bypass grafting. There is diffuse prominence of the interstitial markings and mild peribronchial cuffing. Areas of increased density projects within the lung bases. No focal regions of consolidation appreciated. There is blunting of the costophrenic angles. The osseous structures unremarkable.  IMPRESSION: Interstitial findings likely representing pulmonary edema. A component of chronic bronchitic changes also a diagnostic consideration. Bibasilar infiltrate versus atelectasis. Surveillance evaluation  recommended.   Electronically Signed   By: [name] M.D.   On: 06/18/2013 18:16   Dg Tibia/fibula Left  06/16/2013   CLINICAL DATA:  68 year old female with lower extremity soft tissue injury, abrasions/burning, infected. Initial encounter.  EXAM: LEFT TIBIA AND FIBULA - 2 VIEW  COMPARISON:  Left lower extremity venous ultrasound from the same day reported separately.  FINDINGS: Small medial surgical clips near the level of the knee joint. Evidence of widespread subcutaneous stranding and increased density at the level of the tib-fib. Questionable increased striated pattern of muscles in the calf. No subcutaneous gas. Calcified atherosclerosis. No radiopaque foreign body identified.  No acute fracture or dislocation identified in the tibia or fibula. Bulky chronic degenerative spurring/heterotopic ossification at the tibial tuberosity. No cortical osteolysis identified.  IMPRESSION: 1. Increased soft tissue density compatible with cellulitis in this clinical setting. No subcutaneous gas or retained foreign body identified. 2. No acute osseous abnormality identified. Advanced chronic posttraumatic / degenerative changes at the tibial tuberosity.   Electronically Signed   By: Augusto Gamble M.D.   On: 06/16/2013 15:12   US Renal  06/18/2013   CLINICAL DATA:  An elevated creatinine. History of chronic renal disease  EXAM: RENAL/URINARY TRACT ULTRASOUND COMPLETE  COMPARISON:  DG ABD PORTABLE 1V dated 02/12/2013; US RENAL dated 02/09/2013  FINDINGS: Right Kidney:  Length: 9.1 cm. There is decreased corticomedullary differentiation. A small benign-appearing 0.7 x 0.4 x 0.7 cm cyst is identified within the midpole periphery the right kidney. There is no evidence of solid-appearing masses, hydronephrosis nor calculi.  Left Kidney:  Length: 11.9 cm. There is decreased corticomedullary differentiation. There is no evidence of hydronephrosis, solid or cystic masses, nor calculi.  Bladder:  Urinary bladder is decompressed.   IMPRESSION: Increased cortical echogenicity bilaterally. Cysts consistent with patient's history of chronic renal disease. There is no evidence  of obstructive uropathy no calculi. Small benign-appearing cyst is appreciated within the midpole region right kidney.   Electronically Signed   By: [name] M.D.   On: 06/18/2013 18:29   Nm Pulmonary Perf And Vent  06/25/2013   CLINICAL DATA:  Pulmonary hypertension  EXAM: NUCLEAR MEDICINE VENTILATION - PERFUSION LUNG SCAN  TECHNIQUE: Ventilation images were obtained in multiple projections using inhaled aerosol technetium 99 M DTPA. Perfusion images were obtained in multiple projections after intravenous injection of Tc-55m MAA.  COMPARISON:  Chest radiograph 06/24/2013  RADIOPHARMACEUTICALS:  40.0 mCi Tc-60m DTPA aerosol and 6.0 mCi Tc-67m MAA  FINDINGS: Ventilation: Mild peripheral irregularity of ventilation.  Enlargement of cardiac silhouette with diminished ventilation in lingula and left lower lobe.  Mild central airway deposition of tracer.  Perfusion: Enlargement of cardiac silhouette.  Mild diminished perfusion to left lower lobe and lingula, less severe than ventilatory abnormality.  Single small subsegmental perfusion defect in left lower lobe.  No other perfusion defects identified.  Chest radiograph significant for enlargement of cardiac silhouette post CABG and bibasilar airspace disease.  IMPRESSION: Very low probability for pulmonary embolism.   Electronically Signed   By: Ulyses Southward M.D.   On: 06/25/2013 11:45   US Venous Img Lower Unilateral Left  06/16/2013   CLINICAL DATA:  Soft tissue edema  EXAM: LEFT LOWER EXTREMITY VENOUS DUPLEX ULTRASOUND  TECHNIQUE: Gray-scale sonography with graded compression, as well as color Doppler and duplex ultrasound, were performed to evaluate the deep venous system from the level of the common femoral vein through the popliteal and proximal calf veins. Spectral Doppler was utilized to evaluate flow at rest  and with distal augmentation maneuvers.  COMPARISON:  None.  FINDINGS: Flow in the venous structures of the left lower extremity is spontaneous and phasic in all segments. There is normal compression and augmentation in the venous structures of the left lower extremity. Venous Doppler signal is normal in all regions. There is no thrombus in the deep or visualized superficial venous structures on the left. There is no deep venous incompetence. There is soft tissue edema which is causing some diminished visualization of superficial venous structures in the thigh and calf regions.  IMPRESSION: No evidence of left lower extremity deep venous thrombosis. Moderate soft tissue edema.   Electronically Signed   By: Bretta Bang M.D.   On: 06/16/2013 14:30   Dg Chest Port 1 View  06/29/2013   CLINICAL DATA:  Postoperative film, status post line placement.  EXAM: PORTABLE CHEST - 1 VIEW  COMPARISON:  DG CHEST 2 VIEW dated 06/28/2013  FINDINGS: Since the previous study there has been interval placement of a large caliber dual lumen catheter via the left internal jugular approach. The tip of this catheter lies in the region of the right atrium. The right internal jugular venous catheter tip lies in the midportion of the SVC. The cardiopericardial silhouette remains enlarged. Its borders are less distinct today. The pulmonary interstitial markings remain mildly increased. The left hemidiaphragm is partially obscured. No definite pleural effusions are demonstrated. The patient has undergone previous CABG.  IMPRESSION: 1. There has been interval placement of a dual-lumen, large caliber catheter via the left internal jugular approach with the tip of the distal port lying in the region of the right atrium. 2. The pulmonary interstitial markings are somewhat more conspicuous today which may reflect interstitial edema or pneumonia. 3. The cardiopericardial silhouette remains enlarged and the pulmonary vascularity remains  engorged centrally consistent with mild  CHF.   Electronically Signed   By: Kimiah Hibner  [place]   On: 06/29/2013 11:33   Dg Chest Port 1 View  06/24/2013   CLINICAL DATA:  Central line placement  EXAM: PORTABLE CHEST - 1 VIEW  COMPARISON:  DG CHEST 2 VIEW dated 06/18/2013  FINDINGS: There is interval placement of a right central venous line with tip in distal SVC. No pneumothorax. There is bilateral mild airspace disease in the lung bases. There is small effusions.  IMPRESSION: 1. Right central venous line in good position without complication. 2. Increase in bibasilar airspace disease suggests pulmonary edema.   Electronically Signed   By: Genevive BiStewart  Edmunds M.D.   On: 06/24/2013 18:59         Subjective: Patient is resting comfortably. Denies any headache, chest discomfort, shortness breath, nausea, vomiting, diarrhea, vomiting, dysuria. No fevers or chills.   Objective: Filed Vitals:   06/30/13 0500 06/30/13 0853 06/30/13 0949 06/30/13 1335  BP: 116/69 121/67  105/63  Pulse: 106 123  108  Temp: 97.9 F (36.6 C)   98.4 F (36.9 C)  TempSrc: Oral   Oral  Resp: 20   18  Height:      Weight: 71.351 kg (157 lb 4.8 oz)     SpO2: 92% 96% 97% 99%    Intake/Output Summary (Last 24 hours) at 06/30/13 1427 Last data filed at 06/30/13 1300  Gross per 24 hour  Intake  409.6 ml  Output    601 ml  Net -191.4 ml   Weight change: 0.477 kg (1 lb 0.8 oz) Exam:   General:  Pt is alert, follows commands appropriately, not in acute distress  HEENT: No icterus, No thrush,  [place]/AT  Cardiovascular: RRR, S1/S2, no rubs, no gallops  Respiratory: Bilateral crackles. No wheezing good air movement her  Abdomen: Soft/+BS, non tender, non distended, no guarding  Extremities: trace LE edema, No lymphangitis, No petechiae, No rashes, no synovitis  Data Reviewed: Basic Metabolic Panel:  Recent Labs Lab 06/26/13 0500 06/27/13 0438 06/28/13 0600 06/29/13 0500 06/30/13 0410  NA 137 135* 134* 138 136*   K 3.9 3.6* 3.3* 3.6* 4.3  CL 90* 86* 85* 89* 90*  CO2 30 31 31  32 29  GLUCOSE 161* 185* 163* 181* 163*  BUN 88* 93* 97* 99* 59*  CREATININE 3.90* 3.89* 4.07* 3.92* 2.98*  CALCIUM 9.0 8.8 8.9 9.1 9.2  PHOS 8.5* 8.1* 8.5* 8.1* 7.0*   Liver Function Tests:  Recent Labs Lab 06/26/13 0500 06/27/13 0438 06/28/13 0600 06/29/13 0500 06/30/13 0410  ALBUMIN 2.9* 2.8* 2.7* 2.7* 2.7*   No results found for this basename: LIPASE, AMYLASE,  in the last 168 hours No results found for this basename: AMMONIA,  in the last 168 hours CBC:  Recent Labs Lab 06/24/13 1455 06/26/13 1200 06/27/13 0438 06/29/13 0500  WBC 6.2 8.1 7.7 7.7  HGB 10.8* 10.9* 10.4* 10.0*  HCT 33.1* 32.6* 32.1* 31.1*  MCV 83.6 82.3 82.9 83.4  PLT 117* 109* 102* 129*   Cardiac Enzymes: No results found for this basename: CKTOTAL, CKMB, CKMBINDEX, TROPONINI,  in the last 168 hours BNP: No components found with this basename: POCBNP,  CBG:  Recent Labs Lab 06/29/13 1232 06/29/13 1737 06/29/13 2044 06/30/13 0635 06/30/13 1126  GLUCAP 157* 119* 121* 226* 175*    Recent Results (from the past 240 hour(s))  MRSA PCR SCREENING     Status: None   Collection Time    06/28/13 10:18 PM  Result Value Ref Range Status   MRSA by PCR NEGATIVE  NEGATIVE Final   Comment:            The GeneXpert MRSA Assay (FDA     approved for NASAL specimens     only), is one component of a     comprehensive MRSA colonization     surveillance program. It is not     intended to diagnose MRSA     infection nor to guide or     monitor treatment for     MRSA infections.  URINE CULTURE     Status: None   Collection Time    06/29/13  2:09 AM      Result Value Ref Range Status   Specimen Description URINE, CATHETERIZED   Final   Special Requests NONE   Final   Culture  Setup Time     Final   Value: 06/29/2013 04:53     Performed at [facility]   Colony Count     Final   Value: NO GROWTH     Performed at [facility]   Culture     Final   Value: NO GROWTH     Performed at [facility]   Report Status 06/30/2013 FINAL   Final     Scheduled Meds: . atorvastatin  80 mg Oral q1800  . budesonide-formoterol  2 puff Inhalation BID  . calcitRIOL  0.25 mcg Oral Daily  . calcium acetate  1,334 mg Oral TID WC  . clopidogrel  75 mg Oral Q breakfast  . DULoxetine  60 mg Oral Daily  . heparin  5,000 Units Subcutaneous 3 times per day  . insulin aspart  0-9 Units Subcutaneous TID WC  . ketoconazole   Topical BID  . pantoprazole  40 mg Oral Daily  . polyethylene glycol  17 g Oral Daily  . pregabalin  50 mg Oral BID  . senna  2 tablet Oral Daily  . sildenafil  20 mg Oral TID  . sodium chloride  3 mL Intravenous Q12H  . torsemide  40 mg Oral BID   Continuous Infusions: . milrinone 0.125 mcg/kg/min (06/30/13 0901)     Abbigayle Toole, DO  [facility] Pager [phone]  If 7PM-7AM, please contact night-coverage [email or web address] Password [email or web address] 06/30/2013, 2:27 PM   LOS: 14 days

## 2013-06-30 NOTE — Progress Notes (Signed)
PT Cancellation Note  Patient Details Name: [name] MRN: [id number] DOB: June 29, 1945   Cancelled Treatment:    Reason Eval/Treat Not Completed: Medical issues which prohibited therapy. Per RN pt with more confusion, requiring 6LO2 today. Will attempt as appropriate.   Caeson Filippi 06/30/2013, 1:31 PM

## 2013-06-30 NOTE — Progress Notes (Signed)
Pt confused, requiring 6L O2. N/a for ambulation today.  Ethelda Chick CES, ACSM 11:28 AM 06/30/2013

## 2013-06-30 NOTE — Consult Note (Signed)
Patient [name]      DOB: 1945/06/14      BJY:[id number]     Consult Note from the Palliative Medicine Team at [facility]    Consult Requested by: Tonye Becket, NP      PCP: [name], MD Reason for Consultation: GOC and options.     Phone Number:[phone]  Assessment of patients Current state: Ms. Beahm is a 68 yo female with CHF (EF 15-20%) and now utilizing Milrinone infusion, cellulitis, and acute on chronic renal disease. PMH CAD, MI, s/p CABG, CVA, diabetes 2, and baseline creatinine 2.7. Ms. Cutsforth has completed antibiotic courses for cellulitis and UTI this hospitalization. She was initiated on hemodialysis yesterday and did not tolerate well with hypotension and unable to remove any fluid. I had a long, frank discussion with her daughter, [name], over the telephone. I explained her mother's poor prognosis and the difficulty she will have tolerating dialysis due to her heart failure. [name] is tearful as she seems to understand the gravity of her mother's situation. We discussed code status and [name] says she now understands the limited benefit resuscitation would be for Ms. Awwad and has chosen to make her a DNR. We also discussed her limited prognosis (possibly weeks or less) if she is unable to tolerate dialysis. [name] is understanding but wishes to attempt dialysis (at least once more) to know that she has exhausted all efforts. We did discuss the benefit of a focus on comfort care in a hospice facility if Ms. Moors does not tolerate dialysis. [name] continues to be very tearful and is processing all the information I have given her today. Ms. Buechler was living at home with a niece prior to this hospitalization and [name] says was fairly independent so this comes as quite a shock to [place]. I reassured her that her mother appeared to be resting comfortably when I was at bedside and did not appear to be in any pain or discomfort at this time. The goals are to continue  to treat for now and I will continue to follow Ms. Seltzer and discuss with [name] a likely path towards full comfort.    Goals of Care: 1.  Code Status: DNR   2. Scope of Treatment: Continue all treatment for now. Family seems realistic but are overwhelmed right now. I will continue to work with her daughter and help her move towards comfort if Ms. Cieslinski continues to not tolerate dialysis.    4. Disposition: To be determined on outcomes.    3. Symptom Management:   1. Anxiety/Agitation: Xanax prn.  2. Pain: Vicodin prn.  3. Bowel Regimen: Dulcolax supp prn.  4. Weakness: Continue medical management, including Milrinone infusion. PT following.  4. Psychosocial: Emotional support provided to daughter, [name], as this was a very difficult discussion for her.   Brief HPI: 68 yo female with CHF (15-20%) on Milrinone infusion and not tolerating hemodialysis.   ROS: Unable to elicit - lethargic.     PMH:  Past Medical History  Diagnosis Date  . Coronary artery disease     a. s/p CABG 2007 (L-RI, S-dRCA, S-LAD, S-OM);  b. myoview 6/13:  anteroapical MI, no ischemia, EF 31%  . Diabetes mellitus   . Hypertension   . Asthma   . Bronchitis   . Reflux   . Hyperlipidemia   . Chronic systolic heart failure     a. echo 6/13: mild LVH, EF 15-20%, diff HK, Gr 1 DD, mild reduced RVSF, PASP  32.  . GERD (gastroesophageal reflux disease)   . Arthritis   . CKD (chronic kidney disease)   . Chronic back pain   . Hypoxia     a. Adm 09/2012 - discharged with home O2 in setting of CHF.  Marland Kitchen. Abnormal stress test     a. Nuc 10/2011: bright target which may be in left breast or L chest wall or axilla. Pt reports she f/u with PCP and mammogram was OK (left breast cyst).  . Complication of anesthesia     slow to awaken  . 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 Anginal pain     "rarely, had some 2 months ago, not sure if heart or arm, cardiologist aware.  . CHF (congestive heart failure)    . Shortness of breath     at times- sensitive to perfume.  . Peripheral vascular disease   . Depression   . Stroke 05/2011    a. R ischemic CVA 05/2011. Right hand weakness.  . Pneumonia     2010ish  . Neuropathic pain     feet and hands  . Polio     as a child  . Altered level of consciousness     2 weeks now  . Cellulitis 06/2013    LEFT LOWER EXTREMITY     PSH: Past Surgical History  Procedure Laterality Date  . Coronary artery bypass graft      LIMA to RI, SVG to distal RCA, SVG to LAD, SVG to OM. 2007  . Joint replacement      Right hip replacement   . Debridement skin / sq / muscle of trunk      s/p staph infection from CABG 2007  . Cystectomy      Subcutaneous  . Partial hip arthroplasty    . Stroke    . Femoral-popliteal bypass graft Right 01/20/2013    Procedure: BYPASS GRAFT RIGHT FEMORAL-BELOW KNEE POPLITEAL ARTERY WITH RINGED GORTEX GRAFT ;  Surgeon: Sherren Kernsharles E Fields, MD;  [facility];  Service: Vascular;  Laterality: Right;  . Patch angioplasty Right 01/20/2013    Procedure: VEIN  PATCH ANGIOPLASTY;  Surgeon: Sherren Kernsharles E Fields, MD;  [facility];  Service: Vascular;  Laterality: Right;  . I&d extremity Right 02/06/2013    Procedure: DEBRIDEMENT RIGHT GROIN WOUND & APPLICATION OF WOUND VAC.;  Surgeon: Sherren Kernsharles E Fields, MD;  [facility];  Service: Vascular;  Laterality: Right;  . Groin debridement Right 02/11/2013    Procedure: GROIN DEBRIDEMENT & WOUND VAC APPLICATION;  Surgeon: Sherren Kernsharles E Fields, MD;  [facility];  Service: Vascular;  Laterality: Right;  . I&d extremity Right 02/23/2013    Procedure: IRRIGATION AND DEBRIDEMENT RIGHT GROIN;  Surgeon: Sherren Kernsharles E Fields, MD;  [facility];  Service: Vascular;  Laterality: Right;  . Application of wound vac Right 02/23/2013    Procedure: APPLICATION OF WOUND VAC;  Surgeon: Sherren Kernsharles E Fields, MD;  [facility];  Service: Vascular;  Laterality: Right;  . Hematoma evacuation Right 02/12/2013    Procedure:  EVACUATION HEMATOMA;  Surgeon: Fransisco HertzBrian L Chen, MD;  [facility];  Service: Vascular;  Laterality: Right;   I have reviewed the FH and SH and  If appropriate update it with new information. Allergies  Allergen Reactions  . Sulfa Antibiotics Anaphylaxis and Shortness Of Breath  . Codeine Nausea Only and Other (See Comments)    Severe headache  . Ivp Dye [Iodinated Diagnostic Agents] Hives and  Other (See Comments)    Severe anxiety  . Penicillins Swelling and Other (See Comments)    "huge sores". Pt has tolerated both Zosyn and Keflex in the past.   Scheduled Meds: . atorvastatin  80 mg Oral q1800  . budesonide-formoterol  2 puff Inhalation BID  . calcitRIOL  0.25 mcg Oral Daily  . calcium acetate  1,334 mg Oral TID WC  . clopidogrel  75 mg Oral Q breakfast  . DULoxetine  60 mg Oral Daily  . heparin  5,000 Units Subcutaneous 3 times per day  . insulin aspart  0-9 Units Subcutaneous TID WC  . ketoconazole   Topical BID  . pantoprazole  40 mg Oral Daily  . polyethylene glycol  17 g Oral Daily  . pregabalin  50 mg Oral BID  . senna  2 tablet Oral Daily  . sildenafil  20 mg Oral TID  . sodium chloride  3 mL Intravenous Q12H  . torsemide  40 mg Oral BID   Continuous Infusions: . milrinone 0.125 mcg/kg/min (06/30/13 0901)   PRN Meds:.sodium chloride, albuterol, ALPRAZolam, HYDROcodone-acetaminophen, sodium chloride    BP 121/67  Pulse 123  Temp(Src) 97.9 F (36.6 C) (Oral)  Resp 20  Ht 5' (1.524 m)  Wt 71.351 kg (157 lb 4.8 oz)  BMI 30.72 kg/m2  SpO2 97%   PPS: 30%   Intake/Output Summary (Last 24 hours) at 06/30/13 1141 Last data filed at 06/30/13 0800  Gross per 24 hour  Intake  289.6 ml  Output    750 ml  Net -460.4 ml   LBM: 06/30/13                         Physical Exam:  General: NAD, Lethargic HEENT: [place]/AT, no icterus, dry mucous membranes Chest: Bibasilar crackles, no labored breathes CVS: RRR, S1 S2 Abdomen: Soft, NT, ND, +BS Ext: Trace ankle edema,  warm to touch Neuro: Lethargic, follows commands  Labs: CBC    Component Value Date/Time   WBC 7.7 06/29/2013 0500   WBC 9.2 12/19/2012 1601   RBC 3.73* 06/29/2013 0500   RBC 4.66 12/19/2012 1601   HGB 10.0* 06/29/2013 0500   HGB 13.7 12/19/2012 1601   HCT 31.1* 06/29/2013 0500   HCT 42.9 12/19/2012 1601   PLT 129* 06/29/2013 0500   MCV 83.4 06/29/2013 0500   MCV 92.0 12/19/2012 1601   MCH 26.8 06/29/2013 0500   MCH 29.4 12/19/2012 1601   MCHC 32.2 06/29/2013 0500   MCHC 31.9 12/19/2012 1601   RDW 15.3 06/29/2013 0500   LYMPHSABS 1.6 06/16/2013 1915   MONOABS 0.4 06/16/2013 1915   EOSABS 0.2 06/16/2013 1915   BASOSABS 0.0 06/16/2013 1915    BMET    Component Value Date/Time   NA 136* 06/30/2013 0410   K 4.3 06/30/2013 0410   CL 90* 06/30/2013 0410   CO2 29 06/30/2013 0410   GLUCOSE 163* 06/30/2013 0410   BUN 59* 06/30/2013 0410   CREATININE 2.98* 06/30/2013 0410   CALCIUM 9.2 06/30/2013 0410   GFRNONAA 15* 06/30/2013 0410   GFRAA 18* 06/30/2013 0410    CMP     Component Value Date/Time   NA 136* 06/30/2013 0410   K 4.3 06/30/2013 0410   CL 90* 06/30/2013 0410   CO2 29 06/30/2013 0410   GLUCOSE 163* 06/30/2013 0410   BUN 59* 06/30/2013 0410   CREATININE 2.98* 06/30/2013 0410   CALCIUM 9.2 06/30/2013 0410   PROT 7.1  06/19/2013 0612   ALBUMIN 2.7* 06/30/2013 0410   AST 14 06/19/2013 0612   ALT 15 06/19/2013 0612   ALKPHOS 87 06/19/2013 0612   BILITOT 0.3 06/19/2013 0612   GFRNONAA 15* 06/30/2013 0410   GFRAA 18* 06/30/2013 0410     Time In Time Out Total Time Spent with Patient Total Overall Time  1100 1220     Greater than 50%  of this time was spent counseling and coordinating care related to the above assessment and plan.  Yong Channel, NP Palliative Medicine Team Pager # [phone] Team Phone # [phone]

## 2013-07-01 LAB — RENAL FUNCTION PANEL
ALBUMIN: 2.6 g/dL — AB (ref 3.5–5.2)
BUN: 66 mg/dL — ABNORMAL HIGH (ref 6–23)
CALCIUM: 9 mg/dL (ref 8.4–10.5)
CO2: 29 mEq/L (ref 19–32)
Chloride: 89 mEq/L — ABNORMAL LOW (ref 96–112)
Creatinine, Ser: 3.8 mg/dL — ABNORMAL HIGH (ref 0.50–1.10)
GFR calc Af Amer: 13 mL/min — ABNORMAL LOW (ref >90)
GFR, EST NON AFRICAN AMERICAN: 11 mL/min — AB (ref >90)
Glucose, Bld: 161 mg/dL — ABNORMAL HIGH (ref 70–99)
Phosphorus: 8.6 mg/dL — ABNORMAL HIGH (ref 2.3–4.6)
Potassium: 4.3 mEq/L (ref 3.7–5.3)
SODIUM: 135 meq/L — AB (ref 137–147)

## 2013-07-01 LAB — GLUCOSE, CAPILLARY
GLUCOSE-CAPILLARY: 175 mg/dL — AB (ref 70–99)
Glucose-Capillary: 208 mg/dL — ABNORMAL HIGH (ref 70–99)
Glucose-Capillary: 146 mg/dL — ABNORMAL HIGH (ref 70–99)

## 2013-07-01 MED ORDER — TORSEMIDE 20 MG PO TABS
40.0000 mg | ORAL_TABLET | Freq: Every day | ORAL | Status: AC
Start: 2013-07-01 — End: ?

## 2013-07-01 MED ORDER — MORPHINE SULFATE (CONCENTRATE) 20 MG/ML PO SOLN: 5.0000 mg | ORAL | Status: AC | PRN

## 2013-07-01 MED ORDER — HYDROXYZINE HCL 25 MG PO TABS
25.0000 mg | ORAL_TABLET | Freq: Three times a day (TID) | ORAL | Status: DC | PRN
  Filled 2013-07-01: qty 1

## 2013-07-01 MED ORDER — HYDROXYZINE HCL 25 MG PO TABS
12.5000 mg | ORAL_TABLET | Freq: Three times a day (TID) | ORAL | Status: DC | PRN
  Filled 2013-07-01: qty 1

## 2013-07-01 MED ORDER — SENNA 8.6 MG PO TABS
2.0000 | ORAL_TABLET | Freq: Every day | ORAL | Status: AC
Start: 2013-07-01 — End: ?

## 2013-07-01 MED ORDER — ALPRAZOLAM 0.5 MG PO TABS: 0.5000 mg | ORAL_TABLET | ORAL | Status: AC | PRN

## 2013-07-01 MED ORDER — INSULIN GLARGINE 100 UNIT/ML ~~LOC~~ SOLN
20.0000 [IU] | Freq: Every day | SUBCUTANEOUS | Status: AC
Start: 2013-07-01 — End: ?

## 2013-07-01 MED ORDER — HYDROCODONE-ACETAMINOPHEN 5-325 MG PO TABS: ORAL_TABLET | ORAL | Status: AC

## 2013-07-01 NOTE — Progress Notes (Signed)
[name]   Subjective:   Interval History: comfortable no worsening dyspnea   Objective:  Vital signs in last 24 hours:  Temp:  [97.9 F (36.6 C)-98.7 F (37.1 C)] 97.9 F (36.6 C) (02/25 0558) Pulse Rate:  [51-108] 51 (02/25 0558) Resp:  [18-20] 20 (02/25 0558) BP: (95-116)/(56-64) 116/56 mmHg (02/25 0558) SpO2:  [99 %-100 %] 100 % (02/25 0558) Weight:  [72.394 kg (159 lb 9.6 oz)] 72.394 kg (159 lb 9.6 oz) (02/25 0558)  Weight change: 0.294 kg (10.4 oz) Filed Weights   06/29/13 1705 06/30/13 0500 07/01/13 0558  Weight: 72.1 kg (158 lb 15.2 oz) 71.351 kg (157 lb 4.8 oz) 72.394 kg (159 lb 9.6 oz)    Intake/Output: I/O last 3 completed shifts: In: 649.6 [P.O.:460; I.V.:189.6] Out: 801 [Urine:800; Stool:1]   Intake/Output this shift:  Total I/O In: 240 [P.O.:240] Out: -  CVS- RRR  RS- CTA diminished air entry  ABD- BS present soft non-distended  EXT- 1 + edema    Basic Metabolic Panel:  Recent Labs Lab 06/27/13 0438 06/28/13 0600 06/29/13 0500 06/30/13 0410 07/01/13 0350  NA 135* 134* 138 136* 135*  K 3.6* 3.3* 3.6* 4.3 4.3  CL 86* 85* 89* 90* 89*  CO2 31 31 32 29 29  GLUCOSE 185* 163* 181* 163* 161*  BUN 93* 97* 99* 59* 66*  CREATININE 3.89* 4.07* 3.92* 2.98* 3.80*  CALCIUM 8.8 8.9 9.1 9.2 9.0  PHOS 8.1* 8.5* 8.1* 7.0* 8.6*    Liver Function Tests:  Recent Labs Lab 06/27/13 0438 06/28/13 0600 06/29/13 0500 06/30/13 0410 07/01/13 0350  ALBUMIN 2.8* 2.7* 2.7* 2.7* 2.6*   No results found for this basename: LIPASE, AMYLASE,  in the last 168 hours No results found for this basename: AMMONIA,  in the last 168 hours  CBC:  Recent Labs Lab 06/24/13 1455 06/26/13 1200 06/27/13 0438 06/29/13 0500  WBC 6.2 8.1 7.7 7.7  HGB 10.8* 10.9* 10.4* 10.0*  HCT 33.1* 32.6* 32.1* 31.1*  MCV 83.6 82.3 82.9 83.4  PLT 117* 109* 102* 129*    Cardiac Enzymes: No results found for this basename: CKTOTAL, CKMB, CKMBINDEX,  TROPONINI,  in the last 168 hours  BNP: No components found with this basename: POCBNP,   CBG:  Recent Labs Lab 06/30/13 1126 06/30/13 1640 06/30/13 2035 07/01/13 0816 07/01/13 1127  GLUCAP 175* 201* 139* 208* 146*    Microbiology: Results for orders placed during the hospital encounter of 06/16/13  CULTURE, BLOOD (ROUTINE X 2)     Status: None   Collection Time    06/16/13  4:43 PM      Result Value Ref Range Status   Specimen Description BLOOD ARM LEFT   Final   Special Requests BOTTLES DRAWN AEROBIC AND ANAEROBIC 5CCBLUE 4CCRED   Final   Culture  Setup Time     Final   Value: 06/16/2013 22:10     Performed at [facility]   Culture     Final   Value: NO GROWTH 5 DAYS     Performed at [facility]   Report Status 06/22/2013 FINAL   Final  CULTURE, BLOOD (ROUTINE X 2)     Status: None   Collection Time    06/16/13  4:53 PM      Result Value Ref Range Status   Specimen Description BLOOD ARM RIGHT   Final   Special Requests BOTTLES DRAWN AEROBIC AND ANAEROBIC 10CC   Final   Culture  Setup Time     Final   Value: 06/16/2013 22:10     [facility]   Culture     Final   Value: NO GROWTH 5 DAYS     [facility] at [facility]   Report Status 06/22/2013 FINAL   Final  URINE CULTURE     Status: None   Collection Time    06/18/13  6:34 PM      Result Value Ref Range Status   Specimen Description URINE, RANDOM   Final   Special Requests NONE   Final   Culture  Setup Time     Final   Value: 06/18/2013 19:27     [facility]formed at [facility]   Colony Count     Final   Value: >=100,000 COLONIES/ML     [facility]formed at [facility]   Culture     Final   Value: ESCHERICHIA COLI     [facility]formed at [facility]   Report Status 06/20/2013 FINAL   Final   Organism ID, Bacteria ESCHERICHIA COLI   Final  MRSA PCR SCREENING     Status: None   Collection Time    06/28/13 10:18 PM      Result Value Ref Range Status    MRSA by PCR NEGATIVE  NEGATIVE Final   Comment:            The GeneXpert MRSA Assay (FDA     approved for NASAL specimens     only), is one component of a     comprehensive MRSA colonization     surveillance program. It is not     intended to diagnose MRSA     infection nor to guide or     monitor treatment for     MRSA infections.  URINE CULTURE     Status: None   Collection Time    06/29/13  2:09 AM      Result Value Ref Range Status   Specimen Description URINE, CATHETERIZED   Final   Special Requests NONE   Final   Culture  Setup Time     Final   Value: 06/29/2013 04:53     Performed at [facility]   Colony Count     Final   Value: NO GROWTH     [facility]formed at [facility]   Culture     Final   Value: NO GROWTH     Performed at [facility]   Report Status 06/30/2013 FINAL   Final    Coagulation Studies: No results found for this basename: LABPROT, INR,  in the last 72 hours  Urinalysis:  Recent Labs  06/29/13 0209  COLORURINE YELLOW  LABSPEC 1.013  PHURINE 5.0  GLUCOSEU NEGATIVE  HGBUR NEGATIVE  BILIRUBINUR NEGATIVE  KETONESUR NEGATIVE  PROTEINUR NEGATIVE  UROBILINOGEN 0.2  NITRITE NEGATIVE  LEUKOCYTESUR NEGATIVE      Imaging: No results found.   Medications:   . milrinone 0.125 mcg/kg/min (07/01/13 0037)   . atorvastatin  80 mg Oral q1800  . budesonide-formoterol  2 puff Inhalation BID  . calcitRIOL  0.25 mcg Oral Daily  . calcium acetate  1,334 mg Oral TID WC  . clopidogrel  75 mg Oral Q breakfast  . DULoxetine  60 mg Oral Daily  . heparin  5,000 Units Subcutaneous 3 times per day  . insulin aspart  0-9 Units Subcutaneous TID WC  . ketoconazole   Topical BID  .  pantoprazole  40 mg Oral Daily  . polyethylene glycol  17 g Oral Daily  . pregabalin  50 mg Oral BID  . senna  2 tablet Oral Daily  . sildenafil  20 mg Oral TID  . sodium chloride  3 mL Intravenous Q12H  . torsemide  40 mg Oral BID   sodium chloride,  albuterol, ALPRAZolam, bisacodyl, HYDROcodone-acetaminophen, sodium chloride  Assessment/ Plan:  This is a 68 y.o. year old female with significant prior vascular history including peripheral vascular disease status post femoropopliteal bypass with claudication, coronary artery disease status post multiple MIs status post CABG, CVA, type 2 diabetes, chronic renal insufficiency with a baseline creatinine of 2.7 presenting with cellulitis and acute kidney injury. Creatinine has now progressed to 3.7.[name] is a 68 yo female with a history of CAD s/p CABG (2007), ICM, chronic systolic HF EF ~25%, HTN, DM2, PVD s/p R fem-pop (12/2012) complicated by necrotic anterior abdominal wall fascia and lateral soft tissues and CKD stage III-IV. Last myoview was 10/2011 which was negative for ischemia. EF 31% with old antero-apical MI   1. 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 Bones PTH 458.5   4. Dm Hb a1c 7.9   5. Access Placed AVF 2/23   Tolerated dialysis poorly with hypotension and inability to remove fluid. Discussed with the sons and grandchildren and also [name]. She clearly understands the situation and is aware that dialysis is not a going to be an option as long term outpatient therapy. She actually wants to go home and spend as much time with her family as possible. We discussed various scenarios including death from Chronic renal disease and the use of medications to maintain her symptoms. She agrees to palliative care and arrangements are to be made to transition care to home.      LOS: 15 [name] @TODAY @12 :40 PM

## 2013-07-01 NOTE — Progress Notes (Signed)
Advanced Heart Failure Team Progress Note  Referring Physician: Dr. Arrie Aran Primary Physician: Dr. Hale Bogus Primary Cardiologist:  [name]  Reason for Consultation: Heart Failure   HPI:    [name] [name] is a 68 yo female with a history of CAD s/p CABG (2007), ICM, chronic systolic HF EF 15-20%, DM2, PVD admitted with progressive renal failure. RHC showed severe PAH and low output physiology.   RHC 06/24/13 RA = 16  RV = 65/7/17  PA = 70/34 (47)  PCW = 17  Fick cardiac output/index = 3.4/1.9  Thermo CO/CI = 2.9/1.7  PVR = 10 WU  FA sat = 95%  PA sat = 50%, 51%  FEV1 1.04L (52%) FVC    1.52L (58%) FEV1/FVC 68% FEF 25-75%  0.50L (27%)DLCO 29%  Tolerated HD poorly yesterday. Remains weak. Denies dyspnea.   Objective:    Vital Signs:   Temp:  [97.9 F (36.6 C)-98.7 F (37.1 C)] 97.9 F (36.6 C) (02/25 0558) Pulse Rate:  [51-108] 51 (02/25 0558) Resp:  [18-20] 20 (02/25 0558) BP: (95-116)/(56-64) 116/56 mmHg (02/25 0558) SpO2:  [97 %-100 %] 100 % (02/25 0558) Weight:  [72.394 kg (159 lb 9.6 oz)] 72.394 kg (159 lb 9.6 oz) (02/25 0558) Last BM Date: 06/28/13  Weight change: Filed Weights   06/29/13 1705 06/30/13 0500 07/01/13 0558  Weight: 72.1 kg (158 lb 15.2 oz) 71.351 kg (157 lb 4.8 oz) 72.394 kg (159 lb 9.6 oz)    Intake/Output:   Intake/Output Summary (Last 24 hours) at 07/01/13 0944 Last data filed at 07/01/13 0602  Gross per 24 hour  Intake    360 ml  Output    351 ml  Net      9 ml     Physical Exam: General:  Elderly. Lying in bed No resp difficulty HEENT: normal Neck: supple. JVP 10 . Carotids 2+ bilat; no bruits. No lymphadenopathy or thryomegaly appreciated. L temp HD cath R central line.  Cor: PMI nondisplaced. Regular rate & rhythm. No rubs, gallops or murmurs. Lungs: Course through out.  Abdomen: soft, nontender, + distended. No hepatosplenomegaly. No bruits or masses. Good bowel sounds. Extremities: no cyanosis, clubbing, rash, 2+ edema. L  forearm dressing CDI.  Neuro: alert & orientedx3, cranial nerves grossly intact. moves all 4 extremities w/o difficulty. Affect pleasant   Labs: Basic Metabolic Panel:  Recent Labs Lab 06/27/13 0438 06/28/13 0600 06/29/13 0500 06/30/13 0410 07/01/13 0350  NA 135* 134* 138 136* 135*  K 3.6* 3.3* 3.6* 4.3 4.3  CL 86* 85* 89* 90* 89*  CO2 31 31 32 29 29  GLUCOSE 185* 163* 181* 163* 161*  BUN 93* 97* 99* 59* 66*  CREATININE 3.89* 4.07* 3.92* 2.98* 3.80*  CALCIUM 8.8 8.9 9.1 9.2 9.0  PHOS 8.1* 8.5* 8.1* 7.0* 8.6*    Liver Function Tests:  Recent Labs Lab 06/27/13 0438 06/28/13 0600 06/29/13 0500 06/30/13 0410 07/01/13 0350  ALBUMIN 2.8* 2.7* 2.7* 2.7* 2.6*   No results found for this basename: LIPASE, AMYLASE,  in the last 168 hours No results found for this basename: AMMONIA,  in the last 168 hours  CBC:  Recent Labs Lab 06/24/13 1455 06/26/13 1200 06/27/13 0438 06/29/13 0500  WBC 6.2 8.1 7.7 7.7  HGB 10.8* 10.9* 10.4* 10.0*  HCT 33.1* 32.6* 32.1* 31.1*  MCV 83.6 82.3 82.9 83.4  PLT 117* 109* 102* 129*    Cardiac Enzymes: No results found for this basename: CKTOTAL, CKMB, CKMBINDEX, TROPONINI,  in the last 168 hours  BNP: BNP (last 3 results)  Recent Labs  09/25/12 1743 09/29/12 0530  PROBNP 16961.0* 5247.0*    CBG:  Recent Labs Lab 06/29/13 2044 06/30/13 0635 06/30/13 1126 06/30/13 1640 06/30/13 2035  GLUCAP 121* 226* 175* 201* 139*    Coagulation Studies: No results found for this basename: LABPROT, INR,  in the last 72 hours   Imaging: Dg Chest Port 1 View  06/29/2013   CLINICAL DATA:  Postoperative film, status post line placement.  EXAM: PORTABLE CHEST - 1 VIEW  COMPARISON:  DG CHEST 2 VIEW dated 06/28/2013  FINDINGS: Since the previous study there has been interval placement of a large caliber dual lumen catheter via the left internal jugular approach. The tip of this catheter lies in the region of the right atrium. The right  internal jugular venous catheter tip lies in the midportion of the SVC. The cardiopericardial silhouette remains enlarged. Its borders are less distinct today. The pulmonary interstitial markings remain mildly increased. The left hemidiaphragm is partially obscured. No definite pleural effusions are demonstrated. The patient has undergone previous CABG.  IMPRESSION: 1. There has been interval placement of a dual-lumen, large caliber catheter via the left internal jugular approach with the tip of the distal port lying in the region of the right atrium. 2. The pulmonary interstitial markings are somewhat more conspicuous today which may reflect interstitial edema or pneumonia. 3. The cardiopericardial silhouette remains enlarged and the pulmonary vascularity remains engorged centrally consistent with mild CHF.   Electronically Signed   By: David  [place]   On: 06/29/2013 11:33     Medications:     Current Medications: . atorvastatin  80 mg Oral q1800  . budesonide-formoterol  2 puff Inhalation BID  . calcitRIOL  0.25 mcg Oral Daily  . calcium acetate  1,334 mg Oral TID WC  . clopidogrel  75 mg Oral Q breakfast  . DULoxetine  60 mg Oral Daily  . heparin  5,000 Units Subcutaneous 3 times per day  . insulin aspart  0-9 Units Subcutaneous TID WC  . ketoconazole   Topical BID  . pantoprazole  40 mg Oral Daily  . polyethylene glycol  17 g Oral Daily  . pregabalin  50 mg Oral BID  . senna  2 tablet Oral Daily  . sildenafil  20 mg Oral TID  . sodium chloride  3 mL Intravenous Q12H  . torsemide  40 mg Oral BID    Infusions: . milrinone 0.125 mcg/kg/min (07/01/13 0037)     Assessment:   1) A/C systolic HF - EF 16-10%15-20% (2013) 2) Acute on chronic renal failure, stage IV 3) DM2 4) HTN 5) PVD - R fem-pop (12/2012) 6) CAD - s/p CABG 2007  Plan/Discussion:    She remains quite tenuous. Not able to tolerate 1st HD session well.  I have had long talk with her and her daughter this am. Both  understand that her time is limited (I said weeks to months). [name]. [name] [name] would love to see her granddaughter graduate from college in mid May. That said, she realizes it might not happen.   They are willing to consider Hospice but would like to try HD one more time to make sure we have tried all we can do. I think this is very reasonable although I told them her heart would not likely tolerate well.   [name]. [name] [name] is interested in home Hospice and has said she would not want CPR or intubation but cardioversion and inotropes are OK,  if needed.   Arvilla Meres MD  9:44 AM

## 2013-07-01 NOTE — Progress Notes (Signed)
PT Cancellation Note and Discharge  Patient Details Name: [name] MRN: [id number] DOB: Sep 23, 1945   Cancelled Treatment:    Reason Eval/Treat Not Completed: Other (comment). Per RN pt now on palliative care with poor prognosis, suspecting weeks. Plans for d/c home with palliative care. Pt now DNR. Pt with no further acute PT skilled needs at this time. Please re-consult if needed in future.   Marcene Brawn 07/01/2013, 8:52 AM

## 2013-07-01 NOTE — Progress Notes (Signed)
Notified by [facility], patient and family request services of [facility] after discharge.  Spoke with patient at bedside and daughter [name] via phone [phone]) to  initiate education related to hospice services, philosophy and team approach to care daughter and pt voiced good understanding of information provided. Per discussion family wants equipment in place and, for safety, would like pt transported by [facility];  pt voiced she had long discussion with Dr Hyman Hopes she does not want any more HD treatments, wants to be comfortable pt and daughter aware Milrinone to be stopped and CVC to be removed prior to d/c; pt stated 'I want to be comfortable' Noted UE tremors and itching on this visit - spoke to [facility] staff RN who will follow-up with attending physician  After discussion with daughter who has also spoken with PMT NP, planning for d/c tomorrow or Friday.  DME has been requested for delivery to the home later today: Complete Oxygen Package B- pt currently on 3LNC continuous;Complete Package C: [id number];Tub seat with back; Transfer bench; wheelchair and add over-bed table; pt declines hospital bed at this time  Initial paperwork faxed to [facility]  Completed d/c summary will need to be faxed to [facility] @ [phone] when final  *Please notify [facility] when patient is ready to leave unit at d/c call [phone] (or [phone] if after 5 pm);  [facility] information left at bedside for daughter when she arrives at hospital and contact numbers also given to [place]   during phone discussion.   Above information shared with Raynelle Fanning [facility] Please call with any questions or concerns   Valente David, RN 07/01/2013, 5:01 PM [facility] Liaison [phone]

## 2013-07-01 NOTE — Discharge Summary (Addendum)
Physician Discharge Summary  [name]:[id number] DOB: August 20, 1945 DOA: 06/16/2013  PCP: Heidi Josephs, MD  Admit date: 06/16/2013 Discharge date: 07/01/2013  Time spent: 50 minutes  Recommendations for Outpatient Follow-up:  Home with [facility]  Discharge Diagnoses:    Acute on chronic systolic CHF EF 15%   Cardiorenal syndrome   HTN (hypertension)   PVD (peripheral vascular disease) with claudication   Cellulitis   Acute renal failure   Chronic kidney disease (CKD), stage IV (severe)   Acute on chronic renal failure   Pulmonary hypertension   H/o CVA   H/o CABG   Tobacco use disorder   Suspected COPD   Palliative care encounter   Discharge Condition: guarded  Diet recommendation: low sodium  Filed Weights   06/29/13 1705 06/30/13 0500 07/01/13 0558  Weight: 72.1 kg (158 lb 15.2 oz) 71.351 kg (157 lb 4.8 oz) 72.394 kg (159 lb 9.6 oz)    History of present illness:  History of Present Illness:This is a 68 y.o. year old female with significant prior vascular history including peripheral vascular disease status post femoropopliteal bypass with claudication, coronary artery disease status post multiple MIs status post CABG, CVA, type 2 diabetes, chronic renal insufficiency with a baseline creatinine of 2.7 presenting with cellulitis and acute kidney injury. Per the patient she noticed a small on the left anterior shin about 7-10 days ago. Of note, patient had a very extended hospitalization for essentially the entire month of October for nonhealing right groin wound. Please see discharge summary for full details. Patient denies any known trauma prior to wound. Area became progressively red and irritated. Some subjective fevers and chills at home. However, tib-fib was indicative of soft tissue swelling consistent with cellulitis. Per patient, she'll slightly marked increase in her serum creatinine there warranted the patient coming to the ER since possible.  In  the ER, Noted creatinine 3.7 as was BUN of 51. K @ 3.6.  Hospital Course:  Acute on chronic renal failure (CKD4)  -Likely due to acute infection and a degree of cardiorenal syndrome  -Creatinine last year was ~2 in October 2014  -Seen by Nephrology in consultation  -06/29/13 s/p-L-IJ PermCath and -L-wrist AVF  -first HD on 06/29/13--could not tolerate due to low BP /poor cardiac output -case discussed with Dr. Margrett Rud prognosis--no further HD, plans for comfort care -plans for Home with hospice  Acute on Chronic systolic and diastolic congestive heart failure  -EF 15-20%  -started on IV diuretics, switched to oral torsemide 40mg  bid on 06/26/13  -06/29/13--pt had worsen hypoxemia with pulm edema-->attempted HD  -Was followed by Dr. Gala Romney  -right heart catheterization--markedly elevated right heart pressures with moderate pulmonary hypertension--PAP=70/37  -V/Q scan--very low probability  -SPEP--no M-Spike  -case discussed with Dr. Gala Romney 06/30/13-->poor prognosis  -now plans for comfort care, Milrinone to be stopped when she discharges home with hospice  PAH  -Likely a combination of COPD and left heart failure  -PFTs---FEV1=52% suggests COPD  -V/Q scan--very low probability  -sildenafil was started 06/24/13   Goals of Care  -palliative medicine following, plans for Home with Hospice, DNR   Intermitten confusion/Delirium  -multifactorial including but not limited to the patient's acute on chronic renal failure, decompensated CHF, metabolic arrangements   Acute respiratory failure  -volume overload due to Renal failure and poor output CHF   Cellulitis:  -Discontinue antibiotics. Has finished 10 day course doxy 06/27/13   UTI  - E coli resistant to FQ- outpatient study.  Sensitive to rocephin.  -Finished 7 days of cefuroxime 06/25/2013   DM2  -HgbA1C- 7.9  -Patient had episodes of hypoglycemia due to poor po intake     Discharge Exam: Filed Vitals:   07/01/13  1405  BP: 107/56  Pulse: 110  Temp: 98.1 F (36.7 C)  Resp: 18    General: AAOx3 Cardiovascular: S1S2/RRR Respiratory: CTAB  Discharge Instructions  Discharge Orders   Future Appointments Provider Department Dept Phone   07/09/2013 3:00 PM [facility] [place] [facility] [address] [phone]   07/09/2013 3:30 PM [facility] [place] [facility] [address] [phone]   07/09/2013 4:00 PM Sherren Kerns, MD [facility] [phone]   Future Orders Complete By Expires   Diet - low sodium heart healthy  As directed    Increase activity slowly  As directed        Medication List    STOP taking these medications       ciprofloxacin 750 MG tablet  Commonly known as:  CIPRO     insulin lispro 100 UNIT/ML injection  Commonly known as:  HUMALOG     nitroGLYCERIN 0.4 MG SL tablet  Commonly known as:  NITROSTAT      TAKE these medications       albuterol 108 (90 BASE) MCG/ACT inhaler  Commonly known as:  PROVENTIL HFA;VENTOLIN HFA  Inhale 2 puffs into the lungs every 6 (six) hours as needed for wheezing or shortness of breath.     ALPRAZolam 0.5 MG tablet  Commonly known as:  XANAX  Take 0.5 mg by mouth daily as needed for anxiety.     ALPRAZolam 0.5 MG tablet  Commonly known as:  XANAX  Take 1 tablet (0.5 mg total) by mouth every 4 (four) hours as needed for anxiety.     budesonide-formoterol 160-4.5 MCG/ACT inhaler  Commonly known as:  SYMBICORT  Inhale 2 puffs into the lungs 2 (two) times daily.     clopidogrel 75 MG tablet  Commonly known as:  PLAVIX  Take 75 mg by mouth daily with breakfast.     diphenhydrAMINE 25 mg capsule  Commonly known as:  BENADRYL  Take 25 mg by mouth every 6 (six) hours as needed for allergies.     DULoxetine 60 MG capsule  Commonly known as:  CYMBALTA  Take 60 mg by mouth daily.     HYDROcodone-acetaminophen 5-325 MG per tablet  Commonly known as:  NORCO  Take 1-2 tablets every  6 hrs. / prn / pain     insulin glargine 100 UNIT/ML injection  Commonly known as:  LANTUS  Inject 28 Units into the skin at bedtime.     pantoprazole 40 MG tablet  Commonly known as:  PROTONIX  Take 40 mg by mouth daily.     pregabalin 50 MG capsule  Commonly known as:  LYRICA  Take 50 mg by mouth 2 (two) times daily.     rosuvastatin 40 MG tablet  Commonly known as:  CRESTOR  Take 40 mg by mouth daily.     senna 8.6 MG Tabs tablet  Commonly known as:  SENOKOT  Take 2 tablets (17.2 mg total) by mouth daily.     torsemide 20 MG tablet  Commonly known as:  DEMADEX  Take 2 tablets (40 mg total) by mouth daily. 2 tab daily by mouth       Allergies  Allergen Reactions  . Sulfa Antibiotics Anaphylaxis and Shortness Of Breath  .  Codeine Nausea Only and Other (See Comments)    Severe headache  . Ivp Dye [Iodinated Diagnostic Agents] Hives and Other (See Comments)    Severe anxiety  . Penicillins Swelling and Other (See Comments)    "huge sores". Pt has tolerated both Zosyn and Keflex in the past.      The results of significant diagnostics from this hospitalization (including imaging, microbiology, ancillary and laboratory) are listed below for reference.    Significant Diagnostic Studies: Dg Chest 2 View  06/28/2013   CLINICAL DATA:  CHF, dyspnea, history bronchitis, diabetes, hypertension, coronary artery disease post CABG  EXAM: CHEST  2 VIEW  COMPARISON:  06/24/2013  FINDINGS: Enlargement of cardiac silhouette post CABG.  Right jugular central venous catheter with tip projecting over SVC.  Atherosclerotic calcification aorta.  Mediastinal contour stable.  Improved pulmonary edema and left basilar opacity since previous exam.  No gross pleural effusion or pneumothorax.  Bones demineralized.  IMPRESSION: Enlargement of cardiac silhouette post CABG.  Improved pulmonary edema and left basilar opacity since previous study.   Electronically Signed   By: Ulyses Southward M.D.   On:  06/28/2013 18:52   Dg Chest 2 View  06/18/2013   CLINICAL DATA:  Shortness of breath, history of CHF  EXAM: CHEST  2 VIEW  COMPARISON:  DG CHEST 1V PORT dated 02/11/2013  FINDINGS: Low lung volumes. Cardiac silhouette is enlarged. Patient is status post median sternotomy coronary artery bypass grafting. There is diffuse prominence of the interstitial markings and mild peribronchial cuffing. Areas of increased density projects within the lung bases. No focal regions of consolidation appreciated. There is blunting of the costophrenic angles. The osseous structures unremarkable.  IMPRESSION: Interstitial findings likely representing pulmonary edema. A component of chronic bronchitic changes also a diagnostic consideration. Bibasilar infiltrate versus atelectasis. Surveillance evaluation recommended.   Electronically Signed   By: [name] M.D.   On: 06/18/2013 18:16   Dg Tibia/fibula Left  06/16/2013   CLINICAL DATA:  68 year old female with lower extremity soft tissue injury, abrasions/burning, infected. Initial encounter.  EXAM: LEFT TIBIA AND FIBULA - 2 VIEW  COMPARISON:  Left lower extremity venous ultrasound from the same day reported separately.  FINDINGS: Small medial surgical clips near the level of the knee joint. Evidence of widespread subcutaneous stranding and increased density at the level of the tib-fib. Questionable increased striated pattern of muscles in the calf. No subcutaneous gas. Calcified atherosclerosis. No radiopaque foreign body identified.  No acute fracture or dislocation identified in the tibia or fibula. Bulky chronic degenerative spurring/heterotopic ossification at the tibial tuberosity. No cortical osteolysis identified.  IMPRESSION: 1. Increased soft tissue density compatible with cellulitis in this clinical setting. No subcutaneous gas or retained foreign body identified. 2. No acute osseous abnormality identified. Advanced chronic posttraumatic / degenerative changes at the  tibial tuberosity.   Electronically Signed   By: Augusto Gamble M.D.   On: 06/16/2013 15:12   US Renal  06/18/2013   CLINICAL DATA:  An elevated creatinine. History of chronic renal disease  EXAM: RENAL/URINARY TRACT ULTRASOUND COMPLETE  COMPARISON:  DG ABD PORTABLE 1V dated 02/12/2013; US RENAL dated 02/09/2013  FINDINGS: Right Kidney:  Length: 9.1 cm. There is decreased corticomedullary differentiation. A small benign-appearing 0.7 x 0.4 x 0.7 cm cyst is identified within the midpole periphery the right kidney. There is no evidence of solid-appearing masses, hydronephrosis nor calculi.  Left Kidney:  Length: 11.9 cm. There is decreased corticomedullary differentiation. There is no evidence of  hydronephrosis, solid or cystic masses, nor calculi.  Bladder:  Urinary bladder is decompressed.  IMPRESSION: Increased cortical echogenicity bilaterally. Cysts consistent with patient's history of chronic renal disease. There is no evidence of obstructive uropathy no calculi. Small benign-appearing cyst is appreciated within the midpole region right kidney.   Electronically Signed   By: [name] M.D.   On: 06/18/2013 18:29   Nm Pulmonary Perf And Vent  06/25/2013   CLINICAL DATA:  Pulmonary hypertension  EXAM: NUCLEAR MEDICINE VENTILATION - PERFUSION LUNG SCAN  TECHNIQUE: Ventilation images were obtained in multiple projections using inhaled aerosol technetium 99 M DTPA. Perfusion images were obtained in multiple projections after intravenous injection of Tc-73m MAA.  COMPARISON:  Chest radiograph 06/24/2013  RADIOPHARMACEUTICALS:  40.0 mCi Tc-64m DTPA aerosol and 6.0 mCi Tc-40m MAA  FINDINGS: Ventilation: Mild peripheral irregularity of ventilation.  Enlargement of cardiac silhouette with diminished ventilation in lingula and left lower lobe.  Mild central airway deposition of tracer.  Perfusion: Enlargement of cardiac silhouette.  Mild diminished perfusion to left lower lobe and lingula, less severe than ventilatory  abnormality.  Single small subsegmental perfusion defect in left lower lobe.  No other perfusion defects identified.  Chest radiograph significant for enlargement of cardiac silhouette post CABG and bibasilar airspace disease.  IMPRESSION: Very low probability for pulmonary embolism.   Electronically Signed   By: Ulyses Southward M.D.   On: 06/25/2013 11:45   US Venous Img Lower Unilateral Left  06/16/2013   CLINICAL DATA:  Soft tissue edema  EXAM: LEFT LOWER EXTREMITY VENOUS DUPLEX ULTRASOUND  TECHNIQUE: Gray-scale sonography with graded compression, as well as color Doppler and duplex ultrasound, were performed to evaluate the deep venous system from the level of the common femoral vein through the popliteal and proximal calf veins. Spectral Doppler was utilized to evaluate flow at rest and with distal augmentation maneuvers.  COMPARISON:  None.  FINDINGS: Flow in the venous structures of the left lower extremity is spontaneous and phasic in all segments. There is normal compression and augmentation in the venous structures of the left lower extremity. Venous Doppler signal is normal in all regions. There is no thrombus in the deep or visualized superficial venous structures on the left. There is no deep venous incompetence. There is soft tissue edema which is causing some diminished visualization of superficial venous structures in the thigh and calf regions.  IMPRESSION: No evidence of left lower extremity deep venous thrombosis. Moderate soft tissue edema.   Electronically Signed   By: Bretta Bang M.D.   On: 06/16/2013 14:30   Dg Chest Port 1 View  06/29/2013   CLINICAL DATA:  Postoperative film, status post line placement.  EXAM: PORTABLE CHEST - 1 VIEW  COMPARISON:  DG CHEST 2 VIEW dated 06/28/2013  FINDINGS: Since the previous study there has been interval placement of a large caliber dual lumen catheter via the left internal jugular approach. The tip of this catheter lies in the region of the right  atrium. The right internal jugular venous catheter tip lies in the midportion of the SVC. The cardiopericardial silhouette remains enlarged. Its borders are less distinct today. The pulmonary interstitial markings remain mildly increased. The left hemidiaphragm is partially obscured. No definite pleural effusions are demonstrated. The patient has undergone previous CABG.  IMPRESSION: 1. There has been interval placement of a dual-lumen, large caliber catheter via the left internal jugular approach with the tip of the distal port lying in the region of the right atrium.  2. The pulmonary interstitial markings are somewhat more conspicuous today which may reflect interstitial edema or pneumonia. 3. The cardiopericardial silhouette remains enlarged and the pulmonary vascularity remains engorged centrally consistent with mild CHF.   Electronically Signed   By: David  [place]   On: 06/29/2013 11:33   Dg Chest Port 1 View  06/24/2013   CLINICAL DATA:  Central line placement  EXAM: PORTABLE CHEST - 1 VIEW  COMPARISON:  DG CHEST 2 VIEW dated 06/18/2013  FINDINGS: There is interval placement of a right central venous line with tip in distal SVC. No pneumothorax. There is bilateral mild airspace disease in the lung bases. There is small effusions.  IMPRESSION: 1. Right central venous line in good position without complication. 2. Increase in bibasilar airspace disease suggests pulmonary edema.   Electronically Signed   By: Genevive Bi M.D.   On: 06/24/2013 18:59    Microbiology: Recent Results (from the past 240 hour(s))  MRSA PCR SCREENING     Status: None   Collection Time    06/28/13 10:18 PM      Result Value Ref Range Status   MRSA by PCR NEGATIVE  NEGATIVE Final   Comment:            The GeneXpert MRSA Assay (FDA     approved for NASAL specimens     only), is one component of a     comprehensive MRSA colonization     surveillance program. It is not     intended to diagnose MRSA     infection nor to  guide or     monitor treatment for     MRSA infections.  URINE CULTURE     Status: None   Collection Time    06/29/13  2:09 AM      Result Value Ref Range Status   Specimen Description URINE, CATHETERIZED   Final   Special Requests NONE   Final   Culture  Setup Time     Final   Value: 06/29/2013 04:53     Performed at [facility]   Colony Count     Final   Value: NO GROWTH     Performed at [facility]   Culture     Final   Value: NO GROWTH     Performed at [facility]   Report Status 06/30/2013 FINAL   Final     Labs: Basic Metabolic Panel:  Recent Labs Lab 06/27/13 0438 06/28/13 0600 06/29/13 0500 06/30/13 0410 07/01/13 0350  NA 135* 134* 138 136* 135*  K 3.6* 3.3* 3.6* 4.3 4.3  CL 86* 85* 89* 90* 89*  CO2 31 31 32 29 29  GLUCOSE 185* 163* 181* 163* 161*  BUN 93* 97* 99* 59* 66*  CREATININE 3.89* 4.07* 3.92* 2.98* 3.80*  CALCIUM 8.8 8.9 9.1 9.2 9.0  PHOS 8.1* 8.5* 8.1* 7.0* 8.6*   Liver Function Tests:  Recent Labs Lab 06/27/13 0438 06/28/13 0600 06/29/13 0500 06/30/13 0410 07/01/13 0350  ALBUMIN 2.8* 2.7* 2.7* 2.7* 2.6*   No results found for this basename: LIPASE, AMYLASE,  in the last 168 hours No results found for this basename: AMMONIA,  in the last 168 hours CBC:  Recent Labs Lab 06/26/13 1200 06/27/13 0438 06/29/13 0500  WBC 8.1 7.7 7.7  HGB 10.9* 10.4* 10.0*  HCT 32.6* 32.1* 31.1*  MCV 82.3 82.9 83.4  PLT 109* 102* 129*   Cardiac Enzymes: No results found for this basename: CKTOTAL, CKMB,  CKMBINDEX, TROPONINI,  in the last 168 hours BNP: BNP (last 3 results)  Recent Labs  09/25/12 1743 09/29/12 0530  PROBNP 16961.0* 5247.0*   CBG:  Recent Labs Lab 06/30/13 1126 06/30/13 1640 06/30/13 2035 07/01/13 0816 07/01/13 1127  GLUCAP 175* 201* 139* 208* 146*       Signed:  Cobi Delph  [facility] 07/01/2013, 5:16 PM

## 2013-07-01 NOTE — Care Management Note (Addendum)
Page 1 of 2   07/02/2013     3:40:10 PM   CARE MANAGEMENT NOTE 07/02/2013  Patient:  [name], [name]   Account Number:  [id number]  Date Initiated:  06/17/2013  Documentation initiated by:  Lizabeth Leyden  Subjective/Objective Assessment:   admitted with cellulits left lower leg  medical hx: INPT 02/06/2013 - 02/27/2013 right groin infection, [facility]     Action/Plan:   progression of care and discharge planning   Anticipated DC Date:  07/01/2013   Anticipated DC Plan:  [place]  CM consult      PAC Choice  HOSPICE   Choice offered to / List presented to:  C-1 Patient   DME arranged  OTHER - SEE COMMENT      DME agency  [place] arranged  HH-1 RN  [place] agency  [facility] [place]   Status of service:  Completed, signed off Medicare Important Message given?   (If response is "NO", the following Medicare IM given date fields will be blank) Date Medicare IM given:   Date Additional Medicare IM given:    Discharge Disposition:  [place]  Per UR Regulation:  Reviewed for med. necessity/level of care/duration of stay  If discussed at [place] of Stay Meetings, dates discussed:   06/23/2013  06/30/2013  07/02/2013    Comments:  07/02/13 Orien Mayhall,RN,BSN [phone] 1PM CONFIRMED WITH Heidi, PT'S DAUGHTER, THAT ALL DME HAS BEEN DELIVERED TO PT'S HOME.  SPOKE WITH [name] AT [facility], AND THEY HAVE ARRANGED FOR NURSE TO ADMIT PT AROUND 4PM TODAY. NOTIFIED ATTENDING, AND PT'S NURSE TO [place]. [facility] CONTACTED TO SCHEDULE PICKUP AROUND 2:45PM.  SON AT BEDSIDE, UPDATED.  YELLOW DNR FORM, TRANSPORT FORM AND EMS PACKET READY ON CHART.  07/02/13 Evelyna Folker,RN,BSN [phone] EQUIPMENT WAS NOT DELIVERED LAST PM, BUT CAN BE DELIVERED TODAY BEFORE 1PM, PER [facility].  STILL PLANNING DC HOME TODAY WITH FAMILY  AND THEY ARE PREPARED FOR HER TO COME HOME. HOSPICE OFFICE NOT OPENING TODAY UNTIL 12 PM DUE TO INCLEMENT WEATHER.  WILL CALL WHEN OPEN.  07/01/13 Heidi Howland,RN,BSN [phone] MAIN CAREGIVER [name] NOT READY FOR PT TO COME HOME TODAY, AND ALL ARRANGEMENTS ARE NOT FINALIZED WITH DME AND HOSPICE CAREGIVERS.  FAMILY NOW PREFERS PT TO DC HOME ON THURS OR FRI, WEATHER PERMITTING.  UPDATED [facility] OF CHANGE IN DC DATE, THOUGH PLAN TO STILL DELIVER DME TODAY, IF POSSIBLE.  WILL NEED YELLOW OUT OF FACILITY DNR FORM COMPLETED BY MD.  07/01/13 Heidi Lerner,RN,BSN [phone] PT GIVEN TERMINAL PROGNOSIS; PT AND FAMILY DESIRING DC HOME WITH [facility] AND FAMILY ASAP.  MET WITH PT AND FAMILY TO DISCUSS THIS.  PT TO DC TO HER HOME WITH CARE OF MULTIPLE FAMILY MEMBERS WHO LIVE IN THE HOME.  MAIN CONTACT IS [name], DAUGHTER (PHONE [phone]).    SPOKE WITH MARGIE, [place] FOR [facility], AND SHE WILL SEE PT THIS AFTERNOON.  PER MARGIE'S REQUEST, CALLED REFERRAL CENTER AT [facility] [phone]) AND SPOKE WITH [name].  PT/FAMILY REQUEST DR Hassell Done [facility]BB TO FOLLOW PT FOR HOSPICE ORDERS, AND HE HAS GRACIOUSLY AGREED.  PT WILL REQUIRE HOSPICE PACKAGE B AND C, AS SHE NEEDS HOME O2, WHEELCHAIR, BSC.  DECLINES HOSPITAL BED AND HAS RW FOR HOME.  SHE  IS CURRENTLY ON 3LITERS OF O2 CONTINUOUSLY.  PER [name] WITH HOSPICE, CURRENTLY ATTEMPTING TO FIND STAFF TO SEE PT TODAY, BUT MAY BE ABLE TO STAFF BY TOMORROW AFTERNOON, WEATHER PERMITTING. PT/FAMILY ARE AGREEABLE TO THIS, AND UNDERSTAND THAT STAFF MAY NOT BE OUT UNTIL EVEN FRIDAY, DEPENDING ON THE WEATHER. THEY FEEL THAT PT HAS PLENTY OF FAMILY SUPPORT, AND CAN WAIT A DAY OR TWO, IF NEEDED.  PT  REQUESTING TO DC BY CAR, BUT SON [name] FEELS EMS WOULD BE BETTER, AND THIS CASE MANAGER AGREES.  SPOKE WITH JERMAINE WITH [facility] REGARDING DME DELIVERY; HE WILL GIVE ME A ETA ON EQUIPMENT DELIVERY. ONCE KNOWN, WE CAN ARRANGE [facility] TRANSPORT.  06/23/2013  [name], [place] [phone] Cellulitis  improved, decreased renal function CR  4.03, monitor renal status, ? need for HD.

## 2013-07-01 NOTE — Progress Notes (Addendum)
Progress Note from the Palliative Medicine Team at [facility]  Subjective: [name] is alert and able to communicate with me today. I spoke with Dr. Jomarie Longs earlier, and the patient confirms, that they would like to go home with hospice now. She is tearful and realistic and I spoke with her daughter [name] yesterday about her life being limited to weeks possibly without dialysis (and unable to tolerate related to CHF). I followed up with daughter [name] via telephone and she is concerned about discharge home if hospice is not able to see patient until Friday and is requesting discharge tomorrow. [name] says she spoke with her mother about waiting until tomorrow and they agreed. [name] and [name] have spoken with her providers here and understand her poor prognosis and that dialysis is no longer an option. [name] has many family members supporting and in the room when I visit. Milrinone and CVC will be d/c prior to discharge. The focus will be on her comfort and [name] is anxious to get home and see her grandson (she smiles while she speaks of him) and says this gives her pleasure and she wants to spend her time at home with her family.     Objective: Allergies  Allergen Reactions  . Sulfa Antibiotics Anaphylaxis and Shortness Of Breath  . Codeine Nausea Only and Other (See Comments)    Severe headache  . Ivp Dye [Iodinated Diagnostic Agents] Hives and Other (See Comments)    Severe anxiety  . Penicillins Swelling and Other (See Comments)    "huge sores". Pt has tolerated both Zosyn and Keflex in the past.   Scheduled Meds: . atorvastatin  80 mg Oral q1800  . budesonide-formoterol  2 puff Inhalation BID  . calcitRIOL  0.25 mcg Oral Daily  . calcium acetate  1,334 mg Oral TID WC  . clopidogrel  75 mg Oral Q breakfast  . DULoxetine  60 mg Oral Daily  . heparin  5,000 Units Subcutaneous 3 times per day  . insulin aspart  0-9 Units Subcutaneous TID WC  . ketoconazole   Topical  BID  . pantoprazole  40 mg Oral Daily  . polyethylene glycol  17 g Oral Daily  . pregabalin  50 mg Oral BID  . senna  2 tablet Oral Daily  . sildenafil  20 mg Oral TID  . sodium chloride  3 mL Intravenous Q12H  . torsemide  40 mg Oral BID   Continuous Infusions: . milrinone 0.125 mcg/kg/min (07/01/13 0037)   PRN Meds:.sodium chloride, albuterol, ALPRAZolam, bisacodyl, HYDROcodone-acetaminophen, sodium chloride  BP 116/56  Pulse 51  Temp(Src) 97.9 F (36.6 C) (Oral)  Resp 20  Ht 5' (1.524 m)  Wt 72.394 kg (159 lb 9.6 oz)  BMI 31.17 kg/m2  SpO2 100%   PPS: 30%  Pain Score: none   Intake/Output Summary (Last 24 hours) at 07/01/13 1235 Last data filed at 07/01/13 0800  Gross per 24 hour  Intake    600 ml  Output    351 ml  Net    249 ml      LBM: 07/01/13      Physical Exam:  General: NAD, alert, pleasant, ill appearing HEENT: [place]/AT, no icterus, dry mucous membranes  Chest: Crackles throughout, no labored breathes  CVS: RRR, S1 S2  Abdomen: Soft, NT, ND, +BS  Ext: 1+ BLE edema, warm to touch  Neuro: Alert and oriented x 4, follows commands  Labs: CBC    Component Value Date/Time  WBC 7.7 06/29/2013 0500   WBC 9.2 12/19/2012 1601   RBC 3.73* 06/29/2013 0500   RBC 4.66 12/19/2012 1601   HGB 10.0* 06/29/2013 0500   HGB 13.7 12/19/2012 1601   HCT 31.1* 06/29/2013 0500   HCT 42.9 12/19/2012 1601   PLT 129* 06/29/2013 0500   MCV 83.4 06/29/2013 0500   MCV 92.0 12/19/2012 1601   MCH 26.8 06/29/2013 0500   MCH 29.4 12/19/2012 1601   MCHC 32.2 06/29/2013 0500   MCHC 31.9 12/19/2012 1601   RDW 15.3 06/29/2013 0500   LYMPHSABS 1.6 06/16/2013 1915   MONOABS 0.4 06/16/2013 1915   EOSABS 0.2 06/16/2013 1915   BASOSABS 0.0 06/16/2013 1915    BMET    Component Value Date/Time   NA 135* 07/01/2013 0350   K 4.3 07/01/2013 0350   CL 89* 07/01/2013 0350   CO2 29 07/01/2013 0350   GLUCOSE 161* 07/01/2013 0350   BUN 66* 07/01/2013 0350   CREATININE 3.80* 07/01/2013 0350   CALCIUM 9.0  07/01/2013 0350   GFRNONAA 11* 07/01/2013 0350   GFRAA 13* 07/01/2013 0350    CMP     Component Value Date/Time   NA 135* 07/01/2013 0350   K 4.3 07/01/2013 0350   CL 89* 07/01/2013 0350   CO2 29 07/01/2013 0350   GLUCOSE 161* 07/01/2013 0350   BUN 66* 07/01/2013 0350   CREATININE 3.80* 07/01/2013 0350   CALCIUM 9.0 07/01/2013 0350   PROT 7.1 06/19/2013 0612   ALBUMIN 2.6* 07/01/2013 0350   AST 14 06/19/2013 0612   ALT 15 06/19/2013 0612   ALKPHOS 87 06/19/2013 0612   BILITOT 0.3 06/19/2013 0612   GFRNONAA 11* 07/01/2013 0350   GFRAA 13* 07/01/2013 0350     Assessment and Plan: 1. Code Status: Partial code: NO CPR and NO intubation 2. Symptom Control: 1. Pain: Continue Vicodin prn for now (she is having no pain currently). Consider Roxanol 5 mg q2h prn for pain/shortness of breath. 2. Anxiety: Xanax 0.5 mg q4h prn. 3. Sleep: Xanax 0.5 mg qhs prn.  4. Bowel Regimen: Senna S 2 tablet qhs prn.  3. Psycho/Social: Emotional support provided to patient and family. 4. Disposition: Home with hospice.    Time In Time Out Total Time Spent with Patient Total Overall Time  1150 1230 25min 40min    Greater than 50%  of this time was spent counseling and coordinating care related to the above assessment and plan.  Yong ChannelAlicia Sacramento Monds, NP Palliative Medicine Team Pager # [phone] (M-F 8a-5p) Team Phone # [phone] (Nights/Weekends)

## 2013-07-02 ENCOUNTER — Telehealth: Payer: Self-pay | Admitting: Vascular Surgery

## 2013-07-02 LAB — GLUCOSE, CAPILLARY: Glucose-Capillary: 178 mg/dL — ABNORMAL HIGH (ref 70–99)

## 2013-07-02 NOTE — Progress Notes (Signed)
Ms. Kirchner was sleeping and appeared to be resting comfortably so I did not awaken her. Plan is for home today with hospice support. I placed golden DNR on chart for discharge. Please let me know if I can help in any way or if Ms. Barzee has any further needs.  Yong Channel, NP Palliative Medicine Team Pager # [phone] (M-F 8a-5p) Team Phone # [phone] (Nights/Weekends)

## 2013-07-02 NOTE — Telephone Encounter (Addendum)
Message copied by Fredrich Birks on Thu Jul 02, 2013  2:38 PM ------      Message from: Gott Odor      Created: Mon Jun 29, 2013 11:20 AM      Regarding: [name]                   ----- Message -----         From: Larina Earthly, MD         Sent: 06/29/2013  10:35 AM           To: [facility] Charge Pool            Had a left internal jugular catheter SonoSite visualization      Had a left wrist Cimino AV fistula. Need to see her in the office in one month. Midwife. ------  07/02/13: lm for pt re appt, dpm

## 2013-07-09 ENCOUNTER — Encounter (HOSPITAL_COMMUNITY): Payer: Medicare Other

## 2013-07-09 ENCOUNTER — Ambulatory Visit: Payer: Medicare Other | Admitting: Vascular Surgery

## 2013-07-09 ENCOUNTER — Other Ambulatory Visit (HOSPITAL_COMMUNITY): Payer: Medicare Other

## 2013-07-21 NOTE — Consult Note (Signed)
I have reviewed and discussed the care of this patient in detail with the nurse practitioner including pertinent patient records, physical exam findings and data. I agree with details of this encounter.  

## 2013-07-29 ENCOUNTER — Other Ambulatory Visit: Payer: Self-pay | Admitting: *Deleted

## 2013-07-29 DIAGNOSIS — Z4931 Encounter for adequacy testing for hemodialysis: Secondary | ICD-10-CM

## 2013-07-29 DIAGNOSIS — N186 End stage renal disease: Secondary | ICD-10-CM

## 2013-08-05 ENCOUNTER — Encounter: Payer: Self-pay | Admitting: Vascular Surgery

## 2013-08-05 DEATH — deceased

## 2013-08-06 ENCOUNTER — Ambulatory Visit: Payer: Medicare Other | Admitting: Vascular Surgery

## 2013-08-06 ENCOUNTER — Inpatient Hospital Stay (HOSPITAL_COMMUNITY): Admit: 2013-08-06 | Payer: Medicare Other

## 2013-08-06 ENCOUNTER — Inpatient Hospital Stay (HOSPITAL_COMMUNITY): Admission: RE | Admit: 2013-08-06 | Payer: Medicare Other | Source: Ambulatory Visit

## 2014-04-15 ENCOUNTER — Encounter (HOSPITAL_COMMUNITY): Payer: Self-pay | Admitting: Vascular Surgery

## 2015-01-17 IMAGING — DX DG CHEST 1V PORT
1 series · 1 of 1 positions shown · non-contrast
Comparison: DG CHEST 2 VIEW dated 06/18/2013

CLINICAL DATA: Central line placement

EXAM:
PORTABLE CHEST - 1 VIEW

[portable]
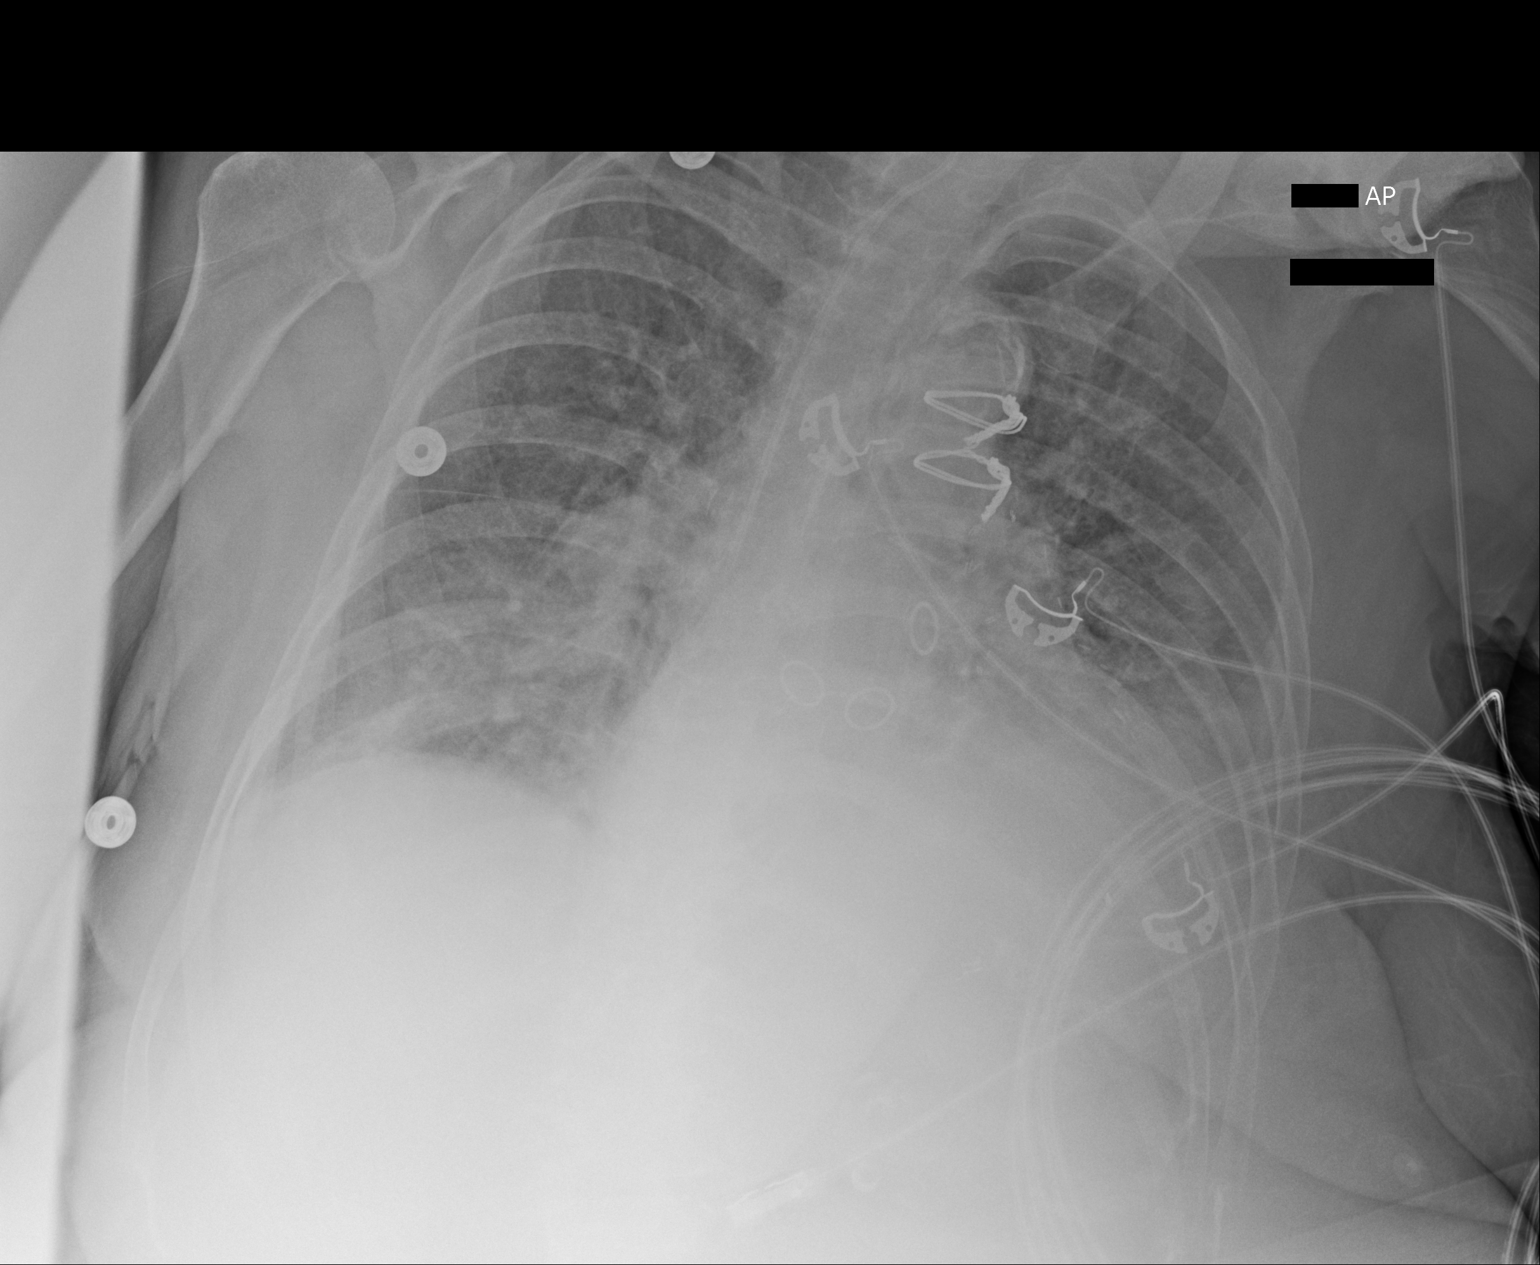

[1 of 1 positions shown; findings below may reference images not displayed]

FINDINGS: There is interval placement of a right central venous line with tip
in distal SVC. No pneumothorax. There is bilateral mild airspace
disease in the lung bases. There is small effusions.
IMPRESSION: 1. Right central venous line in good position without complication.
2. Increase in bibasilar airspace disease suggests pulmonary edema.

## 2015-01-21 IMAGING — CR DG CHEST 2V
2 series · 2 of 2 positions shown · non-contrast
Comparison: 06/24/2013

CLINICAL DATA: CHF, dyspnea, history bronchitis, diabetes,
hypertension, coronary artery disease post CABG

EXAM:
CHEST  2 VIEW

[w chest lat]
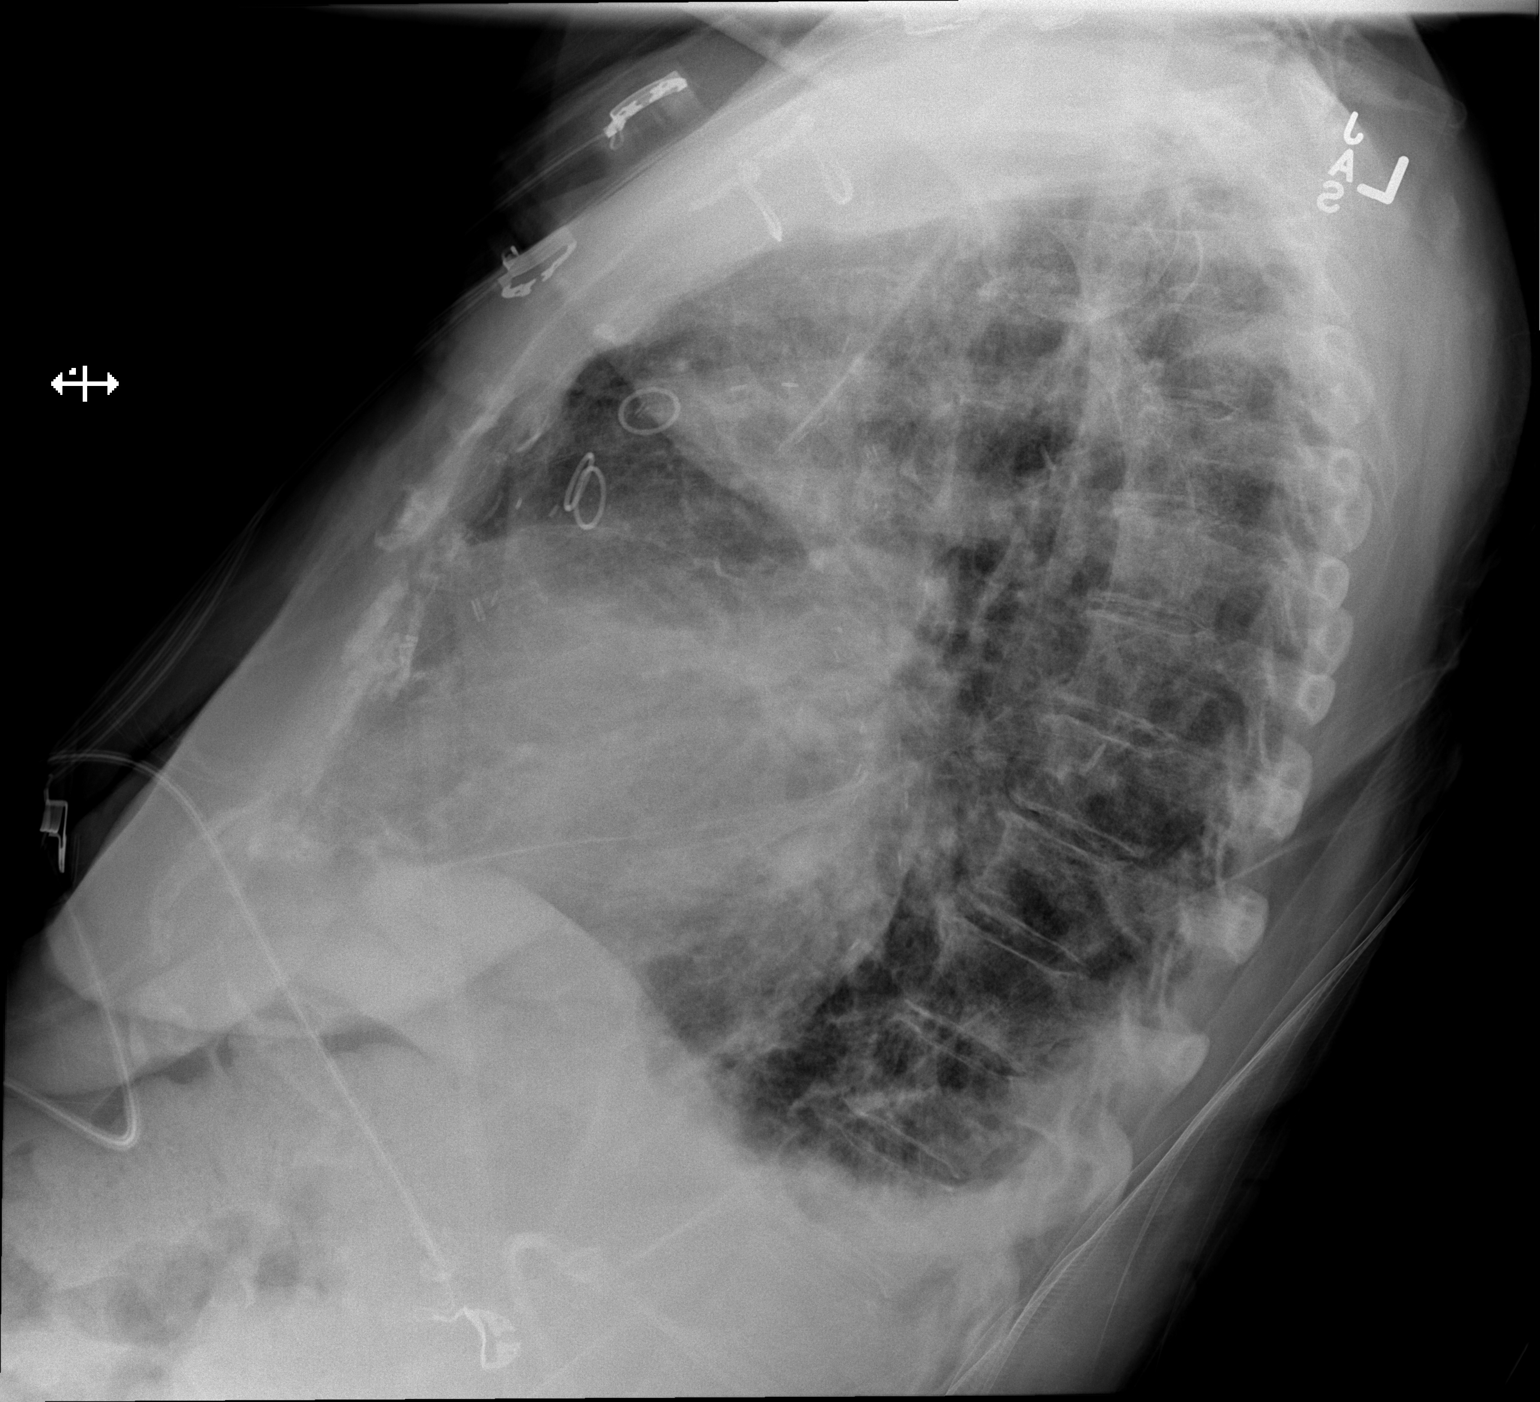

[x chest ap]
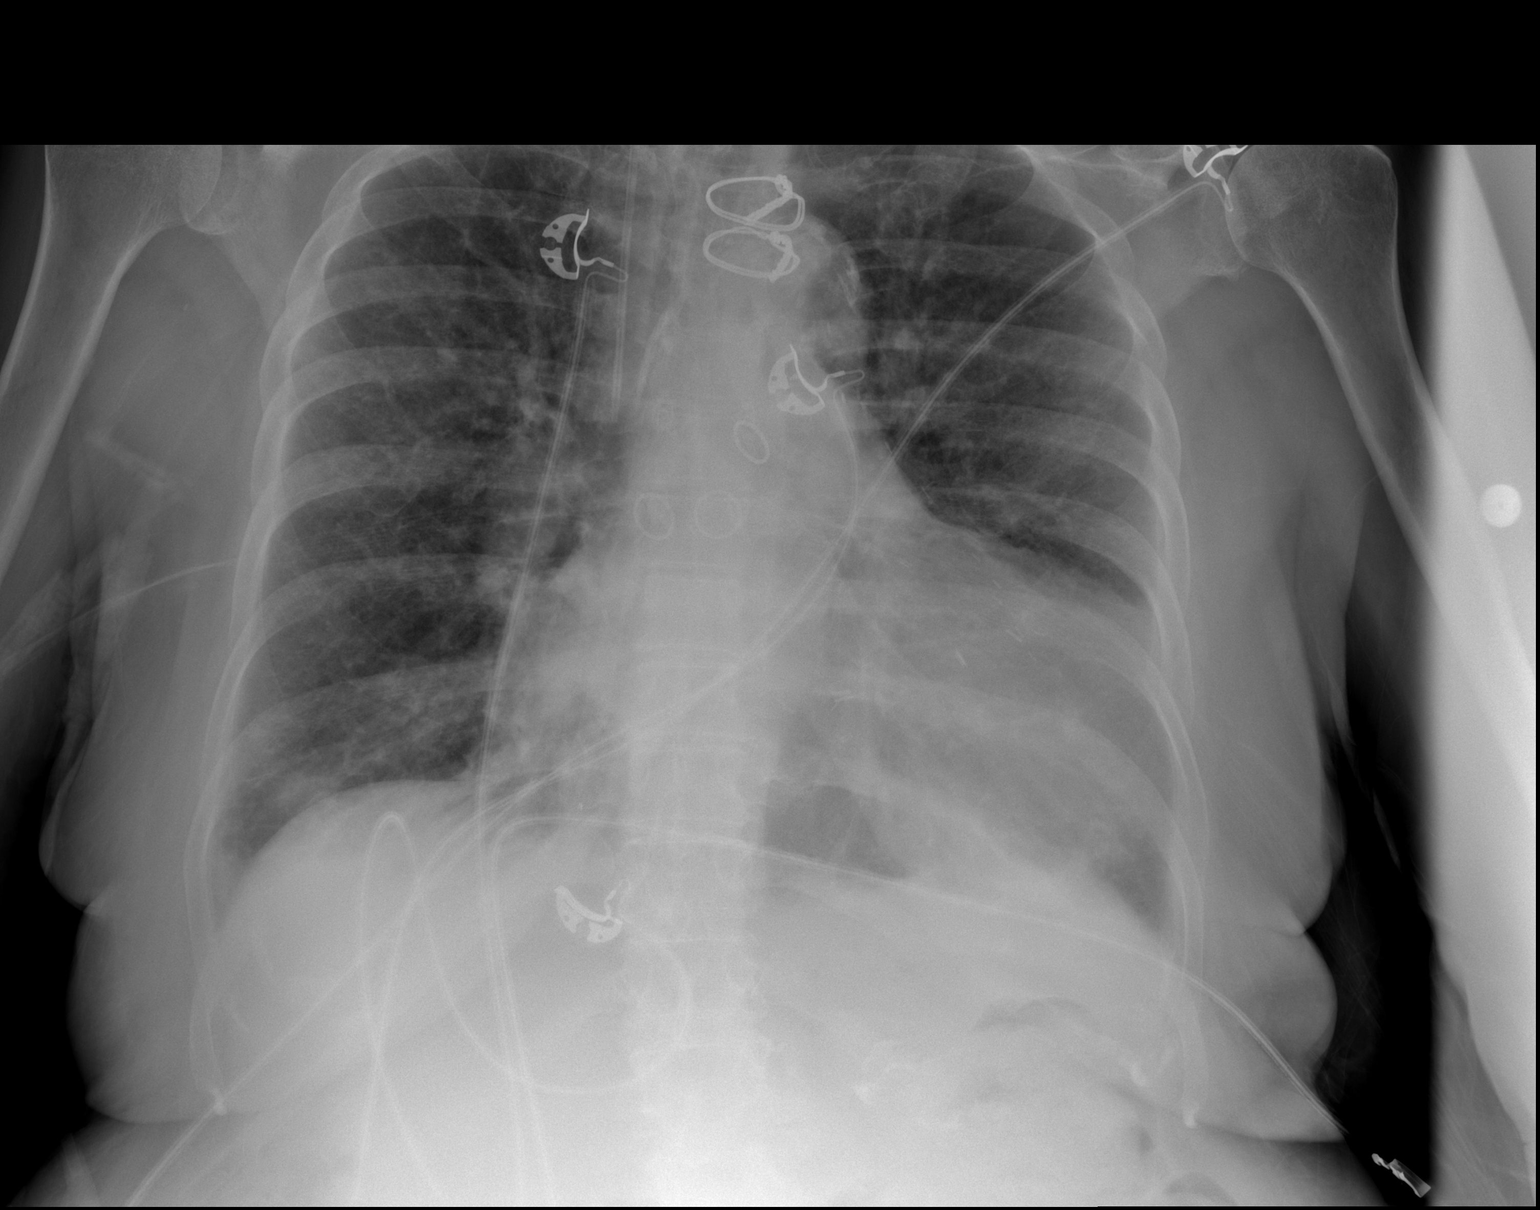

[2 of 2 positions shown; findings below may reference images not displayed]

FINDINGS: Enlargement of cardiac silhouette post CABG.

Right jugular central venous catheter with tip projecting over SVC.

Atherosclerotic calcification aorta.

Mediastinal contour stable.

Improved pulmonary edema and left basilar opacity since previous
exam.

No gross pleural effusion or pneumothorax.

Bones demineralized.
IMPRESSION: Enlargement of cardiac silhouette post CABG.

Improved pulmonary edema and left basilar opacity since previous
study.
